# Patient Record
Sex: Male | Born: 1945 | Race: White | Hispanic: No | Marital: Married | Smoking: Former smoker
Health system: Southern US, Community
[De-identification: ages and names within clinical notes are randomized; demographics above are authoritative.]

## PROBLEM LIST (undated history)

## (undated) DIAGNOSIS — IMO0002 Reserved for concepts with insufficient information to code with codable children: Secondary | ICD-10-CM

## (undated) DIAGNOSIS — H409 Unspecified glaucoma: Secondary | ICD-10-CM

## (undated) DIAGNOSIS — G4733 Obstructive sleep apnea (adult) (pediatric): Secondary | ICD-10-CM

## (undated) DIAGNOSIS — E119 Type 2 diabetes mellitus without complications: Secondary | ICD-10-CM

## (undated) DIAGNOSIS — Z87442 Personal history of urinary calculi: Secondary | ICD-10-CM

## (undated) DIAGNOSIS — R609 Edema, unspecified: Secondary | ICD-10-CM

## (undated) DIAGNOSIS — N529 Male erectile dysfunction, unspecified: Secondary | ICD-10-CM

## (undated) DIAGNOSIS — E785 Hyperlipidemia, unspecified: Secondary | ICD-10-CM

## (undated) DIAGNOSIS — E230 Hypopituitarism: Secondary | ICD-10-CM

## (undated) DIAGNOSIS — I872 Venous insufficiency (chronic) (peripheral): Secondary | ICD-10-CM

## (undated) DIAGNOSIS — Z8679 Personal history of other diseases of the circulatory system: Secondary | ICD-10-CM

## (undated) DIAGNOSIS — K7689 Other specified diseases of liver: Secondary | ICD-10-CM

## (undated) DIAGNOSIS — M47816 Spondylosis without myelopathy or radiculopathy, lumbar region: Secondary | ICD-10-CM

## (undated) DIAGNOSIS — K219 Gastro-esophageal reflux disease without esophagitis: Secondary | ICD-10-CM

## (undated) DIAGNOSIS — R197 Diarrhea, unspecified: Secondary | ICD-10-CM

## (undated) DIAGNOSIS — E1169 Type 2 diabetes mellitus with other specified complication: Secondary | ICD-10-CM

## (undated) DIAGNOSIS — I10 Essential (primary) hypertension: Secondary | ICD-10-CM

## (undated) DIAGNOSIS — M1990 Unspecified osteoarthritis, unspecified site: Secondary | ICD-10-CM

## (undated) DIAGNOSIS — J309 Allergic rhinitis, unspecified: Secondary | ICD-10-CM

## (undated) DIAGNOSIS — N400 Enlarged prostate without lower urinary tract symptoms: Secondary | ICD-10-CM

## (undated) HISTORY — DX: Obstructive sleep apnea (adult) (pediatric): G47.33

## (undated) HISTORY — DX: Essential (primary) hypertension: I10

## (undated) HISTORY — DX: Type 2 diabetes mellitus with other specified complication: E11.69

## (undated) HISTORY — DX: Diarrhea, unspecified: R19.7

## (undated) HISTORY — DX: Personal history of other diseases of the circulatory system: Z86.79

## (undated) HISTORY — DX: Unspecified glaucoma: H40.9

## (undated) HISTORY — DX: Male erectile dysfunction, unspecified: N52.9

## (undated) HISTORY — DX: Type 2 diabetes mellitus without complications: E11.9

## (undated) HISTORY — DX: Other specified diseases of liver: K76.89

## (undated) HISTORY — DX: Hypopituitarism: E23.0

## (undated) HISTORY — DX: Unspecified osteoarthritis, unspecified site: M19.90

## (undated) HISTORY — DX: Benign prostatic hyperplasia without lower urinary tract symptoms: N40.0

## (undated) HISTORY — DX: Hyperlipidemia, unspecified: E78.5

## (undated) HISTORY — DX: Allergic rhinitis, unspecified: J30.9

## (undated) HISTORY — DX: Reserved for concepts with insufficient information to code with codable children: IMO0002

## (undated) HISTORY — DX: Spondylosis without myelopathy or radiculopathy, lumbar region: M47.816

## (undated) HISTORY — DX: Gastro-esophageal reflux disease without esophagitis: K21.9

## (undated) HISTORY — DX: Personal history of urinary calculi: Z87.442

## (undated) HISTORY — DX: Venous insufficiency (chronic) (peripheral): I87.2

## (undated) HISTORY — DX: Morbid (severe) obesity due to excess calories: E66.01

## (undated) HISTORY — DX: Edema, unspecified: R60.9

---

## 1998-04-16 ENCOUNTER — Encounter: Admission: RE | Admit: 1998-04-16 | Discharge: 1998-07-15 | Payer: Self-pay | Admitting: Endocrinology

## 1999-02-20 ENCOUNTER — Ambulatory Visit: Admission: RE | Admit: 1999-02-20 | Discharge: 1999-02-20 | Payer: Self-pay | Admitting: Endocrinology

## 1999-07-02 ENCOUNTER — Ambulatory Visit: Admission: RE | Admit: 1999-07-02 | Discharge: 1999-07-02 | Payer: Self-pay | Admitting: Pulmonary Disease

## 2002-05-14 ENCOUNTER — Encounter: Admission: RE | Admit: 2002-05-14 | Discharge: 2002-08-12 | Payer: Self-pay | Admitting: Endocrinology

## 2002-09-17 ENCOUNTER — Encounter: Admission: RE | Admit: 2002-09-17 | Discharge: 2002-12-16 | Payer: Self-pay | Admitting: Endocrinology

## 2002-10-15 ENCOUNTER — Encounter (HOSPITAL_BASED_OUTPATIENT_CLINIC_OR_DEPARTMENT_OTHER): Admission: RE | Admit: 2002-10-15 | Discharge: 2003-01-13 | Payer: Self-pay | Admitting: Internal Medicine

## 2003-01-29 ENCOUNTER — Encounter (HOSPITAL_BASED_OUTPATIENT_CLINIC_OR_DEPARTMENT_OTHER): Admission: RE | Admit: 2003-01-29 | Discharge: 2003-04-29 | Payer: Self-pay | Admitting: Internal Medicine

## 2003-08-03 ENCOUNTER — Ambulatory Visit (HOSPITAL_COMMUNITY): Admission: RE | Admit: 2003-08-03 | Discharge: 2003-08-03 | Payer: Self-pay | Admitting: Endocrinology

## 2004-02-08 ENCOUNTER — Encounter
Admission: RE | Admit: 2004-02-08 | Discharge: 2004-04-20 | Payer: Self-pay | Admitting: Physical Medicine and Rehabilitation

## 2004-03-24 ENCOUNTER — Ambulatory Visit: Payer: Self-pay | Admitting: Physical Medicine and Rehabilitation

## 2004-04-05 ENCOUNTER — Encounter
Admission: RE | Admit: 2004-04-05 | Discharge: 2004-06-13 | Payer: Self-pay | Admitting: Physical Medicine and Rehabilitation

## 2004-04-20 ENCOUNTER — Encounter
Admission: RE | Admit: 2004-04-20 | Discharge: 2004-07-19 | Payer: Self-pay | Admitting: Physical Medicine and Rehabilitation

## 2004-05-11 ENCOUNTER — Encounter: Admission: RE | Admit: 2004-05-11 | Discharge: 2004-05-11 | Payer: Self-pay | Admitting: Endocrinology

## 2004-05-13 ENCOUNTER — Ambulatory Visit: Payer: Self-pay | Admitting: Pulmonary Disease

## 2004-05-17 ENCOUNTER — Ambulatory Visit (HOSPITAL_COMMUNITY): Admission: RE | Admit: 2004-05-17 | Discharge: 2004-05-17 | Payer: Self-pay | Admitting: Pulmonary Disease

## 2004-05-19 ENCOUNTER — Ambulatory Visit: Payer: Self-pay

## 2004-05-25 ENCOUNTER — Ambulatory Visit: Payer: Self-pay | Admitting: Endocrinology

## 2004-05-25 ENCOUNTER — Ambulatory Visit: Payer: Self-pay | Admitting: Physical Medicine and Rehabilitation

## 2004-06-22 ENCOUNTER — Encounter: Admission: RE | Admit: 2004-06-22 | Discharge: 2004-09-20 | Payer: Self-pay | Admitting: Endocrinology

## 2004-07-07 ENCOUNTER — Ambulatory Visit: Payer: Self-pay | Admitting: Endocrinology

## 2004-07-12 ENCOUNTER — Ambulatory Visit: Payer: Self-pay

## 2004-07-14 ENCOUNTER — Ambulatory Visit: Payer: Self-pay

## 2004-07-27 ENCOUNTER — Encounter
Admission: RE | Admit: 2004-07-27 | Discharge: 2004-10-25 | Payer: Self-pay | Admitting: Physical Medicine and Rehabilitation

## 2004-07-28 ENCOUNTER — Ambulatory Visit: Payer: Self-pay | Admitting: Physical Medicine and Rehabilitation

## 2004-09-16 ENCOUNTER — Ambulatory Visit: Payer: Self-pay | Admitting: Physical Medicine and Rehabilitation

## 2004-10-26 ENCOUNTER — Encounter: Admission: RE | Admit: 2004-10-26 | Discharge: 2005-01-24 | Payer: Self-pay | Admitting: Endocrinology

## 2004-11-10 ENCOUNTER — Encounter
Admission: RE | Admit: 2004-11-10 | Discharge: 2005-02-08 | Payer: Self-pay | Admitting: Physical Medicine and Rehabilitation

## 2004-11-11 ENCOUNTER — Ambulatory Visit: Payer: Self-pay | Admitting: Physical Medicine and Rehabilitation

## 2004-12-20 ENCOUNTER — Ambulatory Visit: Payer: Self-pay | Admitting: Physical Medicine and Rehabilitation

## 2005-01-23 ENCOUNTER — Ambulatory Visit: Payer: Self-pay | Admitting: Endocrinology

## 2005-01-31 ENCOUNTER — Ambulatory Visit: Payer: Self-pay | Admitting: Endocrinology

## 2005-02-15 ENCOUNTER — Encounter
Admission: RE | Admit: 2005-02-15 | Discharge: 2005-05-16 | Payer: Self-pay | Admitting: Physical Medicine and Rehabilitation

## 2005-02-27 ENCOUNTER — Ambulatory Visit: Payer: Self-pay | Admitting: Internal Medicine

## 2005-02-28 ENCOUNTER — Ambulatory Visit: Payer: Self-pay | Admitting: Physical Medicine and Rehabilitation

## 2005-03-09 ENCOUNTER — Ambulatory Visit: Payer: Self-pay | Admitting: Gastroenterology

## 2005-03-09 ENCOUNTER — Ambulatory Visit (HOSPITAL_COMMUNITY): Admission: RE | Admit: 2005-03-09 | Discharge: 2005-03-09 | Payer: Self-pay | Admitting: Gastroenterology

## 2005-03-22 ENCOUNTER — Encounter: Admission: RE | Admit: 2005-03-22 | Discharge: 2005-06-20 | Payer: Self-pay | Admitting: Endocrinology

## 2005-03-23 ENCOUNTER — Ambulatory Visit: Payer: Self-pay | Admitting: Endocrinology

## 2005-04-25 ENCOUNTER — Ambulatory Visit: Payer: Self-pay | Admitting: Physical Medicine and Rehabilitation

## 2005-05-17 ENCOUNTER — Encounter
Admission: RE | Admit: 2005-05-17 | Discharge: 2005-08-15 | Payer: Self-pay | Admitting: Physical Medicine & Rehabilitation

## 2005-05-17 ENCOUNTER — Ambulatory Visit: Payer: Self-pay | Admitting: Physical Medicine & Rehabilitation

## 2005-06-12 HISTORY — PX: LITHOTRIPSY: SUR834

## 2005-06-28 ENCOUNTER — Encounter: Admission: RE | Admit: 2005-06-28 | Discharge: 2005-09-26 | Payer: Self-pay | Admitting: Endocrinology

## 2005-07-05 ENCOUNTER — Ambulatory Visit: Payer: Self-pay | Admitting: Physical Medicine & Rehabilitation

## 2005-08-07 ENCOUNTER — Ambulatory Visit: Payer: Self-pay | Admitting: Endocrinology

## 2005-08-09 ENCOUNTER — Ambulatory Visit: Payer: Self-pay | Admitting: Endocrinology

## 2005-08-18 ENCOUNTER — Ambulatory Visit: Payer: Self-pay

## 2005-08-28 ENCOUNTER — Encounter
Admission: RE | Admit: 2005-08-28 | Discharge: 2005-11-26 | Payer: Self-pay | Admitting: Physical Medicine and Rehabilitation

## 2005-08-28 ENCOUNTER — Ambulatory Visit: Payer: Self-pay | Admitting: Physical Medicine and Rehabilitation

## 2005-10-24 ENCOUNTER — Ambulatory Visit: Payer: Self-pay | Admitting: Physical Medicine and Rehabilitation

## 2005-12-20 ENCOUNTER — Encounter
Admission: RE | Admit: 2005-12-20 | Discharge: 2006-03-20 | Payer: Self-pay | Admitting: Physical Medicine and Rehabilitation

## 2005-12-20 ENCOUNTER — Ambulatory Visit: Payer: Self-pay | Admitting: Physical Medicine and Rehabilitation

## 2006-01-30 ENCOUNTER — Ambulatory Visit: Payer: Self-pay | Admitting: Endocrinology

## 2006-02-06 ENCOUNTER — Ambulatory Visit: Payer: Self-pay | Admitting: Endocrinology

## 2006-02-15 ENCOUNTER — Ambulatory Visit: Payer: Self-pay | Admitting: Physical Medicine and Rehabilitation

## 2006-02-20 ENCOUNTER — Ambulatory Visit (HOSPITAL_COMMUNITY): Admission: RE | Admit: 2006-02-20 | Discharge: 2006-02-20 | Payer: Self-pay | Admitting: Internal Medicine

## 2006-02-20 ENCOUNTER — Ambulatory Visit: Payer: Self-pay | Admitting: Internal Medicine

## 2006-02-22 ENCOUNTER — Encounter: Admission: RE | Admit: 2006-02-22 | Discharge: 2006-02-22 | Payer: Self-pay | Admitting: Internal Medicine

## 2006-02-27 ENCOUNTER — Ambulatory Visit: Payer: Self-pay | Admitting: Pulmonary Disease

## 2006-03-16 ENCOUNTER — Encounter: Admission: RE | Admit: 2006-03-16 | Discharge: 2006-03-16 | Payer: Self-pay | Admitting: Urology

## 2006-03-26 ENCOUNTER — Ambulatory Visit (HOSPITAL_COMMUNITY): Admission: RE | Admit: 2006-03-26 | Discharge: 2006-03-26 | Payer: Self-pay | Admitting: Urology

## 2006-04-13 ENCOUNTER — Encounter
Admission: RE | Admit: 2006-04-13 | Discharge: 2006-07-12 | Payer: Self-pay | Admitting: Physical Medicine and Rehabilitation

## 2006-04-13 ENCOUNTER — Ambulatory Visit: Payer: Self-pay | Admitting: Physical Medicine and Rehabilitation

## 2006-05-09 ENCOUNTER — Ambulatory Visit: Payer: Self-pay | Admitting: Endocrinology

## 2006-06-08 ENCOUNTER — Ambulatory Visit: Payer: Self-pay | Admitting: Physical Medicine and Rehabilitation

## 2006-06-19 ENCOUNTER — Ambulatory Visit: Payer: Self-pay | Admitting: Endocrinology

## 2006-07-04 ENCOUNTER — Encounter
Admission: RE | Admit: 2006-07-04 | Discharge: 2006-10-02 | Payer: Self-pay | Admitting: Physical Medicine and Rehabilitation

## 2006-08-17 ENCOUNTER — Ambulatory Visit: Payer: Self-pay | Admitting: Physical Medicine and Rehabilitation

## 2006-08-21 ENCOUNTER — Ambulatory Visit: Payer: Self-pay | Admitting: Endocrinology

## 2006-08-21 LAB — CONVERTED CEMR LAB: Hgb A1c MFr Bld: 7 % — ABNORMAL HIGH (ref 4.6–6.0)

## 2006-10-05 ENCOUNTER — Ambulatory Visit: Payer: Self-pay | Admitting: Physical Medicine and Rehabilitation

## 2006-10-05 ENCOUNTER — Encounter
Admission: RE | Admit: 2006-10-05 | Discharge: 2007-01-03 | Payer: Self-pay | Admitting: Physical Medicine and Rehabilitation

## 2006-10-18 ENCOUNTER — Encounter
Admission: RE | Admit: 2006-10-18 | Discharge: 2007-01-16 | Payer: Self-pay | Admitting: Physical Medicine & Rehabilitation

## 2006-10-22 ENCOUNTER — Ambulatory Visit: Payer: Self-pay | Admitting: Physical Medicine & Rehabilitation

## 2006-12-03 ENCOUNTER — Ambulatory Visit: Payer: Self-pay | Admitting: Physical Medicine and Rehabilitation

## 2006-12-18 ENCOUNTER — Encounter
Admission: RE | Admit: 2006-12-18 | Discharge: 2007-03-18 | Payer: Self-pay | Admitting: Physical Medicine and Rehabilitation

## 2007-01-01 ENCOUNTER — Ambulatory Visit: Payer: Self-pay | Admitting: Endocrinology

## 2007-01-01 LAB — CONVERTED CEMR LAB: Hgb A1c MFr Bld: 7.2 % — ABNORMAL HIGH (ref 4.6–6.0)

## 2007-01-08 ENCOUNTER — Encounter
Admission: RE | Admit: 2007-01-08 | Discharge: 2007-04-08 | Payer: Self-pay | Admitting: Physical Medicine and Rehabilitation

## 2007-01-08 ENCOUNTER — Ambulatory Visit: Payer: Self-pay | Admitting: Physical Medicine and Rehabilitation

## 2007-02-26 ENCOUNTER — Ambulatory Visit: Payer: Self-pay | Admitting: Physical Medicine and Rehabilitation

## 2007-02-28 ENCOUNTER — Ambulatory Visit: Payer: Self-pay | Admitting: Pulmonary Disease

## 2007-04-04 ENCOUNTER — Ambulatory Visit: Payer: Self-pay | Admitting: Physical Medicine and Rehabilitation

## 2007-04-22 ENCOUNTER — Encounter: Payer: Self-pay | Admitting: Endocrinology

## 2007-05-01 ENCOUNTER — Ambulatory Visit: Payer: Self-pay | Admitting: Endocrinology

## 2007-05-01 LAB — CONVERTED CEMR LAB
ALT: 43 units/L (ref 0–53)
AST: 34 units/L (ref 0–37)
Albumin: 3.6 g/dL (ref 3.5–5.2)
Alkaline Phosphatase: 90 units/L (ref 39–117)
BUN: 17 mg/dL (ref 6–23)
Basophils Absolute: 0 10*3/uL (ref 0.0–0.1)
Basophils Relative: 0.3 % (ref 0.0–1.0)
Bilirubin, Direct: 0.1 mg/dL (ref 0.0–0.3)
CO2: 32 meq/L (ref 19–32)
Calcium: 9.4 mg/dL (ref 8.4–10.5)
Chloride: 99 meq/L (ref 96–112)
Cholesterol: 117 mg/dL (ref 0–200)
Creatinine, Ser: 1.3 mg/dL (ref 0.4–1.5)
Eosinophils Absolute: 0.4 10*3/uL (ref 0.0–0.6)
Eosinophils Relative: 3.2 % (ref 0.0–5.0)
GFR calc Af Amer: 72 mL/min
GFR calc non Af Amer: 59 mL/min
Glucose, Bld: 138 mg/dL — ABNORMAL HIGH (ref 70–99)
HCT: 41.1 % (ref 39.0–52.0)
HDL: 33.2 mg/dL — ABNORMAL LOW (ref >39.0)
Hemoglobin: 14.3 g/dL (ref 13.0–17.0)
Hgb A1c MFr Bld: 7.1 % — ABNORMAL HIGH (ref 4.6–6.0)
LDL Cholesterol: 52 mg/dL (ref 0–99)
Lymphocytes Relative: 13.5 % (ref 12.0–46.0)
MCHC: 34.8 g/dL (ref 30.0–36.0)
MCV: 91.5 fL (ref 78.0–100.0)
Monocytes Absolute: 0.8 10*3/uL — ABNORMAL HIGH (ref 0.2–0.7)
Monocytes Relative: 6.1 % (ref 3.0–11.0)
Neutro Abs: 9.4 10*3/uL — ABNORMAL HIGH (ref 1.4–7.7)
Neutrophils Relative %: 76.9 % (ref 43.0–77.0)
PSA: 0.26 ng/mL (ref 0.10–4.00)
Platelets: 327 10*3/uL (ref 150–400)
Potassium: 4.3 meq/L (ref 3.5–5.1)
RBC: 4.5 M/uL (ref 4.22–5.81)
RDW: 13.5 % (ref 11.5–14.6)
Sodium: 140 meq/L (ref 135–145)
TSH: 1.27 microintl units/mL (ref 0.35–5.50)
Total Bilirubin: 0.6 mg/dL (ref 0.3–1.2)
Total CHOL/HDL Ratio: 3.5
Total Protein: 6.8 g/dL (ref 6.0–8.3)
Triglycerides: 161 mg/dL — ABNORMAL HIGH (ref 0–149)
VLDL: 32 mg/dL (ref 0–40)
WBC: 12.3 10*3/uL — ABNORMAL HIGH (ref 4.5–10.5)

## 2007-05-04 ENCOUNTER — Ambulatory Visit: Payer: Self-pay | Admitting: Internal Medicine

## 2007-05-04 DIAGNOSIS — E119 Type 2 diabetes mellitus without complications: Secondary | ICD-10-CM

## 2007-05-04 DIAGNOSIS — M545 Low back pain, unspecified: Secondary | ICD-10-CM | POA: Insufficient documentation

## 2007-05-04 DIAGNOSIS — J029 Acute pharyngitis, unspecified: Secondary | ICD-10-CM

## 2007-05-04 HISTORY — DX: Type 2 diabetes mellitus without complications: E11.9

## 2007-05-04 LAB — CONVERTED CEMR LAB: Rapid Strep: NEGATIVE

## 2007-05-13 ENCOUNTER — Ambulatory Visit: Payer: Self-pay | Admitting: Endocrinology

## 2007-05-16 ENCOUNTER — Encounter
Admission: RE | Admit: 2007-05-16 | Discharge: 2007-08-14 | Payer: Self-pay | Admitting: Physical Medicine and Rehabilitation

## 2007-05-16 ENCOUNTER — Ambulatory Visit: Payer: Self-pay | Admitting: Physical Medicine and Rehabilitation

## 2007-07-16 ENCOUNTER — Ambulatory Visit: Payer: Self-pay | Admitting: Physical Medicine and Rehabilitation

## 2007-07-16 ENCOUNTER — Encounter
Admission: RE | Admit: 2007-07-16 | Discharge: 2007-10-14 | Payer: Self-pay | Admitting: Physical Medicine and Rehabilitation

## 2007-08-22 ENCOUNTER — Encounter: Payer: Self-pay | Admitting: Endocrinology

## 2007-09-10 ENCOUNTER — Ambulatory Visit: Payer: Self-pay | Admitting: Endocrinology

## 2007-09-10 LAB — CONVERTED CEMR LAB: Hgb A1c MFr Bld: 7.3 % — ABNORMAL HIGH (ref 4.6–6.0)

## 2007-09-11 ENCOUNTER — Ambulatory Visit: Payer: Self-pay | Admitting: Physical Medicine and Rehabilitation

## 2007-10-09 ENCOUNTER — Ambulatory Visit: Payer: Self-pay | Admitting: Physical Medicine and Rehabilitation

## 2007-10-21 ENCOUNTER — Encounter: Payer: Self-pay | Admitting: Endocrinology

## 2007-10-28 ENCOUNTER — Ambulatory Visit: Payer: Self-pay | Admitting: Endocrinology

## 2007-10-28 ENCOUNTER — Ambulatory Visit: Payer: Self-pay

## 2007-10-28 DIAGNOSIS — I10 Essential (primary) hypertension: Secondary | ICD-10-CM | POA: Insufficient documentation

## 2007-10-28 DIAGNOSIS — R609 Edema, unspecified: Secondary | ICD-10-CM

## 2007-10-28 HISTORY — DX: Edema, unspecified: R60.9

## 2007-10-28 HISTORY — DX: Essential (primary) hypertension: I10

## 2007-11-18 ENCOUNTER — Encounter
Admission: RE | Admit: 2007-11-18 | Discharge: 2008-02-16 | Payer: Self-pay | Admitting: Physical Medicine and Rehabilitation

## 2007-11-20 ENCOUNTER — Ambulatory Visit: Payer: Self-pay | Admitting: Physical Medicine and Rehabilitation

## 2007-12-09 ENCOUNTER — Ambulatory Visit: Payer: Self-pay | Admitting: Endocrinology

## 2007-12-09 LAB — CONVERTED CEMR LAB: Hgb A1c MFr Bld: 7.4 % — ABNORMAL HIGH (ref 4.6–6.0)

## 2007-12-20 ENCOUNTER — Encounter: Payer: Self-pay | Admitting: Endocrinology

## 2007-12-20 ENCOUNTER — Ambulatory Visit: Payer: Self-pay | Admitting: Physical Medicine and Rehabilitation

## 2008-01-17 ENCOUNTER — Ambulatory Visit: Payer: Self-pay | Admitting: Physical Medicine and Rehabilitation

## 2008-01-20 ENCOUNTER — Telehealth: Payer: Self-pay | Admitting: Endocrinology

## 2008-02-12 ENCOUNTER — Ambulatory Visit: Payer: Self-pay | Admitting: Physical Medicine and Rehabilitation

## 2008-02-20 ENCOUNTER — Telehealth: Payer: Self-pay | Admitting: Endocrinology

## 2008-02-20 ENCOUNTER — Telehealth (INDEPENDENT_AMBULATORY_CARE_PROVIDER_SITE_OTHER): Payer: Self-pay | Admitting: *Deleted

## 2008-02-21 ENCOUNTER — Ambulatory Visit: Payer: Self-pay | Admitting: Endocrinology

## 2008-02-21 DIAGNOSIS — R05 Cough: Secondary | ICD-10-CM

## 2008-02-21 DIAGNOSIS — R059 Cough, unspecified: Secondary | ICD-10-CM | POA: Insufficient documentation

## 2008-03-06 ENCOUNTER — Ambulatory Visit: Payer: Self-pay | Admitting: Pulmonary Disease

## 2008-03-06 DIAGNOSIS — G4733 Obstructive sleep apnea (adult) (pediatric): Secondary | ICD-10-CM | POA: Insufficient documentation

## 2008-03-06 HISTORY — DX: Obstructive sleep apnea (adult) (pediatric): G47.33

## 2008-03-09 ENCOUNTER — Ambulatory Visit: Payer: Self-pay | Admitting: Endocrinology

## 2008-03-10 ENCOUNTER — Encounter
Admission: RE | Admit: 2008-03-10 | Discharge: 2008-06-08 | Payer: Self-pay | Admitting: Physical Medicine and Rehabilitation

## 2008-03-11 ENCOUNTER — Ambulatory Visit: Payer: Self-pay | Admitting: Physical Medicine and Rehabilitation

## 2008-04-09 ENCOUNTER — Telehealth (INDEPENDENT_AMBULATORY_CARE_PROVIDER_SITE_OTHER): Payer: Self-pay | Admitting: *Deleted

## 2008-04-10 ENCOUNTER — Ambulatory Visit: Payer: Self-pay | Admitting: Physical Medicine and Rehabilitation

## 2008-04-21 ENCOUNTER — Telehealth: Payer: Self-pay | Admitting: Endocrinology

## 2008-05-11 ENCOUNTER — Ambulatory Visit: Payer: Self-pay | Admitting: Endocrinology

## 2008-05-11 ENCOUNTER — Ambulatory Visit: Payer: Self-pay | Admitting: Physical Medicine and Rehabilitation

## 2008-05-11 ENCOUNTER — Telehealth: Payer: Self-pay | Admitting: Endocrinology

## 2008-05-12 LAB — CONVERTED CEMR LAB
ALT: 43 units/L (ref 0–53)
AST: 39 units/L — ABNORMAL HIGH (ref 0–37)
Albumin: 3.3 g/dL — ABNORMAL LOW (ref 3.5–5.2)
Alkaline Phosphatase: 82 units/L (ref 39–117)
BUN: 16 mg/dL (ref 6–23)
Basophils Absolute: 0 10*3/uL (ref 0.0–0.1)
Basophils Relative: 0.1 % (ref 0.0–3.0)
Bilirubin Urine: NEGATIVE
Bilirubin, Direct: 0.1 mg/dL (ref 0.0–0.3)
CO2: 30 meq/L (ref 19–32)
Calcium: 9.2 mg/dL (ref 8.4–10.5)
Chloride: 104 meq/L (ref 96–112)
Cholesterol: 137 mg/dL (ref 0–200)
Creatinine, Ser: 1 mg/dL (ref 0.4–1.5)
Creatinine,U: 10 mg/dL
Crystals: NEGATIVE
Eosinophils Absolute: 0.5 10*3/uL (ref 0.0–0.7)
Eosinophils Relative: 4.9 % (ref 0.0–5.0)
GFR calc Af Amer: 97 mL/min
GFR calc non Af Amer: 80 mL/min
Glucose, Bld: 159 mg/dL — ABNORMAL HIGH (ref 70–99)
HCT: 40.9 % (ref 39.0–52.0)
HDL: 38 mg/dL — ABNORMAL LOW (ref >39.0)
Hemoglobin, Urine: NEGATIVE
Hemoglobin: 14.2 g/dL (ref 13.0–17.0)
Hgb A1c MFr Bld: 7.1 % — ABNORMAL HIGH (ref 4.6–6.0)
Ketones, ur: NEGATIVE mg/dL
LDL Cholesterol: 65 mg/dL (ref 0–99)
Lymphocytes Relative: 14.4 % (ref 12.0–46.0)
MCHC: 34.7 g/dL (ref 30.0–36.0)
MCV: 91.8 fL (ref 78.0–100.0)
Microalb Creat Ratio: 20 mg/g (ref 0.0–30.0)
Microalb, Ur: 0.2 mg/dL (ref 0.0–1.9)
Monocytes Absolute: 0.7 10*3/uL (ref 0.1–1.0)
Monocytes Relative: 7 % (ref 3.0–12.0)
Mucus, UA: NEGATIVE
Neutro Abs: 7.4 10*3/uL (ref 1.4–7.7)
Neutrophils Relative %: 73.6 % (ref 43.0–77.0)
Nitrite: NEGATIVE
PSA: 0.29 ng/mL (ref 0.10–4.00)
Platelets: 265 10*3/uL (ref 150–400)
Potassium: 3.9 meq/L (ref 3.5–5.1)
RBC / HPF: NONE SEEN
RBC: 4.46 M/uL (ref 4.22–5.81)
RDW: 13.3 % (ref 11.5–14.6)
Sodium: 143 meq/L (ref 135–145)
Specific Gravity, Urine: 1.01 (ref 1.000–1.03)
Squamous Epithelial / HPF: NEGATIVE /lpf
TSH: 1.55 microintl units/mL (ref 0.35–5.50)
Total Bilirubin: 0.5 mg/dL (ref 0.3–1.2)
Total CHOL/HDL Ratio: 3.6
Total Protein, Urine: NEGATIVE mg/dL
Total Protein: 6.8 g/dL (ref 6.0–8.3)
Triglycerides: 170 mg/dL — ABNORMAL HIGH (ref 0–149)
Uric Acid, Serum: 5.2 mg/dL (ref 4.0–7.8)
Urine Glucose: NEGATIVE mg/dL
Urobilinogen, UA: 0.2 (ref 0.0–1.0)
VLDL: 34 mg/dL (ref 0–40)
WBC: 10.1 10*3/uL (ref 4.5–10.5)
pH: 6 (ref 5.0–8.0)

## 2008-05-19 ENCOUNTER — Ambulatory Visit: Payer: Self-pay | Admitting: Endocrinology

## 2008-05-19 DIAGNOSIS — K7689 Other specified diseases of liver: Secondary | ICD-10-CM

## 2008-05-19 DIAGNOSIS — K7581 Nonalcoholic steatohepatitis (NASH): Secondary | ICD-10-CM

## 2008-05-19 HISTORY — DX: Other specified diseases of liver: K76.89

## 2008-05-22 ENCOUNTER — Telehealth: Payer: Self-pay | Admitting: Endocrinology

## 2008-06-16 ENCOUNTER — Encounter
Admission: RE | Admit: 2008-06-16 | Discharge: 2008-09-07 | Payer: Self-pay | Admitting: Physical Medicine and Rehabilitation

## 2008-06-17 ENCOUNTER — Ambulatory Visit: Payer: Self-pay | Admitting: Physical Medicine and Rehabilitation

## 2008-06-22 ENCOUNTER — Encounter: Payer: Self-pay | Admitting: Endocrinology

## 2008-06-22 ENCOUNTER — Telehealth: Payer: Self-pay | Admitting: Endocrinology

## 2008-07-08 ENCOUNTER — Ambulatory Visit: Payer: Self-pay | Admitting: Internal Medicine

## 2008-07-08 DIAGNOSIS — Z87442 Personal history of urinary calculi: Secondary | ICD-10-CM

## 2008-07-08 DIAGNOSIS — Z8679 Personal history of other diseases of the circulatory system: Secondary | ICD-10-CM

## 2008-07-08 DIAGNOSIS — J309 Allergic rhinitis, unspecified: Secondary | ICD-10-CM | POA: Insufficient documentation

## 2008-07-08 DIAGNOSIS — G609 Hereditary and idiopathic neuropathy, unspecified: Secondary | ICD-10-CM | POA: Insufficient documentation

## 2008-07-08 DIAGNOSIS — E785 Hyperlipidemia, unspecified: Secondary | ICD-10-CM

## 2008-07-08 DIAGNOSIS — N400 Enlarged prostate without lower urinary tract symptoms: Secondary | ICD-10-CM

## 2008-07-08 DIAGNOSIS — H409 Unspecified glaucoma: Secondary | ICD-10-CM | POA: Insufficient documentation

## 2008-07-08 HISTORY — DX: Benign prostatic hyperplasia without lower urinary tract symptoms: N40.0

## 2008-07-08 HISTORY — DX: Allergic rhinitis, unspecified: J30.9

## 2008-07-08 HISTORY — DX: Personal history of other diseases of the circulatory system: Z86.79

## 2008-07-08 HISTORY — DX: Unspecified glaucoma: H40.9

## 2008-07-08 HISTORY — DX: Hyperlipidemia, unspecified: E78.5

## 2008-07-08 HISTORY — DX: Personal history of urinary calculi: Z87.442

## 2008-07-15 ENCOUNTER — Ambulatory Visit: Payer: Self-pay | Admitting: Physical Medicine and Rehabilitation

## 2008-08-14 ENCOUNTER — Ambulatory Visit: Payer: Self-pay | Admitting: Physical Medicine and Rehabilitation

## 2008-08-17 ENCOUNTER — Ambulatory Visit: Payer: Self-pay | Admitting: Endocrinology

## 2008-08-19 ENCOUNTER — Telehealth: Payer: Self-pay | Admitting: Endocrinology

## 2008-09-07 ENCOUNTER — Encounter
Admission: RE | Admit: 2008-09-07 | Discharge: 2008-12-06 | Payer: Self-pay | Admitting: Physical Medicine and Rehabilitation

## 2008-09-10 ENCOUNTER — Ambulatory Visit: Payer: Self-pay | Admitting: Physical Medicine and Rehabilitation

## 2008-09-29 ENCOUNTER — Ambulatory Visit: Payer: Self-pay

## 2008-09-29 ENCOUNTER — Ambulatory Visit: Payer: Self-pay | Admitting: Endocrinology

## 2008-10-05 ENCOUNTER — Telehealth: Payer: Self-pay | Admitting: Endocrinology

## 2008-10-07 ENCOUNTER — Ambulatory Visit: Payer: Self-pay | Admitting: Physical Medicine and Rehabilitation

## 2008-10-30 ENCOUNTER — Telehealth (INDEPENDENT_AMBULATORY_CARE_PROVIDER_SITE_OTHER): Payer: Self-pay | Admitting: *Deleted

## 2008-11-16 ENCOUNTER — Ambulatory Visit: Payer: Self-pay | Admitting: Endocrinology

## 2008-11-16 DIAGNOSIS — E876 Hypokalemia: Secondary | ICD-10-CM

## 2008-11-16 LAB — CONVERTED CEMR LAB
BUN: 20 mg/dL (ref 6–23)
CO2: 35 meq/L — ABNORMAL HIGH (ref 19–32)
Calcium: 9.2 mg/dL (ref 8.4–10.5)
Chloride: 101 meq/L (ref 96–112)
Creatinine, Ser: 1 mg/dL (ref 0.4–1.5)
GFR calc non Af Amer: 80.01 mL/min (ref >60)
Glucose, Bld: 222 mg/dL — ABNORMAL HIGH (ref 70–99)
Hgb A1c MFr Bld: 7.3 % — ABNORMAL HIGH (ref 4.6–6.5)
Potassium: 3.3 meq/L — ABNORMAL LOW (ref 3.5–5.1)
Sodium: 141 meq/L (ref 135–145)

## 2008-11-18 ENCOUNTER — Encounter
Admission: RE | Admit: 2008-11-18 | Discharge: 2009-01-08 | Payer: Self-pay | Admitting: Physical Medicine & Rehabilitation

## 2008-11-23 ENCOUNTER — Ambulatory Visit: Payer: Self-pay | Admitting: Physical Medicine & Rehabilitation

## 2008-11-27 ENCOUNTER — Ambulatory Visit: Payer: Self-pay | Admitting: Physical Medicine and Rehabilitation

## 2009-01-08 ENCOUNTER — Ambulatory Visit: Payer: Self-pay | Admitting: Physical Medicine and Rehabilitation

## 2009-02-12 ENCOUNTER — Encounter
Admission: RE | Admit: 2009-02-12 | Discharge: 2009-05-13 | Payer: Self-pay | Admitting: Physical Medicine and Rehabilitation

## 2009-02-12 ENCOUNTER — Ambulatory Visit: Payer: Self-pay | Admitting: Physical Medicine and Rehabilitation

## 2009-02-18 ENCOUNTER — Telehealth: Payer: Self-pay | Admitting: Endocrinology

## 2009-03-08 ENCOUNTER — Ambulatory Visit: Payer: Self-pay | Admitting: Endocrinology

## 2009-03-08 DIAGNOSIS — I872 Venous insufficiency (chronic) (peripheral): Secondary | ICD-10-CM | POA: Insufficient documentation

## 2009-03-08 HISTORY — DX: Venous insufficiency (chronic) (peripheral): I87.2

## 2009-03-08 LAB — CONVERTED CEMR LAB
BUN: 18 mg/dL (ref 6–23)
CO2: 31 meq/L (ref 19–32)
Calcium: 9.2 mg/dL (ref 8.4–10.5)
Chloride: 101 meq/L (ref 96–112)
Creatinine, Ser: 1 mg/dL (ref 0.4–1.5)
Creatinine,U: 21.6 mg/dL
GFR calc non Af Amer: 79.93 mL/min (ref >60)
Glucose, Bld: 151 mg/dL — ABNORMAL HIGH (ref 70–99)
Hgb A1c MFr Bld: 7.4 % — ABNORMAL HIGH (ref 4.6–6.5)
Microalb Creat Ratio: 9.3 mg/g (ref 0.0–30.0)
Potassium: 3.6 meq/L (ref 3.5–5.1)
Pro B Natriuretic peptide (BNP): 34 pg/mL (ref 0.0–100.0)

## 2009-03-09 ENCOUNTER — Telehealth (INDEPENDENT_AMBULATORY_CARE_PROVIDER_SITE_OTHER): Payer: Self-pay | Admitting: *Deleted

## 2009-03-10 ENCOUNTER — Ambulatory Visit: Payer: Self-pay | Admitting: Physical Medicine and Rehabilitation

## 2009-03-22 ENCOUNTER — Telehealth: Payer: Self-pay | Admitting: Endocrinology

## 2009-04-15 ENCOUNTER — Encounter (INDEPENDENT_AMBULATORY_CARE_PROVIDER_SITE_OTHER): Payer: Self-pay | Admitting: *Deleted

## 2009-04-16 ENCOUNTER — Ambulatory Visit: Payer: Self-pay | Admitting: Physical Medicine and Rehabilitation

## 2009-04-16 ENCOUNTER — Ambulatory Visit: Payer: Self-pay

## 2009-04-16 ENCOUNTER — Ambulatory Visit: Payer: Self-pay | Admitting: Endocrinology

## 2009-04-20 ENCOUNTER — Telehealth: Payer: Self-pay | Admitting: Endocrinology

## 2009-04-22 ENCOUNTER — Encounter: Payer: Self-pay | Admitting: Internal Medicine

## 2009-04-22 ENCOUNTER — Ambulatory Visit: Payer: Self-pay | Admitting: Vascular Surgery

## 2009-05-13 ENCOUNTER — Encounter
Admission: RE | Admit: 2009-05-13 | Discharge: 2009-06-09 | Payer: Self-pay | Admitting: Physical Medicine and Rehabilitation

## 2009-05-14 ENCOUNTER — Ambulatory Visit: Payer: Self-pay | Admitting: Physical Medicine and Rehabilitation

## 2009-05-14 ENCOUNTER — Ambulatory Visit: Payer: Self-pay | Admitting: Endocrinology

## 2009-05-17 LAB — CONVERTED CEMR LAB
ALT: 54 units/L — ABNORMAL HIGH (ref 0–53)
AST: 48 units/L — ABNORMAL HIGH (ref 0–37)
Albumin: 3.4 g/dL — ABNORMAL LOW (ref 3.5–5.2)
Alkaline Phosphatase: 92 units/L (ref 39–117)
BUN: 16 mg/dL (ref 6–23)
Basophils Absolute: 0 10*3/uL (ref 0.0–0.1)
Basophils Relative: 0.2 % (ref 0.0–3.0)
Bilirubin Urine: NEGATIVE
Bilirubin, Direct: 0.1 mg/dL (ref 0.0–0.3)
CO2: 33 meq/L — ABNORMAL HIGH (ref 19–32)
Calcium: 9.1 mg/dL (ref 8.4–10.5)
Chloride: 97 meq/L (ref 96–112)
Cholesterol: 124 mg/dL (ref 0–200)
Creatinine, Ser: 1.1 mg/dL (ref 0.4–1.5)
Eosinophils Absolute: 0.5 10*3/uL (ref 0.0–0.7)
Eosinophils Relative: 3.9 % (ref 0.0–5.0)
GFR calc non Af Amer: 71.56 mL/min (ref >60)
Glucose, Bld: 127 mg/dL — ABNORMAL HIGH (ref 70–99)
HCT: 41.6 % (ref 39.0–52.0)
HDL: 35.6 mg/dL — ABNORMAL LOW (ref >39.00)
Hemoglobin: 14.1 g/dL (ref 13.0–17.0)
Hgb A1c MFr Bld: 7.4 % — ABNORMAL HIGH (ref 4.6–6.5)
Ketones, ur: NEGATIVE mg/dL
LDL Cholesterol: 50 mg/dL (ref 0–99)
Leukocytes, UA: NEGATIVE
Lymphocytes Relative: 12.3 % (ref 12.0–46.0)
Lymphs Abs: 1.5 10*3/uL (ref 0.7–4.0)
MCHC: 33.8 g/dL (ref 30.0–36.0)
MCV: 93.4 fL (ref 78.0–100.0)
Monocytes Absolute: 0.6 10*3/uL (ref 0.1–1.0)
Monocytes Relative: 5.4 % (ref 3.0–12.0)
Neutro Abs: 9.2 10*3/uL — ABNORMAL HIGH (ref 1.4–7.7)
Neutrophils Relative %: 78.2 % — ABNORMAL HIGH (ref 43.0–77.0)
Nitrite: NEGATIVE
PSA: 0.25 ng/mL (ref 0.10–4.00)
Platelets: 251 10*3/uL (ref 150.0–400.0)
Potassium: 3.8 meq/L (ref 3.5–5.1)
RBC: 4.46 M/uL (ref 4.22–5.81)
RDW: 14 % (ref 11.5–14.6)
Sodium: 141 meq/L (ref 135–145)
Specific Gravity, Urine: <=1.005 (ref 1.000–1.030)
TSH: 1.83 microintl units/mL (ref 0.35–5.50)
Total Bilirubin: 0.7 mg/dL (ref 0.3–1.2)
Total CHOL/HDL Ratio: 3
Total Protein, Urine: NEGATIVE mg/dL
Total Protein: 7.2 g/dL (ref 6.0–8.3)
Triglycerides: 194 mg/dL — ABNORMAL HIGH (ref 0.0–149.0)
Urine Glucose: NEGATIVE mg/dL
Urobilinogen, UA: 0.2 (ref 0.0–1.0)
VLDL: 38.8 mg/dL (ref 0.0–40.0)
WBC: 11.8 10*3/uL — ABNORMAL HIGH (ref 4.5–10.5)
pH: 5.5 (ref 5.0–8.0)

## 2009-05-18 ENCOUNTER — Ambulatory Visit: Payer: Self-pay | Admitting: Gastroenterology

## 2009-05-24 ENCOUNTER — Ambulatory Visit: Payer: Self-pay | Admitting: Endocrinology

## 2009-05-24 DIAGNOSIS — M1990 Unspecified osteoarthritis, unspecified site: Secondary | ICD-10-CM | POA: Insufficient documentation

## 2009-05-24 HISTORY — DX: Unspecified osteoarthritis, unspecified site: M19.90

## 2009-06-07 ENCOUNTER — Ambulatory Visit: Payer: Self-pay | Admitting: Pulmonary Disease

## 2009-06-09 ENCOUNTER — Ambulatory Visit: Payer: Self-pay | Admitting: Physical Medicine and Rehabilitation

## 2009-06-16 ENCOUNTER — Telehealth (INDEPENDENT_AMBULATORY_CARE_PROVIDER_SITE_OTHER): Payer: Self-pay | Admitting: *Deleted

## 2009-06-25 ENCOUNTER — Encounter: Payer: Self-pay | Admitting: Endocrinology

## 2009-07-01 ENCOUNTER — Encounter
Admission: RE | Admit: 2009-07-01 | Discharge: 2009-09-29 | Payer: Self-pay | Admitting: Physical Medicine and Rehabilitation

## 2009-07-05 ENCOUNTER — Ambulatory Visit: Payer: Self-pay | Admitting: Physical Medicine and Rehabilitation

## 2009-08-04 ENCOUNTER — Ambulatory Visit: Payer: Self-pay | Admitting: Physical Medicine and Rehabilitation

## 2009-08-23 ENCOUNTER — Ambulatory Visit: Payer: Self-pay | Admitting: Endocrinology

## 2009-08-23 DIAGNOSIS — E1169 Type 2 diabetes mellitus with other specified complication: Secondary | ICD-10-CM

## 2009-08-23 DIAGNOSIS — N529 Male erectile dysfunction, unspecified: Secondary | ICD-10-CM | POA: Insufficient documentation

## 2009-08-23 DIAGNOSIS — E291 Testicular hypofunction: Secondary | ICD-10-CM

## 2009-08-23 HISTORY — DX: Type 2 diabetes mellitus with other specified complication: E11.69

## 2009-08-23 HISTORY — DX: Male erectile dysfunction, unspecified: N52.9

## 2009-08-23 LAB — CONVERTED CEMR LAB
CO2: 33 meq/L — ABNORMAL HIGH (ref 19–32)
Calcium: 9.2 mg/dL (ref 8.4–10.5)
Chloride: 104 meq/L (ref 96–112)
Creatinine, Ser: 1 mg/dL (ref 0.4–1.5)
GFR calc non Af Amer: 79.81 mL/min (ref >60)
Glucose, Bld: 96 mg/dL (ref 70–99)
Potassium: 3.4 meq/L — ABNORMAL LOW (ref 3.5–5.1)
Pro B Natriuretic peptide (BNP): 53 pg/mL (ref 0.0–100.0)
Sodium: 144 meq/L (ref 135–145)
Testosterone: 71.2 ng/dL — ABNORMAL LOW (ref 350.00–890.00)

## 2009-08-25 ENCOUNTER — Telehealth (INDEPENDENT_AMBULATORY_CARE_PROVIDER_SITE_OTHER): Payer: Self-pay | Admitting: *Deleted

## 2009-09-06 ENCOUNTER — Ambulatory Visit: Payer: Self-pay | Admitting: Physical Medicine and Rehabilitation

## 2009-10-11 ENCOUNTER — Encounter
Admission: RE | Admit: 2009-10-11 | Discharge: 2010-01-09 | Payer: Self-pay | Admitting: Physical Medicine and Rehabilitation

## 2009-10-13 ENCOUNTER — Ambulatory Visit: Payer: Self-pay | Admitting: Physical Medicine and Rehabilitation

## 2009-11-03 ENCOUNTER — Encounter: Payer: Self-pay | Admitting: Endocrinology

## 2009-11-10 ENCOUNTER — Ambulatory Visit: Payer: Self-pay | Admitting: Physical Medicine and Rehabilitation

## 2009-11-10 ENCOUNTER — Ambulatory Visit: Payer: Self-pay | Admitting: Endocrinology

## 2009-11-14 LAB — CONVERTED CEMR LAB
BUN: 20 mg/dL (ref 6–23)
CO2: 35 meq/L — ABNORMAL HIGH (ref 19–32)
Calcium: 8.9 mg/dL (ref 8.4–10.5)
Chloride: 96 meq/L (ref 96–112)
Creatinine, Ser: 1 mg/dL (ref 0.4–1.5)
FSH: 8.5 milliintl units/mL (ref 1.4–18.1)
GFR calc non Af Amer: 77.96 mL/min (ref >60)
Glucose, Bld: 63 mg/dL — ABNORMAL LOW (ref 70–99)
LH: 6.28 milliintl units/mL (ref 1.50–9.30)
Potassium: 3.8 meq/L (ref 3.5–5.1)
Prolactin: 19.5 ng/mL
Sodium: 142 meq/L (ref 135–145)
Testosterone: 88.31 ng/dL — ABNORMAL LOW (ref 350.00–890.00)

## 2009-11-29 ENCOUNTER — Ambulatory Visit: Payer: Self-pay | Admitting: Endocrinology

## 2009-11-29 DIAGNOSIS — E230 Hypopituitarism: Secondary | ICD-10-CM

## 2009-11-29 HISTORY — DX: Hypopituitarism: E23.0

## 2009-12-01 ENCOUNTER — Encounter: Payer: Self-pay | Admitting: Endocrinology

## 2009-12-07 ENCOUNTER — Encounter: Admission: RE | Admit: 2009-12-07 | Discharge: 2009-12-07 | Payer: Self-pay | Admitting: Endocrinology

## 2009-12-15 ENCOUNTER — Ambulatory Visit: Payer: Self-pay | Admitting: Physical Medicine and Rehabilitation

## 2009-12-27 ENCOUNTER — Telehealth: Payer: Self-pay | Admitting: Endocrinology

## 2009-12-30 ENCOUNTER — Ambulatory Visit: Payer: Self-pay | Admitting: Endocrinology

## 2009-12-30 LAB — CONVERTED CEMR LAB
Hgb A1c MFr Bld: 7.4 % — ABNORMAL HIGH (ref 4.6–6.5)
Testosterone: 205.96 ng/dL — ABNORMAL LOW (ref 350.00–890.00)

## 2010-01-07 ENCOUNTER — Encounter
Admission: RE | Admit: 2010-01-07 | Discharge: 2010-01-07 | Payer: Self-pay | Admitting: Physical Medicine & Rehabilitation

## 2010-01-07 ENCOUNTER — Encounter
Admission: RE | Admit: 2010-01-07 | Discharge: 2010-02-03 | Payer: Self-pay | Admitting: Physical Medicine and Rehabilitation

## 2010-01-12 ENCOUNTER — Ambulatory Visit: Payer: Self-pay | Admitting: Physical Medicine and Rehabilitation

## 2010-01-14 ENCOUNTER — Encounter: Payer: Self-pay | Admitting: Gastroenterology

## 2010-02-03 ENCOUNTER — Encounter
Admission: RE | Admit: 2010-02-03 | Discharge: 2010-05-04 | Payer: Self-pay | Admitting: Physical Medicine and Rehabilitation

## 2010-02-11 ENCOUNTER — Ambulatory Visit: Payer: Self-pay | Admitting: Physical Medicine and Rehabilitation

## 2010-02-18 ENCOUNTER — Telehealth: Payer: Self-pay | Admitting: Endocrinology

## 2010-03-16 ENCOUNTER — Ambulatory Visit: Payer: Self-pay | Admitting: Physical Medicine and Rehabilitation

## 2010-03-31 ENCOUNTER — Ambulatory Visit: Payer: Self-pay | Admitting: Endocrinology

## 2010-03-31 LAB — CONVERTED CEMR LAB
BUN: 14 mg/dL (ref 6–23)
CO2: 34 meq/L — ABNORMAL HIGH (ref 19–32)
Calcium: 9.1 mg/dL (ref 8.4–10.5)
Chloride: 101 meq/L (ref 96–112)
Creatinine, Ser: 0.9 mg/dL (ref 0.4–1.5)
GFR calc non Af Amer: 89.96 mL/min (ref >60)
Glucose, Bld: 69 mg/dL — ABNORMAL LOW (ref 70–99)
Hgb A1c MFr Bld: 7.5 % — ABNORMAL HIGH (ref 4.6–6.5)
Potassium: 4.4 meq/L (ref 3.5–5.1)
Sodium: 142 meq/L (ref 135–145)
Testosterone: 233.64 ng/dL — ABNORMAL LOW (ref 350.00–890.00)

## 2010-04-13 ENCOUNTER — Encounter: Payer: Self-pay | Admitting: Endocrinology

## 2010-04-13 ENCOUNTER — Ambulatory Visit: Payer: Self-pay | Admitting: Physical Medicine and Rehabilitation

## 2010-05-09 ENCOUNTER — Encounter
Admission: RE | Admit: 2010-05-09 | Discharge: 2010-06-08 | Payer: Self-pay | Source: Home / Self Care | Attending: Physical Medicine and Rehabilitation | Admitting: Physical Medicine and Rehabilitation

## 2010-05-16 ENCOUNTER — Ambulatory Visit: Payer: Self-pay | Admitting: Physical Medicine and Rehabilitation

## 2010-05-25 ENCOUNTER — Encounter: Payer: Self-pay | Admitting: Endocrinology

## 2010-06-13 ENCOUNTER — Encounter
Admission: RE | Admit: 2010-06-13 | Discharge: 2010-07-11 | Payer: Self-pay | Source: Home / Self Care | Attending: Physical Medicine and Rehabilitation | Admitting: Physical Medicine and Rehabilitation

## 2010-06-14 ENCOUNTER — Ambulatory Visit
Admission: RE | Admit: 2010-06-14 | Discharge: 2010-06-14 | Payer: Self-pay | Source: Home / Self Care | Attending: Physical Medicine and Rehabilitation | Admitting: Physical Medicine and Rehabilitation

## 2010-06-22 ENCOUNTER — Ambulatory Visit: Admit: 2010-06-22 | Payer: Self-pay | Admitting: Vascular Surgery

## 2010-06-27 ENCOUNTER — Ambulatory Visit (HOSPITAL_COMMUNITY)
Admission: RE | Admit: 2010-06-27 | Discharge: 2010-06-27 | Payer: Self-pay | Source: Home / Self Care | Attending: Physical Medicine and Rehabilitation | Admitting: Physical Medicine and Rehabilitation

## 2010-07-06 ENCOUNTER — Encounter: Payer: Self-pay | Admitting: Endocrinology

## 2010-07-06 ENCOUNTER — Other Ambulatory Visit: Payer: Self-pay | Admitting: Endocrinology

## 2010-07-06 ENCOUNTER — Ambulatory Visit
Admission: RE | Admit: 2010-07-06 | Discharge: 2010-07-06 | Payer: Self-pay | Source: Home / Self Care | Attending: Endocrinology | Admitting: Endocrinology

## 2010-07-06 DIAGNOSIS — K219 Gastro-esophageal reflux disease without esophagitis: Secondary | ICD-10-CM | POA: Insufficient documentation

## 2010-07-06 DIAGNOSIS — R7989 Other specified abnormal findings of blood chemistry: Secondary | ICD-10-CM | POA: Insufficient documentation

## 2010-07-06 HISTORY — DX: Gastro-esophageal reflux disease without esophagitis: K21.9

## 2010-07-06 LAB — HEPATIC FUNCTION PANEL
ALT: 36 U/L (ref 0–53)
AST: 42 U/L — ABNORMAL HIGH (ref 0–37)
Alkaline Phosphatase: 74 U/L (ref 39–117)
Bilirubin, Direct: 0.1 mg/dL (ref 0.0–0.3)
Total Bilirubin: 0.4 mg/dL (ref 0.3–1.2)
Total Protein: 7.1 g/dL (ref 6.0–8.3)

## 2010-07-06 LAB — CBC WITH DIFFERENTIAL/PLATELET
Basophils Relative: 0.3 % (ref 0.0–3.0)
Eosinophils Relative: 4.1 % (ref 0.0–5.0)
Hemoglobin: 14.2 g/dL (ref 13.0–17.0)
Lymphocytes Relative: 11.6 % — ABNORMAL LOW (ref 12.0–46.0)
Lymphs Abs: 1.6 10*3/uL (ref 0.7–4.0)
MCHC: 33.4 g/dL (ref 30.0–36.0)
MCV: 94 fl (ref 78.0–100.0)
Monocytes Absolute: 0.9 10*3/uL (ref 0.1–1.0)
Monocytes Relative: 6.4 % (ref 3.0–12.0)
Neutro Abs: 10.9 10*3/uL — ABNORMAL HIGH (ref 1.4–7.7)
Neutrophils Relative %: 77.6 % — ABNORMAL HIGH (ref 43.0–77.0)
Platelets: 290 10*3/uL (ref 150.0–400.0)
RBC: 4.52 Mil/uL (ref 4.22–5.81)
RDW: 15.4 % — ABNORMAL HIGH (ref 11.5–14.6)
WBC: 14.1 10*3/uL — ABNORMAL HIGH (ref 4.5–10.5)

## 2010-07-06 LAB — TSH: TSH: 1.96 u[IU]/mL (ref 0.35–5.50)

## 2010-07-06 LAB — LIPID PANEL
HDL: 30.2 mg/dL — ABNORMAL LOW (ref >39.00)
LDL Cholesterol: 51 mg/dL (ref 0–99)
Total CHOL/HDL Ratio: 4
Triglycerides: 163 mg/dL — ABNORMAL HIGH (ref 0.0–149.0)
VLDL: 32.6 mg/dL (ref 0.0–40.0)

## 2010-07-06 LAB — BASIC METABOLIC PANEL
BUN: 16 mg/dL (ref 6–23)
Calcium: 9.2 mg/dL (ref 8.4–10.5)
Chloride: 96 mEq/L (ref 96–112)
Creatinine, Ser: 1.1 mg/dL (ref 0.4–1.5)
GFR: 69.13 mL/min (ref >60.00)
Glucose, Bld: 87 mg/dL (ref 70–99)
Potassium: 3.9 mEq/L (ref 3.5–5.1)
Sodium: 142 mEq/L (ref 135–145)

## 2010-07-06 LAB — URINALYSIS, ROUTINE W REFLEX MICROSCOPIC
Bilirubin Urine: NEGATIVE
Ketones, ur: NEGATIVE
Nitrite: NEGATIVE
Total Protein, Urine: NEGATIVE
Urine Glucose: NEGATIVE
pH: 6 (ref 5.0–8.0)

## 2010-07-06 LAB — URIC ACID: Uric Acid, Serum: 4.7 mg/dL (ref 4.0–7.8)

## 2010-07-06 LAB — TESTOSTERONE: Testosterone: 204.41 ng/dL — ABNORMAL LOW (ref 350.00–890.00)

## 2010-07-06 LAB — PSA: PSA: 0.45 ng/mL (ref 0.10–4.00)

## 2010-07-06 LAB — MICROALBUMIN / CREATININE URINE RATIO
Creatinine,U: 25.4 mg/dL
Microalb Creat Ratio: 2 mg/g (ref 0.0–30.0)
Microalb, Ur: 0.5 mg/dL (ref 0.0–1.9)

## 2010-07-06 LAB — HEMOGLOBIN A1C: Hgb A1c MFr Bld: 7.6 % — ABNORMAL HIGH (ref 4.6–6.5)

## 2010-07-10 LAB — CONVERTED CEMR LAB
BUN: 21 mg/dL (ref 6–23)
CO2: 32 meq/L (ref 19–32)
Calcium: 9.5 mg/dL (ref 8.4–10.5)
Chloride: 102 meq/L (ref 96–112)
Creatinine, Ser: 1.3 mg/dL (ref 0.4–1.5)
GFR calc non Af Amer: 59.13 mL/min (ref >60)
Glucose, Bld: 121 mg/dL — ABNORMAL HIGH (ref 70–99)
Potassium: 4.2 meq/L (ref 3.5–5.1)
Pro B Natriuretic peptide (BNP): 27 pg/mL (ref 0.0–100.0)
Sodium: 142 meq/L (ref 135–145)

## 2010-07-11 ENCOUNTER — Ambulatory Visit
Admission: RE | Admit: 2010-07-11 | Discharge: 2010-07-11 | Payer: Self-pay | Source: Home / Self Care | Attending: Physical Medicine and Rehabilitation | Admitting: Physical Medicine and Rehabilitation

## 2010-07-12 NOTE — Letter (Signed)
Summary: [facility]  [facility]   Imported By: Sherian Rein 07/07/2009 11:06:17  _____________________________________________________________________  External Attachment:    Type:   Image     Comment:   External Document

## 2010-07-12 NOTE — Assessment & Plan Note (Signed)
Summary: 3 MTH FU  STC   Vital Signs:  Patient profile:   65 year old male Height:      67 inches (170.18 cm) Weight:      356 pounds (161.82 kg) BMI:     55.96 O2 Sat:      89 % on Room air Temp:     97.4 degrees F (36.33 degrees C) oral Pulse rate:   107 / minute BP sitting:   138 / 76  (left arm) Cuff size:   large  Vitals Entered By: Josph Macho RMA (August 23, 2009 1:28 PM)  O2 Flow:  Room air CC: 3 month follow up/ pt has questions about oxygen/ CF Is Patient Diabetic? Yes   Referring Provider:  Alice Rieger Primary Provider:  [name] MD  CC:  3 month follow up/ pt has questions about oxygen/ CF.  History of Present Illness: pt states 1 month of moderate "weeping" from the left leg, and associated ulcer there. no change in chronic doe. pt states few years of erectile dysfunction. no cbg record, but states cbg's are highest at hs (sometimes 200's).    Current Medications (verified): 1)  Metformin Hcl 500 Mg Tb24 (Metformin Hcl) .... Take 2 Tablet By Mouth Twice A Day 2)  Lovastatin 40 Mg  Tabs (Lovastatin) .... Take 2 By Mouth Qhs 3)  Glyset 50 Mg  Tabs (Miglitol) .... Take 1 By Mouth Three Times A Day Qd 4)  Allopurinol 300 Mg  Tabs (Allopurinol) .... Take 1 By Mouth Qd 5)  Morphine Sulfate Cr 15 Mg  Tb12 (Morphine Sulfate) .... Take 1 By Mouth Two Times A Day Qd 6)  Topamax 50 Mg  Tabs (Topiramate) .... Take 1 By Mouth Two Times A Day 7)  Endocet 7.5-500 Mg  Tabs (Oxycodone-Acetaminophen) .... Take 1 By Mouth Three Times A Day Once Daily Prn 8)  Proventil Hfa 108 (90 Base) Mcg/act  Aers (Albuterol Sulfate) .Marland Kitchen.. 1 Puff Q4h Prn 9)  Claritin-D 12 Hour 5-120 Mg  Tb12 (Loratadine-Pseudoephedrine) .... Take 1 Tablet By Mouth Two Times A Day 10)  Novofine 30g X 8 Mm  Misc (Insulin Pen Needle) .... Use Qid Qd 11)  Zantac 150 Mg  Tabs (Ranitidine Hcl) .... Two Times A Day As Needed Heartburn 12)  Onetouch Test   Strp (Glucose Blood) .... Check Blood Sugar Qid 13)   Furosemide 80 Mg  Tabs (Furosemide) .... Take 1 By Mouth Qd 14)  Ticlopidine Hcl 250 Mg Tabs (Ticlopidine Hcl) .... Bid 15)  Miralax  Powd (Polyethylene Glycol 3350) .Marland KitchenMarland KitchenMarland Kitchen 17 Grams Qd 16)  Lidoderm 5 % Ptch (Lidocaine) .... Qd 17)  Triamcinolone Acetonide 0.1 % Crea (Triamcinolone Acetonide) .... Three Times A Day As Needed Rash 18)  Bd Insulin Syringe Ultrafine 30g X 1/2" 1 Ml Misc (Insulin Syringe-Needle U-100) .... Use As Directed 19)  Zolpidem Tartrate 10 Mg Tabs (Zolpidem Tartrate) .... At [facility] As Needed Sleep 20)  Hyzaar 100-25 Mg Tabs (Losartan Potassium-Hctz) .... Qd 21)  Ranitidine Hcl 300 Mg Tabs (Ranitidine Hcl) .... Take 1 Two Times A Day Prn 22)  Klor-Con M20 20 Meq Cr-Tabs (Potassium Chloride Crys Cr) .Marland Kitchen.. 1 Qd 23)  Cefuroxime Axetil 500 Mg Tabs (Cefuroxime Axetil) .Marland Kitchen.. 1 Bid 24)  Diltiazem Hcl Er Beads 360 Mg Xr24h-Cap (Diltiazem Hcl Er Beads) .Marland Kitchen.. 1 Qd 25)  Humalog Kwikpen 100 Unit/ml Soln (Insulin Lispro (Human)) .... (Qac) 80-100-120-20 Units 26)  Humulin N Pen 100 Unit/ml Susp (Insulin Isophane Human) .Marland Kitchen.. 120  Units Qhs  Allergies (verified): No Known Drug Allergies  Past History:  Past Medical History: Last updated: 05/18/2009 gerd EDEMA (ICD-782.3) SORE THROAT (ICD-462) DIABETES MELLITUS, TYPE II (ICD-250.00) LOW BACK PAIN (ICD-724.2) AODM (ICD-250.00) SPECIAL SCREENING MALIGNANT NEOPLASM OF PROSTATE (ICD-V76.44) ROUTINE GENERAL MEDICAL EXAM@HEALTH  CARE FACL (ICD-V70.0) Hyperlipidemia Allergic rhinitis gluacoma Cerebrovascular accident, hx of OSA DM nephropathy Peripheral neuropathy Nephrolithiasis, hx of chronic venous insufficiency Hypertension Benign prostatic hypertrophy morbid obesity  Review of Systems  The patient denies chest pain.         denies hypoglycemia  Physical Exam  General:  morbidly obese.  no distress  Extremities:  left leg: has erythema, but no warmth/tend.1+ right pedal edema and 3+ left pedal edema.  there is a clean,  shallow ulcer on the left leg (2x5 cm). Additional Exam:  Testosterone         [L]  71.20 ng/dL    Impression & Recommendations:  Problem # 1:  DIABETIC ULCER, LEFT LEG (ICD-250.80) Assessment New  Problem # 2:  ERECTILE DYSFUNCTION, ORGANIC (ICD-607.84) prob due to #4  Problem # 3:  DIABETES MELLITUS, TYPE II (ICD-250.00) needs increased rx  Problem # 4:  HYPOGONADISM (ICD-257.2) uncertain etiology  Problem # 5:  HYPOKALEMIA (ICD-276.8) needs increased rx  Medications Added to Medication List This Visit: 1)  Klor-con M20 20 Meq Cr-tabs (Potassium chloride crys cr) .Marland Kitchen.. 1 two times a day  Other Orders: Wound Care Center Referral (Wound Care) TLB-BMP (Basic Metabolic Panel-BMET) (80048-METABOL) TLB-BNP (B-Natriuretic Peptide) (83880-BNPR) TLB-Testosterone, Total (84403-TESTO) Est. Patient Level IV ([address])  Patient Instructions: 1)  refer wound care.  you will be called with a day and time for an appointment 2)  cc dr Merrie Roof 3)  tests are being ordered for you today.  a few days after the test(s), please call [phone] to hear your test results. 4)  pending the test results, please continue the same medications for now. 5)  keep the left leg covered with antibiotic ointment and a large bandaid. 6)  Please schedule a follow-up appointment in 3 months. 7)  (update: i left message on phone-tree:  increase klor to 20 meq two times a day.  go to lab in 1-2 weeks for bmet  testosterone fsh  lh prolactin--or you could wait until next ov). Prescriptions: KLOR-CON M20 20 MEQ CR-TABS (POTASSIUM CHLORIDE CRYS CR) 1 two times a day  #60 x 11   Entered and Authorized by:   Minus Breeding MD   Signed by:   Minus Breeding MD on 08/23/2009   Method used:   Electronically to        [facility] (retail)       [phone] [address]       [place], [place]  [id number]       Ph: [phone]       Fax: [phone]   RxID:   [id number]

## 2010-07-12 NOTE — Letter (Signed)
Summary: Colonoscopy Letter  [facility]  [address]   [place], [place] [address]   Phone: [phone]  Fax: [phone]      January 14, 2010 MRN: [id number]   Blake Summers [address] [place], [place]  [address]   Dear [name],   According to your medical record, it is time for you to schedule a Colonoscopy. The American Cancer Society recommends this procedure as a method to detect early colon cancer. Patients with a family history of colon cancer, or a personal history of colon polyps or inflammatory bowel disease are at increased risk.  This letter has been generated based on the recommendations made at the time of your procedure. If you feel that in your particular situation this may no longer apply, please contact our office.  Please call our office at [phone] to schedule this appointment or to update your records at your earliest convenience.  Thank you for cooperating with us to provide you with the very best care possible.   Sincerely,  Daniel P. Jacobs, M.D.  [facility] [phone] 

## 2010-07-12 NOTE — Progress Notes (Signed)
Summary: wound ctr referral  Phone Note Outgoing Call   Call placed by: Dagoberto Reef,  August 25, 2009 1:56 PM Summary of Call: Dr Everardo All,  i need ov complete to send with pt  referral.   Thanks Initial call taken by: Dagoberto Reef,  August 25, 2009 1:56 PM  Follow-up for Phone Call        done Follow-up by: Minus Breeding MD,  August 25, 2009 5:30 PM

## 2010-07-12 NOTE — Letter (Signed)
Summary: [facility]   Imported By: Sherian Rein 11/30/2009 11:31:32  _____________________________________________________________________  External Attachment:    Type:   Image     Comment:   External Document

## 2010-07-12 NOTE — Letter (Signed)
Summary: Colonoscopy Letter  [facility]  [address] [place], [place] [address]   Phone: [phone]  Fax: [phone]      January 14, 2010 MRN: [id number]   Blake Summers [address] [place], [place]  [address]   Dear Mr. STAILEY,   According to your medical record, it is time for you to schedule a Colonoscopy. The American Cancer Society recommends this procedure as a method to detect early colon cancer. Patients with a family history of colon cancer, or a personal history of colon polyps or inflammatory bowel disease are at increased risk.  This letter has been generated based on the recommendations made at the time of your procedure. If you feel that in your particular situation this may no longer apply, please contact our office.  Please call our office at [phone] to schedule this appointment or to update your records at your earliest convenience.  Thank you for cooperating with [place] to provide you with the very best care possible.   Sincerely,  Rachael Fee, M.D.  [facility] [phone]

## 2010-07-12 NOTE — Letter (Signed)
Summary: [facility]   Imported By: Lester Lawai 04/15/2010 08:27:54  _____________________________________________________________________  External Attachment:    Type:   Image     Comment:   External Document

## 2010-07-12 NOTE — Progress Notes (Signed)
Summary: Zolpidem  Phone Note Refill Request Message from:  Fax from Pharmacy  Refills Requested: Medication #1:  ZOLPIDEM TARTRATE 10 MG TABS at hs as needed sleep   Dosage confirmed as above?Dosage Confirmed  Method Requested: Fax to Local Pharmacy Initial call taken by: Brenton Grills MA,  December 27, 2009 11:11 AM  Follow-up for Phone Call        i printed Follow-up by: Minus Breeding MD,  December 27, 2009 12:04 PM    Prescriptions: ZOLPIDEM TARTRATE 10 MG TABS (ZOLPIDEM TARTRATE) at hs as needed sleep  #30 x 5   Entered and Authorized by:   Minus Breeding MD   Signed by:   Minus Breeding MD on 12/27/2009   Method used:   Print then Give to Patient   RxID:   [id number]  Faxed to [facility]--[phone]/ Brenton Grills MA  December 27, 2009 3:29 PM

## 2010-07-12 NOTE — Progress Notes (Signed)
Summary: EGD   Phone Note Call from Patient   Caller: Patient Summary of Call: pt does not want to have EGD now he will call when he is ready. He is having knee surgery soon and he states it is to much to do at this time.  He is to call if he has problems, and when he is ready to schedule. Initial call taken by: Chales Abrahams CMA (AAMA),  June 16, 2009 11:00 AM  Follow-up for Phone Call        ok, he should call when he is ready. Follow-up by: Rachael Fee MD,  June 16, 2009 11:13 AM

## 2010-07-12 NOTE — Assessment & Plan Note (Signed)
Summary: 1 MO ROV /NWS   Vital Signs:  Patient profile:   65 year old male Height:      67 inches (170.18 cm) Weight:      356 pounds (161.82 kg) BMI:     55.96 O2 Sat:      93 % on Room air Temp:     98.2 degrees F (36.78 degrees C) oral Pulse rate:   110 / minute BP sitting:   132 / 66  (left arm) Cuff size:   large  Vitals Entered By: Brenton Grills MA (December 30, 2009 4:06 PM)  O2 Flow:  Room air CC: 1 mo F/U/aj Is Patient Diabetic? Yes   Primary Provider:  [name] MD  CC:  1 mo F/U/aj.  History of Present Illness: he feels no different on the clomid. no cbg record, but states cbg's are well-controlled.  it is highest in the afternoon and at hs.  no hypoglycemic sxs.     Current Medications (verified): 1)  Metformin Hcl 500 Mg Tb24 (Metformin Hcl) .... Take 2 Tablet By Mouth Twice A Day 2)  Lovastatin 40 Mg  Tabs (Lovastatin) .... Take 2 By Mouth Qhs 3)  Allopurinol 300 Mg  Tabs (Allopurinol) .... Take 1 By Mouth Qd 4)  Morphine Sulfate Cr 15 Mg  Tb12 (Morphine Sulfate) .... Take 1 By Mouth Two Times A Day Qd 5)  Topamax 50 Mg  Tabs (Topiramate) .... Take 1 By Mouth Two Times A Day 6)  Endocet 7.5-500 Mg  Tabs (Oxycodone-Acetaminophen) .... Take 1 By Mouth Three Times A Day Once Daily Prn 7)  Proventil Hfa 108 (90 Base) Mcg/act  Aers (Albuterol Sulfate) .Marland Kitchen.. 1 Puff Q4h Prn 8)  Claritin-D 12 Hour 5-120 Mg  Tb12 (Loratadine-Pseudoephedrine) .... Take 1 Tablet By Mouth Two Times A Day 9)  Novofine 30g X 8 Mm  Misc (Insulin Pen Needle) .... Use Qid Qd 10)  Zantac 150 Mg  Tabs (Ranitidine Hcl) .... Two Times A Day As Needed Heartburn 11)  Onetouch Test   Strp (Glucose Blood) .... Check Blood Sugar Qid 12)  Furosemide 80 Mg  Tabs (Furosemide) .... Take 1 By Mouth Qd 13)  Ticlopidine Hcl 250 Mg Tabs (Ticlopidine Hcl) .... Bid 14)  Miralax  Powd (Polyethylene Glycol 3350) .Marland KitchenMarland KitchenMarland Kitchen 17 Grams Qd 15)  Lidoderm 5 % Ptch (Lidocaine) .... Qd 16)  Triamcinolone Acetonide 0.1 % Crea  (Triamcinolone Acetonide) .... Three Times A Day As Needed Rash 17)  Bd Insulin Syringe Ultrafine 30g X 1/2" 1 Ml Misc (Insulin Syringe-Needle U-100) .... Use As Directed 18)  Zolpidem Tartrate 10 Mg Tabs (Zolpidem Tartrate) .... At [facility] As Needed Sleep 19)  Hyzaar 100-25 Mg Tabs (Losartan Potassium-Hctz) .... Qd 20)  Klor-Con M20 20 Meq Cr-Tabs (Potassium Chloride Crys Cr) .Marland Kitchen.. 1 Two Times A Day 21)  Diltiazem Hcl Er Beads 360 Mg Xr24h-Cap (Diltiazem Hcl Er Beads) .Marland Kitchen.. 1 Qd 22)  Humalog Kwikpen 100 Unit/ml Soln (Insulin Lispro (Human)) .... (Qac) 80-100-120-20 Units 23)  Humulin N Pen 100 Unit/ml Susp (Insulin Isophane Human) .Marland Kitchen.. 120 Units Qhs 24)  Clomiphene Citrate 50 Mg Tabs (Clomiphene Citrate) .... 1/4 Tab Once Daily  Allergies (verified): No Known Drug Allergies  Past History:  Past Medical History: Last updated: 05/18/2009 gerd EDEMA (ICD-782.3) SORE THROAT (ICD-462) DIABETES MELLITUS, TYPE II (ICD-250.00) LOW BACK PAIN (ICD-724.2) AODM (ICD-250.00) SPECIAL SCREENING MALIGNANT NEOPLASM OF PROSTATE (ICD-V76.44) ROUTINE GENERAL MEDICAL EXAM@HEALTH  CARE FACL (ICD-V70.0) Hyperlipidemia Allergic rhinitis gluacoma Cerebrovascular accident, hx of OSA  DM nephropathy Peripheral neuropathy Nephrolithiasis, hx of chronic venous insufficiency Hypertension Benign prostatic hypertrophy morbid obesity  Social History: Reviewed history from 05/18/2009 and no changes required. married retired - Nature conservation officer Former Smoker   Review of Systems       denies decreased urinary stream.  Physical Exam  General:  morbidly obese.  no distress  Skin:  insulin injection sites at anterior abdomen are normal. Additional Exam:  Hemoglobin A1C       [H]  7.4 %                       4.6-6.5  Testosterone         [L]  205.96 ng/dL     Impression & Recommendations:  Problem # 1:  DIABETES MELLITUS, TYPE II (ICD-250.00) well-controlled, for his complex situation  Problem # 2:   HYPOGONADISM (ICD-257.2) improved, but needs increased rx  Medications Added to Medication List This Visit: 1)  Clomiphene Citrate 50 Mg Tabs (Clomiphene citrate) .... 1/2 tab once daily  Other Orders: TLB-A1C / Hgb A1C (Glycohemoglobin) (83036-A1C) TLB-Testosterone, Total (84403-TESTO) Est. Patient Level III ([address])  Patient Instructions: 1)  blood tests are being ordered for you today.  please call [phone] to hear your test results. 2)  pending the test results, please continue the same medications for now. 3)  Please schedule a follow-up appointment in 3 months. 4)  (update: i left message on phone-tree:  increase clomid to 1/2 of 50 mg once daily) Prescriptions: CLOMIPHENE CITRATE 50 MG TABS (CLOMIPHENE CITRATE) 1/2 tab once daily  #20 x 5   Entered and Authorized by:   Minus Breeding MD   Signed by:   Minus Breeding MD on 12/31/2009   Method used:   Electronically to        [facility] (retail)       [phone] [address]       [place], [place]  [id number]       Ph: [phone]       Fax: [phone]   RxID:   [id number]

## 2010-07-12 NOTE — Assessment & Plan Note (Signed)
Summary: 3 mos f/u #/cd   Vital Signs:  Patient profile:   65 year old male Height:      67 inches (170.18 cm) Weight:      366.25 pounds (166.48 kg) BMI:     57.57 O2 Sat:      87 % on Room air Temp:     97.6 degrees F (36.44 degrees C) oral Pulse rate:   98 / minute BP sitting:   134 / 72  (left arm) Cuff size:   large  Vitals Entered By: Brenton Grills MA (March 31, 2010 3:53 PM)  O2 Flow:  Room air CC: 3 month F/U/aj Is Patient Diabetic? Yes   Referring Provider:  Alice Rieger Primary Provider:  [name] MD  CC:  3 month F/U/aj.  History of Present Illness: pt states 1 years of intermittent severe cramps of the left leg, in the context of sleeping.  there is assoc pain.   he feels no better on the increased clomid.   no cbg record, but states cbg's are well-controlled.  it is highest at hs, and lowest in am.    Current Medications (verified): 1)  Metformin Hcl 500 Mg Tb24 (Metformin Hcl) .... Take 2 Tablet By Mouth Twice A Day 2)  Lovastatin 40 Mg  Tabs (Lovastatin) .... Take 2 By Mouth Qhs 3)  Allopurinol 300 Mg  Tabs (Allopurinol) .... Take 1 By Mouth Qd 4)  Morphine Sulfate Cr 15 Mg  Tb12 (Morphine Sulfate) .... Take 1 By Mouth Two Times A Day Qd 5)  Topamax 50 Mg  Tabs (Topiramate) .... Take 1 By Mouth Two Times A Day 6)  Endocet 7.5-500 Mg  Tabs (Oxycodone-Acetaminophen) .... Take 1 By Mouth Three Times A Day Once Daily Prn 7)  Proventil Hfa 108 (90 Base) Mcg/act  Aers (Albuterol Sulfate) .Marland Kitchen.. 1 Puff Q4h Prn 8)  Claritin-D 12 Hour 5-120 Mg  Tb12 (Loratadine-Pseudoephedrine) .... Take 1 Tablet By Mouth Two Times A Day 9)  Novofine 30g X 8 Mm  Misc (Insulin Pen Needle) .... Use Qid Qd 10)  Zantac 150 Mg  Tabs (Ranitidine Hcl) .... Two Times A Day As Needed Heartburn 11)  Onetouch Test   Strp (Glucose Blood) .... Check Blood Sugar Qid 12)  Furosemide 80 Mg  Tabs (Furosemide) .... Take 1 By Mouth Qd 13)  Miralax  Powd (Polyethylene Glycol 3350) .Marland KitchenMarland KitchenMarland Kitchen 17 Grams  Qd 14)  Lidoderm 5 % Ptch (Lidocaine) .... Qd 15)  Triamcinolone Acetonide 0.1 % Crea (Triamcinolone Acetonide) .... Three Times A Day As Needed Rash 16)  Bd Insulin Syringe Ultrafine 30g X 1/2" 1 Ml Misc (Insulin Syringe-Needle U-100) .... Use As Directed 17)  Zolpidem Tartrate 10 Mg Tabs (Zolpidem Tartrate) .... At [facility] As Needed Sleep 18)  Hyzaar 100-25 Mg Tabs (Losartan Potassium-Hctz) .... Qd 19)  Klor-Con M20 20 Meq Cr-Tabs (Potassium Chloride Crys Cr) .Marland Kitchen.. 1 Two Times A Day 20)  Diltiazem Hcl Er Beads 360 Mg Xr24h-Cap (Diltiazem Hcl Er Beads) .Marland Kitchen.. 1 Qd 21)  Humalog Kwikpen 100 Unit/ml Soln (Insulin Lispro (Human)) .... (Qac) 80-100-120-20 Units 22)  Humulin N Pen 100 Unit/ml Susp (Insulin Isophane Human) .Marland Kitchen.. 120 Units Qhs 23)  Clomiphene Citrate 50 Mg Tabs (Clomiphene Citrate) .... 1/2 Tab Once Daily 24)  Aggrenox 25-200 Mg Xr12h-Cap (Aspirin-Dipyridamole) .Marland Kitchen.. 1 Tab Two Times A Day 25)  Aleve 220 Mg Tabs (Naproxen Sodium) .... 2 Tablets By Mouth Once Daily As Needed For Pain  Allergies (verified): No Known Drug Allergies  Past History:  Past Medical History: Last updated: 05/18/2009 gerd EDEMA (ICD-782.3) SORE THROAT (ICD-462) DIABETES MELLITUS, TYPE II (ICD-250.00) LOW BACK PAIN (ICD-724.2) AODM (ICD-250.00) SPECIAL SCREENING MALIGNANT NEOPLASM OF PROSTATE (ICD-V76.44) ROUTINE GENERAL MEDICAL EXAM@HEALTH  CARE FACL (ICD-V70.0) Hyperlipidemia Allergic rhinitis gluacoma Cerebrovascular accident, hx of OSA DM nephropathy Peripheral neuropathy Nephrolithiasis, hx of chronic venous insufficiency Hypertension Benign prostatic hypertrophy morbid obesity  Review of Systems  The patient denies hypoglycemia.         no chenge in erectile dysfunction  Physical Exam  General:  morbidly obese.   Pulses:  dorsalis pedis intact bilat.  no carotid bruit Extremities:  left leg: has erythema, but no warmth/tend. there is a healed ulcer on the left leg (4x10 cm). 1+ right  pedal edema and 2+ left pedal edema.   Neurologic:  sensation is decreased from normal on the left leg and foot.    Additional Exam:  Testosterone         [L]  233.64 ng/dL                841.66-063.01 Hemoglobin A1C       [H]  7.5 %        Impression & Recommendations:  Problem # 1:  DIABETES MELLITUS, TYPE II (ICD-250.00) Assessment Improved  Problem # 2:  HYPOGONADISM (ICD-257.2) needs increased rx  Problem # 3:  leg cramps uncertain etiology  Medications Added to Medication List This Visit: 1)  Aleve 220 Mg Tabs (Naproxen sodium) .... 2 tablets by mouth once daily as needed for pain 2)  Methocarbamol 500 Mg Tabs (Methocarbamol) .Marland Kitchen.. 1 tab at bedtime as needed for cramps  Other Orders: Flu Vaccine 80yrs + MEDICARE PATIENTS (S0109) Administration Flu vaccine - MCR (G0008) TLB-BMP (Basic Metabolic Panel-BMET) (80048-METABOL) TLB-Testosterone, Total (84403-TESTO) TLB-A1C / Hgb A1C (Glycohemoglobin) (83036-A1C) Est. Patient Level IV ([address])  Patient Instructions: 1)  blood tests are being ordered for you today.  please call [phone] to hear your test results. 2)  take methocarbamol 500 mg at bedtime as needed for cramps. 3)  Please schedule a physical appointment in 3 months.   4)  (update: i left message on phone-tree:  options are increasing clomid vs topical testosterone). Prescriptions: METHOCARBAMOL 500 MG TABS (METHOCARBAMOL) 1 tab at bedtime as needed for cramps  #30 x 11   Entered and Authorized by:   Minus Breeding MD   Signed by:   Minus Breeding MD on 03/31/2010   Method used:   Electronically to        [facility] (retail)       [phone] [address]       [place], [place]  [id number]       Ph: [phone]       Fax: [phone]   RxID:   [id number]    Orders Added: 1)  Flu Vaccine 71yrs + MEDICARE PATIENTS [Q2039] 2)  Administration Flu vaccine - MCR [G0008] 3)  TLB-BMP (Basic Metabolic Panel-BMET) [80048-METABOL] 4)   TLB-Testosterone, Total [84403-TESTO] 5)  TLB-A1C / Hgb A1C (Glycohemoglobin) [83036-A1C] 6)  Est. Patient Level IV [71696]    Flu Vaccine Consent Questions     Do you have a history of severe allergic reactions to this vaccine? no    Any prior history of allergic reactions to egg and/or gelatin? no    Do you have a sensitivity to the preservative Thimersol? no    Do you have a  past history of Guillan-Barre Syndrome? no    Do you currently have an acute febrile illness? no    Have you ever had a severe reaction to latex? no    Vaccine information given and explained to patient? yes    Are you currently pregnant? no    Lot Number:[id number]   Exp Date:12/10/2010   Site Given  Left Deltoid [id number]

## 2010-07-12 NOTE — Assessment & Plan Note (Signed)
Summary: 3 MO ROV /NWS #   Vital Signs:  Patient profile:   65 year old male Weight:      365 pounds Temp:     97.8 degrees F Pulse rate:   108 / minute BP sitting:   170 / 68  (right arm) Cuff size:   large  Vitals Entered By: Lamar Sprinkles, CMA (November 29, 2009 1:33 PM) CC: F/u   Referring Provider:  Alice Rieger Primary Provider:  [name] MD  CC:  F/u.  History of Present Illness: the status of at least 3 ongoing medical problems is addressed today: decreased libido:  pt states it persists. dm:  no cbg record, but states cbg's are well-controlled pituitary insufficiency:  he denies headache  Current Medications (verified): 1)  Metformin Hcl 500 Mg Tb24 (Metformin Hcl) .... Take 2 Tablet By Mouth Twice A Day 2)  Lovastatin 40 Mg  Tabs (Lovastatin) .... Take 2 By Mouth Qhs 3)  Allopurinol 300 Mg  Tabs (Allopurinol) .... Take 1 By Mouth Qd 4)  Morphine Sulfate Cr 15 Mg  Tb12 (Morphine Sulfate) .... Take 1 By Mouth Two Times A Day Qd 5)  Topamax 50 Mg  Tabs (Topiramate) .... Take 1 By Mouth Two Times A Day 6)  Endocet 7.5-500 Mg  Tabs (Oxycodone-Acetaminophen) .... Take 1 By Mouth Three Times A Day Once Daily Prn 7)  Proventil Hfa 108 (90 Base) Mcg/act  Aers (Albuterol Sulfate) .Marland Kitchen.. 1 Puff Q4h Prn 8)  Claritin-D 12 Hour 5-120 Mg  Tb12 (Loratadine-Pseudoephedrine) .... Take 1 Tablet By Mouth Two Times A Day 9)  Novofine 30g X 8 Mm  Misc (Insulin Pen Needle) .... Use Qid Qd 10)  Zantac 150 Mg  Tabs (Ranitidine Hcl) .... Two Times A Day As Needed Heartburn 11)  Onetouch Test   Strp (Glucose Blood) .... Check Blood Sugar Qid 12)  Furosemide 80 Mg  Tabs (Furosemide) .... Take 1 By Mouth Qd 13)  Ticlopidine Hcl 250 Mg Tabs (Ticlopidine Hcl) .... Bid 14)  Miralax  Powd (Polyethylene Glycol 3350) .Marland KitchenMarland KitchenMarland Kitchen 17 Grams Qd 15)  Lidoderm 5 % Ptch (Lidocaine) .... Qd 16)  Triamcinolone Acetonide 0.1 % Crea (Triamcinolone Acetonide) .... Three Times A Day As Needed Rash 17)  Bd Insulin Syringe  Ultrafine 30g X 1/2" 1 Ml Misc (Insulin Syringe-Needle U-100) .... Use As Directed 18)  Zolpidem Tartrate 10 Mg Tabs (Zolpidem Tartrate) .... At [facility] As Needed Sleep 19)  Hyzaar 100-25 Mg Tabs (Losartan Potassium-Hctz) .... Qd 20)  Klor-Con M20 20 Meq Cr-Tabs (Potassium Chloride Crys Cr) .Marland Kitchen.. 1 Two Times A Day 21)  Diltiazem Hcl Er Beads 360 Mg Xr24h-Cap (Diltiazem Hcl Er Beads) .Marland Kitchen.. 1 Qd 22)  Humalog Kwikpen 100 Unit/ml Soln (Insulin Lispro (Human)) .... (Qac) 80-100-120-20 Units 23)  Humulin N Pen 100 Unit/ml Susp (Insulin Isophane Human) .Marland Kitchen.. 120 Units Qhs  Allergies (verified): No Known Drug Allergies  Past History:  Past Medical History: Last updated: 05/18/2009 gerd EDEMA (ICD-782.3) SORE THROAT (ICD-462) DIABETES MELLITUS, TYPE II (ICD-250.00) LOW BACK PAIN (ICD-724.2) AODM (ICD-250.00) SPECIAL SCREENING MALIGNANT NEOPLASM OF PROSTATE (ICD-V76.44) ROUTINE GENERAL MEDICAL EXAM@HEALTH  CARE FACL (ICD-V70.0) Hyperlipidemia Allergic rhinitis gluacoma Cerebrovascular accident, hx of OSA DM nephropathy Peripheral neuropathy Nephrolithiasis, hx of chronic venous insufficiency Hypertension Benign prostatic hypertrophy morbid obesity  Review of Systems  The patient denies hypoglycemia.         he has intermittently decreased urinary stream  Physical Exam  General:  morbidly obese.  no distress  Genitalia:  Normal external male genitalia with no urethral discharge.    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 1/4 tab once daily  Other Orders: Radiology Referral (Radiology) Est. Patient Level IV (13086)  Patient Instructions: 1)  check mri of pituitary.  you will be called with a day and time for  an appointment. 2)  normalization of testosterone is not known to harm you.  however, there are "theoretical" risks, including increased fertility, hair loss, prostate cancer, benign prostate enlargement, lower hdl ("good cholesterol"), sleep apnea, and behavior changes. 3)  clomiphine 1/4 of 50 mg once daily. 4)  Please schedule a follow-up appointment in 1 month. 5)  (addendum:  same insulin) Prescriptions: CLOMIPHENE CITRATE 50 MG TABS (CLOMIPHENE CITRATE) 1/4 tab once daily  #10 x 11   Entered and Authorized by:   Minus Breeding MD   Signed by:   Minus Breeding MD on 11/29/2009   Method used:   Electronically to        [facility] (retail)       [phone] [address]       [place], [place]  [id number]       Ph: [phone]       Fax: [phone]   RxID:   [id number]

## 2010-07-12 NOTE — Progress Notes (Signed)
Summary: ALT med  Phone Note Call from Patient Call back at Home Phone [phone]   Caller: Patient Summary of Call: Pt called stating that Ticlodipione is on long-term back order. Pt was advised to request an alternative blood thinner. Please advise Initial call taken by: Margaret Pyle, CMA,  February 18, 2010 11:03 AM  Follow-up for Phone Call        i changed to aggrenox Follow-up by: Minus Breeding MD,  February 18, 2010 11:48 AM  Additional Follow-up for Phone Call Additional follow up Details #1::        Pt informed Additional Follow-up by: Margaret Pyle, CMA,  February 18, 2010 11:52 AM    New/Updated Medications: AGGRENOX 25-200 MG XR12H-CAP (ASPIRIN-DIPYRIDAMOLE) 1 tab two times a day Prescriptions: AGGRENOX 25-200 MG XR12H-CAP (ASPIRIN-DIPYRIDAMOLE) 1 tab two times a day  #60 x 11   Entered and Authorized by:   Minus Breeding MD   Signed by:   Minus Breeding MD on 02/18/2010   Method used:   Electronically to        [facility] (retail)       [phone] [address]       [place], [place]  [id number]       Ph: [phone]       Fax: [phone]   RxID:   [id number]

## 2010-07-14 ENCOUNTER — Encounter: Payer: Self-pay | Admitting: Endocrinology

## 2010-07-14 NOTE — Letter (Signed)
Summary: [facility]   Imported By: Sherian Rein 06/01/2010 08:25:24  _____________________________________________________________________  External Attachment:    Type:   Image     Comment:   External Document

## 2010-07-20 NOTE — Assessment & Plan Note (Signed)
Summary: YEARLY FU/ MEDICARE/ LABS AFTER/NWS  #   Vital Signs:  Patient profile:   65 year old male Height:      67 inches (170.18 cm) Weight:      372.25 pounds (169.20 kg) BMI:     58.51 O2 Sat:      90 % on Room air Temp:     98.4 degrees F (36.89 degrees C) oral Pulse rate:   103 / minute Pulse rhythm:   regular BP sitting:   124 / 62  (left arm) Cuff size:   large  Vitals Entered By: Brenton Grills CMA [name]) (July 06, 2010 8:49 AM)  O2 Flow:  Room air CC: Yearly Medicare Wellness/aj Is Patient Diabetic? Yes   Referring Provider:  Alice Rieger Primary Provider:  [name] MD  CC:  Yearly Medicare Wellness/aj.  History of Present Illness: pt states few years of intermittent moderate heartburn in the chest, in the context of taking other medications.  he has intermittent assoc dysphagia. he is not sure to what extent dry mouth causes his dysphagia. he is medically unable to undergo knee surgery (dr Despina Hick), due to other med probs.  however, he is worried about interaction of pain med with [place]. aggrenox is too expensive.     Current Medications (verified): 1)  Metformin Hcl 500 Mg Tb24 (Metformin Hcl) .... Take 2 Tablet By Mouth Twice A Day 2)  Lovastatin 40 Mg  Tabs (Lovastatin) .... Take 2 By Mouth Qhs 3)  Allopurinol 300 Mg  Tabs (Allopurinol) .... Take 1 By Mouth Qd 4)  Morphine Sulfate Cr 15 Mg  Tb12 (Morphine Sulfate) .... Take 1 By Mouth Two Times A Day Qd 5)  Topamax 50 Mg  Tabs (Topiramate) .... Take 1 By Mouth Two Times A Day 6)  Endocet 7.5-500 Mg  Tabs (Oxycodone-Acetaminophen) .... Take 1 By Mouth Three Times A Day Once Daily Prn 7)  Proventil Hfa 108 (90 Base) Mcg/act  Aers (Albuterol Sulfate) .Marland Kitchen.. 1 Puff Q4h Prn 8)  Claritin-D 12 Hour 5-120 Mg  Tb12 (Loratadine-Pseudoephedrine) .... Take 1 Tablet By Mouth Two Times A Day 9)  Novofine 30g X 8 Mm  Misc (Insulin Pen Needle) .... Use Qid Qd 10)  Zantac 150 Mg  Tabs (Ranitidine Hcl) .... Two Times A Day As  Needed Heartburn 11)  Onetouch Test   Strp (Glucose Blood) .... Check Blood Sugar Qid 12)  Furosemide 80 Mg  Tabs (Furosemide) .... Take 1 By Mouth Qd 13)  Miralax  Powd (Polyethylene Glycol 3350) .Marland KitchenMarland KitchenMarland Kitchen 17 Grams Qd 14)  Lidoderm 5 % Ptch (Lidocaine) .... Qd 15)  Triamcinolone Acetonide 0.1 % Crea (Triamcinolone Acetonide) .... Three Times A Day As Needed Rash 16)  Bd Insulin Syringe Ultrafine 30g X 1/2" 1 Ml Misc (Insulin Syringe-Needle U-100) .... Use As Directed 17)  Zolpidem Tartrate 10 Mg Tabs (Zolpidem Tartrate) .... At [facility] As Needed Sleep 18)  Hyzaar 100-25 Mg Tabs (Losartan Potassium-Hctz) .... Qd 19)  Klor-Con M20 20 Meq Cr-Tabs (Potassium Chloride Crys Cr) .Marland Kitchen.. 1 Two Times A Day 20)  Diltiazem Hcl Er Beads 360 Mg Xr24h-Cap (Diltiazem Hcl Er Beads) .Marland Kitchen.. 1 Qd 21)  Humalog Kwikpen 100 Unit/ml Soln (Insulin Lispro (Human)) .... (Qac) 80-100-120-20 Units 22)  Humulin N Pen 100 Unit/ml Susp (Insulin Isophane Human) .Marland Kitchen.. 120 Units Qhs 23)  Clomiphene Citrate 50 Mg Tabs (Clomiphene Citrate) .... 1/2 Tab Once Daily 24)  Aggrenox 25-200 Mg Xr12h-Cap (Aspirin-Dipyridamole) .Marland Kitchen.. 1 Tab Two Times A Day 25)  Aleve 220 Mg Tabs (Naproxen Sodium) .... 2 Tablets By Mouth Once Daily As Needed For Pain 26)  Methocarbamol 500 Mg Tabs (Methocarbamol) .Marland Kitchen.. 1 Tab At Bedtime As Needed For Cramps  Allergies (verified): No Known Drug Allergies  Past History:  Past Medical History: Last updated: 05/18/2009 gerd EDEMA (ICD-782.3) SORE THROAT (ICD-462) DIABETES MELLITUS, TYPE II (ICD-250.00) LOW BACK PAIN (ICD-724.2) AODM (ICD-250.00) SPECIAL SCREENING MALIGNANT NEOPLASM OF PROSTATE (ICD-V76.44) ROUTINE GENERAL MEDICAL EXAM@HEALTH  CARE FACL (ICD-V70.0) Hyperlipidemia Allergic rhinitis gluacoma Cerebrovascular accident, hx of OSA DM nephropathy Peripheral neuropathy Nephrolithiasis, hx of chronic venous insufficiency Hypertension Benign prostatic hypertrophy morbid obesity  Review of Systems        The patient complains of weight gain.  The patient denies hematochezia.    Physical Exam  General:  morbidly obese.  no distress  Extremities:  1+ right pedal edema and 2+ left pedal edema.   Additional Exam:  White Cell Count     [H]  14.1 K/uL                   4.5-10.5   Red Cell Count            4.52 Mil/uL                 4.22-5.81   Hemoglobin                14.2 g/dL                   45.4-09.8   Hematocrit                42.5 %                      39.0-52.0   MCV                       94.0 fl                     78.0-100.0   MCHC                      33.4 g/dL                   11.9-14.7   RDW                  [H]  15.4 %                      11.5-14.6   Platelet Count            290.0 K/uL     Impression & Recommendations:  Problem # 1:  GERD (ICD-530.81) Assessment New  Problem # 2:  CEREBROVASCULAR ACCIDENT, HX OF (ICD-V12.50) pt should continue aggrenox if at all possible.  Problem # 3:  DEGENERATIVE JOINT DISEASE (ICD-715.90) he is at risk for drug-drug interaction--we'll do the best we can  Medications Added to Medication List This Visit: 1)  Wheelchair  .... 715.90 2)  Clobetasol Propionate 0.05 % Oint (Clobetasol propionate) .... Three times a day as needed for itching  Other Orders: EKG w/ Interpretation (93000) Gastroenterology Referral (GI) TLB-Lipid Panel (80061-LIPID) TLB-BMP (Basic Metabolic Panel-BMET) (80048-METABOL) TLB-CBC Platelet - w/Differential (85025-CBCD) TLB-Hepatic/Liver Function Pnl (80076-HEPATIC) TLB-TSH (Thyroid Stimulating Hormone) (84443-TSH) TLB-A1C / Hgb A1C (Glycohemoglobin) (83036-A1C) TLB-Microalbumin/Creat Ratio, Urine (82043-MALB) TLB-Udip w/ Micro (81001-URINE) TLB-PSA (Prostate Specific Antigen) (84153-PSA) TLB-Testosterone, Total (  84403-TESTO) TLB-Uric Acid, Blood (84550-URIC) Est. Patient Level IV (45409)  Patient Instructions: 1)  refer back to dr Christella Hartigan.  you will be called with a day and time for an  appointment.   2)  here is a prescription for a wheeelchair. 3)  you should try to minimize the zolpidem, due to interaction with pain medications.   4)  go to www.[email or web address].com, to sign up to lower your copay.   5)  blood tests are being ordered for you today.  please call [phone] to hear your test results.   6)  Please schedule a "medicare wellness" appointment in 2 weeks. Prescriptions: CLOBETASOL PROPIONATE 0.05 % OINT (CLOBETASOL PROPIONATE) three times a day as needed for itching  #1 large tube x 3   Entered and Authorized by:   Minus Breeding MD   Signed by:   Minus Breeding MD on 07/06/2010   Method used:   Electronically to        [facility] (retail)       [phone] [address]       [place], [place]  [id number]       Ph: [phone]       Fax: [phone]   RxID:   [id number] WHEELCHAIR 715.90  #1 x 0   Entered and Authorized by:   Minus Breeding MD   Signed by:   Minus Breeding MD on 07/06/2010   Method used:   Print then Give to Patient   RxID:   [id number] ZANTAC 150 MG  TABS (RANITIDINE HCL) two times a day as needed heartburn  #60 Tablet x 11   Entered and Authorized by:   Minus Breeding MD   Signed by:   Minus Breeding MD on 07/06/2010   Method used:   Electronically to        [facility] (retail)       [phone] [address]       [place], [place]  [id number]       Ph: [phone]       Fax: [phone]   RxID:   [id number]    Orders Added: 1)  EKG w/ Interpretation [93000] 2)  Gastroenterology Referral [GI] 3)  TLB-Lipid Panel [80061-LIPID] 4)  TLB-BMP (Basic Metabolic Panel-BMET) [80048-METABOL] 5)  TLB-CBC Platelet - w/Differential [85025-CBCD] 6)  TLB-Hepatic/Liver Function Pnl [80076-HEPATIC] 7)  TLB-TSH (Thyroid Stimulating Hormone) [84443-TSH] 8)  TLB-A1C / Hgb A1C (Glycohemoglobin) [83036-A1C] 9)  TLB-Microalbumin/Creat Ratio, Urine [82043-MALB] 10)  TLB-Udip w/  Micro [81001-URINE] 11)  TLB-PSA (Prostate Specific Antigen) [84153-PSA] 12)  TLB-Testosterone, Total [84403-TESTO] 13)  TLB-Uric Acid, Blood [84550-URIC] 14)  Est. Patient Level IV [62694]

## 2010-07-26 ENCOUNTER — Encounter (INDEPENDENT_AMBULATORY_CARE_PROVIDER_SITE_OTHER): Payer: Medicare Other | Admitting: Endocrinology

## 2010-07-26 ENCOUNTER — Encounter: Payer: Self-pay | Admitting: Endocrinology

## 2010-07-26 DIAGNOSIS — Z0000 Encounter for general adult medical examination without abnormal findings: Secondary | ICD-10-CM

## 2010-07-26 DIAGNOSIS — E119 Type 2 diabetes mellitus without complications: Secondary | ICD-10-CM

## 2010-07-28 NOTE — Letter (Signed)
Summary: CMN for wheelchair/[facility]  CMN for wheelchair/[facility]   Imported By: Sherian Rein 07/20/2010 10:14:36  _____________________________________________________________________  External Attachment:    Type:   Image     Comment:   External Document

## 2010-08-03 NOTE — Assessment & Plan Note (Signed)
Summary: medicare wellness/ok time/#/cd   Vital Signs:  Patient profile:   65 year old male Height:      67 inches (170.18 cm) Weight:      370 pounds (168.18 kg) BMI:     58.16 O2 Sat:      91 % on Room air Temp:     98.4 degrees F (36.89 degrees C) oral Pulse rate:   97 / minute BP sitting:   138 / 60  (left arm) Cuff size:   large  Vitals Entered By: Brenton Grills CMA (AAMA) (July 26, 2010 8:08 AM)  O2 Flow:  Room air CC: Medicare Wellness/aj Is Patient Diabetic? Yes   Referring Provider:  Alice Rieger Primary Provider:  [name] MD  CC:  Medicare Wellness/aj.  History of Present Illness: pt is here for medicare welllness visit.  he denies memory loss and depression.  he says he is able to perform activities of daily living without assistance.    Current Medications (verified): 1)  Metformin Hcl 500 Mg Tb24 (Metformin Hcl) .... Take 2 Tablet By Mouth Twice A Day 2)  Lovastatin 40 Mg  Tabs (Lovastatin) .... Take 2 By Mouth Qhs 3)  Allopurinol 300 Mg  Tabs (Allopurinol) .... Take 1 By Mouth Qd 4)  Morphine Sulfate Cr 15 Mg  Tb12 (Morphine Sulfate) .... Take 1 By Mouth Two Times A Day Qd 5)  Topamax 50 Mg  Tabs (Topiramate) .... Take 1 By Mouth Two Times A Day 6)  Endocet 7.5-500 Mg  Tabs (Oxycodone-Acetaminophen) .... Take 1 By Mouth Three Times A Day Once Daily Prn 7)  Proventil Hfa 108 (90 Base) Mcg/act  Aers (Albuterol Sulfate) .Marland Kitchen.. 1 Puff Q4h Prn 8)  Claritin-D 12 Hour 5-120 Mg  Tb12 (Loratadine-Pseudoephedrine) .... Take 1 Tablet By Mouth Two Times A Day 9)  Novofine 30g X 8 Mm  Misc (Insulin Pen Needle) .... Use Qid Qd 10)  Zantac 150 Mg  Tabs (Ranitidine Hcl) .... Two Times A Day As Needed Heartburn 11)  Onetouch Test   Strp (Glucose Blood) .... Check Blood Sugar Qid 12)  Furosemide 80 Mg  Tabs (Furosemide) .... Take 1 By Mouth Qd 13)  Miralax  Powd (Polyethylene Glycol 3350) .Marland KitchenMarland KitchenMarland Kitchen 17 Grams Qd 14)  Lidoderm 5 % Ptch (Lidocaine) .... Qd 15)  Bd Insulin Syringe  Ultrafine 30g X 1/2" 1 Ml Misc (Insulin Syringe-Needle U-100) .... Use As Directed 16)  Zolpidem Tartrate 10 Mg Tabs (Zolpidem Tartrate) .... At [facility] As Needed Sleep 17)  Hyzaar 100-25 Mg Tabs (Losartan Potassium-Hctz) .... Qd 18)  Klor-Con M20 20 Meq Cr-Tabs (Potassium Chloride Crys Cr) .Marland Kitchen.. 1 Two Times A Day 19)  Diltiazem Hcl Er Beads 360 Mg Xr24h-Cap (Diltiazem Hcl Er Beads) .Marland Kitchen.. 1 Qd 20)  Humalog Kwikpen 100 Unit/ml Soln (Insulin Lispro (Human)) .... (Qac) 80-100-120-20 Units 21)  Humulin N Pen 100 Unit/ml Susp (Insulin Isophane Human) .Marland Kitchen.. 120 Units Qhs 22)  Clomiphene Citrate 50 Mg Tabs (Clomiphene Citrate) .... 1/2 Tab Once Daily 23)  Aggrenox 25-200 Mg Xr12h-Cap (Aspirin-Dipyridamole) .Marland Kitchen.. 1 Tab Two Times A Day 24)  Aleve 220 Mg Tabs (Naproxen Sodium) .... 2 Tablets By Mouth Once Daily As Needed For Pain 25)  Methocarbamol 500 Mg Tabs (Methocarbamol) .Marland Kitchen.. 1 Tab At Bedtime As Needed For Cramps 26)  Wheelchair .... 715.90 27)  Clobetasol Propionate 0.05 % Oint (Clobetasol Propionate) .... Three Times A Day As Needed For Itching  Allergies (verified): No Known Drug Allergies  Past History:  Past  Medical History: gerd EDEMA (ICD-782.3) SORE THROAT (ICD-462) DIABETES MELLITUS, TYPE II (ICD-250.00) LOW BACK PAIN (ICD-724.2) AODM (ICD-250.00) SPECIAL SCREENING MALIGNANT NEOPLASM OF PROSTATE (ICD-V76.44) ROUTINE GENERAL MEDICAL EXAM@HEALTH  CARE FACL (ICD-V70.0) Hyperlipidemia Allergic rhinitis gluacoma Cerebrovascular accident, hx of OSA DM nephropathy Peripheral neuropathy Nephrolithiasis, hx of chronic venous insufficiency Hypertension Benign prostatic hypertrophy morbid obesity  gi: jacobs opthal: hecker urol: uncertain pulm: clance  Past Surgical History: Reviewed history from 05/18/2009 and no changes required. Denies surgical history   Family History: Reviewed history from 05/18/2009 and no changes required. brother with colon cancer  heart disease - 2  brothers  Social History: Reviewed history from 05/18/2009 and no changes required. 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    Physical Exam  General:  morbidly obese.  no distress  Eyes:  (sees opthal)  Ears:  grossly normal hearing.   Msk:  pt performs "get-up-and-go" from a sitting position in less than 10 seconds. Psych:  remembers 3/3 at 5 minutes.  excellent recall.  can easily read and write a sentence.  alert and oriented x 3. Additional Exam:  SEPARATE EVALUATION FOLLOWS--EACH PROBLEM HERE IS NEW, NOT RESPONDING TO TREATMENT, OR POSES SIGNIFICANT RISK TO THE PATIENT'S HEALTH: HISTORY OF THE PRESENT ILLNESS: pt reports cbg's are elevated at all times of day, but lowest in am. he takes clomid as rx'ed.   PAST MEDICAL HISTORY reviewed and up to date today REVIEW OF SYSTEMS: denies hypoglycemia and decreased urinary stream PHYSICAL EXAMINATION: see vs page gait: is slow but steady psych:  does not appear anxious nor depressed LAB/XRAY RESULTS: testosterone is low IMPRESSION: dm, needs increased rx hypogonadism, needs increased rx PLAN: see instruction page   Impression & Recommendations:  Problem # 1:  ROUTINE GENERAL MEDICAL EXAM@HEALTH  CARE FACL (ICD-V70.0)  Medications Added to Medication List This Visit: 1)  Humalog Kwikpen 100 Unit/ml Soln (Insulin lispro (human)) .... (qac) 80-100-140-20 units 2)  Clomiphene Citrate 50 Mg Tabs (Clomiphene citrate) .Marland Kitchen.. 1 tab once daily  Other Orders: Est. Patient Level III (16109) Medicare -1st Annual Wellness Visit [phone])   Patient Instructions: 1)  increase clomiphine to a whole pill of 50 mg once daily. 2)  increase humalog to three times a day (just before each meal) 80-100-140-20 units. 3)  same humulin nph at night. 4)  please consider these measures for your health:  minimize alcohol.  do not use  tobacco products.  have a colonoscopy at least every 10 years from age 78.  keep firearms safely stored.  always use seat belts.  have working smoke alarms in your home.  see an eye doctor and dentist regularly.  never drive under the influence of alcohol or drugs (including prescription drugs).  those with fair skin should take precautions against the sun. 5)  please let me know what your wishes would be, if artificial life support measures should become necessary.  it is critically important to prevent falling down (keep floor areas well-lit, dry, and free of loose objects) 6)  you are due for another colonoscopy.  please call when you are ready to go ahead with it.   7)  refer to a dietician.  you will be called with a day and time for an appointment. 8)  Please schedule a follow-up appointment in 3 months. Prescriptions: HUMALOG KWIKPEN 100 UNIT/ML SOLN (INSULIN LISPRO (HUMAN)) (qac) 80-100-140-20 units  #8 boxes x 11  Entered and Authorized by:   Minus Breeding MD   Signed by:   Minus Breeding MD on 07/26/2010   Method used:   Electronically to        [facility] (retail)       [phone] [address]       [place], [place]  [address]       Ph: [phone]       Fax: [phone]   RxID:   [id number] CLOMIPHENE CITRATE 50 MG TABS (CLOMIPHENE CITRATE) 1 tab once daily  #30 x 11   Entered and Authorized by:   Minus Breeding MD   Signed by:   Minus Breeding MD on 07/26/2010   Method used:   Print then Give to Patient   RxID:   [id number]    Orders Added: 1)  Est. Patient Level III [75643] 2)  Medicare -1st Annual Wellness Visit [G0438]

## 2010-08-09 ENCOUNTER — Encounter: Payer: Medicare Other | Attending: Physical Medicine and Rehabilitation

## 2010-08-09 ENCOUNTER — Ambulatory Visit: Payer: Medicare Other

## 2010-08-09 ENCOUNTER — Encounter: Payer: Self-pay | Admitting: Gastroenterology

## 2010-08-09 ENCOUNTER — Ambulatory Visit (INDEPENDENT_AMBULATORY_CARE_PROVIDER_SITE_OTHER): Payer: Medicare Other | Admitting: Gastroenterology

## 2010-08-09 DIAGNOSIS — M25559 Pain in unspecified hip: Secondary | ICD-10-CM | POA: Insufficient documentation

## 2010-08-09 DIAGNOSIS — I872 Venous insufficiency (chronic) (peripheral): Secondary | ICD-10-CM | POA: Insufficient documentation

## 2010-08-09 DIAGNOSIS — M545 Low back pain, unspecified: Secondary | ICD-10-CM

## 2010-08-09 DIAGNOSIS — M47814 Spondylosis without myelopathy or radiculopathy, thoracic region: Secondary | ICD-10-CM

## 2010-08-09 DIAGNOSIS — M1710 Unilateral primary osteoarthritis, unspecified knee: Secondary | ICD-10-CM

## 2010-08-09 DIAGNOSIS — M79609 Pain in unspecified limb: Secondary | ICD-10-CM

## 2010-08-09 DIAGNOSIS — M25569 Pain in unspecified knee: Secondary | ICD-10-CM | POA: Insufficient documentation

## 2010-08-09 DIAGNOSIS — Z79899 Other long term (current) drug therapy: Secondary | ICD-10-CM | POA: Insufficient documentation

## 2010-08-09 DIAGNOSIS — R12 Heartburn: Secondary | ICD-10-CM

## 2010-08-09 DIAGNOSIS — M7989 Other specified soft tissue disorders: Secondary | ICD-10-CM | POA: Insufficient documentation

## 2010-08-09 DIAGNOSIS — Z800 Family history of malignant neoplasm of digestive organs: Secondary | ICD-10-CM

## 2010-08-09 DIAGNOSIS — G8929 Other chronic pain: Secondary | ICD-10-CM | POA: Insufficient documentation

## 2010-08-18 NOTE — Assessment & Plan Note (Signed)
History of Present Illness Visit Type: Initial Consult Primary GI MD: [name] MD Primary Provider: [name] MD Requesting Provider: [name], MD Chief Complaint: GERD with globus sensation. Pt takes Zantac which helps with reflux. History of Present Illness:      very pleasant 65 year old man who is here with his wife today   .his brother had colon cancer, his last colonoscopy was in 2006.  I recommended another one a few months ago, but he declined.  Today he again declined.  He has a 'frog' in his throat.  He has bee on zantac for a very long time.  He has chronic GERD, pyrosis and these symptom are under complete control on the zantac twice daily.  His weight is stable (morbidly obese).  NO overt GI bleeding.  Never any dysphagia, but does have a globus type sensation periodically, chronically.     blood work last month shows that he is not anemic.            Current Medications (verified): 1)  Metformin Hcl 500 Mg Tb24 (Metformin Hcl) .... Take 2 Tablet By Mouth Twice A Day 2)  Lovastatin 40 Mg  Tabs (Lovastatin) .... Take 2 By Mouth Qhs 3)  Allopurinol 300 Mg  Tabs (Allopurinol) .... Take 1 By Mouth Qd 4)  Morphine Sulfate Cr 15 Mg  Tb12 (Morphine Sulfate) .... Take 1 By Mouth Two Times A Day Qd 5)  Topamax 50 Mg  Tabs (Topiramate) .... Take 1 By Mouth Two Times A Day 6)  Endocet 7.5-500 Mg  Tabs (Oxycodone-Acetaminophen) .... Take 1 By Mouth Three Times A Day Once Daily Prn 7)  Proventil Hfa 108 (90 Base) Mcg/act  Aers (Albuterol Sulfate) .Marland Kitchen.. 1 Puff Q4h Prn 8)  Claritin-D 12 Hour 5-120 Mg  Tb12 (Loratadine-Pseudoephedrine) .... Take 1 Tablet By Mouth Two Times A Day 9)  Novofine 30g X 8 Mm  Misc (Insulin Pen Needle) .... Use Qid Qd 10)  Zantac 150 Mg  Tabs (Ranitidine Hcl) .... Two Times A Day As Needed Heartburn 11)  Onetouch Test   Strp (Glucose Blood) .... Check Blood Sugar Qid 12)  Furosemide 80 Mg  Tabs (Furosemide) .... Take 1 By Mouth Qd 13)   Miralax  Powd (Polyethylene Glycol 3350) .Marland KitchenMarland KitchenMarland Kitchen 17 Grams Qd 14)  Lidoderm 5 % Ptch (Lidocaine) .... Qd 15)  Bd Insulin Syringe Ultrafine 30g X 1/2" 1 Ml Misc (Insulin Syringe-Needle U-100) .... Use As Directed 16)  Zolpidem Tartrate 10 Mg Tabs (Zolpidem Tartrate) .... At [facility] As Needed Sleep 17)  Hyzaar 100-25 Mg Tabs (Losartan Potassium-Hctz) .... Qd 18)  Klor-Con M20 20 Meq Cr-Tabs (Potassium Chloride Crys Cr) .Marland Kitchen.. 1 Two Times A Day 19)  Diltiazem Hcl Er Beads 360 Mg Xr24h-Cap (Diltiazem Hcl Er Beads) .Marland Kitchen.. 1 Qd 20)  Humalog Kwikpen 100 Unit/ml Soln (Insulin Lispro (Human)) .... (Qac) 80-100-140-20 Units 21)  Humulin N Pen 100 Unit/ml Susp (Insulin Isophane Human) .Marland Kitchen.. 120 Units Qhs 22)  Clomiphene Citrate 50 Mg Tabs (Clomiphene Citrate) .Marland Kitchen.. 1 Tab Once Daily 23)  Aggrenox 25-200 Mg Xr12h-Cap (Aspirin-Dipyridamole) .Marland Kitchen.. 1 Tab Two Times A Day 24)  Aleve 220 Mg Tabs (Naproxen Sodium) .... 2 Tablets By Mouth Once Daily As Needed For Pain 25)  Methocarbamol 500 Mg Tabs (Methocarbamol) .Marland Kitchen.. 1 Tab At Bedtime As Needed For Cramps 26)  Clobetasol Propionate 0.05 % Oint (Clobetasol Propionate) .... Three Times A Day As Needed For Itching  Allergies (verified): No Known Drug Allergies  Past History:  Past Medical History: gerd EDEMA (ICD-782.3) SORE THROAT (ICD-462) DIABETES MELLITUS, TYPE II (ICD-250.00) LOW BACK PAIN (ICD-724.2) AODM (ICD-250.00) SPECIAL SCREENING MALIGNANT NEOPLASM OF PROSTATE (ICD-V76.44) ROUTINE GENERAL MEDICAL EXAM@HEALTH  CARE FACL (ICD-V70.0) Hyperlipidemia Allergic rhinitis gluacoma Cerebrovascular accident, hx of OSA  morbid obesity DM nephropathy Peripheral neuropathy Nephrolithiasis, hx of chronic venous insufficiency Hypertension Benign prostatic hypertrophy morbid obesity  gi: jacobs opthal: hecker urol: uncertain pulm: clance  Past Surgical History: Denies surgical history    Family History: brother with colon cancer    heart disease - 2  brothers  Social History: married retired - Nature conservation officer Former Smoker many years ago alcohol: none no illegal drugs diet is "good" exercise is limited by medical probs.      Vital Signs:  Patient profile:   65 year old male Height:      67 inches Weight:      368 pounds BMI:     57.85 Pulse rate:   105 / minute Pulse rhythm:   regular BP sitting:   142 / 68  (left arm) Cuff size:   large  Vitals Entered By: [name] CMA [name]) (August 09, 2010 3:16 PM)  Physical Exam  Additional Exam:  Constitutional:  he is massively obese, sits in a wheelchair Psychiatric: alert and oriented times 3 Eyes: extraocular movements intact Mouth: oropharynx moist, no lesions Neck: supple, no lymphadenopathy Cardiovascular: heart regular rate and rythm Lungs: CTA bilaterally Abdomen: soft, non-tender, non-distended, no obvious ascites, no peritoneal signs, normal bowel sounds Extremities: no lower extremity edema bilaterally Skin: no lesions on visible extremities    Impression & Recommendations:  Problem # 1:   chronic pyrosis  his GERD symptoms are under good control on twice daily H2 blocker. He has no alarm symptoms and I see no reason for endoscopy. He should stay on H2 blocker for symptoms control.   He understands that losing weight would probably help his GERD.  Problem # 2:   his brother had colon cancer  I again recommended colonoscopy given his family history of colon cancer however he declined. He will get in touch if he changes his mind.    Patient Instructions: 1)  Stay on zantac twice daily. 2)  Call Dr. Christella Hartigan' office if you reconsider doing a colonoscopy since your brother had colon cancer. 3)  The medication list was reviewed and reconciled.  All changed / newly prescribed medications were explained.  A complete medication list was provided to the patient / caregiver.

## 2010-09-06 ENCOUNTER — Encounter: Payer: Medicare Other | Attending: Physical Medicine and Rehabilitation

## 2010-09-06 ENCOUNTER — Ambulatory Visit: Payer: 59

## 2010-09-06 DIAGNOSIS — I872 Venous insufficiency (chronic) (peripheral): Secondary | ICD-10-CM | POA: Insufficient documentation

## 2010-09-06 DIAGNOSIS — Z79899 Other long term (current) drug therapy: Secondary | ICD-10-CM | POA: Insufficient documentation

## 2010-09-06 DIAGNOSIS — M7989 Other specified soft tissue disorders: Secondary | ICD-10-CM | POA: Insufficient documentation

## 2010-09-06 DIAGNOSIS — M545 Low back pain, unspecified: Secondary | ICD-10-CM

## 2010-09-06 DIAGNOSIS — G8929 Other chronic pain: Secondary | ICD-10-CM | POA: Insufficient documentation

## 2010-09-06 DIAGNOSIS — M79609 Pain in unspecified limb: Secondary | ICD-10-CM

## 2010-09-06 DIAGNOSIS — M47817 Spondylosis without myelopathy or radiculopathy, lumbosacral region: Secondary | ICD-10-CM | POA: Insufficient documentation

## 2010-09-06 DIAGNOSIS — M47814 Spondylosis without myelopathy or radiculopathy, thoracic region: Secondary | ICD-10-CM

## 2010-09-06 DIAGNOSIS — M1710 Unilateral primary osteoarthritis, unspecified knee: Secondary | ICD-10-CM | POA: Insufficient documentation

## 2010-09-06 DIAGNOSIS — M25569 Pain in unspecified knee: Secondary | ICD-10-CM | POA: Insufficient documentation

## 2010-09-12 ENCOUNTER — Other Ambulatory Visit: Payer: Self-pay | Admitting: Endocrinology

## 2010-09-12 NOTE — Telephone Encounter (Signed)
Right, it is qid

## 2010-09-12 NOTE — Telephone Encounter (Signed)
Pharmacist called requesting clarification on Insulin directions, pt was Rx'd Humalog tid but units are 80-100-140-20. Please advise.

## 2010-10-10 ENCOUNTER — Encounter
Payer: Medicare Other | Attending: Physical Medicine and Rehabilitation | Admitting: Physical Medicine and Rehabilitation

## 2010-10-10 DIAGNOSIS — M1710 Unilateral primary osteoarthritis, unspecified knee: Secondary | ICD-10-CM

## 2010-10-10 DIAGNOSIS — M7989 Other specified soft tissue disorders: Secondary | ICD-10-CM | POA: Insufficient documentation

## 2010-10-10 DIAGNOSIS — M545 Low back pain, unspecified: Secondary | ICD-10-CM | POA: Insufficient documentation

## 2010-10-10 DIAGNOSIS — R279 Unspecified lack of coordination: Secondary | ICD-10-CM

## 2010-10-10 DIAGNOSIS — G8929 Other chronic pain: Secondary | ICD-10-CM | POA: Insufficient documentation

## 2010-10-10 DIAGNOSIS — M25569 Pain in unspecified knee: Secondary | ICD-10-CM | POA: Insufficient documentation

## 2010-10-10 DIAGNOSIS — I872 Venous insufficiency (chronic) (peripheral): Secondary | ICD-10-CM | POA: Insufficient documentation

## 2010-10-10 DIAGNOSIS — M79609 Pain in unspecified limb: Secondary | ICD-10-CM

## 2010-10-10 DIAGNOSIS — M47817 Spondylosis without myelopathy or radiculopathy, lumbosacral region: Secondary | ICD-10-CM | POA: Insufficient documentation

## 2010-10-10 DIAGNOSIS — Z79899 Other long term (current) drug therapy: Secondary | ICD-10-CM | POA: Insufficient documentation

## 2010-10-11 NOTE — Assessment & Plan Note (Signed)
Blake Summers is a 65 year old gentleman who is accompanied by his wife today.  [facility] is [facility] at [facility] for chronic pain complaints related to his low back as well as his predominately right knee.  He has been followed in the past by Dr. Lequita Summers for his right knee. Last year September 28, 2009, he saw Dr. Lequita Summers and was told he was not an operative candidate due to the lower extremity edema as well as medical issues.  He has been managed with MS Contin as well as Percocet and Lidoderm patches.  He is back in today and continues to complain bitterly of right knee pain and low back pain.  Pain averaging about 8 on a scale of 10.  He is also concerned about his diminishing functional skills.  He cannot walk very far anymore due to shortness of breath and knee pain as well as back pain.  He is currently using a wheelchair for distances now.  Apparently Dr. Everardo Summers and Blake Summers ordered for a wheelchair.  Functional status.  He can walk not much more than 5 minutes, maybe even less at a time.  He is able to climb stairs and drive.  He is independent with feeding and toileting, needs assistance with dressing and bathing and household tasks.  He denies problems controlling bowel or bladder.  Denies depression, anxiety or suicidal ideation.  No other problems are noted today on the health and history form.  He will be following up with Dr. Everardo Summers in the next couple of weeks.  Medications [facility] through [facility] include MS Contin 15 mg twice a day, Percocet 7.5/500 mg 3 times a day and Topamax 50 mg twice a day as well as p.r.n. Lidoderm.  On exam today; his blood pressure is 137/67, pulse 92, respirations 18, 92% saturated on room air.  He is a morbidly obese gentleman who does not appear in any distress.  He is oriented x3.  Speech is clear. Affect is bright.  He is alert, cooperative and pleasant.  Follows commands without difficulty, answers  my questions appropriately. Cranial nerves and coordination are intact.  His reflexes are diminished in the lower extremities.  His motor strength is 5/5 at hip flexors, knee extensors, dorsiflexors, plantar flexors.  There is no abnormal tone, clonus or tremors noted.  He is able to transition from sitting to standing slowly.  He does not bear much weight through the right lower extremity due to knee pain.  Sensation is intact.  He has edema of both lower extremities.  He has tenderness along both medial and lateral joint line of the right knee.  I cannot confirm an obvious knee effusion due to excess tissue.  IMPRESSION: 1. Right knee pain secondary to osteoarthritis.  We will obtain an x-     ray of the knee to evaluate compartments, may consider custom     bracing. 2. Chronic low back pain secondary to lumbar spondylosis.  We will     obtain radiograph to flexion/extension views to determine if there     is any instability. 3. Chronic lower extremity swelling with a history of chronic venous     insufficiency.  PLAN:  Refill the following medicines for him today; MS Contin 15 mg 1 p.o. b.i.d. #60, Lidoderm 5% one to three patches as directed 12 hours on 12 hours off, Topamax 50 mg 1 p.o. b.i.d. 3 refills, Percocet 7.5/500 up to 3 times a day.  I have  encouraged Blake Summers to maintain contact with primary care physician.  We would like to consider getting him in some physical therapy again to work on transfers and ambulation.  We would like him to follow up with primary care prior to this.  He is in agreement as well. We would also like to see him transition back into pool program again. We have also discussed consideration of custom bracing for his right knee.  We have answered Summers his questions.  He is comfortable with our plan.     Blake Summers, M.D.    DMK/MedQ D:  10/10/2010 13:53:20  T:  10/11/2010 46:96:29  Job #:  [id number]

## 2010-10-12 ENCOUNTER — Ambulatory Visit (HOSPITAL_COMMUNITY)
Admission: RE | Admit: 2010-10-12 | Discharge: 2010-10-12 | Disposition: A | Discharge status: Home / Self Care | Payer: Medicare Other | Source: Ambulatory Visit | Attending: Physical Medicine and Rehabilitation | Admitting: Physical Medicine and Rehabilitation

## 2010-10-12 ENCOUNTER — Other Ambulatory Visit: Payer: Self-pay | Admitting: Physical Medicine and Rehabilitation

## 2010-10-12 ENCOUNTER — Other Ambulatory Visit: Payer: Self-pay | Admitting: Endocrinology

## 2010-10-12 DIAGNOSIS — R52 Pain, unspecified: Secondary | ICD-10-CM

## 2010-10-12 DIAGNOSIS — M545 Low back pain, unspecified: Secondary | ICD-10-CM

## 2010-10-12 DIAGNOSIS — M47817 Spondylosis without myelopathy or radiculopathy, lumbosacral region: Secondary | ICD-10-CM | POA: Insufficient documentation

## 2010-10-12 DIAGNOSIS — M1710 Unilateral primary osteoarthritis, unspecified knee: Secondary | ICD-10-CM | POA: Insufficient documentation

## 2010-10-12 DIAGNOSIS — M25569 Pain in unspecified knee: Secondary | ICD-10-CM | POA: Insufficient documentation

## 2010-10-12 DIAGNOSIS — N200 Calculus of kidney: Secondary | ICD-10-CM | POA: Insufficient documentation

## 2010-10-12 DIAGNOSIS — M25469 Effusion, unspecified knee: Secondary | ICD-10-CM | POA: Insufficient documentation

## 2010-10-21 ENCOUNTER — Other Ambulatory Visit: Payer: Self-pay | Admitting: Endocrinology

## 2010-10-25 ENCOUNTER — Ambulatory Visit (INDEPENDENT_AMBULATORY_CARE_PROVIDER_SITE_OTHER): Payer: Medicare Other | Admitting: Endocrinology

## 2010-10-25 ENCOUNTER — Encounter: Payer: Self-pay | Admitting: Endocrinology

## 2010-10-25 ENCOUNTER — Other Ambulatory Visit (INDEPENDENT_AMBULATORY_CARE_PROVIDER_SITE_OTHER): Payer: Medicare Other

## 2010-10-25 VITALS — BP 128/82 | HR 100 | Temp 98.7°F | Ht 64.0 in | Wt 369.0 lb

## 2010-10-25 DIAGNOSIS — E119 Type 2 diabetes mellitus without complications: Secondary | ICD-10-CM

## 2010-10-25 DIAGNOSIS — R0602 Shortness of breath: Secondary | ICD-10-CM | POA: Insufficient documentation

## 2010-10-25 DIAGNOSIS — E291 Testicular hypofunction: Secondary | ICD-10-CM

## 2010-10-25 DIAGNOSIS — R002 Palpitations: Secondary | ICD-10-CM

## 2010-10-25 LAB — HEMOGLOBIN A1C: Hgb A1c MFr Bld: 8 % — ABNORMAL HIGH (ref 4.6–6.5)

## 2010-10-25 NOTE — Assessment & Plan Note (Signed)
Blake Summers is a 65 year old married gentleman who is morbidly obese and  has complaints of low back and right knee pain.  He is being seen back  in our clinic today for a refill of his medications.   His average pain is about a 7 on a scale of 10.  Sleep is fair.  Pain is  described in the low back and knee as intermittent, sharp, stabbing and  aching in nature.  Pain is worse with walking and standing.  Improves  with rest, medication and injections.  He is getting fair to good relief  with current medications.   He states he has recently started a pool program as recommended at the  last visit.  He is starting to participate in an arthritis water program  at the [facility].  He states he gets quite tired after he participates in  this program, but it is not increasing his pain at all and he feels that  it is worthwhile and intends to stay in the program and see if he can  progress to an aerobic conditioning type program eventually.   He is able to walk about five minutes at a time.  He is limited by his  shortness of breath and his low back pain.  He is able to climb steps  one stair at a time.  He is able to drive.  He is independent with  feeding, bathing and toileting.  Needs some assistance with dressing,  meal prep and household duties, shopping.  Denies depression, anxiety or  suicidal ideation.  Denies problems controlling bowel or bladder.   REVIEW OF SYSTEMS:  Noncontributory.   PAST MEDICAL/SOCIAL/FAMILY HISTORY:  Unchanged as well.   MEDICATIONS PROVIDED BY OUR CLINIC:  1. MIS Contin 15 mg twice a day.  2. Percocet 7.5/500 one p.o. t.i.d.  3. Lidoderm on a p.r.n. basis.  4. Topamax 50 mg b.i.d.   EXAMINATION:  Blood pressure is 162/85, pulse 94, respiration 18, 98%  saturated on room air.  Blake Summers is a well developed, morbidly obese  gentleman who appears his stated age and does not appear in any  distress.  He is oriented x3.  Speech is clear.  His affect is bright.   He is  alert, cooperative and pleasant.  He follows commands easily.  He transitions from sitting to standing without difficulty today.  Gait  does not appear antalgic today.  He does have a bit of a wide-based gait  and short stride length however due to his girth.  He has limitations in lumbar motion in all planes.  Seated examination of his right knee:  He does not have tenderness in  this area today.  He states he has had a recent injection by Dr. Lequita Halt  to the knee and this has given him quite a bit of relief.  He has good strength in both lower extremities.  His reflexes are  diminished in both lower extremities as well, and he has normal tone and  no clonus.   IMPRESSION:  1. Chronic low back pain with  known L5-S1 foraminal narrowing      secondary to degenerative disk disease.  2. History of bilateral knee osteoarthritis worse on the right than on      the left.  3. Bilateral lymphedema secondary to obesity.  4. His medical problems include diabetes, hypertension, history of CVA      with right-sided involvement in the early 1990s, glaucoma, kidney  stones, sleep apnea, hypercholesterolemia and gout.   PLAN:  1. I will continue to encourage him to participate in the water      arthritis program that he is currently engaged in at the [facility].  2. We will refill the following medications for him today:  Percocet      7.5/500 one p.o. t.i.d. p.r.n. back or leg pain, Topamax 50 mg 1      p.o. b.i.d. #60 with three refills and MS Contin 15 mg 1 p.o. every      12 hours #60.   He has been stable on the above medications.  He takes the medications  as prescribed.  He has not exhibited any aberrant behavior.  He is  attempting to increase his activity level by participating in the water  arthritis program at the [facility].  We will see him back in a month.  He has  been stable on the above medications.           ______________________________  Brantley Stage, M.D.      DMK/MedQ  D:  09/11/2007 14:37:36  T:  09/11/2007 17:20:26  Job #:  [id number]   cc:   Gregary Signs A. Everardo All, MD  [address]  [place]  [place] [address]

## 2010-10-25 NOTE — Assessment & Plan Note (Signed)
[name] is a 65 year old morbidly obese, married gentleman, who is  followed in our pain and rehabilitative clinic for chronic low back pain  and right-knee pain.  He is also known to have diabetes, a history of  gout and hypertension and hypercholesterolemia.   He is complaining of low back pain again today, as well as right-knee  pain.  He is known to have lumbar spondylosis, as well as significant  degenerative changes in the right knee.  He is followed by Dr. Lequita Halt  for the right-knee pain and is getting intermittent knee injections.   His average pain is about a 6-7 on a scale of 10.  Pain is described as  intermittent, sharp, stabbing.  Pain is worse in his low back, when he  is up, walking; worse in the leg when he is up, walking or standing,  improves with rest, medications and injections.  Relief from medicines  is fair.   MEDICATIONS:  Medications provided by our clinic include:  1. Topamax 50 mg twice a day  2. Lidoderm p.r.n.  3. MS-Contin 50 mg b.i.d.  4. Percocet 7.5 500 t.i.d.   FUNCTIONAL STATUS:  Blake Summers is able to independently walk for about  five minutes.  He does use a scooter sometimes.  He is able to drive and  he is independent with his self-care and needs occasional assistance  with some dressing.   REVIEW OF SYSTEMS:  Noncontributory.   PAST MEDICAL/SOCIAL/FAMILY HISTORY:  Unchanged since our last visit.   EXAM TODAY:  Blood pressure is 179/87, pulse 90, respirations 20, 93%  saturated on room air.  Blake Summers is morbidly obese, does not appear in any distress.  He is  oriented times three.  Speech is clear.  Affect is bright.  He is alert,  cooperative and pleasant.  He follows commands easily.  Transitioning from sitting to standing is done with a little trouble,  due to his size.  When he is up, walking, he has a slightly wide-based  gait with a short stride length, does not appear to have any antalgia  today.  He does state his knee is  feeling a bit better.  Limitations are noted in lumbar forward flexion and he reports some  discomfort with extension.  He states he feels a little bit better and  does not have any pain with this maneuver.  Reflexes are diminished at the patellar and Achilles tendons.  He has no  abnormal tone.  His motor strength is quite good, 5/5 at hip flexors,  knee extensors, dorsiflexors, plantar flexors.  No new sensory deficits  are appreciated.   IMPRESSION:  1. Chronic low back pain with known L5-S1 nerve foraminal narrowing,      secondary to degenerative disc disease.  2. History of bilateral knee osteoarthritis, right worse than left.  3. Bilateral lymphedema, secondary to obesity.  4. Medical problems include diabetes, hypertension, history of CVA      with right-sided involvement in the early 1990s, glaucoma, kidney      stones, sleep apnea, hypercholesterolemia, and gout.   PLAN:  We will refill the following medications for him today.   1. Percocet 7.5/500 one p.o. t.i.d. #90, no refills.  2. MS-Contin 15 mg one p.o. b.i.d. #60, no refills.   We will see him back in a month.   I did discuss the importance of engaging in some sort of exercise  program to decrease his weight.  He states he will look  into the [facility]  water program.  I have also suggested possibly using [place] crutches  to unweight his knee, but he does not appear to be interested in this  option.   We will see him back in a month.  We will continue to encourage him to  try the water program.           ______________________________  Brantley Stage, M.D.     DMK/MedQ  D:  08/14/2007 13:06:45  T:  08/14/2007 14:27:26  Job #:  [id number]   cc:   Gregary Signs A. Everardo All, MD  [address]  [place]  [place] [address]

## 2010-10-25 NOTE — Assessment & Plan Note (Signed)
Ms. Riera is a 65 year old married gentleman, who is followed in our  [facility] for chronic low back pain and  predominantly right knee pain, which is related to osteoarthritis and  lumbar spondylosis.   He is back in today for refill of his medications.   In the interim, he reports he has received an injection into the right  knee by one of Dr. Deri Fuelling physician assistants.  He states the  injection was not as helpful as they have been in the past.  He has no  new medical problems, otherwise.   He continues to maintain contact with Dr. Shelle Iron, his pulmonologist, as  well as Dr. Everardo All.   He states his average pain is between a 6 and a 7 on a scale of 10, it  is intermittent, sometimes it is sharp and stabbing in nature, worse  with activities such as walking and standing, and improves with rest,  medication, and injections.   He gets fair-to-good relief with current medications prescribed through  this clinic.   FUNCTIONAL STATUS:  He is able to walk about 5 minutes at a time.  He  walks for the most part without any kind of assistive device.  Occasionally, he will use a cane.  He is independent with stairs and  driving as well.  Independent with feeding, bathing, and toileting and  occasionally needs some assistance with dressing, especially lower  extremity.   REVIEW OF SYSTEMS:  Notable for occasional numbness, tingling, trouble  walking, otherwise noncontributory.   No changes in past medical, social, or family history.   MEDICATIONS WHICH ARE PROVIDED THROUGH THIS CLINIC INCLUDE:  1. Topamax 15 mg 1 p.o. b.i.d.  2. MS Contin 15 mg 1 p.o. every 12 hours.  3. Percocet 7.5/500 three times a day.   PHYSICAL EXAMINATION:  VITAL SIGNS:  Blood pressure is 152/78, pulse 91,  respirations 18, and 94% saturated on room air.  GENERAL:  He is a morbidly obese gentleman who does not appear in any  distress.  He does appear his stated age.  He is oriented  x3.  Speech is  clear.  Affect is bright.  He is alert, cooperative, and pleasant.  He  follows commands easily.   Cranial nerves are grossly intact.  Coordination is intact.  Reflexes  are symmetric, but diminished in both lower extremities.  Sensation is  intact.  He has 5/5 strength with hip flexors, knee extensors,  dorsiflexors, and plantar flexors.  Straight leg raise is negative.   Transitioning from sitting to standing is done with some difficulty  getting out of the chair due to his size.  His gait is slightly wide  based, short stride length, minimally antalgic today.  He has  limitations noted in lumbar range of motion in all planes.  He reports  some minimal discomfort with forward flexion as well as extension.   Hip range of motion is functional and does not increase groin or  posterior hip pain.   IMPRESSION:  1. Chronic low back pain with L5-S1 foraminal narrowing secondary to      degenerative disk disease.  2. History of bilateral knee osteoarthritis, right is worse than the      left.  3. Bilateral lymphedema secondary to obesity, currently is in foot      stockings.   MEDICAL PROBLEMS:  Include the following; diabetes, hypertension,  history of cerebrovascular accident in 1990, history of glaucoma, kidney  stone, sleep apnea, hypercholesterolemia, gout,  and recent cataract  surgery.   PLAN:  Fill following medications for him;  1. Percocet 7.5/500 one p.o. t.i.d., #90.  2. MS Contin 15 mg 1 p.o. every 12 hours, #60, no refills on either of      these.  3. Topamax 15 mg 1 p.o. b.i.d., #60 with 3 refills.   We will see him back in 2 months for nursing visit and next month for  pill count and refill of medications.   Mr. Sebald continues to maintain a relatively active lifestyle.  He is  going to the pool at the [facility] 3 times a week, typically at least for an  hour.  His back pain and knee pains are managed with narcotic medication  to help maintain a  somewhat functional lifestyle for him.  He takes his  medications as prescribed.  No aberrant behavior has been observed with  their use with him.  We will continue to monitor his pill counts and his  narcotic usage.  He has had no problems with oversedation or  constipation or other side effects from these medications.  I will see  him back in 2 months and nursing visit next month.           ______________________________  Brantley Stage, M.D.     DMK/MedQ  D:  03/11/2008 10:40:49  T:  03/12/2008 02:05:38  Job #:  [id number]   cc:   Gregary Signs A. Everardo All, MD  [address]  [place]  [place] [address]

## 2010-10-25 NOTE — Assessment & Plan Note (Signed)
[name] is a 65 year old married gentleman who is followed in our  pain and rehabilitative clinic for pain in his low back and right knee  and he is also morbidly obese.   He was last seen by me on September 11, 2007.   His average pain is about a 7 on a scale of 10.  It is intermittent,  depends on whether he is up standing and walking.  Sleep is fair.  He  gets fair relief with current meds that he is on.  He is able to walk  about 5 minutes at a time.  His walking is limited by his shortness of  breath and endurance.   Functional status is as follows:  He is able to climb stairs a few at a  time.  He is able to drive.  He is independent with feeding, bathing,  and toileting, needs a little assistance with dressing, meal prep and  household duties, shopping.   He has been participating in a pool program 1 hour a day 3 times a week.  He states it has been strenuous for him but he seems to be enjoying it.  He feels as though his overall endurance is improving.   REVIEW OF SYSTEMS:  Otherwise noncontributory.   PAST MEDICAL, SOCIAL, FAMILY HISTORY:  Unchanged from last visit.   MEDICATIONS FROM THIS CLINIC:  Include:  1. MS Contin 15 mg twice a day.  2. Percocet 7.5/500 up to 3 times a day p.r.n.  3. Lidoderm on a p.r.n. basis.  4. Topamax 50 mg b.i.d.   EXAM:  His blood pressure is 166/84.  Pulse 92.  Respiration 20, 94%  saturated on room air.  He is a morbidly obese gentleman who does not  appear in any distress.  He is oriented x3.  Speech is clear.  His  affect is bright.  He is alert, cooperative, and pleasant.  He follows  commands without difficulty.   Transitioning from sitting to standing is done with ease.  Gait in the  room is normal.  He has a very minimal antalgic gait.  Notes slightly  decreased weightbearing on the right lower extremity.   Lumbar motion is limited as well.  Reflexes are diminished in the lower  extremities.  Tone is normal.  Motor strength is  5/5 without focal  deficit.   IMPRESSION:  1. Chronic low back pain with known L5-S1 foraminal narrowing      secondary to degenerative disk disease.  2. History of bilateral knee osteoarthritis, worse on the right than      on the left.  3. Bilateral lymphedema secondary to obesity.  4. His medical problems include diabetes, hypertension, history of      cerebrovascular accident with right-sided involvement in the early      1990s, history of glaucoma, kidney stones, sleep apnea,      hypercholesterolemia, and gout.   PLAN:  We will refill the following medications for him today:  MS  Contin 15 mg 1 p.o. every 12 hours, #60, no refills, Percocet 7.5/500  one p.o. t.i.d. p.r.n. back or knee pain, #90, no refills.  We will see  him back in a month.  I  have given him an activity log to monitor his pool program.  He is  currently going 1 hour a day for 3 days a week.  I have encouraged him  to continue this.  He has been doing very well with it.  We will  see him  back in a month.           ______________________________  Brantley Stage, M.D.     DMK/MedQ  D:  10/09/2007 14:09:00  T:  10/09/2007 14:36:13  Job #:  [id number]   cc:   Gregary Signs A. Everardo All, MD  [address]  [place]  [place] [address]

## 2010-10-25 NOTE — Assessment & Plan Note (Signed)
Blake Summers is a pleasant 65 year old gentleman who is the patient of Dr.  [name].   Blake Summers is being followed in our Pain and Rehabilitative Clinic for  chronic pain complaints related to low back pain and predominantly right  knee pain.  He is known to have osteoarthritis as well as lumbar  spondylosis.   He is also been followed by Dr. Antony Odea for the right knee and has  obtained periodic injection of his right knee.  He states the last  injection was not particularly helpful for him, however.   He was last seen by me on May 11, 2008.  In the interim, he states  that he has had no medical problems or changes in his medical status.  His average pain 5-7 on a scale of 10.  The pain is typically worse in  the daytime, when he is up walking and standing, improves when he is  resting and with medication.  Injections also seemed to be a benefit for  him especially the sacroiliac joint injection that he has had in the  past.   FUNCTIONAL STATUS:  He is able to walk about 5 minutes at a time.  He  can climb stairs.  He is able to drive.  He uses cane and a walking  stick.  He is independent with feeding, bathing, and toileting.  Occasionally, need some assistance with lower extremity dressing  especially with his compression hose.   REVIEW OF SYSTEMS:  Positive for occasional numbness in the lower  extremities, trouble walking.  Denies depression, suicidal ideation, or  anxiety.  Denies problems controlling bowel or bladder.   REVIEW OF SYSTEMS:  Otherwise, noncontributory.   Past medical, social, or family history are unchanged since previous  visit.   MEDICATIONS:  Medications provided to our Pain and Rehabilitative Clinic  include,  1. Topamax 50 mg twice a day.  2. MS Contin 15 mg q.12 h.  3. Percocet 7.5/500 three times a day on a p.r.n. basis.   PHYSICAL EXAMINATION:  Today, blood pressure is 134/58, pulse 110,  respirations 80, 91-92% saturate on room air.   Blake Summers is a morbidly obese gentleman who is not appearing in any  distress.  He is oriented x3.  Speech is clear.  His affect is bright.  He is alert, cooperative, and pleasant.  He follows commands without  difficulty and answers all questions appropriately.   Cranial nerves and coordination are grossly intact.  His reflexes are 1+  in the lower extremities.  No abnormal tone is noted.  No clonus is  noted.  Sensory exam is intact to light touch.  Motor strength is 5/5 at  hip flexors, knee extensors, dorsiflexors, and plantar flexors.   Internal and external rotation does not increase groin pain or posterior  hip pain.   He has limitations in lumbar motion in all planes.   He has tenderness along the joint line especially with the right knee.   IMPRESSION:  1. Bilateral knee osteoarthritis right worse than left.  2. Chronic low back pain with lumbar spondylosis and foraminal      narrowing at L5-S1.  3. History of lymphedema secondary to B3 currently using compression      stocking.  4. Medical problems include diabetes, hypertension, history of      cerebrovascular accident in 1990, history of glaucoma, kidney      stones, sleep apnea, hypercholesterolemia, gout, and cataract      surgery.  PLAN:  We will refill the following medications for him today including  MS Contin 15 mg one p.o. b.i.d. #60, and Topamax 50 mg one p.o. b.i.d.  #60, and Percocet 7.5/500 one p.o. t.i.d. p.r.n. knee or back pain #90,  no refills.   Mr. Uttech has continued to take his medication as prescribed.  He does  not exhibit any aberrant behavior with the use.  He does not have any  problems with oversedation or constipation from their use.   He continues to maintain his relatively active lifestyle despite  significant orthopedic impairment.  He continues to go to the [facility], 2-3  times a week in the water program there.  His pill counts are  appropriate.   I encouraged him to follow up with  Dr. Antony Odea for his right knee.  We  will see him back in 1 month.           ______________________________  Brantley Stage, M.D.     DMK/MedQ  D:  06/17/2008 13:26:07  T:  06/18/2008 04:38:31  Job #:  [id number]   cc:   Gregary Signs A. Everardo All, MD  [address]  [place]  [place] [address]

## 2010-10-25 NOTE — Procedures (Signed)
NAMEHENRY, UTSEY               ACCOUNT NO.:  [id number]   MEDICAL RECORD NO.:  [id number]          PATIENT TYPE:  REC   LOCATION:  [facility]                          FACILITY:  [facility]   PHYSICIAN:  Erick Colace, M.D.DATE OF BIRTH:  11-23-1945   DATE OF PROCEDURE:  DATE OF DISCHARGE:                               OPERATIVE REPORT   PROCEDURE:  Right sacroiliac injection.   INDICATIONS:  Right-sided back and buttock pain.  He has had good relief  in the past.  Last procedure done some time over a year ago.   Pain is only partially responsive to medication management and other  conservative care, interferes with activities.   Informed consent was obtained after describing the risks and benefits of  the procedure with the patient.  These include bleeding, bruising, and  infection.  He elects to proceed and has given written consent.  The  patient was placed prone on fluoroscopy table.  Betadine prep, sterile  draped.  A 25-gauge 1-1/2-inch needle was used to anesthetize the skin  and subcutaneous tissue with 1% lidocaine x2 mL.  Then, a 22-gauge 5-  inch spinal needle was inserted under fluoroscopic guidance targeting  the right SI joint.  AP, lateral, and oblique images utilized.  Omnipaque 180 under live fluoro demonstrated no intravascular uptake and  a solution-containing of 1 mL of 2% MPF lidocaine and 0.5% mL of 40  mg/mL Depo-Medrol were injected.  The patient tolerated the procedure  well.  Pre- and post-injection and vitals stable.  Post-injection  instructions were given.      Erick Colace, M.D.  Electronically Signed     AEK/MEDQ  D:  11/23/2008 14:44:49  T:  11/24/2008 05:36:19  Job:  [id number]

## 2010-10-25 NOTE — Assessment & Plan Note (Signed)
[name] is back in today for a refill of his medications.  He is a 65-  year-old gentleman who is being followed in our pain and rehabilitative  clinic for chronic low back pain, right knee pain.  He is requesting  some papers to be filled out for handicapped tag today as well.   He states he has a followup appointment with Dr. Everardo Summers next month for  routine issues only, no other medical issues have been noted by him over  the last month.  Average pain is about a 6 on a scale of 10.  Pain  typically located in the low back and right knee.   He gets good relief from the medications prescribed by this clinic which  include the following:  1. MS Contin 15 mg one p.o. b.i.d.  2. Oxycodone 7.5/500 one p.o. t.i.d.  3. Topamax 50 mg one p.o. b.i.d.  4. Lidoderm on a p.r.n. basis, 12 hours on, 12 hours off.   He is employed and works 40 hours a week.  He just got back from a 2  week vacation.  He was up in [place], he and his wife drove up there.   Health and history form regarding review of systems is noncontributory  today.  Past medical, social, family history otherwise negative for any  new issues.   EXAMINATION:  Today blood pressure is 165/70, pulse 99, respirations 20,  95% saturated on room air.  He is a morbidly obese gentleman who does  not appear in any distress.  He appears his stated age.  He is oriented  x3.  Speech is clear.  Affect is bright, alert, cooperative and  pleasant.  Transitions from sit to stand with a little difficulty  getting out of his chair.  Gait in the room is wide based, short stride  lengths, slightly antalgic, decreased weightbearing to the right lower  extremity.   __________.  His reflexes are 1+ patellar tendon, 0 at the Achilles'  tendons.  Motor strength is good in both lower extremities, no focal  weakness is appreciated.  He may have a slight effusion in the right  knee today.  No instability is appreciated AP or mediolateral.  He does  have pain along the medial joint line and he is wearing his compression  hose as well.   IMPRESSION:  1. Bilateral knee osteoarthritis.  Severe right medial joint      osteoarthritis, right greater than the left.  2. L5-S1 neural foraminal narrowing, secondary to degenerative disk      disease.  3. Sacroiliac joint pain.  4. History of exertional shortness of breath.  5. History of kidney stones.   PLAN:  Refill the following medications for him today:  1. Topamax 50 mg one p.o. b.i.d., 3 refills.  2. Oxycodone 7.5/500 one p.o. t.i.d. #90.  3. MS Contin 15 mg one p.o. b.i.d. #60, no refills.   We will see him back in 5 weeks.  He is planning to see a physical  therapist on July 8 for scooter evaluation.  His last knee injection was  approximately 3 months ago by Dr. Lequita Halt.  He has been stable on the  above medications,  displayed no aberrant behavior.  He maintains a high level of function  working 40 hours a week and independent with his self care for the most  part.  We will see him back in a month.           ______________________________  Brantley Stage, M.D.     DMK/MedQ  D:  12/06/2006 11:07:31  T:  12/06/2006 12:02:29  Job #:  [id number]   cc:   Blake Signs A. Everardo All, MD  [address]  [place]  [place] [address]

## 2010-10-25 NOTE — Assessment & Plan Note (Signed)
[name] is a 65 year old gentleman who has followed in our Pain and  Rehabilitative Clinic.  He is a patient of Dr. [name].   He was last seen by me on June 17, 2008.   In the interim, he states that he is currently being treated for a  bronchitis by Dr. Everardo All.  He has been on some antibiotics.  He does  not have the name of them with him today.   Over the last couple of weeks, he has been unable to participate in his  pool program which he had been going to regularly.  The bronchitis has  made it difficult for him to go to the [facility] as well as the weather  recently has made it difficult too.   Average pain is about a 7 on a scale of 10.  His right knee is bothering  him the most today.  He continues to have chronic low back pain as well.  Average pain is about 7 on a scale of 10, described as intermittent,  sharp, stabbing in nature, worse with activity, prolonged standing or  walking, improves with rest, medication, and injections.   FUNCTIONAL STATUS:  He is able to walk about 5 minutes at a time.  He  can climb one stair step at a time.  He does drive.  He uses a walking  stick.   He is independent with self-care, needs some assistance getting his  lower extremity compression hose on.   Occasionally notes numbness, tingling, trouble walking.  Otherwise,  review of systems is negative.   No other changes in past medical, social or family history since last  visit.  He states he does have an appointment Dr. Lequita Halt, orthopedic  surgeon, last week of this month for possible injection of his right  knee again.   Medications provided through this clinic include:  1. Topamax 50 mg twice a day.  2. MS Contin 15 mg q.12 h.  3. Percocet 7.5/325 three times a day p.r.n. back and knee pain.   PHYSICAL EXAMINATION:  Today, blood pressure is 150/85, pulse 104,  respiration 22, 91% saturated on room air.  He is a morbidly obese  gentleman who does not appear in any  distress.   He is oriented x3.  Speech is clear.  Affect is bright.  He is alert,  cooperative, and pleasant.  Follows commands without difficulty.  Answers all questions appropriately.   Cranial nerves are grossly intact.  Coordination is intact.  Reflexes  are diminished at the patellar and Achilles tendons bilaterally.  Sensation is intact.  Motor strength is 5/5 at hip flexors, knee  extensors, dorsiflexors, plantar flexors.   Straight leg raise is negative.   Mild tenderness noted along the right medial joint line.   IMPRESSION:  1. Bilateral knee osteoarthritis, right worse than left.  2. Chronic low back pain with lumbar spondylosis and foraminal      narrowing at L5-S1.  3. History of lymphedema secondary to morbid obesity, currently using      compression stockings.  4. Medical problems include diabetes, hypertension, history of      cerebrovascular accident in 1990, history of glaucoma, kidney      stones, sleep apnea, hypercholesterolemia, gout, and cataract      surgery.   I will refill the following medications for him today:  MS Contin 15 mg  1 p.o. q.12 h. #60, and Percocet 7.5/500 p.o. t.i.d. p.r.n. knee pain  #90 no refills.  He does not need a refill on the Topamax today.   He takes his medications as prescribed.  He does not exhibit any  aberrant behavior with their use.  He has any problems oversedation or  constipation.   He continues to maintain a relatively active lifestyle despite  significant orthopedic impairment.   Pill counts have been appropriate.   We will see him back in 2 months, continue to maintain contact with  primary care, Dr. [name].           ______________________________  Brantley Stage, M.D.     DMK/MedQ  D:  07/15/2008 11:19:57  T:  07/16/2008 00:28:33  Job #:  [id number]   cc:   Gregary Signs A. Everardo All, MD  [address]  [place]  [place] [address]

## 2010-10-25 NOTE — Assessment & Plan Note (Signed)
DATE OF EVALUATION:  April 05, 2007   [name] is a 65 year old married gentleman who is followed in our  pain and rehabilitation clinic for chronic low back pain and right knee  pain. He is back in today for a brief recheck.  He has at least a 2-week  supply of his medications and will not need a prescription today.   Since I have seen him he has obtained a scooter for long distances.  He  does have complaints of shortness of breath and back and knee pain which  limits his mobility overall.   Average pain is about a 7 on a scale of 10.  He reports fair relief with  the medications that he is prescribed. Pain improves with rest,  medication and  injections.  He has been getting injections for the  right knee by Dr. Lequita Halt about every 3 months and does have significant  improvement after the injections.   The pain is worse with walking and standing. Pain is described as  intermittent stabbing and sharp.   Mornings are particularly difficult for him. He is able to walk between  7 and 10 minutes at a time.  He is able to climb stairs. He is able to  drive.   He is working 40 hours a week but does plan to retire in February, 2008.   REVIEW OF SYSTEMS:  Noncontributory today. No changes in past medical,  social or family history.   MEDICATIONS PRESCRIBED BY OUR CLINIC:  Include morphine sulfate 15 mg  extended release twice a day, Oxycodone/acetaminophen 7.5/500 x 3 daily,  Topamax 50 mg twice daily, and Lidoderm 5% p.r.n.   EXAMINATION:  Today his blood pressure is 130/66, pulse 95, respiration  16, 95% saturated on room air.  He is a morbidly obese gentleman who  does not appear in any distress.  He is oriented x3, speech is clear,  affect is bright, he is alert, cooperative and pleasant. Follows  commands without difficulty.   Transitions from sit to stand without difficulty. Short stride lengths  in the room do seem to cause him to be somewhat out of breath, no chest  pain is reported however.   Stride length is slightly wide-based, short, slightly antalgic,  decreased weight bearing in the right lower extremity.   Lumbar motion is limited in all planes.   Reflexes are diminished in the lower extremities.  Straight leg raise is  negative. Motor strength is good without focal deficit, internal,  external rotation at the hips does not increase his pain.  His right  knee was not examined specifically today.   IMPRESSION:  1. L5-S1 neural foraminal narrowing secondary to degenerative disk      disease.  2. History of bilateral knee osteoarthritis, right worse than left.   Medical problems include exertional shortness of breath, kidney stones,  lymphedema.   We did not need to refill his medication today, he will stop back to our  clinic in 2 weeks and pick up the prescriptions at that time. He has not  had any problems with constipation currently.  No problems with  oversedation.  He takes his medications as prescribed and does not exhibit any aberrant  behavior with them.  Will see him back in a month.           ______________________________  Brantley Stage, M.D.     DMK/MedQ  D:  04/05/2007 09:31:45  T:  04/05/2007 19:21:31  Job #:  [id number]  cc:   Sean A. Loanne Drilling, [place]  [place] [address]

## 2010-10-25 NOTE — Assessment & Plan Note (Signed)
Blake Summers is a 65 year old married gentleman who is back into our pain  and rehabilitative clinic today for refill of his medications.   He has complaints of continued low back pain and right knee pain  especially.  He is followed by Dr. Lequita Summers and is getting p.r.n.  injections into his right knee.   Sleep tends to be fair.  He is getting good relief with the medications  provided by our clinic.  His pain is on the average between a 6/10 and a  7/10.  Described as intermittent, sharp, and stabbing in nature.  He is  able to walk about 5 minutes at a time.  He is able to climb stairs, and  he is able to drive.  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  He is poised to retire in the next several weeks.   He needs assistance with dressing, especially getting on his compression  stockings.  Otherwise, he is independent with feeding, bathing, and  toileting.   REVIEW OF SYSTEMS:  Noncontributory.  Denies depression, anxiety, or  suicidal ideation.   Denies any side effects of the medications provided by our clinic, such  as oversedation or constipation.   MEDICATIONS FROM THIS CLINIC:  1. MS-Contin 50 mg 1 p.o. b.i.d.  2. Percocet 7.5/500 one p.o. t.i.d. p.r.n.  3. Lidoderm 5% patch on a p.r.n. basis.  4. Topamax 50 mg 1 p.o. b.i.d.   PAST MEDICAL/SOCIAL/FAMILY HISTORY:  Noncontributory today.  He does  bring in some lab results done in Dr. George Summers office including lipid  profile, CBC with diff, basic metabolic panel, hepatic panel, PSA, and  TSH.  These are attached to the chart.   EXAM:  His blood pressure is 123/60, pulse 94, respirations 18, 96%  saturation on room air.  He is a well-developed, well-nourished, morbidly obese gentleman who  does not appear in any distress.  He is oriented x3.  Speech is clear,  affect is bright.  He is alert, cooperative, and pleasant.  He follows  commands without any problems.  Transitions from sitting to  standing  without difficulty.  Gait is wide-based secondary to his large size,  shorter stride length.  He has a bit of antalgia, especially in the  right lower extremity because of the right knee pain.   Limitations are noted in lumbar range in Summers planes.   Reflexes are diminished in the lower extremities.  No abnormal tone is  noted.  No clonus is noted.  He has good strength in the lower  extremities, 5/5 at the hip flexors, knee extensors, dorsiflexors,  plantar flexors, and EHL.  He does have increased swelling in the left  lower extremity in the calf compared to the right, 41 cm 14 cm from the  inferior patellar pole on the right compared to 45.5 cm in the left calf  14 cm from the inferior pole of the patella.   He does not have any tenderness in the calf.  He does have pitting edema  in both lower extremities.  He does not have any pain with flexion or  extension of the ankle with dorsiflexion or plantar flexion.  No  reddened areas are noted in the calf as well.   IMPRESSION:  1. L5-S1 neural foraminal narrowing secondary to degenerative disk      disease.  2. History of bilateral knee osteoarthritis right worse than left.  3. Bilateral lymphedema, worse on the left than  on right.   MEDICAL PROBLEMS:  Include exertional shortness of breath, history of  kidney stones.   PLAN:  We will refill the following medications for him today.  MS-  Contin 50 mg 1 p.o. b.i.d. #60.  Lidoderm patch p.r.n.  Percocet 7.5/500  one p.o. t.i.d. p.r.n. low back or knee pain.  Topamax 50 mg 1 p.o.  b.i.d. #60.  Will have him follow up with nursing staff next month for a  refill of his medications, and I will see him back in 2 months.  He has  been stable on these medicines and has not exhibited any aberrant  behavior.  He takes his medications as directed, and is getting good  relief with them, and is able to maintain a fairly functional lifestyle  despite impairments in his back and his  knees.           ______________________________  Blake Summers, M.D.     DMK/MedQ  D:  05/17/2007 12:13:55  T:  05/17/2007 14:38:26  Job #:  [id number]   cc:   Blake Signs A. Everardo All, MD  [address]  [place]  [place] [address]

## 2010-10-25 NOTE — Assessment & Plan Note (Signed)
Blake Summers is a 65 year old morbidly obese gentleman who is followed in  our [facility] for multiple chronic pain complaints.  His main problem today are right low back pain and continued right knee  pain.   He has been seeing Dr. Antony Odea over the last couple of years for  intermittent knee injections.  He has had some recent injections in the  last month or so and reports overall improvement in his knee.   He is wondering if there is anything he can take besides daily Aleve to  help his low back pain.  Average pain is about 7 on a scale of 10, worse  when he is up walking and prolonged and standing for a while, radiates  into the right buttock region.   Blake Summers has undergone bilateral sacroiliac joint injections in the  past with some relief.  Epidural steroids have not helped in that much.  I do not believe he has had medial branch blocks at this point.   FUNCTIONAL STATUS:  He typically uses a walking stick or a cane.  He has  difficulty with stairs goes up one at a time.  He is able to drive.  He  does use a scooter for community distances.   He is independent with self-care except he needs some assistance with  his compression stockings.   He has had more problems with swelling in the left leg recently.  His  wife had some recent surgery and has been unable to help him get his  stockings on and he has seen Dr. Everardo All who did some blood work on him  including also checking a Doppler ultrasound to rule out a blood clot  apparently.  I do not have notes.  This is all per Blake Summers history.  Apparently, the Doppler was negative for blood clot.   REVIEW OF SYSTEMS:  Otherwise negative other than that previously  described.  No changes in past medical, social, or family history.   MEDICATIONS PROVIDED THROUGH THIS CLINIC:  1. MS Contin 50 mg 1 p.o. b.i.d.  2. Percocet 7.5/500 up to 3 times a day.  3. Topamax 50 mg twice a day.   PHYSICAL EXAMINATION:   Blood pressure is 162/81, pulse 111, respirations  20, 91% saturated on room air.  He is morbidly obese gentleman who does  not appear in any distress.  He is oriented x3.  Speech is clear.  Affect is bright.  He is alert, cooperative, and pleasant.  Follows  commands without difficulty, answers questions appropriately.   Cranial nerves and coordination are grossly intact.  His reflexes are  diminished at the patellar and Achilles tendons bilaterally.  Sensation  is intact.  He has good motor strength with hip flexion, knee extension,  dorsiflexion, and plantar flexion, EHL.  No focal weakness is noted.   Transitioning from sitting to standing is done with some difficulty due  to his size.  His gait in the room displays an antalgic gait which is  wide based, short stride length.  He has limitations in lumbar motion in  all planes.  Pain with some extension.  Tenderness to palpation over the  right sacroiliac joint is appreciated.   Tenderness is also noted along the right knee medial joint line.   IMPRESSION:  1. Bilateral knee osteoarthritis, right worse than left.  2. Chronic low back pain with lumbar spondylosis and foraminal      stenosis at L5-S1.  3. History of  lymphedema secondary to morbid obesity currently using      compression stockings.   Recent workup for swelling in the left leg included some blood work as  well as Doppler per the patient's history.  The patient's wife is  feeling better and he can now use his compression stockings again.  There was a difference in leg circumference 15 cm below the knee, 44.5  cm on the left, and 41.5 on the right was noted today on exam.   His medical problems include diabetes, hypertension, history of  cerebrovascular accident in 1990, history of glaucoma, kidney stones,  sleep apnea, hypercholesterolemia, gout, and cataract surgery in the  past.   PLAN:  Long discussion regarding treatment options for his pain.  He is  already  on long-acting narcotic MS Contin 15 mg twice a day.  He has  short-acting Percocet 7.5/500 where he can take 3 times a day.  He takes  p.r.n. Aleve.  Typically, he is on Topamax.   In the past, we have attempted to discontinue Topamax; however, he had a  significant worsening of his leg pain and was restarted back on it.   He has had epidural steroids in the past which have not been that  helpful.  However, a sacroiliac joint in the past done bilaterally did  seem to given him 50% relief back in 2008 or early 2009.   He would like to repeat the sacroiliac joint injection again.  We will  get him set up for that.  If does not give him significant relief, we  would also trial medial branch blocks with him to see if we can help  with low back pain.  I really would like not to escalate any of his  current oral medications.  He already has a complicated medication list  which is attached to the chart.  We will see him back in a month.           ______________________________  Brantley Stage, M.D.     DMK/MedQ  D:  10/07/2008 10:44:23  T:  10/07/2008 81:19:14  Job #:  [id number]   cc:   Gregary Signs A. Everardo All, MD  [address]  [place]  [place] [address]

## 2010-10-25 NOTE — Assessment & Plan Note (Signed)
Blake Summers is a 65 year old married gentleman, who is followed in our  [facility] for chronic low back pain and right knee  pain.   He is back in today for a refill of his pain medications.  He continues  to get between fair and good relief with his medications.  His pain is  predominantly in the right knee as well as the low back.  Average pain  is between a 6 and a 7 on a scale of 10 with medications.  The pain is  described as intermittent, stabbing and aching.  Worse when he is up  walking, improves with rest, heat, medications and knee injections.   FUNCTIONAL STATUS:  Mr. Milbourne is able to walk not much more than 5  minutes at a time.  His ambulation is limited by his shortness of  breath, obesity, knee and back pain.  He is able to climb stairs and he  is able to drive.   He is independent with his self care and needs some assistance with  lower extremity dressing.  Is able to participate in making meals.  Needs some assistance with higher level activities in the home such as  cleaning and grocery shopping.   REVIEW OF SYSTEMS:  Noncontributory.  Denies problems controlling bowel  or bladder.  Denies depression, anxiety, suicidal ideation.   PAST MEDICAL, SOCIAL, FAMILY HISTORY:  Otherwise unchanged.   PAST MEDICAL HISTORY:  Remarkable for diabetes, hypertension, history of  CVA with right-sided involvement in the early 1990s, glaucoma, kidney  stones, sleep apnea, hypercholesterolemia and gout.   MEDICATIONS PRESCRIBED BY OUR CLINIC:  1. Topamax 50 mg 1 p.o. b.i.d. p.r.n.  2. Lidoderm MS Contin 15 mg 1 p.o. b.i.d.  3. Percocet 7.5/500 one p.o. t.i.d.   EXAMINATION:  VITAL SIGNS:  Blood pressure 148/63, pulse 108,  respirations 18, 95% saturated on room air.  He is an obese gentleman  who appears his stated age.  He does not appear in any distress.  He is  oriented x3.  His speech is clear.  His affect is bright.  He is alert,  cooperative and  pleasant, and he follows commands without difficulty.   Transitioning from sitting to standing is done without much difficulty.  Gait in the room is wide based and displays a short stride length,  slightly antalgic, decreased weightbearing in the right lower extremity.   Reflexes are diminished in the lower extremity  No abd normal tone is  noted.  No clonus is noted.  Motor strength is good and hip flexors,  knee extensors, dorsiflexors, plantar flexors, EHL.  Tenderness along  joint line in the right knee.  No obvious effusion is appreciated.   He has his compression hose on both lower extremities today.  These were  not taken off for exam.  He has a history of pitting edema, however.   IMPRESSION:  1. Chronic low back pain with known L5-S1 neuroforaminal narrowing      secondary to degenerative disk disease.  2. History of bilateral knee osteoarthritis, right worse than left.  3. Bilateral lymphedema secondary to obesity.  4. Medical problems as noted above.   PLAN:  We will refill his Percocet 7.5/500 one p.o. t.i.d. #90, no  refills p.r.n. knee and back pain.  We will also refill his MS Contin 15  mg 1 p.o. b.i.d. #60, and Lidoderm p.r.n., Topamax 50 mg 1 p.o. b.i.d.  We will see him back in a month.  He is also given some Biofreeze  samples today to apply to his back and knee on a p.r.n. basis as well.           ______________________________  Brantley Stage, M.D.     DMK/MedQ  D:  07/17/2007 13:03:42  T:  07/17/2007 20:49:14  Job #:  [id number]   cc:   Gregary Signs A. Everardo All, MD  [address]  [place]  [place] [address]

## 2010-10-25 NOTE — Assessment & Plan Note (Signed)
Blake Summers is a pleasant 65 year old gentleman, who is followed in our  [facility] predominately for chronic low back pain  and a right knee pain.  He recently underwent sacroiliac joint injection  which he states has really given him quite a bit of relief in the back.  He believes he has gotten about 60% improvement in the right posterior  hip region with this injection.   He states, however, he does have a new problem.  He about 9 days ago, he  was in the bathroom and he reached for a towel on the towel rack and he  twisted and turned and felt a pop in his posterior right thigh and which  was followed by rather severe pain over the next few days such that he  had some difficulty walking on that leg.  Denies any fall or denies any  kind of traumatic event other than there is a twisting injury in the  bathroom.  He did not fall at that time.  He did hit it on anything.  He  cannot recall any other traumatic events of this right lower extremity.  He is still limping on the leg he states.  He feels like he pulled a  muscle.   Average pain is about 8 on the scale of 10 predominantly because of the  right posterior thigh pain.  Denies any new numbness, tingling,  weakness.  Denies any problems with controlling bowel or bladder that is  new.   No other medical changes are noted by [name] since the last visit  with him.   He continues to use a straight cane to walk.  He is able to climb stairs  1 at a time.  He is able to drive.  He is independent with self- care,  needs some assistance with lower extremity, dressing at times especially  to help him manage his compression stockings.   Denies depression, anxiety, or suicidal ideation.  No other medical  problems noted since last visit.   Medications which are prescribed through this clinic include:  1. MS Contin 15 mg twice a day.  2. Percocet 7.5/500 up to 3 times a day.  3. Topamax 50 mg twice a day.   Exam,  blood pressure 161/77, pulse is 99, respirations 18, and 96%  saturated on room air.  He is a well-developed, morbidly obese  gentleman, who does not appear in any distress.  He is oriented x3.  Speech is clear.  Affect is bright.  He is alert, cooperative, and  pleasant.  Follows commands without difficulty.  Answers my questions  appropriately.   He has some difficulty transitioning from sitting to standing.  His gait  is quite antalgic with decreased weightbearing in the right lower  extremity.  He has, however, good motor strength in the lower extremity  on the right, 5/5 hip flexion, knee extension, knee flexion,  dorsiflexion, and plantar flexion.  No new sensory deficits are noted.  There is no anterior, posterior, or medial lateral ligamentous laxity  noted.  There is no increased joint line tenderness.  He has full  extension.  No effusion is appreciated.  Palpation posteriorly reveals  some exquisite tenderness approximately 20 cm from the popliteal fossa  in the body of the lateral hamstring.   IMPRESSION:  1. Status post right hamstring strain.  2. Bilateral knee osteoarthritis right worse than left.  3. Chronic low back pain with lumbar spondylosis and foraminal  stenosis at L5-S1.  4. History of lymphedema secondary to morbid obesity.  5. Most likely sacroiliac component to low back pain as the patient      improves 60% after sacroiliac joint injection recently.   Medical problems include diabetes, hypertension, history of CVA in 1990,  history of glaucoma, kidney stones, sleep apnea, hypercholesterolemia,  gout, and cataract surgery.   PLAN:  Refill the following medications, Percocet 7.5/500 one p.o.  t.i.d. p.r.n. back pain or knee pain #90, morphine sulfate slow release  15 mg one p.o. b.i.d. #60.  I will see him back next month.  He is also  on Topamax and he is tolerating all the above medications well.  No  problems with oversedation or constipation.  He  takes his medications as  prescribed.  No evidence of aberrant behavior appreciated.   Still his knee continued to bother him.  We will consider radiographs  and possibly orthopedic referral.  Overall, he states the posterior  thigh knee is getting better.           ______________________________  Brantley Stage, M.D.     DMK/MedQ  D:  11/27/2008 13:10:45  T:  11/28/2008 04:31:07  Job #:  [id number]

## 2010-10-25 NOTE — Assessment & Plan Note (Signed)
[name] is a 65 year old gentleman who has followed in our [facility] for chronic low back pain and right knee pain.  He  is back in today for refill of his medications and a brief recheck to  monitor his cell counts and his pain inventory.   He states the average pain is about 7-8 on a scale 10.  Sleep is fair.  Pain is typically worse with walking and standing.  Improves with rest.  He is getting fair-to-good relief with current meds that he uses from  this clinic for management of his pain.  He also brings in his activity  sheet today.  He has continued to follow through in his Pool Arthritis  Aerobic Program.  He has been going from every other day to about every  3 days over the last month or so.  He states that overall when he works  out in the pool, he feels much better and he expresses overall positive  statements regarding getting a bit more active.  He is able to walk  about 3-5 minutes at a time.  He is able to climb stairs, but 1 at a  time.  He is able to drive.  He needs a little help with lower extremity  dressing; otherwise, he is an independent gentleman.   REVIEW OF SYSTEMS:  No new changes regarding review of systems.   Past medical, social, and family history are also uncharged.   MEDICATIONS:  Medications, which are prescribed by this clinic include:  1. MS Contin 50 mg b.i.d.  2. Percocet 7.5/500 t.i.d.  3. Lidoderm p.r.n.  4. Topamax 50 mg b.i.d.   He reports no new problems with these medications.  No problems with  constipation or oversedation.   PHYSICAL EXAMINATION:  VITAL SIGNS:  On exam today, blood pressure is  188/78, pulse 95, respiration 24, 92% saturation on room air.  GENERAL:  He is a morbidly obese gentleman.  He does not appear to be in  any distress.  He is oriented x3.  Speech is clear.  Affect is bright.  He is alert cooperative and pleasant.  He follows commands without  difficulty.   Cranial nerves are grossly  intact.  Transition, include sitting to  standing, is done easily.  Coordination is grossly intact.  Tandem gait  and Roberg test are performed adequately.  Seated reflexes are  diminished in the lower extremities.  Strength is 5/5 in the lower  extremities.  Sensation is intact.   Palpation along the right medial joint line of the right knee reveals  tenderness.  No effusion is appreciated.  He does have bilateral edema  and is wearing compression hose at this time as well.   IMPRESSION:  1. Chronic low back pain with known L5-S1 femoral narrowing, secondary      to degenerative disk disease.  2. History of bilateral knee osteoarthritis, right worse than left.  3. Bilateral lymphedema, secondary to obesity currently using support      stockings.   MEDICAL PROBLEMS:  Diabetes, hypertension, history of CVA in the 1990s,  history of glaucoma, kidney stone, sleep apnea, hypercholesterolemia,  GOUT, and recent right cataract surgery.   PLAN:  Pill counts are appropriate, and we will refill his medications  today.  Percocet 7.5/500 mg 1 p.o., t.i.d. p.r.n. back or knee pain,  #90, no refills, and MS Contin 15 mg 1 p.o. every 12 hours, #60, no  refills as well.   I  have continued to encourage him to participate in the pool program.  He seems to be enjoying this and finds that he actually feels much  better and has less pain when he does maintain his activity.   He was given another activity log to fill out for the next visit.  He  will see a nurse next month, and I will see him back in 2 months.  He  has been stable on the above medications.  He does not exhibit any  aberrant behavior.  He reports overall improvement with use of the  medications and is able to maintain a relatively active lifestyle  despite some physical impairments.           ______________________________  Brantley Stage, M.D.     DMK/MedQ  D:  12/20/2007 13:50:11  T:  12/21/2007 11:13:08  Job #:   [id number]   cc:   Gregary Signs A. Everardo All, MD  [address]  [place]  [place] [address]

## 2010-10-25 NOTE — Assessment & Plan Note (Signed)
The patient is a 65 year old married gentleman who has been followed in  our pain and rehabilitation clinic predominantly for low back pain and  right knee pain.  He also has dyspnea with exertion and weighs 326  pounds.   He has been stable on current medications prescribed by this clinic.  He  recently underwent a right sacroiliac joint injection performed by  Erick Colace, M.D. on Oct 22, 2006, and reports good relief with  it.  His average pain prior to the injection was a 6 on a scale of 10.  Today he is down to 3-4 on a scale of 10.   His sleep overall is fairly good.  He gets fair to good relief with the  medicines he is on.  His pain continues to be localized in the low back  region and also right knee.   The pain is worse with activities particularly walking and prolonged  standing.  Pain is described as occasionally stabbing or aching and  intermittent in nature.  He can walk between 12 to 15 minutes at a time.  He is able to climb stairs and he is able to drive.  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  He is independent with feeding,  bathing, and toiletting.  He does need assistance with dressing, meal  prep, household duties, and higher level home activities.   He denies depression or anxiety, denies suicidal ideation, denies  problems controlling bowel or bladder.   No changes in his past medical history since last visit.  He continues  to be managed by Gregary Signs A. Everardo All, M.D.  He is an insulin-dependent  diabetic and has hypertension.   He also has been treated for kidney stones and is followed by Heloise Purpura, M.D.  Dr. Laverle Patter saw him on Oct 16, 2006, and sends a note  stating that the Topamax appears to outweigh any risk from a kidney  stone for Blake Summers and would recommend continuing Topamax at this  time.   MEDICATIONS:  Prescribed by this clinic.  1. MS Contin 50 mg one p.o. b.i.d.  2. Percocet 10.5/500 up to three times a day.  3.  Topamax 2 mg twice a day.  4. Lidoderm p.r.n.   PHYSICAL EXAMINATION:  VITAL SIGNS:  Blood pressure 150/75, pulse 100,  respirations 18, 95% saturated on room air.  GENERAL:  He is an obese gentleman, weight 126 pounds.  He does not  appear in any distress.  He is oriented x3.  Speech is clear.  Affect is  bright, cooperative, pleasant, follows commands without any difficulty.  Transitions from sitting to standing slowly.  His gait in the room is  wide-based and slightly antalgic.  Seated he has no pain at internal or  external rotation of the hips.  He does have discomfort over the right  knee, especially medial joint line.  No effusion is noted today.  He has  good strength in the lower extremities, 5/5 in hip flexors, knee  extensors, dorsiflexors, plantar flexors.  Reflexes are diminished  overall in the lower extremities.  No abnormal tone is noted.   IMPRESSION:  1. Bilateral knee osteoarthritis, severe right medial joint      osteoarthritis, right greater than left.  2. L5-S1 neuroforaminal narrowing secondary to degenerative disk      disease.  3. Sacroiliac joint pain.  4. History of exertional shortness of breath.  5. History of kidney stones.  PLAN:  We will refill the following medications for him today:  1. Percocet 7.5/500 one p.o. t.i.d. p.r.n. back or knee pain #90.  2. MS Contin 50 mg one p.o. b.i.d. #60.   I have written a prescription for him to see physical therapist for a  scooter for community distances.  Blake Summers was able to negotiate short  distances, less than 10 minutes without any difficulty.  However, longer  community distances he is unable to negotiate secondary to multiple  reasons including exertional dyspnea, severe right knee osteoarthritis,  and moderate left knee osteoarthritis, and stenosis-like symptoms most  likely attributed to L5-S1 neuroforaminal narrowing secondary to DDD.   He has been stable on the current medications prescribed by  this clinic.  He has displayed no aberrant behavior.  His pill counts have been  appropriate and he is able to maintain a relatively functional lifestyle  despite his impairments.  I would like to improve his ability to  negotiate community distances with the use of an electric device for  him.  We will see him back in a month.           ______________________________  Blake Summers, M.D.     DMK/MedQ  D:  11/08/2006 12:07:58  T:  11/08/2006 40:10:27  Job #:  [id number]

## 2010-10-25 NOTE — Assessment & Plan Note (Signed)
Blake Summers is a 65 year old married gentleman who is retired and is  being seen in our [facility] for chronic low back  pain and right knee pain.   He is noted to have spondylosis and arthritic changes in both knees,  worse on the right than on the left.   He also has some bilateral lymphedema secondary to obesity.   He is back in today for a refill of his pain medications.  They were  last filled on Oct 20, 2007.   He states he forgot his bottles today.  They were empty and he took his  last pills this morning, which is consistent with his dates.   Over the last month, he states he has been doing well.  He was going to  the [facility] pool program for approximately 9 days and then he had some eye  surgery and was unable to attend his pool sessions for the last two  weeks now.  He is still recovering from cataract surgery on the right  eye and he is followed by his ophthalmologist.   His average pain in his low back and his right knee is about 7 on a  scale of 10 on the average.  Today, it is about 6.  His pain is  intermittent, worse with activities, especially walking and standing;  improves with rest, medications, and injections.   He is getting fair relief with current meds provided by this clinic.   MEDICATIONS:  Medications prescribed by our clinic include MS Contin 15  mg 1 p.o. b.i.d., Percocet 7.5/500 up to 3 times a day on a p.r.n.  basis.  He uses Lidoderm p.r.n., and he is also on Topamax 50 mg twice a  day.   His functional status is as follows.  He is able to walk at least 5  minutes at a time.  He does use a scooter for community distances.  He  is able to climb stairs slowly, one step at a time.  He is also able to  drive.  He uses a cane also.   He is independent with feeding, bathing, toileting, requires some  assistance occasionally with dressing, meal prep, household duties, and  shopping.   He denies problems controlling bowel or bladder.   Denies depression,  anxiety, or suicidal ideation.   He continues to use CPAP.   PAST MEDICAL HISTORY:  Otherwise unremarkable other than the cataract  eye surgery, which was done on November 12, 2007.   SOCIAL HISTORY AND FAMILY HISTORY:  No changes.   PHYSICAL EXAMINATION:  Today, blood pressure is 174/76, pulse 104,  respirations 24, and 96% saturated on room air.  He is a morbidly obese gentleman who does not appear in any distress.  He is oriented x3.  Speech is clear.  Affect is bright.  He is alert,  cooperative, and pleasant.   Transitioning from sitting to standing is done fairly easily.  Gait is  slightly antalgic with a wide base in the exam room today.  He has  limitations in lumbar motion in all planes.  He has full range of motion  at both knees.  There is no effusion obvious.  He is stable with testing  AP and lateral ligaments in both knees.  Mild tenderness is noted along  the medial joint line.   Motor strength is good in upper and lower extremities.  Reflexes are  overall diminished.  His strength is 5/5 at his hip flexors,  knee  extensors, dorsiflexors, and plantar flexors.   IMPRESSION:  1. Chronic low back pain with known L5-S1 foraminal narrowing      secondary to degenerative disk disease.  2. History of bilateral knee osteoarthritis, right greater than left.  3. Bilateral lymphedema secondary to obesity, using support stockings.   MEDICAL PROBLEMS:  1. Diabetes.  2. Hypertension.  3. History of cerebrovascular accident with right-sided involvement in      the early 1990s.  4. History of glaucoma.  5. Kidney stones.  6. Sleep apnea.  7. Hypercholesterolemia.  8. Gout.  9. Recent right cataract surgery.   PLAN:  Refill the following medications for him:  MS Contin 15 mg 1 p.o.  q.12 h., #60, no refills; Percocet 7.5/500 one p.o. t.i.d. p.r.n. back  or knee pain, #90, no refills.   Blake Summers takes his medications as prescribed.  I have not observed   aberrant behavior with the use of these narcotics with him.  He is able  to maintain a relatively functional lifestyle despite some physical  impairment related to his back and knees.  He has been keeping an  activity log and he is given another one today.  Over the last couple of  months, he has been engaged in a pool program, which I have strongly  encouraged.  He has had to take a hiatus secondary to his recent eye  surgery; however, he is looking forward to getting back into that again  in the next few weeks.           ______________________________  Blake Summers, M.D.     DMK/MedQ  D:  11/20/2007 10:19:08  T:  11/20/2007 24:40:10  Job #:  [id number]

## 2010-10-25 NOTE — Assessment & Plan Note (Signed)
[name] is a 65 year old married gentleman who is a patient of  Dr. [name].  He was last seen by me on November 27, 2008.  Blake Summers  has a past medical history, which is significant for diabetes,  hypertension, gout, hypercholesterolemia, kidney stones, history of CVA.  He is followed in our [facility] Clinic predominantly for  low back pain and right knee pain related to his osteoarthritis.   Blake Summers retired within the last year and a half, and has decreased  his overall activity level, and has gained some weight.   He continues to stay somewhat active.  He just got back from a camping  trip where he was gone for about 4 weeks.  He did try to use the pool at  the facility where he stayed.   He does continue to use his scooter quite a bit for community distances.   With respect to his pain, he averages about between 5 and 7 on a scale  of 10.  Pain is compromised in his activity quite a bit.  His sleep is  fair.  It is worse when he walks and stands, improves when he sits.  His  swelling in his legs go down when he puts his feet up.  Medications  help, injections have helped as well.  He has fair relief with current  meds.   MOBILITY STATUS:  He does use a cane and a scooter.  He can walk about 3  minutes at a time.  He is able to climb stairs one at a time and he is  able to drive.  He is independent with self care, needs some assistance  with lower extremity dressing, getting his compression hose on.   REVIEW OF SYSTEMS:  He has no new problems with numbness, tingling, or  weakness.  Denies problems controlling bowel or bladder.  No problems  with respect to review of systems.   PAST MEDICAL, SOCIAL, FAMILY HISTORY:  Otherwise unchanged.   MEDICATIONS:  Medications which are prescribed through this clinic  include,  1. MS Contin 15 mg 1 p.o. b.i.d.  2. Percocet 7.5/500 three times a day on a p.r.n. basis.  3. Topamax 50 mg b.i.d.   PHYSICAL  EXAMINATION:  VITAL SIGNS:  Blood pressure is 172/75, pulse  106, respirations 22, 94% saturated on room air.  GENERAL:  Blake Summers is an obese gentleman who appears his stated age.  He is oriented x3.  Speech is clear.  His affect is bright.  He is  alert, cooperative, and pleasant.  Follows my commands without  difficulty and answers my questions appropriately.   Cranial nerves and coordination are intact.  His reflexes are diminished  at the patellar tendons, 2+ at the Achilles tendons.  There is no  abnormal tone, clonus, or tremors noted.  He has normal muscle bulk.  His motor strength is 5/5 on manual muscle testing at hip flexors, knee  extensors, knee flexors, dorsiflexors, plantar flexors.   Transitioning from sitting to standing is done with some difficulty due  to his size.  His gait is wide base due to his thighs.  He has short  stride length overall.  Tandem gait and Romberg test are not evaluated  today.  He has some difficulty bearing weight through the right lower  extremity due to knee pain.   He has limitations in lumbar motion in all planes without significant  pain today.  No further tenderness noted  in the popliteal fossa of  either knee today.   IMPRESSION:  1. Significant right knee osteoarthritis, also some left knee      osteoarthritis, right is worse than left with respect to pain.  2. Chronic low back pain and lumbar spondylosis with foraminal      stenosis at L5-S1.  3. History of lymphedema secondary to morbid obesity.  4. Likely sacroiliac component to low back pain as the patient      improves 60% after sacroiliac joint injection.   MEDICAL PROBLEMS:  Morbid obesity, diabetes, hypertension, history of  CVA in 1990, history of glaucoma, kidney stones, sleep apnea,  hypercholesterolemia, gout, and cataract surgery.   PLAN:  Long discussion today with Blake Summers regarding his activity  level.  I have noted a decline in his functional status over the  last  year or so.  He has generally decreased endurance.  His strength is good  on manual muscle testing, but he is unable to stay off as long.  This is  in part due to his low back and knee pain.  He is also getting some  hamstring tightness.  In the last couple of months, he has had some  hamstring strain also.   We would like to consider getting him back in physical therapy.  He  would like to think about this.  May consider having Dr. Everardo All clear  him for therapy as well at his next visit.  I will refill his  medications today, Topamax 50 mg 1 p.o. b.i.d. #60, MS Contin 15 mg  b.i.d. #60, and Percocet 7.5/500 up to 3 times a day on a p.r.n. basis  for back and knee pain #90.  His pill counts have been appropriate.  He  takes his medications as prescribed.  Does not display aberrant behavior  and reports overall good relief with the use of these medications.           ______________________________  Brantley Stage, M.D.     DMK/MedQ  D:  01/08/2009 10:10:11  T:  01/09/2009 01:27:20  Job #:  [id number]   cc:   Gregary Signs A. Everardo All, MD  [address]  [place]  [place] [address]

## 2010-10-25 NOTE — Patient Instructions (Addendum)
Check heart test.  you will be called with a day and time for an appointment. blood tests are being ordered for you today.  please call [phone] to hear each of your test results.  You will be prompted to enter the 9-digit "MRN" number that appears at the top left of this page, followed by #.  Then you will hear the message. pending the test results, please continue the same medications for now. Please make a follow-up appointment in 3 months (update: i left message on phone-tree:  Increase lunch humalog to 120 units.  You should change clomid to testim.

## 2010-10-25 NOTE — Assessment & Plan Note (Signed)
[name] is a gentleman who is followed in our [facility]itative  Clinic for complaints related to his low back and right knee.  He is a  morbidly obese gentleman.  He is also followed by Dr. Sherlean Foot for the  right knee pain and intermittently gets injections into this right knee.  [name] has been participating in a pool therapy program over the  last several months and has been doing well and feels much better about  his strength and endurance since he has started.  He is back in today  for refill of his MS Contin as well as his Percocet.  He reports his  average pain has been between 7 and 8 on a scale of 10, localized to the  low back and the right knee.  Pain is worse with activities, improves  with rest, medication, and injections.  Sleep is fair.  His pain  interferes significantly with activity and he gets between fair and good  relief with current medications.   MEDICATIONS:  Provided by this clinic include;  1. MS Contin 15 mg b.i.d.  2. Percocet 7.5/500 t.i.d.  3. Lidoderm p.r.n.  4. Topamax 50 mg b.i.d.   He indicates that there are no problems with these medications, no  problems with oversedation or constipation.   FUNCTIONAL STATUS:  He is able to walk 5 minutes at a time.  He does use  a cane or walking stick.  He is able to climb stairs, he does drive.  He  is independent with self-care, and needs some assistance with lower  extremity dressing.   REVIEW OF SYSTEMS:  Positive for occasional numbness and tingling,  trouble walking, denies bowel or bladder control problems, denies  depression, anxiety, or suicidal ideation.   Review of systems is otherwise noncontributory, negative.  Past medical,  social, or family history is negative for any new problems.   PHYSICAL EXAMINATION:  VITAL SIGNS:  Blood pressure is 161/80, pulse  106, respirations 16, and O2 sat is 95%.  GENERAL:  He is a morbidly obese gentleman who does not appear in any  distress.  He is  oriented x3. Speech is clear.  Affect is bright.  He is  alert, cooperative, and pleasant.  Follows commands without any  difficulty.  Transitioning from sitting to standing is done with ease.  Gait in the  room is wide based, slightly shorter stride length due to his thighs.  Tandem gait and Romberg's test were not tested today.  Reflexes are symmetric and intact in the lower extremities.  Motor  strength is 5/5.  Coordination is grossly intact.  Internal and external  rotation of the hips does not bother him.  He has fairly well-preserved  range of motion.  He is wearing compression hose.  He does have some mild edema in the  lower extremities below the knee.   IMPRESSION:  1. Chronic low back pain with L5-S1 foraminal narrowing secondary to      degenerative disk disease.  2. History of bilateral knee osteoarthritis, right worse than left.  3. Bilateral lymphedema secondary to obesity currently is in support      stockings.   His medical problems include the following; diabetes, hypertension,  history of CVA in 1990, history of glaucoma, kidney stones, sleep apnea,  hypercholesterolemia, gout, and recent cataract surgery.   PLAN:  We will refill his Percocet 7.5/500 one p.o. t.i.d. p.r.n. back  or knee pain #90, no refills, MS Contin 15  mg one p.o. b.i.d. #60, no  refills.   Mr. Shughart pill counts are appropriate.  He takes his medications as  prescribed.  No aberrant behavior is observed with him.  He continues to  maintain his activity level by participating in a pool therapy program,  and we will see him back in 1 month.           ______________________________  Brantley Stage, M.D.     DMK/MedQ  D:  02/12/2008 14:06:14  T:  02/13/2008 06:32:37  Job #:  [id number]   cc:   Gregary Signs A. Everardo All, MD  [address]  [place]  [place] [address]

## 2010-10-25 NOTE — Progress Notes (Signed)
Subjective:    Patient ID: Blake Summers, male    DOB: 1945/11/10, 65 y.o.   MRN: [id number]  HPI Pt states 5 mos of moderate palpitations in the chest, in the context of exertion, and assoc doe.   no cbg record, but states cbg's are highest before supper, and at hs.  and lowest in am. On clomid 50 mg qd, he feels no different. Past Medical History  Diagnosis Date  . GERD 07/06/2010  . Edema 10/28/2007  . DIABETES MELLITUS, TYPE II 05/04/2007  . HYPERLIPIDEMIA 07/08/2008  . ALLERGIC RHINITIS 07/08/2008  . GLAUCOMA 07/08/2008  . CEREBROVASCULAR ACCIDENT, HX OF 07/08/2008  . OBSTRUCTIVE SLEEP APNEA 03/06/2008  . DIABETIC ULCER, LEFT LEG 08/23/2009  . 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 HYPERTENSION 10/28/2007  . VENOUS INSUFFICIENCY 03/08/2009  . FATTY LIVER DISEASE 05/19/2008  . BENIGN PROSTATIC HYPERTROPHY 07/08/2008  . 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 NEPHROLITHIASIS, HX OF 07/08/2008  . DEGENERATIVE JOINT DISEASE 05/24/2009  . Morbid obesity   . DM nephropathy/sclerosis     No past surgical history on file.  History   Social History  . Marital Status: Married    Spouse Name: N/A    Number of Children: N/A  . Years of Education: N/A   Occupational History  . Retired     Nature conservation officer   Social History Main Topics  . Smoking status: Former Games developer  . Smokeless tobacco: Not on file  . Alcohol Use: No  . Drug Use: No  . Sexually Active:    Other Topics Concern  . Not on file   Social History Narrative   Diet is "good"Exercise is limited by medical problems    Current Outpatient Prescriptions on File Prior to Visit  Medication Sig Dispense Refill  . albuterol (PROVENTIL HFA) 108 (90 BASE) MCG/ACT inhaler Inhale 1 puff into the lungs every 4 (four) hours as needed.        Marland Kitchen allopurinol (ZYLOPRIM) 300 MG tablet TAKE ONE (1) TABLET BY MOUTH EVERY      DAY  30 tablet  5  . clobetasol (TEMOVATE) 0.05 % ointment Apply topically 3 (three) times daily as needed. For  itching       . clomiPHENE (CLOMID) 50 MG tablet Take 50 mg by mouth daily.        Marland Kitchen diltiazem (CARDIZEM CD) 180 MG 24 hr capsule TAKE 2 CAPSULES (360MG ) BY MOUTH DAILY  60 capsule  8  . dipyridamole-aspirin (AGGRENOX) 25-200 MG per 12 hr capsule Take 1 capsule by mouth 2 (two) times daily.        . furosemide (LASIX) 80 MG tablet Take 80 mg by mouth daily.        Marland Kitchen glucose blood (ONE TOUCH TEST STRIPS) test strip Check blood sugar qid       . HUMULIN N PEN 100 UNIT/ML injection INJECT 120 UNITS AT BEDTIME  45 mL  3  . insulin lispro (HUMALOG KWIKPEN) 100 UNIT/ML injection (qac) 80-120-140-20 units      . Insulin Pen Needle (NOVOFINE) 30G X 8 MM MISC Use four times a day dx 250.00  100 each  5  . KLOR-CON M20 20 MEQ tablet TAKE ONE (1) TABLET BY MOUTH TWO (2)    TIMES DAILY  60 tablet  8  . lidocaine (LIDODERM) 5 % Place 1 patch onto the skin daily. Remove & Discard patch within 12 hours or as directed by MD       . loratadine-pseudoephedrine (CLARITIN-D  12-HOUR) 5-120 MG per tablet Take 1 tablet by mouth 2 (two) times daily.        Marland Kitchen losartan-hydrochlorothiazide (HYZAAR) 100-25 MG per tablet TAKE ONE (1) TABLET BY MOUTH EVERY DAY  FOR BLOOD PRESSURE  30 tablet  8  . lovastatin (MEVACOR) 40 MG tablet Take 2 tablets by mouth at bedtime       . metFORMIN (GLUCOPHAGE-XR) 500 MG 24 hr tablet Take 2 tablets by mouth twice a day       . methocarbamol (ROBAXIN) 500 MG tablet 1 tablet at bedtime as needed for cramps       . morphine (MS CONTIN) 15 MG 12 hr tablet Take 15 mg by mouth 2 (two) times daily.        . naproxen sodium (ANAPROX) 220 MG tablet 2 tablets by mouth once daily as needed for pain       . oxyCODONE-acetaminophen (PERCOCET) 7.5-500 MG per tablet 1 tablet by mouth three times a day as needed       . polyethylene glycol powder (GLYCOLAX/MIRALAX) powder MIX AND TAKE 17 GRAMS DAILY AS DIRECTED  527 g  5  . ranitidine (ZANTAC) 150 MG tablet 1 tablet two times a day as needed for heartburn        . topiramate (TOPAMAX) 50 MG tablet Take 50 mg by mouth 2 (two) times daily.        Marland Kitchen zolpidem (AMBIEN) 10 MG tablet Take 10 mg by mouth at bedtime as needed.          Allergies  Allergen Reactions  . Neosporin (Neomycin-Polymyx-Gramicid)     Family History  Problem Relation Age of Onset  . Cancer Brother     Colon Cancer  . Heart disease Brother   . Heart disease Brother     BP 128/82  Pulse 100  Temp(Src) 98.7 F (37.1 C) (Oral)  Ht 5\' 4"  (1.626 m)  Wt 369 lb (167.377 kg)  BMI 63.34 kg/m2  SpO2 92%    Review of Systems Denies loc.  denies hypoglycemia    Objective:   Physical Exam GENERAL: no distress.  Obese.  LUNGS:  Clear to auscultation HEART: Regular rate and rhythm without murmurs noted. Normal S1,S2.      Lab Results  Component Value Date   HGBA1C 8.0* 10/25/2010   Lab Results  Component Value Date   TESTOSTERONE 128.06* 10/25/2010      Assessment & Plan:  Dm, needs increased rx Palpitations, uncertain etiology Hypogonadism, needs increased rx

## 2010-10-25 NOTE — Assessment & Plan Note (Signed)
Blake Summers is a pleasant 65 year old gentleman who is being followed in  our [facility] for low back pain and right knee  pain.  He also sees Dr. Lequita Halt for the right knee and has been getting  intact corticosteroid injections.   [name] is back in for refill of his medications.  He reports no new  problems.  He did get a scooter evaluation in July.   His average pain in the low back and the right knee is about a 7 on a  scale of 10.  He is getting good relief with the medicines that he is  prescribed.  Pain is described as constant, stabbing and sharp  especially when he is up and walking, improves with rest, medication and  injections.   Medications prescribed by this Clinic include:  1. MS Contin 15 mg 1 p.o. b.i.d.  2. Oxycodone 7.5/500 t.i.d.  3. Topamax 50 mg 1 p.o. b.i.d.  This prescription will be due in      November.  4. Lidoderm on a p.r.n. basis.   He is able to walk about 5 minutes at a time.  He is able to climb  stairs and drive.  He is working 40 hours a week and he needs some  assistance with lower extremity dressing, some household duties,  shopping and meal prep.  Denies suicidal ideation, depression or  anxiety.  Admits to some intermittent numbness and tingling, trouble  walking.   No changes in past medical, social or family history since last visit.   EXAM:  VITAL SIGNS:  Blood pressure is 131/56, pulse 108, respirations  18, 94% saturated on room air.  GENERAL:  He is morbidly obese gentleman who does not appear in any  distress.  He is oriented x3.  Speech is clear.  Affect is bright,  alert, cooperative and pleasant.  He follows commands without  difficulty.   Transitioning from sit to stand a little bit difficult getting out of  the chair.  Gait in the room is initially antalgic as he walks a little  bit his gait becomes less antalgic.  It is obvious his right lower  extremity does bother him as he walks in the room.   He  has limitations in lumbar motion.  He has tenderness along the joint  line of the right knee.  Motor strength, reflexes and sensation are  intact.  He does have some support hose on.   IMPRESSION:  1. Bilateral knee osteoarthritis, worse on the right than on the left.  2. L5-S1 neuroforamen narrowing secondary to degenerative disc      disease.  3. Sacroiliac joint pain.  4. History of external shortness of breath.  5. History of kidney stones.  6. Lymphedema, using support hose.   PLAN:  Will refill the following medications for him.  Oxycodone 7.5/500  one p.o. t.i.d., No. 90, MS Contin 15 mg 1 p.o. b.i.d., No. 60, no  refills, and will give him a prescription for support hose for  lymphedema.   Call over to [facility] at [facility], [phone].  Discussed case with Rosalita Chessman, physical therapist at that facility.  She  states that she did write a letter stating that [name] would benefit  from using a scooter in his workplace.  Apparently, [facility] was  contacted in [place] regarding this.  She has asked that [name]  make phone contact with her to discuss the details of this.  Will see  [name] back in 2 months, a nursing visit next month for refill of  medications.   He has been stable on these medications.  He does not exhibit any  aberrant behavior.  He uses his medications as appropriate and he is  able to maintain a relatively functional lifestyle, working 40 hours a  week, despite some physical impairments that are quite painful for him.           ______________________________  Brantley Stage, M.D.     DMK/MedQ  D:  02/06/2007 14:11:45  T:  02/07/2007 09:03:21  Job #:  [id number]

## 2010-10-25 NOTE — Consult Note (Signed)
VASCULAR SURGERY CONSULTATION   Moncrief, Ell L  DOB:  1945/12/27                                       04/22/2009  CHART#:[id number]   I saw the patient in the office today concerning his chronic venous  insufficiency.  This is a pleasant 65 year old gentleman who states that  approximately 6-8 months ago he developed a gradual onset of swelling in  his left leg which has gradually progressed.  He is unaware of any  previous history of DVT or phlebitis.  He has had no history of  lymphedema that he is aware of.  He has had no abdominal surgery, groin  surgery or radiation therapy.  His symptoms of swelling have been  persistent.  They are aggravated by standing and sitting and relieved  somewhat with elevation.  He has tried compression stockings but these  have been difficult to get on.  He had a venous Doppler study in April  of this year which showed no evidence of DVT in the left lower extremity  and no obvious evidence of chronic venous insufficiency.  I have  reviewed the study and also reviewed the study from 04/16/2009 which  again showed no evidence of DVT in the left lower extremity or obvious  incompetence.  He was sent for vascular consultation.   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  This has been stable on his current medications.  In  addition, he has a history of hypertension and hypercholesterolemia,  both of which have been stable on his current medications.  In addition,  he has a history of obesity.  He denies any history of previous  myocardial infarction, history of congestive heart failure or history of  COPD.   FAMILY HISTORY:  All of his family members have had heart disease at a  young age.  He states both his mother and father had heart disease in  their 65s.  He also has had multiple siblings with heart disease.   SOCIAL HISTORY:  He is married, he has 1 child.  He quit tobacco  30  years ago.   MEDICATIONS:  Are documented on the medical history form in his chart.   REVIEW OF SYSTEMS:  CARDIAC:  He has occasional chest pain which has  been stable.  He also admits to orthopnea and dyspnea on exertion.  In  addition, he has some pain in his feet when lying flat and some mild  pain in his legs with ambulation.  ORTHO:  He has arthritis and also has some joint pain and muscle pain.  General, pulmonary, GI, GU, neuro, psychiatric, ENT, hematologic and  integument review of systems is unremarkable and is as documented on the  medical history form in his chart.   PHYSICAL EXAMINATION:  General:  This is a pleasant 65 year old  gentleman who appears his stated age.  He is morbidly obese and weighs  355 pounds.  Vital signs:  His blood pressure is 140/79, heart rate is  96, respiratory rate is 20.  HEENT:  Extraocular motions are intact.  Conjunctivae are normal.  There is no facial asymmetry.  Neck:  Is  supple.  There is no JVD.  There is no cervical lymphadenopathy.  Lungs:  Are clear bilaterally to auscultation without rales, rhonchi or  wheezing.  Cardiovascular:  I do not detect any carotid bruits.  He has  a regular rate and rhythm without murmur or gallop appreciated.  He has  peripheral edema bilaterally but 3+ peripheral edema on the left with  significant left lower extremity swelling.  He has palpable radial  pulses bilaterally and palpable dorsalis pedis pulses bilaterally.  Musculoskeletal:  There are no major deformities or cyanosis.  Neurological:  There is no focal weakness or paresthesias.  Skin:  He  has significant hyperpigmentation bilaterally in both lower extremities  but more significantly on the left side with some superficial  ulcerations circumferentially on the left leg and mild cellulitis.   I have reviewed both of his previous Doppler studies which showed no  obvious venous insufficiency and no DVT.  I have reviewed his lab work  from  [facility] primary care office which showed a creatinine of 1.0.  Potassium 3.6.   Based on his exam I think he has evidence of chronic venous  insufficiency on the left.  I think given his size it is probably  difficult to see this on duplex so despite the duplex findings where no  obvious chronic venous insufficiency was noted clearly with  hyperpigmentation, swelling and venous ulcerations I think this is the  underlying problem.  He has no good reason to have lymphedema although  he may have some mild idiopathic lymphedema on the left.  Regardless, I  have explained the symptoms are quite simply elevation and compression  therapy.  His current compression stockings is a 30-40 mmHg gradient and  he has a very difficult time getting these on.  For this reason I have  written him a prescription for a knee high compression stocking with a  20-30 mmHg pressure gradient.  I have also discussed with him the  importance of leg elevation and the proper position for this.  Finally I  have instructed him to keep his skin well lubricated to prevent cracks  and ulcers especially in the winter.  He understands this will be a  chronic problem.  I have offered to see him back in 1 year for continued  followup.  He knows to call sooner if he has any problems.  I did  reassure him that he has excellent arterial flow and this is certainly  not compromising healing of his leg in any way.   Di Kindle. Edilia Bo, M.D.  Electronically Signed  CSD/MEDQ  D:  04/22/2009  T:  04/23/2009  Job:  [id number]   cc:   Gregary Signs A. Everardo All, MD

## 2010-10-25 NOTE — Assessment & Plan Note (Signed)
[name] is a 65 year old married gentleman who is the patient of Dr.  Romero Summers.  Blake Summers is being followed in our [facility] for chronic pain complaints related to low back  pain and predominantly right knee pain.  He is known to have  osteoarthritis as well as lumbar spondylosis.   He is also followed by Dr. Lequita Summers for his right knee pain and he  obtained periodic injections in the right knee intermittently.   Blake Summers was last seen on March 11, 2008.  In the interim, he has  had no medical problems.  He has had a check up with Dr. Everardo Summers who  reviewed his medications with him.   Blake Summers states that he has been active over the last month.  He  continues to participate in a [employer] pool program.  Last week, he was  quite busy helping his wife and his daughter's shop, in fact one night  he was up Summers night driving them to various pool locations.   His average pain is unchanged about 7 on a scale of 10, predominately  located in the right low back region and in the right knee.  Pain  moderately interferes with his activity level, worse with walking and  standing, improves with rest, medication, and injections.  He also finds  the Lidoderm patches helpful.   Pain is described as intermittent, daytime is the worst time for him  with respect to pain.   He gets fair to good relief with current medications prescribed through  the clinic.   MEDICATIONS:  From this clinic include,  1. Topamax 50 mg 1 p.o. b.i.d.  2. MS Contin 15 mg 1 p.o. q.12 h.  3. Percocet 7.5/500 3 times a day on a p.r.n. basis.   Pill counts are appropriate.   MOBILITY:  Blake Summers can walk about 5 minutes at a time.  He is able to  climb stair 1 step at a time, for community distances he uses a scooter  and occasionally will use a cane.  He is independent with his self-  cares, occasionally need some assistance with lower extremity dressing.   REVIEW OF SYSTEMS:   Otherwise, noncontributory.   Past medical, social, family history are unchanged from previous visit.   PHYSICAL EXAMINATION:  VITAL SIGNS:  Blood pressure 153/74, pulse 110,  respirations 20, 95% saturated on room air.  GENERAL:  Blake Summers is a well-developed, morbidly obese gentleman who  appears as stated age and do not appear in any distress.  He is oriented  x3.  Speech is clear.  His affect is bright.  He is alert, cooperative,  and pleasant.  He follows commands without any discomfort.  NEURO:  Cranial nerves II to X are grossly intact.  Coordination is  intact.  No changes in reflexes or sensation.  His motor strength is 5/5  in lower extremities.  MUSCULOSKELETAL:  Right knee with evidence of mild effusion tenderness,  lateral joint line more so than medial joint line today.  He does have  crepitus with flexion, extension, medial, lateral, and AP testing did  not reveal any evidence of instability.  No tenderness in the popliteal  fossa.  He does have a 10-12 degree flexion contracture of his right  knee today.  He is unable to extend it in sitting or in standing, which  is new for him.   No pain with internal, external rotation of either hip are noted.  IMPRESSION:  1. History of bilateral knee osteoarthritis right worse than left.      Today, there is evidence of effusion and flexion contracture on the      right 10-12 degrees with antalgic gait.  2. Chronic low back pain L5-S1 foraminal narrowing secondary to      degenerative disk disease.  3. 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   MEDICAL PROBLEMS:  Diabetes, hypertension, history of cerebrovascular  accident in 1990, history of glaucoma, kidney stones, sleep apnea,  hypercholesterolemia, gout, and cataract surgery.   PLAN:  1. We will refill Percocet 7.5/500 one p.o. t.i.d. #90.  2. MS Contin 50 mg one p.o. q.12 h. #60, no refills are needed.   Blake Summers is not  experiencing any side effects from these medications.  No problems with oversedation or constipation.  He continues to maintain  a relatively active lifestyle despite significant orthopedic impairment.  He continues to go to the [facility] on weekly basis.  His pill counts are  appropriate.  He does not display any aberrant behavior with the use of  his pain medication.  Pain is fairly well controlled.   Recommend he follow back up with Dr. Lequita Summers for further evaluation of  the right knee.  We will see him in 1 month.  His modified Oswestry is  58%, which is severe disability.           ______________________________  Blake Summers, M.D.     DMK/MedQ  D:  05/11/2008 09:39:33  T:  05/11/2008 22:57:28  Job #:  [id number]

## 2010-10-25 NOTE — Procedures (Signed)
Blake Summers, Blake Summers               ACCOUNT NO.:  [id number]   MEDICAL RECORD NO.:  [id number]          PATIENT TYPE:  REC   LOCATION:  [facility]                          FACILITY:  [facility]   PHYSICIAN:  Erick Colace, M.D.DATE OF BIRTH:  1945-08-12   DATE OF PROCEDURE:  10/22/2006  DATE OF DISCHARGE:                               OPERATIVE REPORT   PROCEDURE:  Right sacroiliac injection under fluoroscopic guidance.   INDICATIONS:  Right buttock pain with moderately severe pain rated a  6/10.   This pain has only been partially responsive to narcotic analgesic  medications.   Informed consent was obtained after describing risks and benefits of the  procedure to the patient.  These include bleeding, bruising, infection,  right lower extremity paralysis.  He elects to proceed and has given  written consent.  The patient placed prone on fluoroscopy table with  Betadine prep, sterile drape.  A 25 gauge 1-1/2 inch needle was used to  anesthetize skin and subcu tissue, 1% lidocaine x2 mL. Then a 22 gauge 7  inch spinal needle was inserted under fluoroscopic guidance into the  right sacroiliac joint.  AP and lateral imaging utilized.  Omnipaque 180  x0.5 mL demonstrated no intravascular uptake and a solution containing  of 0.5 mL of 40 mg/mL Depo-Medrol and 1 mL of 2% MPF lidocaine was  injected.  The patient tolerated the procedure well.  Pre- and post  injection vitals stable.  The patient will follow up with Dr.  Pamelia Hoit.  Preinjection pain level 6/10, post injection 6 but he feels  like it is starting to go down.  No follow-up and track his pain over  time.      Erick Colace, M.D.  Electronically Signed     AEK/MEDQ  D:  10/22/2006 14:38:59  T:  10/22/2006 15:32:18  Job:  [id number]

## 2010-10-28 NOTE — Assessment & Plan Note (Signed)
MEDICAL RECORD NUMBER:  [id number].   REFERRAL SOURCE:  Sean A. Everardo All, M.D.   [name] is a 65 year old white gentleman accompanied by his wife for his  second visit here at the pain and rehabilitative clinic.  He was last seen  on February 10, 2004.   He has a history of severe low back pain which limits his being up standing  and walking for any length of time.  He really does not have any radicular  pain into the legs.  Mainly pain stays in the low back area and is  particularly worse with standing.  He is not worse with forward flexion.  He  has been taking two Aleve in the morning.  He also takes MS Contin 15 mg  twice a day, as well as approximately three 7.5 mg Percocet per day for pain  relief.   With these medications he stays quite functional.  He is independent with  his ADLs, although he is taking somewhat longer to perform them, notes his  wife today.  He is able to be up walking about 10 minutes.  He is  independent with helping out with domestic duties, such as dishes, cleaning  and laundry.  He is able to drive and he works full time as a Production designer, theatre/television/film  with [employer].   Overall, sleep has been fair.   Denies harm to self or others.  Denies suicidal ideation.   CURRENT MEDICATIONS:  Mr. Biermann brings in a list of his medicines,  including the following:  1.  Allegra.  2.  Ticlopidine 250 mg b.i.d.  3.  Alphagan one drop to each eye twice a day.  4.  Gabapentin 800 mg three times a day.  5.  diltiazem ER 300 mg daily.  6.  Endocet 7.5/500 mg three times a day.  7.  Novolog insulin 55 units in the morning, 85 units at lunch and 100 units      at dinner.  8.  Novolin insulin 110 units at dinner.  9.  Metformin 1000 mg two tablets twice a day.  10. Lovastatin 40 mg two q.h.s.  11. Lisinopril 40 mg one daily.  12. Furosemide 80 mg one daily.  13. Glyset 50 mg one three times a day.  14. Xalatan 2.5 in each eye at night for glaucoma.  15. Allopurinol 300 mg  daily.  16. Lovastatin 40 mg two tablets daily.   PAST MEDICAL HISTORY:  Remarkable for:  1.  Use of CPAP.  2.  History of CVA 10 years ago.  3.  Diabetes x 12 years.  4.  Basal cell cancer of his nose.  5.  History of hypertension.  6.  Gastrointestinal reflux.  7.  Glaucoma.   PHYSICAL EXAMINATION:  On exam today, this is an obese white gentleman in no  apparent distress.  Talkative.  He is oriented and overall in good spirits.  He reports his overall pain is about an average of a 6 on a scale of 10  located in the middle of the low back.   The blood pressure is 116/65, pulse 95, respirations 20 and 95% saturated on  room air.  He is able to get out of the chair easily.  He was comfortable  during the interview for most of our interview.  He walks in the room.  He  has a rather wide-based gait due to obesity.  Forward flexion and extension  do not increase his pain at the moment.  He does report if he has been up  for a while his low back will continue to hurt him.  Seated reflexes are 2+  at the biceps, triceps and brachial radialis and 2+ at the knees and ankles.  Toes are downgoing.  No clonus is noted.  He has normal tone throughout the  upper and lower extremities.  He is wearing compression stockings.  Edema is  not appreciated.   Motor strength in the upper extremities is 5/5 at the shoulder abductors,  biceps, triceps, brachial radialis, wrist extensors, finger flexors and  intrinsics and 5/5 at hip flexors, knee extensors, dorsiflexors, plantar  flexors and EHL.  Sensation is intact with light touch overall.  Romberg's  test is negative.   IMPRESSION:  1.  Lumbago.  The patient's last MRI scan was done in 2002 and at that time      showed L5-S1 disk degeneration with broad base protrusion slightly      displacing the left S1 nerve root posteriorly and also some L5-S1 neural      foraminal narrowing, left greater than right with diffuse bulges at L3-      4, as well  as L4-5.  2.  Lumbar spondylosis.  3.  Right knee osteoarthritis.  4.  CPAP at night.  5.  Diabetes.  6.  Positive family history for heart disease.   PLAN:  The patient does not need refills on his morphine sulfate today.  Will refill his Percocet, however, last filled on February 23, 2004.  He  takes three a day typically.  Will refill his Percocet 7.5/500 mg one p.o.  t.i.d., #90.  Will start to decrease his Neurontin.  He is interested in  maybe starting the Topamax.  He really does not have a significant amount of  leg pain at this point.  We will see how he does as we taper it.  He was on  Neurontin 800 mg q.8h.  Will switch him to Neurontin 600 mg q.8h.  Will also  refill Lidoderm patches 5% 12 hours and 12 off, #90.  He was also set up for  a physical therapy program to work on strengthening and condition with  cardiac precautions with transition to a pool program.   Also discussed today issues of tolerance and dependency with opioids.  He  has an understanding of this now.  May consider TENS unit for him in the  future.  May consider facet block.  We discussed this as well today.  Will  see him back in one month.       DMK/MedQ  D:  03/24/2004 12:42:55  T:  03/24/2004 17:03:12  Job #:  [id number]

## 2010-10-28 NOTE — Assessment & Plan Note (Signed)
Blake Summers is a 65 year old married gentleman who has been seen in our  pain an rehabilitative clinic, predominantly for low back pain and right  knee pain.   He was last seen by me on 07/06/2006.  He is back in today for a refill  of his medications.  He had a kidney stone several months ago.  There is  a question whether or not it is attributed to his use of the Topamax.  We did taper him down and off of the Topamax, however, Blake Summers states  that once he was off the Topamax he had a worsening of his back pain.  When he was taking Topamax his maximum pain was about a 6-7 on a scale  of 10.  Without the Topamax it was about an 8 or a 9.  With Topamax he  can walk about 10 minutes.  Without the Topamax he can walk about 3  minutes.   His average pain today is about a 6 on a scale of 10.  Sleep is fair.  He gets fair to good relief of his pain with current meds that he is on.  He takes morphine 15 mg twice a day, Oxycodone 7.5 up to 3 times a day,  Topamax 50 mg twice a day, and Lidoderm up to 3 patches, 12 hours on, 12  hours off.   Currently he is walking about 10 minutes at a time, is able to climb  stairs, driving, working full time.  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   REVIEW OF SYSTEMS:  Otherwise negative.   PAST MEDICAL HISTORY:  No changes.   SOCIAL HISTORY:   FAMILY HISTORY:  No changes since his last visit.   EXAM TODAY:  Blood pressure is 152/71, pulse 91, respirations 18, 94%  saturated on room air.  He is a well-developed, morbidly obese gentleman  who appears his stated age.  He is oriented x3.  His affect is bright,  alert, cooperative, pleasant.  Speech clear, follows commands without  problems.   Transitions from sit to stand easily, gait in the room is slightly  antalgic, decreased weightbearing through the right lower  extremity.   Limitations in lumbar motion in all planes are appreciated as well.  Reflexes are diminished in both lower extremities.  Motor strength,  however, is excellent, 5/5 at hip flexors and extensors, dorsiflexors,  plantar flexors.   Straight-leg raise is negative.   Tenderness is noted over the right knee along the medial joint line.  No  effusion is appreciated.  No instability is appreciated.   IMPRESSION:  1. Bilateral knee osteoarthritis, right greater than left.  2. Lumbar stenosis.  3. Lumbar spondylosis.  4. History of exertional shortness of breath.  5. History of enlarged heart.  6. Recent kidney stone.   PLAN:  Blake Summers is interested in restarting Topamax.  He understands  risks and benefits of this medication, including glaucoma as well as  kidney stones.  He feels he is much more functional while taking  medication.  We have attempted to taper him down and off the medication.  However, he states his function was significantly worse off the  medication.  We talked about alternative medication, including Neurontin  and Lyrica.  Neurontin in the past has made him too sleepy.  He is not  interested in this.  Also, both are  associated with a weight gain,  whereas Topamax is associated, for the most part, with an average weight  loss of approximately 7 pounds.   He understands the risks of Topamax, would like to pursue reinstating  this medication in his current regimen.   Incidentally, Blake Summers states he has been taking some over-the-counter  Aleve.  He began this in early September.  We were unaware he started  taking this.  Incidentally, blood pressures over that period of time  went up slightly, mostly in the systolic range.  Today he is 152/71.  I  discussed risks and benefits of using an anti-inflammatory.  He feels he  has done quite well on the Aleve.  I asked him to discuss this further  with Dr. Cleophas Dunker. Everardo All, who he will be having an appointment  with  tomorrow.  We will fill the following medications for him:  Topamax 50  mg 1 p.o. b.i.d. #60, MS Contin 15 mg 1 p.o. b.i.d. #60, oxycodone  7.5/500, 1 p.o. t.i.d. #90.  We will see him back in a month.           ______________________________  Brantley Stage, M.D.     DMK/MedQ  D:  08/20/2006 11:03:51  T:  08/20/2006 11:38:57  Job #:  [id number]

## 2010-10-28 NOTE — Assessment & Plan Note (Signed)
MEDICAL RECORD NUMBER:  [id number].   Blake Summers is a 65 year old gentleman who is referred by Dr. Everardo All. He was  last seen by nursing staff on Oct 12, 2004 and by me on September 16, 2004.   He has not changes in his pain, still located in the low back and right  knee. Worse with activity, bending, and flexing. Average pain is about a 7  on a scale of 10. Pain described as aching and stabbing. Worse in the day  time when he is active and improves with rest and medications. Fair to good  relief with medications. He can walk about 10 minutes at a time. He is able  to climb stairs and drive. He is can walk with assistance. Sometimes he uses  a cane. He works 40 hours a week.   Neuropsych review positive for numbness, tingling, trouble walking, and no  new changes in other review of systems.   No changes in past medical, social, or family history.   PHYSICAL EXAMINATION:  Blood pressure 130/66, pulse 99, respirations 16, 96%  saturated on room air. He is a well-developed, rather obese gentleman who  does not appear in any distress. He is oriented x3. His affect is overall  bright. He is alert. He is cooperative and pleasant. He is able to stand  after being seated. His gait is wide based because of his girth. He has some  antalgia on the right because of the right knee. Seated, reflexes are 0 to  1+ at the knees and ankles. He has excellent strength at the hip flexors,  knee extensors, dorsi flexors, plantar flexors, EHL. No new sensory  deficits. He has some right medical joint line tenderness on the right knee.   IMPRESSION:  1.  Osteoarthritis of the right knee.  2.  Lumbago/lumbar spondylosis.  3.  Morbid obesity.  4.  Diabetes.  5.  Sleep apnea.  6.  Shortness of breath with exertion.   Will go ahead and refill Blake Summers medications today. He is going to be  going on vacation over the Fourth of July weekend. We will give him a 21-day  supply, and he will return to clinic on  approximately June 21 for a month's  supply of these medications as well. We will give him Lidoderm 5% 1 to 3  patches 12 hours on and 12 hours off #90. We will give him MS Contin 15 mg 1  p.o. b.i.d. #42. Endocet 7.5/500 one p.o. t.i.d. #63 and Topamax 25 mg 1  p.o. t.i.d. #63.   When he comes in prior to his vacation, we will give him a month's supply of  the above. He has been stable. No aberrant behavior. He takes his narcotics  appropriately. We will see him back then for refill of his medications near  the end of the month, and then I will follow up with him one month after  that.      DMK/MedQ  D:  11/11/2004 12:40:35  T:  11/12/2004 13:58:52  Job #:  [id number]

## 2010-10-28 NOTE — Assessment & Plan Note (Signed)
Blake Summers is a 65 year old married gentleman who is being seen in our  pain and rehabilitative clinic for chronic low back pain secondary to  lumbar spondylosis as well as right knee pain secondary to  osteoarthritis.  He is also a morbidly obese gentleman.   He was last seen by me on February 16, 2006, and he had a nursing visit  in the interim.  Today he is back in and reports continued low back pain  as well as worsening right knee pain.   For the right knee, he has been getting intermittent knee injections per  Dr. Lequita Halt, his last one approximately greater than 4 months ago now.   His average pain is about a 6 on a scale to 10.  He sleeps fairly well.  He does use CPAP at night.  He reports increased pain with walking,  crawling, standing, and improves with rest, heat, medications.  He  reports good relief with the current medications he is using now.   He has limitations in ability to ambulate long distance.  He is able to  climb stairs and drive.  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  He requires some assistance with dressing but is independent  with the rest of his self care.  Does need some help with high-level  household duties.   He admits to intermittent numbness, tingling, trouble walking, and  dizziness.   REVIEW OF SYSTEMS:  Positive for recent kidney stones.  Dr. Laverle Patter took  care of him for this.   SOCIAL HISTORY AND FAMILY HISTORY:  Otherwise unchanged.   PHYSICAL EXAMINATION:  VITAL SIGNS: Blood pressure 155/64, pulse 101,  respirations 16, 96% saturation on room air.  GENERAL:  Well-developed, morbidly obese gentleman who appears his  stated age.  He is oriented x3.  He does not appear in any distress.  His affect is bright, alert.  He is cooperative, pleasant.  Speech is  clear.  He follows commands without any difficulties.   He transitions from sit to stand with a little bit of trouble getting  up.  He does appear quite stiff.  His  gait is obviously antalgic.  Decreased weightbearing in the right lower extremity.  His lumbar motion  is limited and painful for him.  Seated, his reflexes are diminished at  the knees and at the patella tendons.  Motor strength is good, however,  in both lower extremities, and no focal neurological deficits are  appreciated today.  He does have tenderness around the right medial  joint line.  I do not appreciate an effusion of the right knee today,  however.   IMPRESSION:  1. Bilateral knee osteoarthritis, right worse than left.  2. Lumbar stenosis.  3. Lumbar spondylosis.  4. History of exertional shortness of breath.  5. History of enlarged heart.  6. Recent kidney stone.   PLAN:  Will discuss kidney stone further with Dr. Laverle Patter and may  consider discontinuing Topamax for Mr.  Summers.   Will refill the following medications for him today.  1. MS Contin 15 mg 1 p.o. b.i.d., #60.  2. __________7.5/500 one p.o. 3 times a day, #90.   We will have nursing staff refill his medications in the next 4 weeks,  and I will see him back after the holidays.  He has been stable on these  medications, displays no aberrant behavior, and gets fairly decent  relief.  He will also make an appointment with Dr.  Aluisio for further  knee evaluation, possible injection.           ______________________________  Brantley Stage, M.D.     DMK/MedQ  D:  05/11/2006 14:06:34  T:  05/12/2006 17:13:46  Job #:  [id number]

## 2010-10-28 NOTE — Assessment & Plan Note (Signed)
INTERVAL HISTORY:  Mr. Lemming is a 65 year old married gentleman who is  accompanied by his wife.  He is being seen in our pain and rehabilitative  clinic for chronic low back pain and right knee pain.   He states his knee pain is getting worse.  He saw Dr. Lequita Halt back in  September or October.  He had an injection at that time and had dramatic  improvement but he feels the injection has worn off after 5 or 6 months.   Mr. Whorley also has undergone multiple spinal injections.  He has had an  epidural injection which he states did not help that much that was back in  December.  Subsequent to that in January and February he has had sacroiliac  joint injections which he feels has helped him somewhat.  His pain is down  to a 6 on a scale of 10.   He reports fair sleep.  Pain is still localized to the low back and the  right knee described as aching, intermittently stabbing in nature, improves  with rest, heat, medications and injections, worse with walking, prolonged  standing.   He reports good relief with the current meds he is on.   MEDICATIONS:  Medications at this time include MS Contin 15 mg one p.o.  b.i.d., Endocet 7.5/500 one p.o. t.i.d., Topamax 75 mg t.i.d. and Lidoderm  p.r.n.   FUNCTIONAL STATUS:  The patient is able to walk 15 minutes at a time, he is  able to climb stairs one at a time, and he is able to drive.  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   REVIEW OF SYSTEMS:  Review of systems on the health and history form are  noted.   PAST MEDICAL/SOCIAL/FAMILY HISTORY:  Unchanged since last visit.   EXAMINATION:  Blood pressure is elevated at 183/87.  I asked him to follow  up with Dr. Everardo All regarding this elevated pressure.  Pulse 93,  respirations 18, 96% saturated on room air.   Mr. Lewallen is an obese gentleman who  appears his stated age.  He is oriented  x3.  His affect is bright, alert, cooperative, and pleasant.  He is not  short of breath.  He is able to stand up after being seated.  His gait in  the room is a bit wide based with a short stride length.  Seated reflexes  are diminished at the patella tendons and Achilles tendons.  Motor strength  is good in the lower extremities.  Straight leg raise is negative.  No new  sensory deficits.   IMPRESSION:  1.  Lumbago secondary to lumbar spondylosis.  2.  Spinal stenosis biforaminal narrowing L4-5, L5-S1.  3.  Morbid obesity.  4.  Diabetes.  5.  Sleep apnea.  6.  Right knee osteoarthritis.  7.  Shortness of breath with exertion.   PLAN:  1.  We will have Mr. Lieb follow up with Dr. Lequita Halt for the right knee      pain.  2.  We will refill the following medications for him:  Lidoderm 5% up to      three patches 12 hours on/12 hours off (three refills).  We will      increase the Topamax to 50 mg one p.o. b.i.d. (#60) (two refills),      Endocet 7.5/500 one p.o. t.i.d. (#  90), MS Contin 15 mg one p.o. b.i.d.      (#60).  He has been stable on these medications.  He has displayed no      aberrant behavior.  He is staying quite functional working 40 hours a      week with the use of medications.  No problems with constipation or      oversedation.  3.  We will consider medial branch block in this gentleman possibly in May      or June.  We will have nursing evaluate him over the next 2 months.  I      will see him back on the third month.           ______________________________  Brantley Stage, M.D.     DMK/MedQ  D:  08/30/2005 14:43:25  T:  08/31/2005 21:34:16  Job #:  [id number]

## 2010-10-28 NOTE — Consult Note (Signed)
NAME:  Blake Summers, Blake Summers                         ACCOUNT NO.:  [id number]   MEDICAL RECORD NO.:  [id number]                   PATIENT TYPE:  REC   LOCATION:  FOOT                                 FACILITY:  [facility]   PHYSICIAN:  Jonelle Sports. Sevier, M.D.              DATE OF BIRTH:  02-27-1946   DATE OF CONSULTATION:  10/16/2002  DATE OF DISCHARGE:                                   CONSULTATION   REPORT TITLE:  FOOT CENTER CONSULTATION/CLINIC NOTE.   REFERRING PHYSICIAN:  Sean A. Everardo All, M.D. [facility]   HISTORY:  This pleasant 65 year old white male is seen at the courtesy of  Dr. Everardo All for assistance with management of a chronic area of cellulitis  and weeping on the left lower extremity.   The patient has been morbidly obese for a number of years and has numerous  problems to include obstructive sleep apnea, cardiomegaly, probable right  heart failure, and chronic edema.   With that background history, he began approximately August 2003, with an  area of redness in the pretibial areas bilaterally.  This was relatively  minimal on the right and did not progress.  On the left side, however, it  began to weep and, accordingly, has been treated with Neosporin and hydrogen  peroxide cleansing on a daily basis.  The area has worsened, gotten  progressively larger, more weepy, and redder over time, and this has  persisted despite several courses of antibiotics, most recently Tequin 400  mg daily, which were begun two days ago.  In this area of redness are small  superficial vesicles which weep significantly.  There has been no  lymphangitis, no systemic symptoms suggestive of deeper infection.  Because  of his failure to respond and actual worsening, he is referred here now for  evaluation and advice.   PAST MEDICAL HISTORY:  1. Diabetic nephropathy.  2. Hyperlipidemia.  3. Old cerebrovascular accident.  4. Degenerative arthritis.  5. Obstructive sleep apnea.  6. Gastroesophageal  reflux.  7. Cardiomegaly.  8. Glaucoma.  9. Cellulitis, as mentioned.   ALLERGIES:  He has no known medicinal allergies.   REGULAR MEDICATIONS:  1. Ticlid.  2. Vitamin E.  3. Multivitamin.  4. Beconase nasal spray.  5. Glucophage.  6. Zantac.  7. Accupril.  8. Lasix.  9. Lovastatin.  10.      Zestril.  11.      Cartia.  12.      Glyset.  13.      Tiazac.  14.      Zaroxolyn.  15.      Celebrex.  16.      Neurontin.  17.      Vasotec.  18.      Cardizem.  19.      Allopurinol.  20.      Cosopt.  21.      Allegra.  22.  Colchicine.  23.      Several eye drops.  24.      Tequin, most recently 400 mg daily.   PHYSICAL EXAMINATION:  Examination today is limited to the distal lower  extremities.  The patient's legs are both substantially edematous with 3-4+  pitting edema at least to the knees.  Distal pulses are everywhere palpable  and are found to be biphasic on Doppler testing.  Skin temperatures are  normal and symmetrical.  The feet are free of significant calluses and  certainly free of ulceration.   On the distal anterior tibial area on the left extending toward the medial  aspect of the foot as well as covering the area of appropriately 100 x 80 mm  is an extremely reddened, shiny area with striking inflammation.  No  reasonably well-defined borders.  There is no purulence and no tenderness in  the area.   IMPRESSION:  Chronic edema with stasis dermatitis, likely presently  complicated by allergy to his NEOSPORIN.   DISPOSITION:  1. The wound is cleansed of any Neosporin and treated with 1% triamcinolone     cream.  It is then overlain with Bactroban.  A layer of Adaptic gauze is     placed over the wound, covered then with soft self-absorptive padding,     and that extremity is wrapped to the knee in a Profore wrap.  2. The patient will be seen in six days for reevaluation and is advised to     contact us sooner should he have breakthrough saturation of  his dressing     or any other such problem.  3. He is instructed to continue and complete his Tequin as previously     prescribed.  4. Followup visit will be to this clinic in six days.                                               Jonelle Sports. Cheryll Cockayne, M.D.    RES/MEDQ  D:  10/16/2002  T:  10/17/2002  Job:  [id number]   cc:   Gregary Signs A. Everardo All, M.D. [facility]

## 2010-10-28 NOTE — Assessment & Plan Note (Signed)
HISTORY OF PRESENT ILLNESS:  [name] is a 65 year old married white male  who is a patient of Dr. George Hugh. He is back in to our pain and  rehabilitative clinic for brief recheck and refill of his medications. Mr.  Blake Summers has been participating in a physical therapy program over on [address]. We have been attempting to improve his overall condition including  his range of motion and strength and endurance. However, there has been some  problems with his blood pressure and heart rate. His blood pressure has  typically been elevated the last few visits at 160/100 per physical therapy  notes and has been as high as 195/90. He also has been rather short of  breath during these therapy sessions as well. An attempt to use a Tens unit  has been made. He had some difficulty with the adherence of the electrodes.  This has been a chronic problem for him. He reports that his average back  pain has been about a 5 to 6 on a scale of 10. Right knee also bothers him.  Worse with any kind of stairs or prolonged walking or sitting for a  prolonged period of time. His back is worse with standing and walking for  any period of time.   PHYSICAL EXAMINATION:  VITAL SIGNS:  His blood pressure today is 143/62,  pulse 107, respiratory rate 18, 92% saturated on room air.  GENERAL:  He is alert, oriented. He is cooperative.  NEUROLOGIC:  His affect is overall bright. He is able to get out of the  chair. His gait in the room is stable and symmetric. He had some limitations  with forward flexion and mild limitations with extension. His motor strength  in the lower extremities is excellent. He has 5 over 5 at hip flexors, knee  extensors, dorsiflexors, plantar flexors, and EHL. He is demonstrating some  improved mobility in his hamstrings. Reflexes are 2+ at the knees and ankles  and toes are downgoing. He has no clonus. No sensory deficits on examination  today.   IMPRESSION:  1.  Lumbago with lumbar  spondylosis.  2.  Right knee was also evaluated. There is no crepitus noted today. Mild      tenderness along the joint lines but no effusion is noted. Overall knee      is stable.  3.  History of diabetes.  4.  C-PAP with history of sleep apnea.  5.  History of heart disease in the family.  6.  Shortness of breath with exertion.   PLAN:  At this point, given his shortness of breath with mild exertion as  well as elevated blood pressure during therapy visits ranging from 160/100  to 195/90, would like to let Dr. Everardo All know that we are going to  discontinue therapy and the conditioning program due to his blood pressure  at this time. We also have concerns about his shortness of breath.  Apparently he may be undergoing some sort of cardiac workup at this time. I  believe he may have an echocardiogram pending. I do not have any notes  regarding this. Would like to send a copy of my note today over to Dr.  Everardo All with explanation as to discontinuing physical therapy with Mr.  Blake Summers. We will go ahead and continue to treat him with some medications  including Topamax, which we have just started. Will start him on 25 mg 1  p.o. q.d. for 5 days and then increase him to  twice a day. Will also refill  his Percocet 7.5/500 1 p.o. t.i.d. p.r.n. He will be given 90 units. Again,  we reviewed the use of narcotics with him, concepts of dependency, tolerance  and withdrawal symptoms. He understands. There is no apparent barriers to  communication today. He  recently had his MS Contin refilled and will not need to do that today. He  also has been using Lidoderm with some improvement. Again, will send a copy  of this note over to Dr. Everardo All and will see [name] back in a month.       DMK/MedQ  D:  05/25/2004 18:23:25  T:  05/25/2004 20:37:08  Job #:  [id number]   cc:   Gregary Signs A. Everardo All, M.D. [facility]

## 2010-10-28 NOTE — Assessment & Plan Note (Signed)
MEDICAL RECORD NUMBER:  [id number].   [name] is a 65 year old married white gentleman who is being seen in our  pain and rehabilitative clinic for chronic low back pain and right knee  pain. He is accompanied by his wife today.   He is back in for a refill of his medications.   Last refill was November 30, 2004. He is about 11 days early on his refill.   His average pain in his low back is a 6 on a scale of 10 described as aching  and stabbing. The knee bothers him quite a bit with transitional movements  and when he is up walking.   Pain improves with rest and medications. He gets between fair and good  relief with his medications currently.   He can walk about 10 minutes at a time. He is able to climb stairs but  slowly. He does drive. Occasionally, he will use a cane.   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 Occasionally, he needs  assistance with dressing and some household duties and shopping. He is  independent with feeding, bathing, and toileting.   REVIEW OF SYSTEMS:  Positive for numbness, tingling, trouble walking.   Denies any new changes in past medical, social or family history.   PHYSICAL EXAMINATION:  Blood pressure 122/59, pulse 102, respirations 18,  96% saturated on room air. He is an obese white male. He does not appear in  any distress. He is oriented x3. His affect is bright, alert, and  cooperative, pleasant.   He is able to stand after being seated without any difficulty. His lumbar  range is limited in all planes. He is able to climb onto the exam today. His  reflexes are 2+ at the knees, 0 to 1+ at the ankles. He has good strength at  hip flexors, knee extensors, dorsi flexors, plantar flexors, EHL. Straight  leg is negative. No sensory deficit with light touch today.   Examination of the right knee reveals medial joint line tenderness. No AP or  lateral instability noted. He does have crepitus with flexion and extension  over the patella.  No swelling is noted or effusions.   IMPRESSION:  1.  Osteoarthritis, right knee.  2.  Lumbago secondary to lumbar spondylosis.  3.  Morbid obesity.  4.  Diabetes.  5.  Sleep apnea.  6.  Shortness of breath with exertion.   PLAN:  We will have him followup with Dr. Despina Hick for his right knee. We  tried to get him at last visit. Apparently he has not gotten scheduled yet.  Will refill his medications next week which will include MS Contin 15 mg 1  p.o.  b.i.d., Lidoderm, Endocet 7.5/500 one p.o. t.i.d. #90, Topamax 25 mg 1 p.o.  t.i.d. #90. I will see him back then in a month.       DMK/MedQ  D:  12/21/2004 12:44:41  T:  12/21/2004 13:20:55  Job #:  [id number]

## 2010-10-28 NOTE — Assessment & Plan Note (Signed)
MEDICAL RECORD NUMBER:  [id number].   REFERRING PHYSICIAN:  Dr. Everardo All.   Blake Summers is a 65 year old gentleman who is accompanied by his wife this  morning. He is being seen in our pain and rehabilitative clinic for chronic  low back pain and right knee pain.   The last visit, he gave a history of suggestive of lumbar spinal stenosis.  MRI was obtained, and he underwent open MRI on April 02, 2005. Study was  somewhat limited technically. However, it was apparent he had mild to  moderate narrowing biforaminally at L4-5 as well as moderately stenotic  foramina at L5-S1. This study was reviewed with him using a spine model, and  his wife took notes.   His average pain is about a 7 on a scale of 10. He describes his pain as  intermittent, stabbing, and aching in nature. He gets fair relief with his  current medications. His pain is worse with walking and standing. He is  limited in how long he can be up walking, not more than 10  minutes,  sometimes even less than that. His sleep is fair. He continues to work 40  hours a week. He has difficulty climbing stairs. He does drive. He needs  assistance with dressing, household duties and shopping, but he is  independent with feeding, toileting, bathing and meal prep. Health and  history form are reviewed today. Denies suicidal ideation. Denies depression  and anxiety.   No change in past medical, social, or family history since last visit.   PHYSICAL EXAMINATION:  Blood pressure 122/57, pulse 100, respirations 16,  95% saturated on room air. He is a well-developed, obese gentleman who  appears his stated age. He does not appear in any distress. He is oriented  x3. His affect is bright, alert, cooperative and pleasant. He is able to  stand up after being seated. His gait is wide based and appears to a bit  antalgic. He has limitations in lumbar range of motion in all planes, feels  a little better when he extends back actually today.  Forward flexion bothers  him somewhat. Seated reflexes are 1 to 2+, symmetric. Motor strength is  excellent in the lower extremities. No focal deficits. Straight leg raise is  negative.   IMPRESSION:  1.  Lumbago secondary to lumbar spondylosis.  2.  New diagnosis of spinal stenosis after reviewing MRI report. He has      biforaminal narrowing at L4-5 and L5-S1.  3.  Morbid obesity.  4.  Diabetes.  5.  Sleep apnea.  6.  Right knee osteoarthritis.  7.  Shortness of breath with exertion.   PLAN:  We discussed treatments options with him regarding his lumbar spine  as far as just continuing options included doing nothing, medical management  with current medications, epidural injection.   He would like to pursue epidural injection at this time. We will have him  follow up with Dr. Wynn Banker for this. We will refill his current  medications to include MS Contin 15 mg 1 p.o. b.i.d. #60 no refills, Endocet  7.5/500 one p.o.  t.i.d. #90 no refills. He also is on Topamax and Lidoderm which he does not  need refills on today. We will see him back in a month and set him up for  epidural steroid injections for his lumbar stenosis.           ______________________________  Blake Summers, M.D.     DMK/MedQ  D:  04/26/2005 13:06:09  T:  04/26/2005 17:22:03  Job #:  [id number]

## 2010-10-28 NOTE — Op Note (Signed)
Blake Summers, Blake Summers               ACCOUNT NO.:  [id number]   MEDICAL RECORD NO.:  [id number]          PATIENT TYPE:  OIB   LOCATION:  0007                         FACILITY:  [facility]   PHYSICIAN:  Crecencio Mc, M.D.       DATE OF BIRTH:  30-Mar-1946   DATE OF PROCEDURE:  03/26/2006  DATE OF DISCHARGE:                                 OPERATIVE REPORT   PREOPERATIVE DIAGNOSIS:  Left kidney stone.   POSTOPERATIVE DIAGNOSIS:  Left kidney stone.   PROCEDURE:  1. Cystoscopy.  2. Left ureteroscopic stone manipulation.  3. Left ureteral stent placement.   ATTENDING SURGEON:  Crecencio Mc, M.D.   RESIDENT:  Terie Purser, MD   ANESTHESIA:  General endotracheal.   COMPLICATIONS:  None.   DRAINS:  6-French 24 cm double-J ureteral stent.   SPECIMEN:  Stone fragment for analysis.   DISPOSITION:  Stable to post anesthesia care unit.   INDICATIONS FOR PROCEDURE:  [name] is a 65 year old male who has  recently had a symptomatic left-sided kidney stone.  This revealed to be  approximate 1 cm stone in level of the left UPJ.  Various treatment options  were discussed and ultimately we decided that ureteroscopic procedure in  this patient would be most likely to successfully treat his stone.  The  patient agreed to proceed after discussion of benefits and risks.   DESCRIPTION OF PROCEDURE:  The patient was brought to the operating room  properly identified.  Time-out performed confirm correct patient, procedure  and side.  Administered general anesthesia given preoperative antibiotics  and placed in dorsal lithotomy position and prepped and draped sterile  fashion.  We first performed cystoscopy using a 22-French sheath 12 degrees  lens.  Anterior posterior urethra was normal.  There was no evidence of  stone or foreign body in the bladder.  There was no obvious mucosal  abnormalities.  Both ureteral orifices were identified in normal anatomic  position.  We next placed a Sensor wire up the  left ureter under direct  vision and fluoroscopic guidance.  Curl was noted in the pelvis.  There was  evidence of a stone noted on the left UPJ region on scout imaging.  We next  placed a ureteral access sheath over the existing wire.  The inner sheath  was removed and then the flexible ureteroscope was advanced alongside the  wire to the level of the renal pelvis.  Systematic examination of the kidney  was performed.  We did note a large approximately 1 cm stone in the inferior  portion of the kidney.  We next proceeded to fragment the stone using the  holmium laser settings of 0.8 and 8.  This was increased to 1and 8 during  treatment.  The stone was sufficiently fragmented into multiple tiny pieces.  A small piece was removed with a nitinol basket for analysis.  The remaining  stone fragments were sufficiently fragmented.  We systematically examined  the remaining aspects of the kidney.  There was no additional stone  fragments which required treatment.  At this point the ureteroscope was  removed.  A 6-French 24 cm double-J ureteral stent was placed under  cystoscopic fluoroscopic guidance.  Good curl was noted in the kidney as  well as in the bladder.  The patient's bladder was then emptied.  He was  awakened from anesthesia and transported to recovery room in stable  condition.  This are no complications.  Please note that Dr. Laverle Patter was  present and participated in all aspects of this procedures.     ______________________________  Terie Purser, MD    ______________________________  Crecencio Mc, M.D.    JH/MEDQ  D:  03/26/2006  T:  03/27/2006  Job:  [id number]

## 2010-10-28 NOTE — Procedures (Signed)
NAMEJATHNIEL, Blake Summers               ACCOUNT NO.:  [id number]   MEDICAL RECORD NO.:  [id number]          PATIENT TYPE:  REC   LOCATION:  TPC                          FACILITY:  [facility]   PHYSICIAN:  Erick Colace, M.D.DATE OF BIRTH:  01-Apr-1946   DATE OF PROCEDURE:  08/03/2005  DATE OF DISCHARGE:                                 OPERATIVE REPORT   MEDICAL RECORD NUMBER:  [id number].   [name] returns today. He has had 50% pain relief with bilateral SI joint  injections, preinjection 6, postinjection 0. He is at a 2 at rest right now.  However, with activity, he goes back up to around a 6. He wishes to receive  another injection given that he is already on MS Contin and Endocet as well  as Topamax and Lidoderm with only partial relief.   Informed consent was obtained after describing risks and benefits of the  procedure to the patient. These include bleeding, bruising, infection, loss  of bowel and bladder function, temporary or permanent paralysis, and he  elects to proceed. The patient placed prone on fluoroscopy table, Betadine  prep, sterile drape. A 25-gauge 1-1/2-inch needle was used to anesthetize  skin and subcu tissues with 2 cc of 1% lidocaine on each side. Then 3.5-inch  22-gauge spinal needle was inserted first into the left SI joint under  fluoroscopic guidance, used both AP and lateral imaging. Omnipaque 180 x0.5  cc demonstrated no intravascular uptake. Then a solution containing 0.5 cc  of 40 mg/cc Depo-Medrol and 1 cc of 2% methylparaben-free lidocaine were  injected. Next, the C arm was obliqued contralaterally. Right SI joint was  visualized, entered with 22-gauge 3-1/2-inch spinal needle under  fluoroscopic guidance. Omnipaque 180 under live fluoro demonstrated no  intravascular uptake. Then 0.5 cc of 40 mg/cc of Depo-Medrol and 1 cc of 2%  methylparaben-free lidocaine were injected. The patient tolerated the  procedure. Post injection instructions given.   We  will go ahead and give him information on radiofrequency neurotomy. The  patient will follow up with Dr. Pamelia Hoit, and he will decide in  conjunction with Dr. Pamelia Hoit whether or not to proceed on with the  radiofrequency of the sacroiliac joint and L4 median branch and L5 dorsal  ramus. It would have to be done first on the right side and then possibly on  the left side.      Erick Colace, M.D.  Electronically Signed     AEK/MEDQ  D:  08/03/2005 16:09:54  T:  08/04/2005 09:37:13  Job:  [id number]   cc:   Brantley Stage, M.D.  Fax: [phone]

## 2010-10-28 NOTE — Procedures (Signed)
Blake Summers, Blake Summers               ACCOUNT NO.:  [id number]   MEDICAL RECORD NO.:  [id number]          PATIENT TYPE:  REC   LOCATION:  TPC                          FACILITY:  [facility]   PHYSICIAN:  Erick Colace, M.D.DATE OF BIRTH:  Sep 18, 1945   DATE OF PROCEDURE:  06/01/2005  DATE OF DISCHARGE:                                 OPERATIVE REPORT   MEDICAL RECORD NUMBER:  [id number].   PROCEDURE:  L4-5 right paramedian translaminar epidural steroid injection  under fluoroscopic guidance.   REASON FOR INJECTION:  Low back pain secondary to lumbar spondylosis as well  as lumbar foraminal stenosis, L4-5, L5-S1, and radicular symptoms. His pain  is only partially responsive to narcotic analgesics, adjuvant therapy, and  other conservative care. His pain limits his functional activities.   Informed consent was obtained after describing risks and benefits of the  procedure to the patient. These include bleeding, bruising, infection, loss  of bowel and bladder function, temporary or permanent paralysis, and he  elects to proceed. The patient placed prone on fluoroscopy table, Betadine  prep, sterile drape. A 25-gauge 1-1/2-inch needle was used to anesthetize  skin with 2 cc of 1% lidocaine. An 18-gauge 6-inch Tuohy needle was inserted  under fluoroscopic guidance. First pass, I hit posterior elements but  without loss of resistance with lateral imaging utilized during needle  advancement. Needle was switched out to an 18-gauge 3-1/2-inch spinal  needle, and this was inserted into the epidural space under both AP and  lateral imaging and confirmed with both loss-of-resistance technique as well  as Omnipaque 180 x1 cc under live fluoro demonstrated good epidural spread,  no intravascular uptake. No myelographic pattern.   Then a solution containing 2 cc of 40 mg/cc Depo-Medrol, 2 cc of 1%  lidocaine and 2 cc of 0.9 preservative free normal saline were injected. The  patient tolerated the  procedure well. Pre-injection pain level 8/10. Post-  injection pain level 0/10. I will see her back in three weeks. Reinject if  he has temporary results.      Erick Colace, M.D.  Electronically Signed     AEK/MEDQ  D:  06/01/2005 15:01:49  T:  06/02/2005 16:53:27  Job:  [id number]

## 2010-10-28 NOTE — Assessment & Plan Note (Signed)
[facility]                                   ON-CALL NOTE   NAME:TROXELJamon, Hayhurst                      MRN:          [id number]  DATE:02/22/2006                            DOB:          [birth year]    TIME:  5:12 p.m.   Phone number was [phone], and the caller was Corrie Dandy at [facility].   OBJECTIVE:  Patient had a stone protocol STAT CT done.  There is a 1 cm  stone in the left renal pelvis with minimal hydronephrosis.  The patient has  medications from the office.  It does not sound like it is a stone that  could be causing problems in the ureter; therefore, I told the imaging  service to have the patient go on home.   Left-sided flank pain, presumably with hematuria.   PLAN:  I told the patient to go home.  Dr. Jonny Ruiz or his coverage doctor will  receive the result tomorrow and probably be in touch with the patient.  Primary care Lorana Maffeo is Dr. Jonny Ruiz.  Home office is [place].                                   Arta Silence, MD   RNS/MedQ  DD:  02/22/2006  DT:  02/23/2006  Job #:  [id number]   cc:   Corwin Levins, MD

## 2010-10-28 NOTE — Assessment & Plan Note (Signed)
Blake Summers is a 65 year old married gentleman who is being seen in our  [facility] and Rehabilitative Clinic predominantly for low back pain and right  knee pain.  He was last seen by me on May 11, 2006.  He has had a  nursing visit in the interim.  He states that he saw Dr. Lequita Halt at the  end of December for a right knee injection.  He was also treated for a  respiratory infection with antibiotics over the holidays.   He is back in and states his average pain in the low back is about a 5  on a scale of 10, described as intermittent, stabbing and aching in  nature.  Sleep is fair.  Good relief with the current meds that he is  on.   He is walking about 15 minutes at a time.  He is independent with his  self care with the exception of dressing; he needs some assistance with  lower extremity dressing.   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   No other changes noted in the past medical, social or family history  since our last visit.   Medications prescribed by this clinic include:  1. Percocet 7.5/500 one p.o. t.i.d. p.r.n. back or knee pain, #90.  2. MS Contin 15 mg 1 p.o. b.i.d. #60.  3. Lidoderm p.r.n.  4. Topamax 50 mg 1 p.o. b.i.d.   Blake Summers is expressing some concern about Topamax related to kidney  stones.   VITAL SIGNS:  Today are 147/63, pulse 109, respirations 18, 96%  saturated on room air.  He is an obese, white gentleman who appears his stated age.  He does not  appear in any distress.  He is oriented times 3.  His speech is clear.  He follows commands  without difficulty.  His affect is bright, alert.  He is cooperative and  smiling today.   He transitions from sit to stand without difficulty.  His gait in the  room in non-antalgic.  He has limitations in lumbar motion in all  planes.  He has refluxes that are diminished at the patellar tendons and  Achilles tendons.  He has good motor strength at hip flexors, knee  extensors, dorsa  flexors and plantar flexors.  No numbness is  appreciated with light touch.   IMPRESSION:  1. Bilateral knee osteoarthritis, right worse than left.  2. Lumbar stenosis.  3. Lumbar spondylosis.  4. History of exertional shortness of breath.  5. History of enlarged heart.  6. Recent kidney stone.   Attempts to discuss it with Dr. Laverle Patter have been unsuccessful. I have  not been able to talk with him yet.   Per Blake Summers request, we will wean his Topamax off. We will reassess  his pain and see how he does without the Topamax. He will drop to 1  tablet over the next week and then stop the following week.  We will  refill his Percocet 7.5/500 one p.o. t.i.d. p.r.n. back or knee pain,  #90.  We will refill his MS Contin 60 mg 1 p.o. b.i.d. #60, no refills  on either.  We will refill his Lidoderm as well.  Three refills up to 3  patches, 12 hours on, 12 hours off.   Blake Summers will let us know how he does off of the Topamax.  If he is  having increased pain or more difficulty with walking, he may give [place] a  call  and ask [place] to refill this medication for him.  We will see him back  in about 6 weeks.  He has been stable on these medications, he has not  had any problems with the narcotics, no problems with constipation or  over sedation and he is functioning at a fairly high level with their  use.  He continues to maintain contact with Dr. Everardo All for his other  medical problems and he sees Dr. Lequita Halt on an intermittent basis for  his right knee.           ______________________________  Blake Summers, M.D.     DMK/MedQ  D:  07/06/2006 14:09:42  T:  07/06/2006 16:00:14  Job #:  [id number]

## 2010-10-28 NOTE — Procedures (Signed)
Blake Summers, Blake Summers               ACCOUNT NO.:  [id number]   MEDICAL RECORD NO.:  [id number]          PATIENT TYPE:  REC   LOCATION:  TPC                          FACILITY:  [facility]   PHYSICIAN:  Erick Colace, M.D.DATE OF BIRTH:  1945/06/17   DATE OF PROCEDURE:  07/06/2005  DATE OF DISCHARGE:                                 OPERATIVE REPORT   MEDICAL RECORD NUMBER:  [id number].   INDICATIONS FOR PROCEDURE:  Chronic low back pain, only partially responsive  to medication management including MS Contin, Endocet, Topamax and Lidoderm.  His pain limits his functional activity.   He had a right paramedian translaminar L4-5 epidural steroid injection under  fluoroscopic guidance on June 01, 2005 which gave him temporary relief  of pain, i.e. several hours. Preinjection was 8/10. Postinjection 0/10.   He notes that he mainly has axial back pain and notes that it is below the  belt line. He has tenderness over the PSIS bilaterally.   Informed consent was obtained after describing risks and benefits of the  procedure to the patient. These include bleeding, bruising, infection, loss  of bowel and bladder function, temporary or permanent paralysis, and he  elects to proceed. The patient placed prone on fluoroscopy table, Betadine  prep, sterile drape. A 25-gauge 1-1/2-inch needle was used to anesthetize  skin and subcu tissues with 2.5 cc of 1% lidocaine on each side. Then a 5-  inch, 22-gauge spinal needle was inserted under the right SI joint under  fluoroscopic guidance, confirmed with lateral imaging. Omnipaque 180 x0.5 cc  demonstrated no intravascular uptake. Then a solution containing 0.5 cc of  40 mg/cc Depo-Medrol and 1 cc of 2% methylparaben-free lidocaine were  injected. Next, C arm was obliqued towards the left side. The left SI joint  was visualized and entered with a 22-gauge 3-1/2-inch spinal needle. The  lateral images confirmed proper placement. Omnipaque 180 under  live fluoro  AP demonstrated no intravascular uptake, and then 0.5 cc of 40 mg/cc Depo-  Medrol and 1 cc of 2% methylparaben-free lidocaine were injected. The  patient tolerated the procedure well. Post-injection instructions given. Pre-  injection pain level 6, post-injection pain level 0. We will see him back in  about four weeks and decide whether we do not need any injections at that  time, repeat SI versus trial of facet injection.      Erick Colace, M.D.  Electronically Signed     AEK/MEDQ  D:  07/06/2005 13:59:12  T:  07/06/2005 15:18:51  Job:  [id number]   cc:   Brantley Stage, M.D.  Fax: [phone]

## 2010-10-28 NOTE — Assessment & Plan Note (Signed)
MEDICAL RECORD NUMBER:  [id number].   [name] is a 65 year old married white gentleman who is a patient of Dr.  George Hugh.   He is back in to our pain and rehabilitative clinic for refill of his  medications. He is being seen here for chronic low back pain and chronic  right knee pain. He is morbidly obese at approximately 337 pounds with  limited endurance overall.   His average pain is about a 7 on a scale of 10. He describes it as aching  and stabbing. It is intermittent pain depending on his activity level. Pain  is worse with walking and standing. Improves with rest, heat, ice,  medication. He is getting between fair and good relief with his current pain  medications. He sleeps fair at night as well. The worst time for his pain is  typically during the day. 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   For the most part, he can walk without assistance. Occasionally, he will use  a cane especially if his knee is bothering him. He can walk not more than  about 10 minutes at a time. He is able to climb stairs and drive.   He does require some assistance with dressing and shopping. Denies bowel or  bladder problems. Denies depression.      DMK/MedQ  D:  08/19/2004 12:47:43  T:  08/20/2004 08:36:12  Job #:  [id number]

## 2010-10-28 NOTE — Assessment & Plan Note (Signed)
Blake Summers is back in today for a brief recheck.  He has chronic low back  pain as well as some right knee pain.  He has been taking Topamax 25 mg 1  p.o. b.i.d. for the last month as well as Percocet 3 times a day and MS  Contin 15 mg twice a day.   He has been stable on these medications.  His average pain is about a 5 on a  scale of 10.  His pain does limit him and his general activity quite a bit.  However, he is also limited by his cardiovascular status and respiratory  status as well as his morbid obesity.   His average pain is typically a 6 on a scale of 10.  Average pain right now  in the office is about a 4.  His pain is intermittent and it is stabbing in  its nature.  Pain is worse during the day when he is up and moving around.  His sleep is overall fair.  Pain is worse with walking and standing,  improves with rest and medication.  He gets fair relief from his current  medications.   He is able to walk without assistance and he can walk between 8 and 10  minutes at a time.  He does drive.  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  He requires some assistance with dressing the lower  extremities and shopping.  Denies any problems controlling bowel or bladder.  Denies any suicidal ideation at this time.   He last saw his primary care doctor back in October.  I believe he has an  appointment in March of this year.  Denies any new changes in his medical  history since last visit.  Denies any changes in his social or family  history since last visit.   PHYSICAL EXAMINATION:  VITAL SIGNS:  His blood pressure is 132/58; pulse 95;  respirations 22; 96% saturated on room air.  GENERAL:  He is alert, oriented and cooperative, able to get out of the  chair without difficulty.  NEUROLOGIC:  His gait is somewhat wide-based due to his obesity, but stable.  He has limitations with flexion and extension in his lumbar spine.  He has  normal tone in the lower extremities.  He  has good strength at hip flexors,  knee extensors, dorsiflexors, plantar flexors, EHL.  He has pain along the  medial joint line of his right knee.  Reflexes are 1+ to 0 at the knees and  0 at the ankles.  No clonus is noted.   IMPRESSION:  1.  Lumbago with lumbar spondylosis.  2.  Morbid obesity.  3.  Right knee osteoarthritis.  4.  History of diabetes.  5.  History of sleep apnea, currently on CPAP.  6.  History of heart disease in the family.  7.  Shortness of breath with exertion.   PLAN:  Again, we will send Dr. Everardo All a note regarding Blake Summers's  elevation in blood pressure during his exercise and the fact that we needed  to discontinue his therapy due to his blood pressure.   We will increase his Topamax from 25 mg twice a day to three times a day.  We will refill his Percocet 7.5/500 t.i.d. as well as MS Contin 15 mg b.i.d.   He reports TENS unit was not particularly helpful for him.  He is looking  into doing some activity at the [facility]  He has  also seen dietary counseling  twice in the last couple of months.  We will see him back in 1 month.  Again, we will send a copy of this note to Dr. Everardo All.       DMK/MedQ  D:  06/29/2004 16:59:32  T:  06/29/2004 18:37:00  Job #:  [id number]   cc:   Gregary Signs A. Everardo All, M.D. [facility]

## 2010-10-28 NOTE — Assessment & Plan Note (Signed)
HISTORY OF PRESENT ILLNESS:  Blake Summers is a 65 year old gentleman who is  accompanied by his wife to our clinic this morning for refill of his  medications.   He is being seen in our [facility] and Rehabilitative Clinic for multiple pain  complaints including bilateral knee osteoarthritis, lumbar stenosis, lumbar  spondylosis.  He also has exertional shortness of breath, uses CPAP at night  and has a history of an enlarged heart.   He states that he is getting good relief with the current medications that  he is on.  His average pain is about a 6-5/10.  Pain is localized mainly to  the low back and into the right knee region.  He states he had, within the  last month, an injection by Dr. Lequita Halt to the right knee.   He states his pain is intermittently and describes it as aching and stabbing  in nature.  He reports overall poor sleep and he has requested a sleeping  medication from Dr. Everardo All who has started him on some Ambien.   He can walk about 10, 15, up to 20 minutes at a time.  He has difficulty  with stairs but can climb them. He is able to drive.  He is working 40 hours  a week.  He does use a cane intermittently.   He is requesting Art gallery manager for community distances.   HEALTH AND HISTORY FORM:  Positive for numbness, tingling and trouble  walking.  Denies depression, anxiety, suicidal ideation.  Denies problems  controlling bowel or bladder.  No new changes in past medical, social or  family history since our last visit.   PHYSICAL EXAMINATION:  VITAL SIGNS:  Blood pressure 130/59, pulse 103,  respirations 18, 96% saturation on room air.  GENERAL APPEARANCE:  He is an obese gentleman who appears his stated age.  He is oriented x3.  He does not appear in any distress.  His affect is  bright, alert.  He is cooperative, pleasant and relaxed.  NEUROLOGICAL:  He transitions from sit-to-stand without any difficulties.  His gait is a bit wide based due to his girth.  Seated,  his reflexes are  symmetric and intact in the lower extremities.  Motor strength is good in  both lower extremities.  No new sensory deficits are appreciated.  He has  tenderness along the right knee joint line.   IMPRESSION:  1. Bilateral knee osteoarthritis.  2. Lumbar stenosis.  3. Lumbar spondylosis.  4. History of exertional shortness of breath.  5. History of enlarged heart.   PLAN:  We will refill Mr. Kalisz medication for him today.  He will  prescribe MS Contin 15 mg one p.o. b.i.d. #60,  Endocet 7.5/500 one p.o.  t.i.d. #90, no refills on either.  He does not need any refills on Lidoderm  nor Topamax today.  We may consider having him follow up with a therapist  for assessment of a scooter or wheelchair for longer community distances  which he is unable to negotiate at this time.  He does have an appointment  with Dr. Aurora Mask, his respiratory specialist, and I have requested follow up  __________.          ______________________________  Brantley Stage, M.D.    DMK/MedQ  D:  02/16/2006 10:06:20  T:  02/16/2006 11:41:35  Job #:  [id number]

## 2010-10-28 NOTE — Assessment & Plan Note (Signed)
Blake Summers is a pleasant, married 65 year old gentleman who is being seen in  our [facility]itative [facility] for chronic low back pain as well as some  right knee pain.  He has been controlled fairly well on MS Contin 15 mg  twice a day as well as Endocet 7.5/500 three times a day.  He also has been  taking Topamax three times a day and uses Lidoderm on a p.r.n. basis.  With  these medications, he reports good relief of his pain.   However, he is complaining that he has difficulty standing for any length of  time and he has had difficulty walking for any distance.  He is down to  about 10 minutes and he reports he seems to be able to go a little further  when he leans over a shopping cart.  His pain in his low back improves quite  a bit when he sits down.   FUNCTIONAL STATUS:  He continues to work 40  hours a week.  He is  independent with feeding, bathing, toileting, meal prep.  He does require  some assistance with dressing and shopping.   REVIEW OF SYSTEMS:  Positive for numbness and tingling in the lower  extremities especially with standing and walking.  No new change in review  of systems in constitutional, gastrointestinal, urinary, cardiorespiratory.   No change in past medical, social or family history since last visit.   PHYSICAL EXAMINATION:  GENERAL APPEARANCE:  He is a well-developed, morbidly  gentleman who does not appear in any distress.  He is oriented x3.  His  affect is bright and alert.  He is cooperative and pleasant, talkative  today.  VITAL SIGNS:  Blood pressure 156/77, pulse 99, respirations 16, 96%  saturated on room air.  NEUROLOGIC:  He is able to stand up after being seated.  His gait is wide  based with a short stride length but quite stable.  Seated, his reflexes are  2+ at the patellar tendon, 0 at the ankles.  He has excellent motor strength  in the lower extremities.  No focal weakness is noted.   IMPRESSION:  1.  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  2.  He does have suggestion of spinal stenosis with regard to his history of      diminished ability to walk and stand for any period of time with pain      radiating from the low back into the buttocks and posterior thighs.  3.  Morbid obesity.  4.  Diabetes.  5.  Sleep apnea.  6.  Shortness of breath with exertion.   PLAN:  Will refill his medications today.  1.  MS Contin 15 mg one p.o. b.i.d. #60.  2.  Endocet 7.5/500 one p.o. t.i.d. p.r.n. low back pain #90.  3.  He does not need another prescription for the Topamax or the Lidoderm.      He has three refills left on each.   Will also set him up for an MRI of his lumbar spine to delineate any  evidence for lumbar stenosis.  Would consider a lumbar epidural injection.  Will see him back in a month.  Review lumbar spine at that time.           ______________________________  Brantley Stage, M.D.     DMK/MedQ  D:  03/29/2005 13:17:59  T:  03/29/2005 14:18:14  Job #:  [id number]

## 2010-10-28 NOTE — Assessment & Plan Note (Signed)
HISTORY OF PRESENT ILLNESS:  [name] is a 65 year old married white male  who is accompanied by his wife for a recheck at our Pain and Rehabilitative  Clinic today.   [name] has a history of lumbar spondylosis and low back pain, right knee  osteoarthritis as well as a history of diabetes and sleep apnea on CPAP and  a positive family history for heart disease.  He has been in physical  therapy with cardiac precautions.  They have been working on some lower  extremity stretching and strengthening with him.   He is not sure if it is helping his back at this point.   He is doing well in his pain medications.  No complications with over  sedation or constipation.   He has continued to stay quite active.  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  He is able to drive.  Is able to take care of his self  care needs without any difficulty.  Sleep, overall, is fair.  No suicidal  ideations.  No new changes in his medical history since last visit.   MEDICATIONS:  1.  Pain medications he is currently using include Lidoderm 1-3 patches, 12      hours on, 12 hours off.  2.  He has been on Neurontin 600 mg q.8h.  3.  Takes Percocet 7.5/500 one p.o. t.i.d.  4.  Takes morphine sulfate 15 mg 1 p.o. b.i.d.   He appears to be tolerating all these medications.  We have decreased his  Neurontin.  He notices a bit more increased leg pain.   PHYSICAL EXAMINATION:  GENERAL APPEARANCE:  Alert, oriented, cooperative,  pleasant in his affect.  VITAL SIGNS:  His blood pressure is 146/78, pulse 106, respirations 20, 95%  saturating on room air.  NEUROLOGICAL:  He is able to get out of the chair.  Gait in the room is slightly wide based due to his body habitus.  His gait  is stable.  However, Romberg's test is negative.  Seated reflexes are 1-2+  at the knees and ankles.  Toes are downgoing.  No clonus.  He has no obvious  edema or fluid in the right knee.  No crepitus is noted today.  He does  have  some mild right knee joint line tenderness with palpation, however.   Motor strength is excellent in the lower extremities with the hip flexors,  knee extensors, dorsiflexors, plantar flexors, EHL.  He has tight hamstrings  bilaterally.  He has limitations with forward flexion in his lumbar spine.  He does not have any pain with forward flexing today.  In fact, his  movements are quite fluid.  No pain behaviors are noted.  Sensation is  intact.   IMPRESSION:  1.  Lumbago with lumbar spondylosis.  2.  Right knee osteoarthritis with right knee pain.  3.  History of diabetes.  4.  CPAP with history of sleep apnea.  5.  History of heart disease in the family.   PLAN:  Discussed physical therapy with [name].  Apparently, he feels it  has not improved his back pain.  We discussed that the purpose is to  maintain his lower extremity strength and flexibility, to work on hip  extensor strength and quadriceps strength so he can continue to get out of  the chair without difficulty and climb stairs without falling to maintain  what strength he currently has.   He is interested in continuing to come off  the Neurontin and trying Topamax.  Over the next several weeks we will decrease his Neurontin gradually, and  start him on Topamax in approximately three weeks.  The risks and benefits  of Topamax versus Neurontin were discussed with him as well.   We will have him continue the Lidoderm.  He finds this to be particularly  helpful, especially over his right knee.  He can use 1-3 patches as needed,  #90 units given him today.  We will also refill Percocet to date 7.5/500 one  p.o. t.i.d. p.r.n.  He has not had any difficulty with this medication.  He  is appropriate with its use.   Will also get him a TENS unit trial at the Physical Therapy Department.  Will decrease him to about once a week with the therapy.  He is having a  hard time going three times a week while he is working.   Would also  recommend dietary counseling for weight loss.  Will get him involved in  that.  Discussed this with him.  He was also given some information on the  local [facility] regarding arthritis programs, and he will, over the next month,  take ibuprofen about 400 mg 3-4 times a day while he comes off the  Neurontin. He is currently taking Zantac as well.  Will see him back in a  month.  He will call if he has any difficulty weaning off his medications  and starting the Topamax.  Will have a copy of this note sent over to Dr.  Everardo All.       DMK/MedQ  D:  04/25/2004 12:10:27  T:  04/25/2004 13:21:35  Job #:  [id number]   cc:   Gregary Signs A. Everardo All, M.D. [facility]

## 2010-10-28 NOTE — Assessment & Plan Note (Signed)
MEDICAL RECORD NUMBER:  [id number]   DATE OF BIRTH:  07-Oct-1945   HISTORY OF PRESENT ILLNESS:  Blake Summers is a 65 year old married white  gentleman who is being seen in our pain and rehabilitative clinic with  chronic low-back pain and right knee pain.   He has seen Dr. Lequita Halt within the last month and had his right knee  candidate. He is not felt to be a candidate for a total knee replacement due  to his medication condition.   Blake Summers tells me he has another appointment with Dr. Lequita Halt in the next  day or two, and also has a colonoscopy planned in the next couple of weeks  as well.   His back and right knee pain is controlled with MS Contin 15 mg twice a day,  Endocet 7.5/500 three times a day, Topamax t.i.d. 25 mg, and Lidoderm. He  has not had any problems with the medication, occasional constipation which  is taken care of with a bowel stimulant.   His average pain varies between a 4 and a 7 on a scale of 10. Relief from  medications is between fair and good. Pain is described as varying between  aching and stabbing.   He will occasionally use a cane. He is able to climb stairs and drive. He  walks about 10 minutes at a time. 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 He is a fairly high-functioning individual. Does need occasional  help with dressing lower extremity.   REVIEW OF SYSTEMS:  Positive for intermittent numbness, tingling, trouble  walking, and diabetes.   Past medical, social, family history unchanged other than in history of  present illness.   EXAMINATION:  Blood pressure 131/58, pulse 86, respirations 16, 96%  saturated on room air. He is a well-developed, obese gentleman who does not  appear in any distress. He is oriented x3. His affect is bright. He is  cooperative and alert and in good spirits.   He is able to stand up after being seated. He has a slightly antalgic gait  with decreased weightbearing in the right lower extremity. His  motor  strength is excellent in the lower extremities at hip flexors, knee  extensors, dorsiflexors, plantar flexors, EHL in the 5/5 range. Straight leg  raise negative. No swelling noted around the knee. Tenderness bilateral  joint lines.   IMPRESSION:  1.  Lumbago secondary to lumbar spondylosis.  2.  Osteoarthritis right knee.  3.  Morbid obesity.  4.  Diabetes.  5.  Sleep apnea.  6.  Shortness of breath with exertion.   PLAN:  Will refill the following medications for him today:  1.  Lidoderm one to three patches, 12 hours on, 12 hours off, #90.  2.  Topamax 25 mg one p.o. t.i.d., #90.  3.  MS Contin 15 mg one p.o. b.i.d., #60.  4.  Endocet 7.5/500 one p.o. t.i.d., #90.   We will see him back in a month. He has been stable on these medications, no  aberrant behavior, and is using them appropriately.           ______________________________  Brantley Stage, M.D.     DMK/MedQ  D:  03/01/2005 09:53:22  T:  03/01/2005 13:54:54  Job #:  [id number]

## 2010-10-28 NOTE — Assessment & Plan Note (Signed)
DATE OF SERVICE:  July 28, 2004.   MEDICAL RECORD NUMBER:  [id number].   DATE OF BIRTH:  02-17-1946.   Blake Summers is a 65 year old, married, white male who is accompanied by his  wife this morning.  He is being seen in our pain and rehabilitative clinic  for pain management of chronic low back pain and right knee osteoarthritis.   He is back in today.  He reports his pain is typically about a 7 on a scale  of 10.  He described it as his back and knee as stabbing and aching  intermittently.  Sleep has been fair.  The pain is typically worsened with  walking and standing.  It improves with rest, heat and medication.  With the  currently pain medications, he is getting between fair and good relief.   He is currently on Topamax 25 mg three times a day, Percocet 7.5 mg/500 mg  with acetaminophen three times a day and MS Contin 15 mg one p.o. b.i.d.   He has no complaints with these medications.  He is also using Lidoderm  daily as well up to three patches a day.   He is able to walk without assistance.  He will use a cane occasionally.  He  is able to climb stairs and he does drive.  He can walk about 10 minutes at  a time, limited somewhat by shortness of breath, as well as back and knee  pain.   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  He does need  assistance with shopping and some dressing.  Otherwise he is independent  with all of his activities of daily living.   He denies bowel or bladder control problems.  He does admit to some numbness  and tingling in the lower extremities and some trouble walking because of  the knee pain.  Denies depression, anxiety or suicidal ideation.   REVIEW OF SYSTEMS:  Positive for variations in blood sugar, reflux,  wheezing, shortness of breath and sleep apnea.   No new changes with his review of systems since last visit.  Regarding his  cardiac status, he has recently seen Dr. Everardo All on July 09, 2004.  Apparently  he has been told he has an enlarged heart.  He had recently  done a treadmill test.  The results are unknown at this point.   No changes in social or family history since last visit.   PHYSICAL EXAMINATION:  Blood pressure 127/70, pulse 100, respirations 24,  96% saturated on room air.  He is an obese white male in no apparent  distress during our interview.  He is oriented x 3.  His affect is overall  bright and alert.  He is able to independently stand from a seated position.  His gait is somewhat wide based and stable, however.  He does not appear to  have any antalgia with the knee during gait in the exam room today.  He has  increased pain with forward flexion, extension and lateral extension of his  lumbar spine.  The pain is located in the mid to low lumbar area.  It does  not increase with palpation in the paraspinal musculature or over the spine.  He reports the pain as being rather deep.  Seated reflexes are 2+ at the  knees and 0 at the ankles.  He has excellent strength in the lower  extremities and 5/5 at hip flexors, knee extensors, dorsiflexors, plantars  and EHL.  He has negative straight leg raise.  He has no obvious swelling  about the knee.  No medial or lateral instability or AP instability noted.  He does have some joint line tenderness along the medial joint line on the  right.  No crepitus with internal or external rotation.  He has full range  of motion at the knee.   IMPRESSION:  1.  Lumbago with lumbar spondylosis.  2.  Morbid obesity.  3.  Right knee osteoarthritis.  4.  History of diabetes.  5.  History of sleep apnea.  6.  Undergoing recent cardiac workup by Dr. Everardo All.   PLAN:  Will refill his Percocet today 7.5/500 mg one p.o. t.i.d.  His  Topamax and MS Contin were refilled in February of 2006.  He does not need  refills on those today.  He has plenty of Lidoderm to last him one more  month.  Will see him back then in a month.  Will send a copy of  our note  over to Dr. Everardo All.      DMK/MedQ  D:  07/28/2004 11:51:54  T:  07/28/2004 12:27:59  Job #:  [id number]   cc:   Gregary Signs A. Everardo All, M.D. [facility]

## 2010-10-28 NOTE — Assessment & Plan Note (Signed)
Blake Summers is a 65 year old married gentleman who has been followed in  our pain and rehabilitative clinic predominantly for low back pain and  right knee pain.  He was last seen by me August 20, 2006.  He has been  stable on his current medications.  His average pain today is about a 4  on a scale of 10.  Throughout the week it is about a 6.  Pain is  described as stabbing, aching and intermittent depending on activity  level.  He is continuing to report good relief with current medications  that he is on.   MEDICATIONS:  Medications prescribed by this clinic include morphine  sulfate 15 mg twice a day, oxycodone, acetaminophen 7.5/500 3 times a  day, Topamax 50 mg b.i.d. and Lidoderm up to 3 times a day.   He is able to walk about 10 minutes at a time.  He is able to walk  without assistance.  He does occasionally use the cane when his knee is  bothering him more.  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  He is independent with his self-care for the most part.  Does  occasionally require assistance with dressing, household duties and  shopping.  No problems controlling bowel or bladder.  Admits to numbness  and tingling.  Trouble walking.  Denies depression, anxiety or suicidal  ideation.   Reports that his hemoglobin A1c was up to about 8.  This is not  confirmed by any laboratory report, this is per oral history.  Otherwise  he has been doing well.   PAST MEDICAL HISTORY:  He has had some itching and has recently seen a  dermatologist.   PAST SURGICAL HISTORY:  Unchanged.   FAMILY HISTORY:  Unchanged.   PHYSICAL EXAMINATION:  VITAL SIGNS:  Blood pressure 148/65, pulse 95,  respirations 18, 97% saturation on room air.  GENERAL:  He is a well-developed, well-nourished gentleman who appears  his staged age.  He is morbidly obese.  He is oriented x3.  His affect  is alert, cooperative, pleasant and bright.  Speech is clear.  Follows  commands without any  problems.  Does not appear in any distress.  No  pain behaviors appreciated.  He is able to stand after being seated.  Gait is slightly wide based due to his girth and slightly antalgic as he  walks.  EXTREMITIES:  Decreased weight bearing in the right lower extremity due  to knee pain.  Seated reflexes are diminished at the patella tendons  symmetrically as well as the Achilles tendons.  No abnormal tone is  appreciated.  Motor strength is 5/5 at hip flexors, knee extensors,  dorsi flexors and plantar flexors, symmetric.  No abnormal tone is  appreciated.   IMPRESSION:  1. Bilateral knee osteoarthritis, right greater than left.  2. Lumbar stenosis with limited ambulation capacity of approximately      10 minutes.  3. Lumbar stenosis.  4. Lumbar spondylosis.  5. History of exertional shortness of breath.  6. History of enlarged heart.  7. Recent kidney stones.   PLAN:  Will refill the following medications for Mr. Lemon:  1. MS Contin 15 mg 1 p.o. b.i.d. #60 no refills.  2. Percocet 7.5/500 1 p.o. t.i.d. p.r.n. back or knee pain, #90.  3. He also takes GlycoLax intermittently for constipation.  4. He states today that the Topamax which he recently restarted in the      last month  significantly has helped his pain.  He states he does      much better on the Topamax than off.  When he is off Topamax he can      walk less than 5 minutes.  With the Topamax he is able to walk and      stand up to 15 minutes.   He has displayed  no aberrant behavior.  Nursing staff will see him back  in a month for a refill of his medications.  I will see him back in two.  He is planning to possibly see Dr. Lequita Halt for a knee injection within  the upcoming month or two.           ______________________________  Brantley Stage, M.D.     DMK/MedQ  D:  09/17/2006 10:00:41  T:  09/17/2006 10:57:07  Job #:  [id number]

## 2010-10-28 NOTE — Assessment & Plan Note (Signed)
HISTORY OF PRESENT ILLNESS:  [name] is back in today for a re-check. He  has had most of his medications refilled recently. He has complaints of  right knee pain and low back pain. These have been fairly constant. The  average pain is about a 7 on a scale of 10. Sleep is fair. The pain is worse  with walking, bending, standing. Improves with rest, heat, ice, medications.  Describes the pain as intermittent stabbing and aching. He gets fair relief  with his medications. Currently he can walk about 10 minutes at a time. He  is able to climb stairs. He is able to drive. Occasionally he will use a  cane. He is employed as an Nature conservation officer, 40 hours a week. Denies  bladder or bowel control problems. Denies suicidal ideation. Denies  depression and anxiety.   PAST MEDICAL HISTORY/SOCIAL HISTORY/FAMILY HISTORY:  No changes since last  visit.   PHYSICAL EXAMINATION:  VITAL SIGNS:  Blood pressure 134/105. Initial repeat  pressure he had a diastolic of 88. Pulse 99, respiratory rate 20, 96%  saturated on room air.  GENERAL:  He is an obese male. Does not appear in any distress during our  interview.  NEUROLOGIC:  His is oriented x3. Affect is bright, alert, and pleasant.  Cooperative. He has an antalgic gain, decreased weight bearing on the right  lower extremity due to his right knee pain. Seated, reflexes are 1+ at the  knees and ankles, zero at the ankles. He has excellent strength in the lower  extremities. He has quite a bit of tenderness along the medial joint line of  his right knee. No obvious swelling. No instability noted.   IMPRESSION:  1.  Osteoarthritis of the right knee.  2.  Lumbago/lumbar spondylosis.  3.  Morbid obesity.  4.  Diabetes.  5.  Sleep apnea.  6.  Shortness of breath with exertion.   PLAN:  [name] does not need medications refilled. Currently he is doing  well on what he is on. He had some questions regarding his bowels today. We  spent some time  talking about that. He has been constipated. He will be  taking Dulcolax tablets every day for a week and then switch to every other  day the following week. He had some questions about his Zantac and taking  his other medications with the Zantac. We also discussed possibly following  up with an orthopedic surgeon and looking into the right knee issue a little  bit further, possibly getting some x-rays. At this point, he is reluctant to  pursue any of this and seems to be satisfied with his medical management at  this point. We will see him back in a month.      DMK/MedQ  D:  09/16/2004 11:39:30  T:  09/16/2004 22:50:40  Job #:  [id number]

## 2010-10-28 NOTE — Group Therapy Note (Signed)
MEDICAL RECORD NUMBER:  [id number].   Blake Summers is a pleasant 65 year old white male who was accompanied by his  wife today at his initial visit to the pain and rehabilitative clinic here.   He has an approximately two year history of severe back pain. He reports a  several year history of back pain prior to that. He is really unable to tell  me how long he had back pain. He also has some complaints of right knee  pain.   His back pain is worsened with prolonged standing, prolonged sitting, and  walking any significant distance. He has had a MRI back in 2002 which showed  essentially spondylotic changes. He has some bilateral neural foraminal  narrowing at L5-S1 and diffuse disk bulges without evidence of significant  stenosis.   This gentleman denies any problems controlling bowel or bladder. He has no  numbness, tingling, or weakness that he can tell. Denies any problems with  his gait or balance.   Functionally, he does require some assistance putting his compression hose  on as well as his shoes. His wife assists him with this. He is otherwise  independent with upper and lower extremity dressing. He is otherwise  independent with feeding and orofacial hygiene as well a showering and  toileting.   Emotionally, he reports he is overall in good spirits. He denies any kind of  harm to self or others. No suicidal ideation noted. He denies alcohol,  tobacco, or illicit drug use. He has been married to his wife for six year.  He works for [employer]. 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   His mother was killed in an auto accident. Father died age 62, myocardial  infarction. Two brothers also died of myocardial infarction and diabetes.   He denies any drug allergies.   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   PAST SURGICAL HISTORY:  Past surgical history is really unremarkable.   CURRENT MEDICATIONS:  Diltiazem, Glucophage, Ticlid, Zyloprim, morphine  sulfate 15 mg 1 p.o. b.i.d., Glyset, Xalatan, Alphagan, Novolog 55 units  q.a.m.-85 at lunch-100 q.h.s., Humulin 40 units, Allegra, Neurontin 800 one  p.o. t.i.d., Percocet 7.5/500 one p.o. q.4h., Zantac 150 mg 1 p.o. b.i.d.   PHYSICAL EXAMINATION:  Reveals an obese white gentleman in no apparent  distress. Alert, oriented, cooperative. Blood pressure 149/65, pulse 107,  respirations 22, 94% saturated on room air.   HEART:  Regular rhythm.  LUNGS:  Clear.  ABDOMEN:  Obese, benign.  EXTREMITIES:  Reveals some venous stasis changes in bilateral lower  extremities. He has normal tone. No tremors are noted. He has some medial  joint line tenderness on the right knee. No edema is noted.   He is able to get out of the chair. His gait is wide based but stable. I do  not appreciate any kind of foot drop. Seated, his reflexes are 2+ at the  knees, 0 at the ankles. Toes are downgoing. No clonus is noted. Reflexes in  the upper extremity are 2+ at the biceps, triceps, as well as brachial  radialis bilaterally. Denies any sensory changes in upper or lower  extremity. Romberg's test is negative. He did report with sensory testing he  had some decreased sensation below the right knee.   IMPRESSION:  1.  Lumbago.  2.  Right knee pain, right knee osteoarthritis.  3.  Lumbar spondylosis.  4.  On CPAP at night.  5.  Diabetes.  6.  Positive family history for heart disease.   PLAN:  Will refill Mr. Cline morphine sulfate. He takes 15 mg 1 p.o.  b.i.d. #60. Will start him on some Lidoderm patches 1 to 3 patches 12 hours  on/12 hours off #90. We discussed the possibility of getting him involved in  a general conditioning program. Would need to start that at a physical  therapy facility and  monitor his blood pressure pulse and do cardiac  precautions with him as we get him started. We have discussed other pain  medications, possibly weaning the Neurontin and trying him on Topamax.  Sometimes there is a little bit of decreased appetite with Topamax,  concordant with possible weight loss. We would need to wean down the  Neurontin prior to switching that. Given the fact that he is on CPAP and has  diminished respiratory drive, would really like to avoid narcotics at night  with him if we can, and we will discuss at our next visit possibility of  doing some facet injections or epidural with him to see if we can calm down  the low back for him. Will see Dr. Lucile Crater back in 1 month.     Blake Summers, M.D.   DMK/MedQ  D:  02/10/2004 18:03:38  T:  02/11/2004 13:31:31  Job #:  [id number]

## 2010-10-28 NOTE — Assessment & Plan Note (Signed)
MEDICAL RECORD NUMBER [id number]   Blake Summers is a 65 year old white married gentleman who is being seen in our  pain and rehabilitative clinic for chronic low-back pain and right knee  pain.   He is back in for a brief recheck. He is too early for a refill of his  medications.   He has been taking MS Contin 15 mg twice a day as well as Endocet 7.5 mg one  three times a day p.r.n., as well as Topamax and Lidoderm.   He is doing well on these medications. He tells me he gets good relief with  the medications at this time, average pain is about a 6 on a scale of 10.  Pain is aggravated by walking, bending, standing; improves with rest, heat,  ice, and medication. Pain is described as stabbing in the low back  intermittently and aching in the right knee.   He is set up to see Dr. Lequita Halt February 02, 2005 for evaluation of his right  knee.   He works full-time as an Nature conservation officer. He is independent with all of  his self care with the exception of occasionally needs help with dressing,  as well as shopping.   He is able to climb steps slowly and he is able to drive.   REVIEW OF SYSTEMS:  Positive for intermittent numbness/tingling, trouble  walking due to his right knee area. He has diabetes.   No change in his past medical history otherwise, social, or family history  since last visit.   PHYSICAL EXAMINATION:  Blood pressure 119/63, pulse 92, respirations 16, 97%  saturated on room air.   Blake Summers is a well-developed, obese white gentleman. He is oriented x3.  His affect is bright, alert, cooperative, pleasant. He is able to stand  after being seated independently. His gait is slightly wide-based,  nonantalgic today. He does have limitations of lumbar range of motion in all  planes.   Seated reflexes are 1+ at the knees, 0 at the ankles. He has full range of  motion at the right knee. No mediolateral instability noted, no AP  instability noted. He has tenderness along the  right medial joint line.  There is no swelling noted in the joint. No crepitus noted with flexion-  extension at the knee. He has 5/5 strength at hip flexors, knee extensors,  dorsiflexors, plantar flexors, EHL. No numbness with light touch noted.   IMPRESSION:  1.  Lumbago secondary to lumbar spondylosis.  2.  Osteoarthritis right knee. Will follow up with Dr. Lequita Halt as planned.  3.  Morbid obesity.  4.  Diabetes.  5.  Sleep apnea.  6.  Shortness of breath with exertion.   This gentleman is too early on his refills. He will come in next week for  pickup of his medications. Will write for the following:  1.  MS Contin 15 mg one p.o. b.i.d. #60.  2.  Endocet 7.5/500 one p.o. t.i.d. #90.  3.  Topamax 25 mg one p.o. t.i.d. #90.  4.  Lidoderm one to three patches 12 hours, 12 hours off, #90.   We will see him back in a month. At this point he has no problem with  constipation due to the narcotics, no problem with over-sedation as well.       DMK/MedQ  D:  01/18/2005 11:27:02  T:  01/18/2005 11:59:33  Job #:  [id number]   cc:   Ollen Gross, M.D.  [facility]  [address]  [address]  [address]  [place]  [place] [address]  Fax: [phone]

## 2010-10-28 NOTE — Assessment & Plan Note (Signed)
Mr. Terhune is a 65 year old gentleman who is being seen in our pain and  rehabilitative clinic for chronic low back pain and bilateral knee pain.  He  has been followed by Dr. Lequita Halt as well and has been getting intermittent  injections from him.   He is back in today for a refill of his medications.  He states that his  average pain is about a 7 on a scale of 10.  The main pain is in the low  back, worse with activities, and improves with rest.  Medication injection  relief from the meds is fair at this point.  The pain is described as  stabbing and aching.  He can walk about 10-15 minutes at a time.  He is  having difficulty negotiating community distances at this point.  He is able  to climb stairs, 1 at a time and slowly.  He is able to drive.  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  He does need some  assistance with lower-extremity dressing, household duties, and shopping.  He is independent with feeding, bathing, toileting, and meal prep.  Denies  suicidal ideation.  Denies depression anxiety.  Denies bowel or bladder  control problems.   He reports no new changes in his past medical, social, or family history  since our last visit.   PHYSICAL EXAMINATION:  VITAL SIGNS:  Blood pressure 141/80, pulse 101,  respirations 16, 94% saturated on room air.  GENERAL:  He is a morbidly obese white gentleman who appears his stated age.  He is oriented x 3.  His affect is bright and alert.  He is cooperative and  pleasant.  He transitions from sit to stand with a little difficulty.  Once  he is up, he has a stable gait.  Balance is good.  Tandem gait and Romberg's  tests are normal.  Reflexes are diminished at the patellar tendons and  Achilles tendons.  No abnormal tone is noted.  He has good strength at the  hip flexors, knee extensors, dorsiflexors, and plantar flexors.   IMPRESSION:  Bilateral knee osteoarthritis, lumbar stenosis, lumbar  spondylosis,  exertional shortness of breath, use of CPAP at night, history  of an enlarged heart followed by Dr. Shelle Iron, enlarged heart diagnosed with  echocardiogram, per wife, in our office today.   PLAN:  Consider a scooter for community distances.  Will continue current  medications.  We will refill the following for him:  Lidoderm 1-3 patches,  12 hours on, 12 hours off, #90, 4 refills; Topamax 50 mg, 1 p.o. b.i.d.,  #60, 3 refills; Endocet 7.5/500, 1 p.o. t.i.d., #90, no refills; and MS  Contin 15 mg, 1 p.o. b.i.d., #60, no refills.   He continues to do well on theses medications, has displayed no aberrant  behavior.  He does get significant relief with these medications to continue  working 40 hours a week and has had no problems with oversedation or  constipation.  We will see him back in a month.  May pursue attempting to  get him a scooter as well next month.           ______________________________  Brantley Stage, M.D.     DMK/MedQ  D:  11/22/2005 15:47:29  T:  11/22/2005 18:15:14  Job #:  [id number]

## 2010-10-28 NOTE — Assessment & Plan Note (Signed)
MEDICAL RECORD NUMBER:  [id number].   [name] is a 65 year old married white male who is a patient of Dr.  George Hugh who is being seen in our pain and rehabilitative clinic for  chronic low back pain and chronic right knee pain.   He is an [employer] fast [employer]er who works 40 hours a week. His average pain is  about a 7 on a scale of 10. He reports very good relief with his current  pain medicines which include Percocet three times a day, Topamax twice a  day, and MS Contin 15 mg twice a day.   His sleep is overall fair. He reports his pain as being fairly constant and  aching in the low back and right knee, especially worse with activity,  standing, walking. Improves with rest, heat, ice, and medication.   He is able to walk without assistance, occasionally uses a cane, is able to  climb stairs and drive. He is able to walk about 10 minutes at a time.   He needs some assistance with dressing and shopping, is otherwise  independent with feeding, bathing, toileting, meal prep and other household  duties.   Denies any bowel or bladder control problems. Denies suicidal ideation.  Denies depression or anxiety. He admits to some numbness and tingling in the  right upper extremity. 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   PAST MEDICAL HISTORY:  Unchanged since last visit. Positive for stroke, high  blood pressure, glaucoma, diabetes, and arthritis.   SOCIAL HISTORY:  Unchanged since last visit.   FAMILY HISTORY:  Unchanged since last visit.   PHYSICAL EXAMINATION:  VITAL SIGNS:  Blood pressure 140/50, pulse 114,  respirations 16, 95% saturated on room air.  GENERAL:  He is a morbid obesity white gentleman who does not appear in any  distress during our interview. He is oriented x3. He is oriented x3. His  affect is overall bright, alert, and cooperative.   Stable exam from seated position independently. His gait is somewhat wide  based. Shoulder  stride length bilaterally. Forward flexion is somewhat  limited for him and increases his low back pain. Extension and lateral  flexion do not bother him at all. He does have some limitation with full  range of motion due to his _______________.   Seated, reflexes are 1+ at the right knee, 0 at the left knee, 0 at  bilateral ankles. Tone is normal in the lower extremities, 5/5 strength at  hip flexors, knee extensors, dorsi flexors, plantar flexors, EHL.   Examination of the right knee reveals no crepitus, no swelling. He does have  some medial joint line tenderness. There is no medial, lateral or AP  instability noted.   IMPRESSION:  1.  Lumbago/lumbar spondylosis.  2.  Morbid obesity.  3.  Right knee osteoarthritis.  4.  Diabetes.  5.  Sleep apnea.  6.  Shortness of breath with exertion.  7.  History of right sided weakness, history of cerebral vascular accident      approximately one decade ago.   PLAN:  His Topamax and MS Contin were refilled on August 10, 2004. We will  refill his Percocet. Last refill was February 16. We will give him Percocet  7.5/500 one p.o. t.i.d. #90. Discussed side effects of the medication with  him today. He is experiencing some constipation. Advised Senokot or  Dulcolax. I wrote this down from them in his wife's notebook. We will see  him  back then in a month. I will encourage him to continue to watch his diet  and to walk. Pool program would be best place for him to try to maintain  some type of exercise; however, he has not had time for this. We will see  him back in a month.      DMK/MedQ  D:  08/19/2004 09:59:52  T:  08/19/2004 14:15:39  Job #:  [id number]

## 2010-11-04 ENCOUNTER — Encounter
Payer: Medicare Other | Attending: Physical Medicine and Rehabilitation | Admitting: Physical Medicine and Rehabilitation

## 2010-11-04 DIAGNOSIS — M545 Low back pain, unspecified: Secondary | ICD-10-CM | POA: Insufficient documentation

## 2010-11-04 DIAGNOSIS — I872 Venous insufficiency (chronic) (peripheral): Secondary | ICD-10-CM | POA: Insufficient documentation

## 2010-11-04 DIAGNOSIS — M1710 Unilateral primary osteoarthritis, unspecified knee: Secondary | ICD-10-CM

## 2010-11-04 DIAGNOSIS — M79609 Pain in unspecified limb: Secondary | ICD-10-CM

## 2010-11-04 DIAGNOSIS — G894 Chronic pain syndrome: Secondary | ICD-10-CM

## 2010-11-04 DIAGNOSIS — Z79899 Other long term (current) drug therapy: Secondary | ICD-10-CM | POA: Insufficient documentation

## 2010-11-04 DIAGNOSIS — G8929 Other chronic pain: Secondary | ICD-10-CM | POA: Insufficient documentation

## 2010-11-04 DIAGNOSIS — M25569 Pain in unspecified knee: Secondary | ICD-10-CM | POA: Insufficient documentation

## 2010-11-04 DIAGNOSIS — M7989 Other specified soft tissue disorders: Secondary | ICD-10-CM | POA: Insufficient documentation

## 2010-11-05 NOTE — Assessment & Plan Note (Signed)
[name] is a pleasant 65 year old gentleman who is accompanied by his wife today.  [facility] in [facility] for chronic pain complaints related to his low back as well as his right knee.  His average pain is about 8 on a scale of 10, described as sharp, stabbing, and aching, worse when he is up walking.  Sleep tends to be poor.  He has been using wheelchair for community distances, walker in the home.  Pain is definitely worse with walking and standing. Improves with rest, medication, and injections.  He gets fair relief with current meds.  FUNCTIONAL STATUS:  Very minimal ambulation capacity at this point.  He is able to climb one or two steps at a time.  He does drive.  He is independent with feeding and toileting.  Needs assistance with dressing and bathing.  Denies problems controlling bowel or bladder.  Admits to some numbness and trouble walking, dizziness occasionally.  Negative for depression, anxiety, or suicidal ideation.  Negative for weakness.  No problems otherwise with respect to review of systems.  Past medical, social, and family history are otherwise unchanged.  [facility] include MS Contin 50 mg twice a day, Percocet 7.5/500 three times a day, Topamax 50 mg twice a day, p.r.n. Lidoderm and stool softener, and sodium docusate.  PHYSICAL EXAMINATION:  VITAL SIGNS:  His blood pressure is 189/58, repeat 150/66, pulse 103, respirations 16-20, O2 sats 89% to 91%. GENERAL:  Blake Summers is a morbidly obese gentleman who does not appear in any distress. NEUROLOGIC:  He is oriented x3.  Speech is clear.  Affect is bright.  He is alert, cooperative, and pleasant.  Follows commands without difficulty.  Answers my questions appropriately.  Cranial nerves and coordination are intact.  Reflexes are diminished in the lower extremities.  Motor strength, however, in both upper and lower extremities  is in the 5/5 range without focal deficit.  There is no abnormal tone, clonus, or tremors.  He transitions from sitting to standing slowly.  He has difficulty weightbearing to the right lower extremity due to knee pain.  Sensation is intact in both lower extremities.  He has edema of both lower extremities.  He has tenderness along the both medial and lateral joint lines of the right knee.  Radiographs done on Oct 12, 2010, show basically bone-on-bone joint space narrowing in the medial compartment of the right knee.  He has advanced tricompartmental degenerative disease, worse in the medial compartment. He had lumbar radiographs done, which showed multilevel degenerative disease and likely not obstructing left renal stones.  The patient reports no problems with his back other than when he is up walking.  IMPRESSION: 1. Severe right knee osteoarthritis, tricompartmental disease, medial     compartment most severe. 2. Chronic low back pain.  Radiographs suggest lumbar spondylosis     without instability. 3. Chronic lower extremity swelling secondary to chronic venous     insufficiency.  PLAN:  Radiograph showed evidence of probable nonobstructing left renal stones.  Mr. Weigelt does not have back pain while he is sitting or lying down.  His pain is worse when he is up walking and standing.  I doubt that these are contributing to his pain at this point.  However, we will continue to monitor.  I will refill his medications today.  His MS Contin 50 mg twice a day #60 as well as Percocet 7.5/500 one p.o. t.i.d., #90,  no refills on either.  I have encouraged him to maintain contact with his primary care doctor, Dr. Everardo All.  Apparently, he mentioned that there is a stress test planned.  Once his cardiac workup is complete, we will consider having him engaged in pool therapy again. I have also encouraged watching his diet.  His wife tells me "eating is his only joy."  We have also discussed  treatment options for his knee. We talked about custom bracing; however, he does have significant medial compartment osteoarthritis.  His lateral compartment is involved and painful as well.  I doubt custom bracing is going to give him much relief.  Await the results of his stress test.  I would like to get him back into some kind of therapy to work on lower extremity strengthening and transfers; however, we will hold off on this for now.  Mr. Pacella and his wife are comfortable with our current plan.  He has been taking his medications as prescribed without evidence of any aberrant behaviors.  His pill counts are appropriate.  Urine drug screen had been consistent.     Brantley Stage, M.D.    DMK/MedQ D:  11/04/2010 14:15:04  T:  11/05/2010 04:30:37  Job #:  [id number]

## 2010-11-11 ENCOUNTER — Other Ambulatory Visit: Payer: Self-pay | Admitting: Endocrinology

## 2010-11-14 ENCOUNTER — Other Ambulatory Visit: Payer: Self-pay | Admitting: Endocrinology

## 2010-11-29 ENCOUNTER — Encounter: Payer: Medicare Other | Attending: Physical Medicine and Rehabilitation

## 2010-11-29 DIAGNOSIS — M545 Low back pain, unspecified: Secondary | ICD-10-CM | POA: Insufficient documentation

## 2010-11-29 DIAGNOSIS — M47814 Spondylosis without myelopathy or radiculopathy, thoracic region: Secondary | ICD-10-CM

## 2010-11-29 DIAGNOSIS — R209 Unspecified disturbances of skin sensation: Secondary | ICD-10-CM | POA: Insufficient documentation

## 2010-11-29 DIAGNOSIS — R42 Dizziness and giddiness: Secondary | ICD-10-CM | POA: Insufficient documentation

## 2010-11-29 DIAGNOSIS — M79609 Pain in unspecified limb: Secondary | ICD-10-CM

## 2010-11-29 DIAGNOSIS — I872 Venous insufficiency (chronic) (peripheral): Secondary | ICD-10-CM | POA: Insufficient documentation

## 2010-11-29 DIAGNOSIS — M25569 Pain in unspecified knee: Secondary | ICD-10-CM | POA: Insufficient documentation

## 2010-11-29 DIAGNOSIS — M1710 Unilateral primary osteoarthritis, unspecified knee: Secondary | ICD-10-CM

## 2010-11-29 DIAGNOSIS — M7989 Other specified soft tissue disorders: Secondary | ICD-10-CM | POA: Insufficient documentation

## 2010-12-12 ENCOUNTER — Other Ambulatory Visit: Payer: Self-pay | Admitting: Endocrinology

## 2010-12-23 ENCOUNTER — Ambulatory Visit: Payer: Medicare Other | Admitting: Physical Medicine and Rehabilitation

## 2010-12-26 ENCOUNTER — Encounter: Payer: Medicare Other | Attending: Neurosurgery | Admitting: Neurosurgery

## 2010-12-26 DIAGNOSIS — M25569 Pain in unspecified knee: Secondary | ICD-10-CM | POA: Insufficient documentation

## 2010-12-26 DIAGNOSIS — M1710 Unilateral primary osteoarthritis, unspecified knee: Secondary | ICD-10-CM | POA: Insufficient documentation

## 2010-12-26 DIAGNOSIS — M545 Low back pain, unspecified: Secondary | ICD-10-CM | POA: Insufficient documentation

## 2010-12-26 DIAGNOSIS — G8929 Other chronic pain: Secondary | ICD-10-CM | POA: Insufficient documentation

## 2010-12-26 DIAGNOSIS — M7989 Other specified soft tissue disorders: Secondary | ICD-10-CM | POA: Insufficient documentation

## 2010-12-26 DIAGNOSIS — I872 Venous insufficiency (chronic) (peripheral): Secondary | ICD-10-CM | POA: Insufficient documentation

## 2010-12-27 NOTE — Assessment & Plan Note (Signed)
This patient is patient of Dr. Pamelia Hoit, is seen for his low back and right knee pain.  He rates his pain today without change in status at about an 8, it is sharp pain, it has been aching.  His activity level is 8-9.  His pain is worse during the day with walking, standing, rest, and medication; injections tend to help.  Today, he is using a rolling walker.  He drives.  He does not climb steps.  He does not work.  He does need some assistance with his ADLs.  REVIEW OF SYSTEMS:  Notable for those difficulties as above as well as trouble with ambulation, dizziness, tingling.  No suicidal thoughts or aberrant behaviors.  His Oswestry score is 76.  PAST MEDICAL HISTORY:  Unchanged.  SOCIAL HISTORY:  He lives with his wife.  FAMILY HISTORY:  Unchanged.  PHYSICAL EXAMINATION:  VITAL SIGNS:  His blood pressure is 149/67, pulse 95, respiration 20, O2 sats 90 on room air. CONSTITUTIONAL:  He is morbidly obese.  He does walk with a rolling walker with brakes.  He is alert and oriented x3.  His affect is bright and alert.  There has been no changes in his status. MUSCULOSKELETAL:  His motor strength is fairly good and his lower extremity is sensation is intact.  ASSESSMENT: 1. Right knee osteoarthritis, severe. 2. Chronic low back pain. 3. Chronic pain and some swelling due to venous insufficiency in lower     extremities.  PLAN: 1. We will refill his MS Contin 15 mg 1 p.o. b.i.d., 60 with no     refill. 2. Percocet 7.5/500 one p.o. t.i.d., 90 with no refill. 3. He already has an appointment scheduled with Dr. Pamelia Hoit, he     will follow up in 1 month.  His questions were encouraged and     answered.     Ennifer Harston L. Blima Dessert Electronically Signed    RLW/MedQ D:  12/26/2010 15:25:11  T:  12/27/2010 02:15:04  Job #:  [id number]

## 2010-12-28 ENCOUNTER — Ambulatory Visit: Payer: Medicare Other | Admitting: Neurosurgery

## 2011-01-12 ENCOUNTER — Other Ambulatory Visit: Payer: Self-pay | Admitting: Endocrinology

## 2011-01-25 ENCOUNTER — Encounter
Payer: Medicare Other | Attending: Physical Medicine and Rehabilitation | Admitting: Physical Medicine and Rehabilitation

## 2011-01-25 DIAGNOSIS — M1710 Unilateral primary osteoarthritis, unspecified knee: Secondary | ICD-10-CM | POA: Insufficient documentation

## 2011-01-25 DIAGNOSIS — M545 Low back pain, unspecified: Secondary | ICD-10-CM

## 2011-01-25 DIAGNOSIS — M25569 Pain in unspecified knee: Secondary | ICD-10-CM | POA: Insufficient documentation

## 2011-01-25 DIAGNOSIS — M7989 Other specified soft tissue disorders: Secondary | ICD-10-CM | POA: Insufficient documentation

## 2011-01-25 DIAGNOSIS — M47817 Spondylosis without myelopathy or radiculopathy, lumbosacral region: Secondary | ICD-10-CM | POA: Insufficient documentation

## 2011-01-25 DIAGNOSIS — I872 Venous insufficiency (chronic) (peripheral): Secondary | ICD-10-CM | POA: Insufficient documentation

## 2011-01-25 DIAGNOSIS — M79609 Pain in unspecified limb: Secondary | ICD-10-CM

## 2011-01-25 DIAGNOSIS — G894 Chronic pain syndrome: Secondary | ICD-10-CM

## 2011-01-26 NOTE — Assessment & Plan Note (Signed)
[name] is a pleasant 65 year old gentleman accompanied by his wife again today.  With his permission, she stays in the room for history, physical, as well as treatment plan.  [facility] for chronic pain complaints related predominantly to his low back and his right knee.  He also has chronic lower extremity swelling and has chronic venous insufficiency with occasional open areas in the left lower leg.  He is followed by Dr. Everardo All for his other medical problems and diabetes.  His average pain is between 7 and 8 on a scale of 10.  Sleep tends to be poor.  Pain is worse with walking and standing, improves with rest and heat, ice, medication, injections.  He gets fairly good relief with current meds.  Medications prescribed through [facility] include: 1. MS Contin 15 mg twice a day. 2. Percocet 7.5/500 t.i.d. 3. He also uses Topamax 50 mg twice a day. 4. P.r.n. Lidoderm. 5. Stool softener.  FUNCTIONAL STATUS:  He can walk 4-5 minutes at a time.  He is able to climb stairs not more than 5 or so.  He does drive.  He is independent with feeding and toileting, needs assistance with dressing and bathing.  Denies problems controlling bowel or bladder.  Admits to some numbness and trouble walking as well as occasional dizziness.  Denies depression, anxiety, suicidal ideation.  No changes were noted regarding review of systems.  Past medical, social, and family history are also unchanged.  PHYSICAL EXAMINATION:  VITAL SIGNS:  Blood pressure is 131/45, pulse 89, respirations 20, 90% saturated on room air. GENERAL : He is a well-developed, morbidly obese gentleman who does not appear in any distress.  He is oriented x3.  Speech is clear.  Affect is bright.  He is alert, cooperative, and pleasant.  Follows commands without difficulty, answers my questions appropriately. NEUROLOGIC:  Cranial nerves and coordination are  intact.  His reflexes are diminished in both upper and lower extremities.  No abnormal tone, clonus, or tremors are noted. MUSCULOSKELETAL:  Sensation is intact.  Motor strength is quite good with the exception of hip extensors.  He has some difficulty exiting a chair.  He has compression stockings on both lower extremities and edema is not significant at this time in the lower legs.  He has joint line tenderness especially in the right knee, effusion is not obvious today.  He transitions from sitting to standing slowly.  He has a wide-based gait which is antalgic.  He has limited lumbar motion in all planes.  IMPRESSION: 1. Severe right knee tricompartmental osteoarthritis. 2. Chronic low back pain with history of lumbar spondylosis without     instability. 3. Chronic lower extremity swelling secondary to chronic venous     insufficiency.  Currently, appears to be controlled with     compression stockings.  PLAN:  At our last visit, we had a discussion considering some physical therapy again to work on sit to stand transfers to improve his hip extensor strength, however, would like to hold off until he has his cardiac stress test.  They told me that this was planned back in May and we have waited a couple of months to get this completed, however, it is not completed yet apparently, it is pending.  We will hold off on some physical therapy until this is completed.  Patient and his wife are in agreement.  I have refilled his Percocet 7.5/500 one p.o. t.i.d. #  90 as well as his MS Contin 15 mg one p.o. b.i.d. #60.  He does not need any refills on his Topamax or Lidoderm at this time.  I have answered all of his questions and comfortable with our plan.  We will see him back in 6 weeks.  He has been taking his medications as prescribed.  No evidence of aberrant behavior.  His last urine drug screen was Oct 13, 2010, and was consistent.     Brantley Stage, M.D. Electronically  Signed    DMK/MedQ D:  01/25/2011 14:47:02  T:  01/26/2011 00:27:17  Job #:  [id number]

## 2011-02-02 ENCOUNTER — Ambulatory Visit (INDEPENDENT_AMBULATORY_CARE_PROVIDER_SITE_OTHER): Payer: Medicare Other | Admitting: Endocrinology

## 2011-02-02 ENCOUNTER — Other Ambulatory Visit (INDEPENDENT_AMBULATORY_CARE_PROVIDER_SITE_OTHER): Payer: Medicare Other

## 2011-02-02 ENCOUNTER — Encounter: Payer: Self-pay | Admitting: Endocrinology

## 2011-02-02 VITALS — BP 138/64 | HR 111 | Temp 98.2°F | Ht 64.0 in | Wt 356.1 lb

## 2011-02-02 DIAGNOSIS — R9431 Abnormal electrocardiogram [ECG] [EKG]: Secondary | ICD-10-CM | POA: Insufficient documentation

## 2011-02-02 DIAGNOSIS — E119 Type 2 diabetes mellitus without complications: Secondary | ICD-10-CM

## 2011-02-02 DIAGNOSIS — R0602 Shortness of breath: Secondary | ICD-10-CM

## 2011-02-02 NOTE — Patient Instructions (Addendum)
check your blood sugar 2 times a day.  vary the time of day when you check, between before the 3 meals, and at bedtime.  also check if you have symptoms of your blood sugar being too high or too low.  please keep a record of the readings and bring it to your next appointment here.  please call us sooner if you are having low blood sugar episodes. blood tests are being ordered for you today.  please call [phone] to hear each of your test results.  You will be prompted to enter the 9-digit "MRN" number that appears at the top left of this page, followed by #.  Then you will hear the message. pending the test results, please: Decrease nph insulin to 110 units. Increase humalog to 3x a day (just before each meal) 80-120-150 units. Please make a follow-up appointment in 3 months.

## 2011-02-02 NOTE — Progress Notes (Signed)
Subjective:    Patient ID: Blake Summers, male    DOB: May 17, 1946, 65 y.o.   MRN: [id number]  HPI Pt says he feel well, except for low-back pain, which is unchanged.  no cbg record, but states cbg's are sometimes mildly low (50's) in am.  He is uncertain what time of day cbg is highest. Past Medical History  Diagnosis Date  . GERD 07/06/2010  . Edema 10/28/2007  . DIABETES MELLITUS, TYPE II 05/04/2007  . HYPERLIPIDEMIA 07/08/2008  . ALLERGIC RHINITIS 07/08/2008  . GLAUCOMA 07/08/2008  . CEREBROVASCULAR ACCIDENT, HX OF 07/08/2008  . OBSTRUCTIVE SLEEP APNEA 03/06/2008  . DIABETIC ULCER, LEFT LEG 08/23/2009  . 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 HYPERTENSION 10/28/2007  . VENOUS INSUFFICIENCY 03/08/2009  . FATTY LIVER DISEASE 05/19/2008  . BENIGN PROSTATIC HYPERTROPHY 07/08/2008  . 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 NEPHROLITHIASIS, HX OF 07/08/2008  . DEGENERATIVE JOINT DISEASE 05/24/2009  . Morbid obesity   . DM nephropathy/sclerosis     No past surgical history on file.  History   Social History  . Marital Status: Married    Spouse Name: N/A    Number of Children: N/A  . Years of Education: N/A   Occupational History  . Retired     Nature conservation officer   Social History Main Topics  . Smoking status: Former Games developer  . Smokeless tobacco: Not on file  . Alcohol Use: No  . Drug Use: No  . Sexually Active:    Other Topics Concern  . Not on file   Social History Narrative   Diet is "good"Exercise is limited by medical problems    Current Outpatient Prescriptions on File Prior to Visit  Medication Sig Dispense Refill  . AGGRENOX 25-200 MG per 12 hr capsule TAKE 1 CAPSULE BY MOUTH 2 TIMES         DAILY  60 capsule  5  . allopurinol (ZYLOPRIM) 300 MG tablet TAKE ONE (1) TABLET BY MOUTH EVERY      DAY  30 tablet  5  . CLARITIN-D 12 HOUR 5-120 MG per tablet TAKE ONE (1) TABLET BY MOUTH TWO (2)    TIMES DAILY AS NEEDED  60 tablet  5  . clobetasol (TEMOVATE) 0.05 % ointment APPLY  TO AFFECTED AREA 3 TIMES DAILY    AS NEEDED ITCHING  60 g  4  . clomiPHENE (CLOMID) 50 MG tablet Take 50 mg by mouth daily.        Marland Kitchen diltiazem (CARDIZEM CD) 180 MG 24 hr capsule TAKE 2 CAPSULES (360MG ) BY MOUTH DAILY  60 capsule  8  . furosemide (LASIX) 80 MG tablet TAKE 1 TABLET BY MOUTH EVERY DAY  30 tablet  4  . glucose blood (ONE TOUCH TEST STRIPS) test strip Check blood sugar qid       . insulin lispro (HUMALOG KWIKPEN) 100 UNIT/ML injection 3x a day (just before each meal)  80-120-150-20 units      . Insulin Pen Needle (NOVOFINE) 30G X 8 MM MISC Use four times a day dx 250.00  100 each  5  . KLOR-CON M20 20 MEQ tablet TAKE ONE (1) TABLET BY MOUTH TWO (2)    TIMES DAILY  60 tablet  8  . lidocaine (LIDODERM) 5 % Place 1 patch onto the skin daily. Remove & Discard patch within 12 hours or as directed by MD       . losartan-hydrochlorothiazide (HYZAAR) 100-25 MG per tablet TAKE ONE (1) TABLET BY MOUTH EVERY DAY  FOR BLOOD PRESSURE  30 tablet  8  . lovastatin (MEVACOR) 40 MG tablet TAKE 2 TABLETS BY MOUTH EVERY NIGHT     AT BEDTIME  60 tablet  4  . metFORMIN (GLUCOPHAGE-XR) 500 MG 24 hr tablet TAKE 2 TABLETS BY MOUTH                 2 TIMES DAILY  120 tablet  5  . methocarbamol (ROBAXIN) 500 MG tablet 1 tablet at bedtime as needed for cramps       . morphine (MS CONTIN) 15 MG 12 hr tablet Take 15 mg by mouth 2 (two) times daily.        . naproxen sodium (ANAPROX) 220 MG tablet 2 tablets by mouth once daily as needed for pain       . oxyCODONE-acetaminophen (PERCOCET) 7.5-500 MG per tablet 1 tablet by mouth three times a day as needed       . polyethylene glycol powder (GLYCOLAX/MIRALAX) powder MIX AND TAKE 17 GRAMS DAILY AS DIRECTED  527 g  5  . ranitidine (ZANTAC) 150 MG tablet 1 tablet two times a day as needed for heartburn       . topiramate (TOPAMAX) 50 MG tablet Take 50 mg by mouth 2 (two) times daily.        Marland Kitchen zolpidem (AMBIEN) 10 MG tablet Take 10 mg by mouth at bedtime as needed.          Marland Kitchen albuterol (PROVENTIL HFA) 108 (90 BASE) MCG/ACT inhaler Inhale 1 puff into the lungs every 4 (four) hours as needed.          Allergies  Allergen Reactions  . Neosporin (Neomycin-Polymyx-Gramicid)     Family History  Problem Relation Age of Onset  . Cancer Brother     Colon Cancer  . Heart disease Brother   . Heart disease Brother     BP 138/64  Pulse 111  Temp(Src) 98.2 F (36.8 C) (Oral)  Ht 5\' 4"  (1.626 m)  Wt 356 lb 1.9 oz (161.535 kg)  BMI 61.13 kg/m2  SpO2 88%  Review of Systems denies loc    Objective:   Physical Exam GENERAL: no distress. Obese Pulses: dorsalis pedis intact bilat.   Feet: no deformity.  no ulcer on the feet.  feet are of normal color and temp.  1+ bilat leg edema.  There are bilat varicosities.  There is bilat rust colored discoloration.   Left leg: wrapped (sees [facility] wound care). Neuro: sensation is intact to touch on the feet, but decreased from normal.  Lab Results  Component Value Date   HGBA1C 7.4* 02/02/2011      Assessment & Plan:  Dm, he needs some adjustment in his therapy.

## 2011-02-08 ENCOUNTER — Other Ambulatory Visit (HOSPITAL_COMMUNITY): Payer: Medicare Other | Admitting: Radiology

## 2011-02-08 ENCOUNTER — Telehealth: Payer: Self-pay

## 2011-02-08 ENCOUNTER — Ambulatory Visit (HOSPITAL_COMMUNITY): Payer: Medicare Other | Admitting: Radiology

## 2011-02-08 DIAGNOSIS — R9431 Abnormal electrocardiogram [ECG] [EKG]: Secondary | ICD-10-CM

## 2011-02-08 DIAGNOSIS — R0602 Shortness of breath: Secondary | ICD-10-CM

## 2011-02-08 NOTE — Telephone Encounter (Signed)
i ordered

## 2011-02-08 NOTE — Telephone Encounter (Signed)
New order is need for a stress test to be done at hospital. Clinic was unable to perform test due to the patient's size.

## 2011-02-09 ENCOUNTER — Telehealth: Payer: Self-pay

## 2011-02-09 ENCOUNTER — Ambulatory Visit (HOSPITAL_COMMUNITY): Payer: Medicare Other | Attending: Endocrinology

## 2011-02-09 ENCOUNTER — Other Ambulatory Visit (HOSPITAL_COMMUNITY): Payer: Self-pay | Admitting: Cardiology

## 2011-02-09 ENCOUNTER — Other Ambulatory Visit (HOSPITAL_COMMUNITY): Payer: Medicare Other | Admitting: Radiology

## 2011-02-09 DIAGNOSIS — E119 Type 2 diabetes mellitus without complications: Secondary | ICD-10-CM | POA: Insufficient documentation

## 2011-02-09 DIAGNOSIS — R0609 Other forms of dyspnea: Secondary | ICD-10-CM | POA: Insufficient documentation

## 2011-02-09 DIAGNOSIS — I10 Essential (primary) hypertension: Secondary | ICD-10-CM | POA: Insufficient documentation

## 2011-02-09 DIAGNOSIS — R9431 Abnormal electrocardiogram [ECG] [EKG]: Secondary | ICD-10-CM

## 2011-02-09 DIAGNOSIS — I079 Rheumatic tricuspid valve disease, unspecified: Secondary | ICD-10-CM | POA: Insufficient documentation

## 2011-02-09 DIAGNOSIS — M7989 Other specified soft tissue disorders: Secondary | ICD-10-CM | POA: Insufficient documentation

## 2011-02-09 DIAGNOSIS — R0602 Shortness of breath: Secondary | ICD-10-CM

## 2011-02-09 DIAGNOSIS — R0989 Other specified symptoms and signs involving the circulatory and respiratory systems: Secondary | ICD-10-CM | POA: Insufficient documentation

## 2011-02-09 MED ORDER — PERFLUTREN PROTEIN A MICROSPH IV SUSP
3.0000 mL | Freq: Once | INTRAVENOUS | Status: AC
Start: 2011-02-09 — End: 2011-02-09
  Administered 2011-02-09: 3 mL via INTRAVENOUS

## 2011-02-09 NOTE — Telephone Encounter (Signed)
Per Burna Mortimer order needed to be re done.

## 2011-02-09 NOTE — Telephone Encounter (Signed)
Burna Mortimer advised and will re-schedule procedure and inform pt.

## 2011-02-27 ENCOUNTER — Telehealth: Payer: Self-pay | Admitting: *Deleted

## 2011-02-27 DIAGNOSIS — R0602 Shortness of breath: Secondary | ICD-10-CM

## 2011-02-27 DIAGNOSIS — R9431 Abnormal electrocardiogram [ECG] [EKG]: Secondary | ICD-10-CM

## 2011-02-27 NOTE — Telephone Encounter (Signed)
please call patient: i have ref to cardiol

## 2011-02-27 NOTE — Telephone Encounter (Signed)
Per [name] at [facility], pt's order for Blake Summers has to be canceled because a cardiologist must place the order for the [facility]. Pt had appt scheduled for tomorrow.

## 2011-02-27 NOTE — Telephone Encounter (Signed)
Pt informed of referral to cardiology.  

## 2011-02-28 ENCOUNTER — Other Ambulatory Visit (HOSPITAL_COMMUNITY): Payer: Medicare Other

## 2011-03-01 ENCOUNTER — Other Ambulatory Visit (HOSPITAL_COMMUNITY): Payer: Medicare Other

## 2011-03-06 ENCOUNTER — Encounter
Payer: Medicare Other | Attending: Physical Medicine and Rehabilitation | Admitting: Physical Medicine and Rehabilitation

## 2011-03-06 DIAGNOSIS — M545 Low back pain, unspecified: Secondary | ICD-10-CM | POA: Insufficient documentation

## 2011-03-06 DIAGNOSIS — G894 Chronic pain syndrome: Secondary | ICD-10-CM

## 2011-03-06 DIAGNOSIS — M1710 Unilateral primary osteoarthritis, unspecified knee: Secondary | ICD-10-CM

## 2011-03-06 DIAGNOSIS — M79609 Pain in unspecified limb: Secondary | ICD-10-CM

## 2011-03-06 DIAGNOSIS — M47817 Spondylosis without myelopathy or radiculopathy, lumbosacral region: Secondary | ICD-10-CM | POA: Insufficient documentation

## 2011-03-06 DIAGNOSIS — M7989 Other specified soft tissue disorders: Secondary | ICD-10-CM | POA: Insufficient documentation

## 2011-03-06 DIAGNOSIS — M25569 Pain in unspecified knee: Secondary | ICD-10-CM | POA: Insufficient documentation

## 2011-03-06 DIAGNOSIS — I872 Venous insufficiency (chronic) (peripheral): Secondary | ICD-10-CM | POA: Insufficient documentation

## 2011-03-06 NOTE — Assessment & Plan Note (Signed)
HISTORY OF PRESENT ILLNESS:  [name] is a pleasant 65 year old morbidly obese gentleman who is being seen at our [facility] today for refill of his pain medications.  Mr. Doolen has a long history of chronic right knee pain and is known to have right knee tricompartmental osteoarthritis.  He is followed by Dr. Lequita Halt for this and has had multiple knee injections in the past without improvement in his pain.  He has a history of chronic low back pain, lumbar spondylosis without instability, and also chronic lower extremity swelling secondary to venous insufficiency.  Mr. Collister states that he had been set up to undergo some stress testing, however, this apparently was precluded due to his size and he was eventually referred to Cardiology, Dr. Shirlee Latch on March 14, 2011. He has not had his stress test yet or workup for cardiac problems.  His average pain in the low back, knee, and lower extremities is between 7-8 on a scale of 10.  Sleep tends to be poor.  Pain is worse when he is up walking or standing, improves with rest, medication, heat, or ice.  FUNCTIONAL STATUS:  He can walk with a cane or walker.  He can climb about 4-5 steps at a time.  He does drive.  He is independent with self- care but needs some assistance with lower extremity dressing and bathing.  No new problems with respect to bowel or bladder.  Denies depression, anxiety, or suicidal ideation.  He does occasionally get some numbness and has difficulty walking.  REVIEW OF SYSTEMS:  Otherwise unchanged.  PAST MEDICAL/SOCIAL/FAMILY HISTORY:  Unchanged other than that already noted.  PHYSICAL EXAMINATION:  VITAL SIGNS:  Blood pressure is 107/38, pulse 96, respirations 18, and 90% saturated on room air. GENERAL:  He is a morbidly obese gentleman who does not appear in any distress.  He is oriented x3. NEUROLOGIC:  Speech is clear.  Affect is bright.  He is alert, cooperative, and  pleasant.  Follows commands without difficulty. Answers my questions appropriately.  Cranial nerves to coordination are intact.  Reflexes are diminished in both lower extremities.  His strength, however, is quite good 5/5 at hip flexors, knee extensors, dorsiflexors, plantar flexors as well as knee flexors.  No new sensory deficits are appreciated.  He can transition from sitting to standing slowly.  His gait is wide based with a short stride length.  He has limited lumbar mobility in all planes.  IMPRESSION: 1. Severe right knee tricompartmental osteoarthritis. 2. Chronic low back pain with history of lumbar spondylosis without     instability. 3. Chronic lower extremity swelling secondary to chronic venous     insufficiency.  PLAN:  I refilled his pain medications, Percocet 7.5/500 t.i.d., MS Contin 15 mg 1 p.o. b.i.d., Topamax 50 mg one p.o. b.i.d., docusate sodium 100 mg daily to b.i.d.  His last urine drug screen was Oct 13, 2010 and was consistent.  His pill counts are appropriate today.  He does not display any aberrant behavior and he reports fair to good relief with current pain medications.  I have answered all his questions.  He is comfortable with our management plan at this time.     Brantley Stage, M.D. Electronically Signed    DMK/MedQ D:  03/06/2011 13:27:34  T:  03/06/2011 18:48:43  Job #:  [id number]

## 2011-03-13 ENCOUNTER — Other Ambulatory Visit: Payer: Self-pay | Admitting: Endocrinology

## 2011-03-16 ENCOUNTER — Ambulatory Visit (INDEPENDENT_AMBULATORY_CARE_PROVIDER_SITE_OTHER): Payer: Medicare Other | Admitting: Cardiology

## 2011-03-16 ENCOUNTER — Encounter: Payer: Self-pay | Admitting: Cardiology

## 2011-03-16 VITALS — BP 124/74 | HR 86 | Ht 66.0 in | Wt 352.0 lb

## 2011-03-16 DIAGNOSIS — R0602 Shortness of breath: Secondary | ICD-10-CM

## 2011-03-16 DIAGNOSIS — R0609 Other forms of dyspnea: Secondary | ICD-10-CM

## 2011-03-16 DIAGNOSIS — I10 Essential (primary) hypertension: Secondary | ICD-10-CM

## 2011-03-16 DIAGNOSIS — E785 Hyperlipidemia, unspecified: Secondary | ICD-10-CM

## 2011-03-16 DIAGNOSIS — R0989 Other specified symptoms and signs involving the circulatory and respiratory systems: Secondary | ICD-10-CM

## 2011-03-16 DIAGNOSIS — E119 Type 2 diabetes mellitus without complications: Secondary | ICD-10-CM

## 2011-03-16 NOTE — Patient Instructions (Signed)
Your physician recommends that you return for a FASTING lipid profile/liver profile/BMP/BNP 786.09   Your physician has requested that you have a lexiscan myoview. For further information please visit [email or web address] Please follow instruction sheet, as given.  Your physician recommends that you schedule a follow-up appointment in: 2 weeks with Dr Shirlee Latch.

## 2011-03-17 ENCOUNTER — Other Ambulatory Visit: Payer: Self-pay

## 2011-03-17 DIAGNOSIS — Z8679 Personal history of other diseases of the circulatory system: Secondary | ICD-10-CM

## 2011-03-17 DIAGNOSIS — R0989 Other specified symptoms and signs involving the circulatory and respiratory systems: Secondary | ICD-10-CM

## 2011-03-18 NOTE — Assessment & Plan Note (Signed)
Will check lipids, goal LDL < 70 given CVA history.

## 2011-03-18 NOTE — Assessment & Plan Note (Signed)
Patient has profound exertional dyspnea.  This certainly could be due to morbid obesity: BMI is 56.  However, he has numerous cardiac risk factors: hyperlipidemia, HTN, diabetes, family history of premature CAD, prior CVA.  His PMR doctor wants him to start no physical therapy but is concerned about the exertional dyspnea.  His knees prevent him from walking on the treadmill.  He does not have good windows for a dobutamine echo.  His chest is not particularly thick compared to his abdomen, so a Lexiscan myoview may be feasible.  He is too large for our office nuclear camera so I will arrange a 2-day protocol at the hospital.  He will need to hold his Aggrenox prior to the [facility].  Given the exertional dyspnea, I will also check a BNP (useful if high, will not tell us much if it is normal because given his size, could certainly have a false negative).

## 2011-03-18 NOTE — Progress Notes (Signed)
PCP: Dr. Everardo All  65 yo with history of morbid obesity, HTN, diabetes, and severe knee osteoarthritis presents for cardiology evaluation.  Patient's main complaint is profound exertional dyspnea.  He is very short of breath just walking from one room to the other in his house.  This has been worse for 6-8 months though he has had some dyspnea for a long time.  No orthopnea but he sleeps with CPAP.  No chest pain.  He is very limited by knee osteoarthritis.  He uses a wheelchair because of knee pain and dyspnea when he leaves the house.  He has chronic lower extremity edema and has his lower legs wrapped today.  He has been seeing a rehab doctor who wants him to do physical therapy for his knee.  He has been told that he is not an operative candidate, mainly because of his size.  Because of the severe dyspnea with any exertion, his rehab doctor had wanted him to get a stress test.    Labs (1/12): LDL 51, HDL 30, K 3.9, creatinine 1.1, TSH normal  ECG: NSR, poor anterior R wave progression  PMH: 1. Morbid obesity 2. HTN 3. Hyperlipidemia 4. GERD 5. CVA: Distant past 6. OSA: Uses CPAP 7. Erectile dysfunction 8. Hypogonadism 9. Low back pain 10. Nephrolithiasis 11. Diabetic nephropathy 12. Knee osteoarthritis: Severe, has been told he is not a candidate for surgery.  13. Chronic venous insufficiency: Lower legs wrapped.  14. Type II diabetes 15. Echo (8/12): Technically difficult echo, grossly normal EF.   SH: Lives in [place].  Quit smoking > 25 years ago.  No ETOH.  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   FH: Mother died in car accident.  Father with MI in his 63s.  9 siblings: All have had MIs or have pacemakers.   ROS: All systems reviewed and negative except as per HPI.   Current Outpatient Prescriptions  Medication Sig Dispense Refill  . AGGRENOX 25-200 MG per 12 hr capsule TAKE 1 CAPSULE BY MOUTH 2 TIMES         DAILY  60 capsule  5  . albuterol (PROVENTIL  HFA) 108 (90 BASE) MCG/ACT inhaler Inhale 1 puff into the lungs every 4 (four) hours as needed.        Marland Kitchen allopurinol (ZYLOPRIM) 300 MG tablet TAKE ONE (1) TABLET BY MOUTH EVERY      DAY  30 tablet  5  . brimonidine (ALPHAGAN) 0.15 % ophthalmic solution as directed.      Marland Kitchen CLARITIN-D 12 HOUR 5-120 MG per tablet TAKE ONE (1) TABLET BY MOUTH TWO (2)    TIMES DAILY AS NEEDED  60 tablet  5  . clobetasol (TEMOVATE) 0.05 % ointment APPLY TO AFFECTED AREA 3 TIMES DAILY    AS NEEDED ITCHING  60 g  4  . clomiPHENE (CLOMID) 50 MG tablet Take 50 mg by mouth daily.        Marland Kitchen diltiazem (CARDIZEM CD) 180 MG 24 hr capsule TAKE 2 CAPSULES (360MG ) BY MOUTH DAILY  60 capsule  8  . dorzolamide-timolol (COSOPT) 22.3-6.8 MG/ML ophthalmic solution as directed.      . furosemide (LASIX) 80 MG tablet TAKE 1 TABLET BY MOUTH EVERY DAY  30 tablet  4  . glucose blood (ONE TOUCH TEST STRIPS) test strip Check blood sugar qid       . insulin lispro (HUMALOG KWIKPEN) 100 UNIT/ML injection 3x a day (just before each meal)  80-120-150-20 units      .  insulin NPH (HUMULIN N PEN) 100 UNIT/ML injection        . Insulin Pen Needle (NOVOFINE) 30G X 8 MM MISC Use four times a day dx 250.00  100 each  5  . ketoconazole (NIZORAL) 2 % shampoo as directed.      Marland Kitchen KLOR-CON M20 20 MEQ tablet TAKE ONE (1) TABLET BY MOUTH TWO (2)    TIMES DAILY  60 tablet  8  . latanoprost (XALATAN) 0.005 % ophthalmic solution as directed.      . lidocaine (LIDODERM) 5 % Place 1 patch onto the skin daily. Remove & Discard patch within 12 hours or as directed by MD       . losartan-hydrochlorothiazide (HYZAAR) 100-25 MG per tablet TAKE ONE (1) TABLET BY MOUTH EVERY DAY  FOR BLOOD PRESSURE  30 tablet  8  . lovastatin (MEVACOR) 40 MG tablet TAKE 2 TABLETS BY MOUTH EVERY NIGHT     AT BEDTIME  60 tablet  4  . metFORMIN (GLUCOPHAGE-XR) 500 MG 24 hr tablet TAKE 2 TABLETS BY MOUTH                 2 TIMES DAILY  120 tablet  5  . methocarbamol (ROBAXIN) 500 MG tablet TAKE  ONE TABLET BY MOUTH EVERY NIGHT    AT BEDTIME AS NEEDED FOR CRAMPS  30 tablet  5  . morphine (MS CONTIN) 15 MG 12 hr tablet Take 15 mg by mouth 2 (two) times daily.        . naproxen sodium (ANAPROX) 220 MG tablet 2 tablets by mouth once daily as needed for pain       . oxyCODONE-acetaminophen (PERCOCET) 7.5-500 MG per tablet 1 tablet by mouth three times a day as needed       . polyethylene glycol powder (GLYCOLAX/MIRALAX) powder MIX AND TAKE 17 GRAMS DAILY AS DIRECTED  527 g  3  . ranitidine (ZANTAC) 150 MG tablet 1 tablet two times a day as needed for heartburn       . topiramate (TOPAMAX) 50 MG tablet Take 50 mg by mouth 2 (two) times daily.        Marland Kitchen zolpidem (AMBIEN) 10 MG tablet Take 10 mg by mouth at bedtime as needed.          BP 124/74  Pulse 86  Ht 5\' 6"  (1.676 m)  Wt 352 lb (159.666 kg)  BMI 56.81 kg/m2 General: NAD, morbidly obese Neck: Thick, No JVD, no thyromegaly or thyroid nodule.  Lungs: Clear to auscultation bilaterally with normal respiratory effort. CV: Nondisplaced PMI.  Heart regular S1/S2, no S3/S4, no murmur.  Lower legs are wrapped with 1+ chronic edema to the knees.  No carotid bruit.  Cannot feel pedal pulses due to significant edema.   Abdomen: Soft, nontender, no hepatosplenomegaly, no distention.  Skin: Intact without lesions or rashes.  Neurologic: Alert and oriented x 3.  Psych: Normal affect. Extremities: No clubbing or cyanosis.  HEENT: Normal.

## 2011-03-18 NOTE — Assessment & Plan Note (Signed)
BP seems to be under reasonable control.  

## 2011-03-23 ENCOUNTER — Other Ambulatory Visit (INDEPENDENT_AMBULATORY_CARE_PROVIDER_SITE_OTHER): Payer: Medicare Other | Admitting: *Deleted

## 2011-03-23 DIAGNOSIS — E119 Type 2 diabetes mellitus without complications: Secondary | ICD-10-CM

## 2011-03-23 DIAGNOSIS — R0989 Other specified symptoms and signs involving the circulatory and respiratory systems: Secondary | ICD-10-CM

## 2011-03-23 DIAGNOSIS — R0609 Other forms of dyspnea: Secondary | ICD-10-CM

## 2011-03-23 LAB — HEPATIC FUNCTION PANEL
AST: 28 U/L (ref 0–37)
Albumin: 3.4 g/dL — ABNORMAL LOW (ref 3.5–5.2)
Alkaline Phosphatase: 61 U/L (ref 39–117)
Bilirubin, Direct: 0.1 mg/dL (ref 0.0–0.3)
Total Bilirubin: 0.3 mg/dL (ref 0.3–1.2)

## 2011-03-23 LAB — BASIC METABOLIC PANEL
Calcium: 8.7 mg/dL (ref 8.4–10.5)
GFR: 109.02 mL/min (ref >60.00)
Glucose, Bld: 68 mg/dL — ABNORMAL LOW (ref 70–99)
Potassium: 3.8 mEq/L (ref 3.5–5.1)
Sodium: 142 mEq/L (ref 135–145)

## 2011-03-23 LAB — BRAIN NATRIURETIC PEPTIDE: Pro B Natriuretic peptide (BNP): 29 pg/mL (ref 0.0–100.0)

## 2011-03-28 ENCOUNTER — Encounter: Payer: Self-pay | Admitting: *Deleted

## 2011-03-29 ENCOUNTER — Encounter (HOSPITAL_COMMUNITY)
Admission: RE | Admit: 2011-03-29 | Discharge: 2011-03-29 | Disposition: A | Discharge status: Home / Self Care | Payer: Medicare Other | Source: Ambulatory Visit | Attending: Cardiology | Admitting: Cardiology

## 2011-03-29 DIAGNOSIS — R0989 Other specified symptoms and signs involving the circulatory and respiratory systems: Secondary | ICD-10-CM | POA: Insufficient documentation

## 2011-03-29 DIAGNOSIS — R0609 Other forms of dyspnea: Secondary | ICD-10-CM | POA: Insufficient documentation

## 2011-03-29 DIAGNOSIS — R0602 Shortness of breath: Secondary | ICD-10-CM

## 2011-03-30 ENCOUNTER — Ambulatory Visit (HOSPITAL_COMMUNITY): Payer: Medicare Other

## 2011-03-30 ENCOUNTER — Encounter: Payer: Self-pay | Admitting: *Deleted

## 2011-03-30 ENCOUNTER — Encounter (HOSPITAL_COMMUNITY)
Admission: RE | Admit: 2011-03-30 | Discharge: 2011-03-30 | Disposition: A | Discharge status: Home / Self Care | Payer: Medicare Other | Source: Ambulatory Visit | Attending: Cardiology | Admitting: Cardiology

## 2011-03-30 ENCOUNTER — Telehealth: Payer: Self-pay | Admitting: *Deleted

## 2011-03-30 DIAGNOSIS — R9430 Abnormal result of cardiovascular function study, unspecified: Secondary | ICD-10-CM

## 2011-03-30 DIAGNOSIS — R0989 Other specified symptoms and signs involving the circulatory and respiratory systems: Secondary | ICD-10-CM | POA: Insufficient documentation

## 2011-03-30 DIAGNOSIS — R0602 Shortness of breath: Secondary | ICD-10-CM

## 2011-03-30 DIAGNOSIS — R0609 Other forms of dyspnea: Secondary | ICD-10-CM | POA: Insufficient documentation

## 2011-03-30 MED ORDER — TECHNETIUM TC 99M TETROFOSMIN IV KIT
30.0000 | PACK | Freq: Once | INTRAVENOUS | Status: AC | PRN
  Administered 2011-03-29: 30 via INTRAVENOUS

## 2011-03-30 MED ORDER — TECHNETIUM TC 99M TETROFOSMIN IV KIT
30.0000 | PACK | Freq: Once | INTRAVENOUS | Status: AC | PRN
  Administered 2011-03-30: 30 via INTRAVENOUS

## 2011-03-30 NOTE — Telephone Encounter (Signed)
Dr Shirlee Latch reviewed lexiscan images and confirmed reversible ischemia. Pt has an appt 04/06/11. Dr Shirlee Latch will discuss heart cath at that time unless pt has had an increase in symptoms. LMTCB for pt to discuss with him.

## 2011-03-30 NOTE — Telephone Encounter (Signed)
I received a called report from Erskine Squibb in radiology nuclear medicine at [facility] [phone]. She reported  the lexiscan myoview showed a reversible defect in the anterior septal wall. Dr Shirlee Latch is aware and will review the myoview images.

## 2011-03-30 NOTE — Telephone Encounter (Signed)
Dr Shirlee Latch talked with pt. [facility] scheduled for 04/06/11 at 11:30 JV instead of appt with Dr Shirlee Latch 04/06/11. I have discussed this with the pt.

## 2011-03-30 NOTE — Telephone Encounter (Signed)
Pt returning call to Anne. Please call back.  

## 2011-04-03 ENCOUNTER — Encounter: Payer: Medicare Other | Attending: Neurosurgery | Admitting: Neurosurgery

## 2011-04-03 ENCOUNTER — Other Ambulatory Visit (INDEPENDENT_AMBULATORY_CARE_PROVIDER_SITE_OTHER): Payer: Medicare Other

## 2011-04-03 ENCOUNTER — Other Ambulatory Visit: Payer: Medicare Other

## 2011-04-03 DIAGNOSIS — G894 Chronic pain syndrome: Secondary | ICD-10-CM

## 2011-04-03 DIAGNOSIS — M545 Low back pain, unspecified: Secondary | ICD-10-CM | POA: Insufficient documentation

## 2011-04-03 DIAGNOSIS — R9430 Abnormal result of cardiovascular function study, unspecified: Secondary | ICD-10-CM

## 2011-04-03 DIAGNOSIS — G8929 Other chronic pain: Secondary | ICD-10-CM | POA: Insufficient documentation

## 2011-04-03 DIAGNOSIS — M47817 Spondylosis without myelopathy or radiculopathy, lumbosacral region: Secondary | ICD-10-CM | POA: Insufficient documentation

## 2011-04-03 DIAGNOSIS — I872 Venous insufficiency (chronic) (peripheral): Secondary | ICD-10-CM | POA: Insufficient documentation

## 2011-04-03 DIAGNOSIS — R0602 Shortness of breath: Secondary | ICD-10-CM

## 2011-04-03 DIAGNOSIS — M25569 Pain in unspecified knee: Secondary | ICD-10-CM | POA: Insufficient documentation

## 2011-04-03 LAB — BASIC METABOLIC PANEL
CO2: 33 mEq/L — ABNORMAL HIGH (ref 19–32)
GFR: 85.29 mL/min (ref >60.00)
Glucose, Bld: 127 mg/dL — ABNORMAL HIGH (ref 70–99)
Potassium: 3.6 mEq/L (ref 3.5–5.1)
Sodium: 140 mEq/L (ref 135–145)

## 2011-04-03 LAB — CBC WITH DIFFERENTIAL/PLATELET
Basophils Absolute: 0.1 10*3/uL (ref 0.0–0.1)
HCT: 41.1 % (ref 39.0–52.0)
Hemoglobin: 13.6 g/dL (ref 13.0–17.0)
Lymphs Abs: 1.4 10*3/uL (ref 0.7–4.0)
MCHC: 33.1 g/dL (ref 30.0–36.0)
Monocytes Relative: 5.3 % (ref 3.0–12.0)
Neutro Abs: 9.3 10*3/uL — ABNORMAL HIGH (ref 1.4–7.7)
RDW: 15.2 % — ABNORMAL HIGH (ref 11.5–14.6)

## 2011-04-04 NOTE — Assessment & Plan Note (Signed)
This is a patient of Dr. Leretha Dykes who is seen for morbid obesity, pain in his back, and right knee pain.  The patient states that he has to be seen at [facility] through his cardiologist in a week or so to have an angiogram done due to some previous testing was done.  Other than that, he reports no pain or s changes.  He rates pain as 8.  It is a sharp, stabbing, aching pain.  General activity level is 8-9.  Sleep patterns are poor.  Pain is worse walking and standing.  Rest, heat, medication, and injections help.  He does walk with assistance with a rolling walker or a cane occasionally.  He climb steps and drive. Functionally, he is retired.  He needs help with dressing, bathing, ADLs, meal prep, and shopping.  REVIEW OF SYSTEMS:  Notable for difficulties described above as well as some paresthesias and trouble with ambulation.  No suicidal thoughts or aberrant behaviors.  Pill counts are correct.  Last UDS was consistent. He is due for UDS at the next appointment.  Otherwise, past medical history/social history/family history unchanged.  PHYSICAL EXAM:  Blood pressure 155/65, pulse 101, respirations 20, O2 sats 98 on room air.  Motor strength is 5/5 in lower extremities. Sensation is intact.  Constitutionally, he is morbidly obese.  He is alert and oriented x3.  He has a fairly normal gait.  He does have somewhat of a side-to-side gait because of his obesity, using his walker.  IMPRESSION: 1. History of severe right knee osteoarthritis. 2. Chronic low back pain, spondylosis. 3. He has a history of chronic venous insufficiency. 4. Pending cardiac angiogram and/or workup.  PLAN: 1. Refill Percocet 7.5/500 one p.o. t.i.d., #90 with no refill. 2. MS Contin 15 mg 1 p.o. b.i.d., #60 with no refill.  His questions     were encouraged and answered.  I will see him in a month.     Shetara Launer L. Blima Dessert Electronically Signed    RLW/MedQ D:  04/03/2011 13:54:11  T:   04/04/2011 16:10:96  Job #:  [id number]

## 2011-04-06 ENCOUNTER — Inpatient Hospital Stay (HOSPITAL_BASED_OUTPATIENT_CLINIC_OR_DEPARTMENT_OTHER)
Admission: RE | Admit: 2011-04-06 | Discharge: 2011-04-06 | Disposition: A | Discharge status: Home / Self Care | Payer: Medicare Other | Source: Ambulatory Visit | Attending: Cardiology | Admitting: Cardiology

## 2011-04-06 ENCOUNTER — Ambulatory Visit: Payer: Medicare Other | Admitting: Cardiology

## 2011-04-06 DIAGNOSIS — R0609 Other forms of dyspnea: Secondary | ICD-10-CM | POA: Insufficient documentation

## 2011-04-06 DIAGNOSIS — R5381 Other malaise: Secondary | ICD-10-CM | POA: Insufficient documentation

## 2011-04-06 DIAGNOSIS — I2510 Atherosclerotic heart disease of native coronary artery without angina pectoris: Secondary | ICD-10-CM | POA: Insufficient documentation

## 2011-04-06 DIAGNOSIS — R0602 Shortness of breath: Secondary | ICD-10-CM

## 2011-04-06 DIAGNOSIS — R0989 Other specified symptoms and signs involving the circulatory and respiratory systems: Secondary | ICD-10-CM | POA: Insufficient documentation

## 2011-04-10 LAB — POCT I-STAT GLUCOSE: Operator id: 221371

## 2011-04-12 ENCOUNTER — Other Ambulatory Visit: Payer: Self-pay | Admitting: Endocrinology

## 2011-04-12 ENCOUNTER — Encounter: Payer: Self-pay | Admitting: *Deleted

## 2011-04-15 NOTE — Cardiovascular Report (Signed)
  NAMEKRISTINE, TILEY NO.:  [id number]  MEDICAL RECORD NO.:  [id number]  LOCATION:  PRM                          FACILITY:  [facility]  PHYSICIAN:  Marca Ancona, MD      DATE OF BIRTH:  06-Apr-1946  DATE OF PROCEDURE:  04/06/2011 DATE OF DISCHARGE:                           CARDIAC CATHETERIZATION   PROCEDURE:  Coronary angiography.  INDICATION:  This is a 65 year old with morbid obesity who has very severe exertional dyspnea.  His rehab doctor wants to do physical therapy.  However, because of his severe shortness of breath, he wanted him to cleared with the stress test prior to initiation of physical therapy.  The patient had a 2 day Myoview which showed a reversible anterior defect.  This may be soft tissue attenuation, however, given the patient's significant symptoms, I cannot rule out obstructive coronary disease.  PROCEDURE NOTE:  After informed consent was obtained, the patient underwent Allen testing on his right wrist.  The right ulnar artery gave good collateral circulation of the radial side of the hand constituting a positive Allen's test.  The right radial artery was then engaged using modified Seldinger technique and a 5-French arterial sheath was placed. The patient received 3 mg intra-arterial verapamil and 6500 units of IV heparin.  The right coronary artery was engaged using the JR4 catheter. The left coronary artery was engaged using the JL 3.5 catheter.  We did not do left ventriculography as the patient developed some vasospasm in his radial artery.  We decided to terminate the procedure after coronary angiography.  FINDINGS: 1. Right coronary artery.  The right coronary artery was a dominant     vessel.  There were luminal irregularities. 2. Left main.  The left main had no angiographic disease. 3. Left circumflex system.  There was a small ramus, large first     obtuse marginal with about 25% ostial stenosis and a small second  obtuse marginal.  The AV circumflex itself had minimal disease. 4. LAD system.  The LAD had no significant coronary disease. 5. Left ventriculography.  Left ventriculogram was not done.  The     patient had a recent Myoview with normal EF and he did develop some     radial artery spasm.  Therefore we terminated the procedure prior     to a left ventriculogram.  IMPRESSION:  The patient has minimal coronary disease.  His dyspnea is likely coming from morbid obesity and deconditioning.  We will plan on medical management of his risk factors.     Marca Ancona, MD     DM/MEDQ  D:  04/06/2011  T:  04/06/2011  Job:  [id number]  cc:   Gregary Signs A. Everardo All, MD  Electronically Signed by Marca Ancona MD on 04/15/2011 11:31:36 PM

## 2011-04-26 ENCOUNTER — Encounter: Payer: Self-pay | Admitting: Endocrinology

## 2011-04-26 ENCOUNTER — Ambulatory Visit (INDEPENDENT_AMBULATORY_CARE_PROVIDER_SITE_OTHER): Payer: Medicare Other | Admitting: Endocrinology

## 2011-04-26 ENCOUNTER — Other Ambulatory Visit (INDEPENDENT_AMBULATORY_CARE_PROVIDER_SITE_OTHER): Payer: Medicare Other

## 2011-04-26 ENCOUNTER — Encounter: Payer: Self-pay | Admitting: Cardiology

## 2011-04-26 ENCOUNTER — Ambulatory Visit (INDEPENDENT_AMBULATORY_CARE_PROVIDER_SITE_OTHER): Payer: Medicare Other | Admitting: Cardiology

## 2011-04-26 VITALS — BP 128/58 | HR 90 | Ht 67.0 in | Wt 356.0 lb

## 2011-04-26 VITALS — BP 134/60 | HR 100 | Temp 97.8°F | Wt 357.0 lb

## 2011-04-26 DIAGNOSIS — E119 Type 2 diabetes mellitus without complications: Secondary | ICD-10-CM

## 2011-04-26 DIAGNOSIS — R0602 Shortness of breath: Secondary | ICD-10-CM

## 2011-04-26 DIAGNOSIS — Z23 Encounter for immunization: Secondary | ICD-10-CM

## 2011-04-26 NOTE — Patient Instructions (Signed)
Your physician recommends that you schedule a follow-up appointment as needed with Dr McLean.  

## 2011-04-26 NOTE — Progress Notes (Signed)
Subjective:    Patient ID: Blake Summers, male    DOB: Jun 04, 1946, 65 y.o.   MRN: [id number]  HPI pt states he feels better in general.  he brings a record of his cbg's which i have reviewed today.  It varies from 81-347.  However, most are in the 100's.  It is highest before lunch, when lunch is delayed.  He seldom checks before lunch.  It is over 200 at hs, unless he takes supper insulin, but does not eat. Past Medical History  Diagnosis Date  . GERD 07/06/2010  . Edema 10/28/2007  . DIABETES MELLITUS, TYPE II 05/04/2007  . HYPERLIPIDEMIA 07/08/2008  . ALLERGIC RHINITIS 07/08/2008  . GLAUCOMA 07/08/2008  . CEREBROVASCULAR ACCIDENT, HX OF 07/08/2008  . OBSTRUCTIVE SLEEP APNEA 03/06/2008  . DIABETIC ULCER, LEFT LEG 08/23/2009  . 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 HYPERTENSION 10/28/2007  . VENOUS INSUFFICIENCY 03/08/2009  . FATTY LIVER DISEASE 05/19/2008  . BENIGN PROSTATIC HYPERTROPHY 07/08/2008  . 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 NEPHROLITHIASIS, HX OF 07/08/2008  . DEGENERATIVE JOINT DISEASE 05/24/2009  . Morbid obesity   . DM nephropathy/sclerosis     No past surgical history on file.  History   Social History  . Marital Status: Married    Spouse Name: N/A    Number of Children: N/A  . Years of Education: N/A   Occupational History  . Retired     Nature conservation officer   Social History Main Topics  . Smoking status: Former Games developer  . Smokeless tobacco: Not on file  . Alcohol Use: No  . Drug Use: No  . Sexually Active:    Other Topics Concern  . Not on file   Social History Narrative   Diet is "good"Exercise is limited by medical problems    Current Outpatient Prescriptions on File Prior to Visit  Medication Sig Dispense Refill  . AGGRENOX 25-200 MG per 12 hr capsule TAKE 1 CAPSULE BY MOUTH 2 TIMES         DAILY  60 capsule  5  . albuterol (PROVENTIL HFA) 108 (90 BASE) MCG/ACT inhaler Inhale 1 puff into the lungs every 4 (four) hours as needed.        Marland Kitchen allopurinol  (ZYLOPRIM) 300 MG tablet TAKE 1 TABLET BY MOUTH EVERY DAY  30 tablet  5  . brimonidine (ALPHAGAN) 0.15 % ophthalmic solution as directed.      Marland Kitchen CLARITIN-D 12 HOUR 5-120 MG per tablet TAKE ONE (1) TABLET BY MOUTH TWO (2)    TIMES DAILY AS NEEDED  60 tablet  5  . clobetasol (TEMOVATE) 0.05 % ointment APPLY TO AFFECTED AREA 3 TIMES DAILY    AS NEEDED ITCHING  60 g  4  . clomiPHENE (CLOMID) 50 MG tablet Take 50 mg by mouth daily.        Marland Kitchen diltiazem (CARDIZEM CD) 180 MG 24 hr capsule TAKE 2 CAPSULES (360MG ) BY MOUTH DAILY  60 capsule  8  . dorzolamide-timolol (COSOPT) 22.3-6.8 MG/ML ophthalmic solution as directed.      . furosemide (LASIX) 80 MG tablet TAKE 1 TABLET BY MOUTH EVERY DAY  30 tablet  4  . glucose blood (ONE TOUCH TEST STRIPS) test strip Check blood sugar qid       . insulin lispro (HUMALOG KWIKPEN) 100 UNIT/ML injection 3x a day (just before each meal)  90-120-160-20 units      . insulin NPH (HUMULIN N PEN) 100 UNIT/ML injection Inject 100 Units into the skin at  bedtime.       . Insulin Pen Needle (NOVOFINE) 30G X 8 MM MISC Use four times a day dx 250.00  100 each  5  . ketoconazole (NIZORAL) 2 % shampoo as directed.      Marland Kitchen KLOR-CON M20 20 MEQ tablet TAKE ONE (1) TABLET BY MOUTH TWO (2)    TIMES DAILY  60 tablet  8  . latanoprost (XALATAN) 0.005 % ophthalmic solution as directed.      . lidocaine (LIDODERM) 5 % Place 1 patch onto the skin daily. Remove & Discard patch within 12 hours or as directed by MD       . losartan-hydrochlorothiazide (HYZAAR) 100-25 MG per tablet TAKE ONE (1) TABLET BY MOUTH EVERY DAY  FOR BLOOD PRESSURE  30 tablet  8  . lovastatin (MEVACOR) 40 MG tablet TAKE 2 TABLETS BY MOUTH EVERY NIGHT     AT BEDTIME  60 tablet  4  . metFORMIN (GLUCOPHAGE-XR) 500 MG 24 hr tablet TAKE 2 TABLETS BY MOUTH                 2 TIMES DAILY  120 tablet  5  . methocarbamol (ROBAXIN) 500 MG tablet TAKE ONE TABLET BY MOUTH EVERY NIGHT    AT BEDTIME AS NEEDED FOR CRAMPS  30 tablet  5  .  morphine (MS CONTIN) 15 MG 12 hr tablet Take 15 mg by mouth 2 (two) times daily.        . naproxen sodium (ANAPROX) 220 MG tablet 2 tablets by mouth once daily as needed for pain       . oxyCODONE-acetaminophen (PERCOCET) 7.5-500 MG per tablet 1 tablet by mouth three times a day as needed       . polyethylene glycol powder (GLYCOLAX/MIRALAX) powder MIX AND TAKE 17 GRAMS DAILY AS DIRECTED  527 g  3  . ranitidine (ZANTAC) 150 MG tablet 1 tablet two times a day as needed for heartburn       . topiramate (TOPAMAX) 50 MG tablet Take 50 mg by mouth 2 (two) times daily.        Marland Kitchen zolpidem (AMBIEN) 10 MG tablet Take 10 mg by mouth at bedtime as needed.          Allergies  Allergen Reactions  . Neosporin (Neomycin-Polymyx-Gramicid)     Family History  Problem Relation Age of Onset  . Cancer Brother     Colon Cancer  . Heart disease Brother   . Heart disease Brother    BP 134/60  Pulse 100  Temp(Src) 97.8 F (36.6 C) (Oral)  Wt 357 lb (161.934 kg)  SpO2 88%  Review of Systems denies hypoglycemia.      Objective:   Physical Exam VITAL SIGNS:  See vs page GENERAL: no distress.  Obese. SKIN:  Insulin injection sites at the anterior abdomen are normal      Assessment & Plan:  DM, characterized by severe insulin resistance.  he needs increased rx

## 2011-04-26 NOTE — Patient Instructions (Addendum)
check your blood sugar 2 times a day.  vary the time of day when you check, between before the 3 meals, and at bedtime.  also check if you have symptoms of your blood sugar being too high or too low.  please keep a record of the readings and bring it to your next appointment here.  please call us sooner if you are having low blood sugar episodes. blood tests are being ordered for you today.  please call [phone] to hear each of your test results.  You will be prompted to enter the 9-digit "MRN" number that appears at the top left of this page, followed by #.  Then you will hear the message. pending the test results, please: Decrease nph insulin to 110 units. Increase humalog to 3x a day (just before each meal) 90-120-160 units.  You should take this insulin just before you eat, and only when you eat.   Please make a "medicare wellness" appointment after feb 14.

## 2011-04-27 NOTE — Assessment & Plan Note (Signed)
False positive myoview with no significant CAD on left heart cath.  I suspect that his dyspnea is primarily due to obesity and deconditioning.  No cardiac contraindication to aggressive rehab.

## 2011-04-27 NOTE — Progress Notes (Signed)
PCP: Dr. Everardo All  65 yo with history of morbid obesity, HTN, diabetes, and severe knee osteoarthritis returns for cardiology followup.  Patient's main complaint has been profound exertional dyspnea.  He is very short of breath just walking from one room to the other in his house.  This has been worse for 6-8 months though he has had some dyspnea for a long time.  No orthopnea but he sleeps with CPAP.  No chest pain.  He is very limited by knee osteoarthritis.  He uses a wheelchair because of knee pain and dyspnea when he leaves the house.  He has chronic lower extremity edema.  He has been seeing a rehab doctor who wants him to do physical therapy for his knee.  He has been told that he is not an operative candidate, mainly because of his size.  Because of the severe dyspnea with any exertion, his rehab doctor had wanted him to get a stress test.    I had him do a [employer], which showed a reversible anteroseptal perfusion defect.  I then did a left heart cath in 10/12.  This showed only very mild coronary disease (nothing obstructive).   Labs (1/12): LDL 51, HDL 30, K 3.9, creatinine 1.1, TSH normal Labs (10/12): BNP 29, K 3.8, creatinine 0.8, LDL 30, HDL 37  PMH: 1. Morbid obesity 2. HTN 3. Hyperlipidemia 4. GERD 5. CVA: Distant past 6. OSA: Uses CPAP 7. Erectile dysfunction 8. Hypogonadism 9. Low back pain 10. Nephrolithiasis 11. Diabetic nephropathy 12. Knee osteoarthritis: Severe, has been told he is not a candidate for surgery.  13. Chronic venous insufficiency: Lower legs wrapped.  14. Type II diabetes 15. Exertional dyspnea.  Echo (8/12): Technically difficult echo, grossly normal EF.  Lexiscan myoview (10/12) showed anteroseptal reversible perfusion defect.  Left heart cath (10/12) showed 25% ostial OM1 stenosis, otherwise minimal disease.   SH: Lives in [place].  Quit smoking > 25 years ago.  No ETOH.  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   FH:  Mother died in car accident.  Father with MI in his 30s.  9 siblings: All have had MIs or have pacemakers.    Current Outpatient Prescriptions  Medication Sig Dispense Refill  . AGGRENOX 25-200 MG per 12 hr capsule TAKE 1 CAPSULE BY MOUTH 2 TIMES         DAILY  60 capsule  5  . albuterol (PROVENTIL HFA) 108 (90 BASE) MCG/ACT inhaler Inhale 1 puff into the lungs every 4 (four) hours as needed.        Marland Kitchen allopurinol (ZYLOPRIM) 300 MG tablet TAKE 1 TABLET BY MOUTH EVERY DAY  30 tablet  5  . brimonidine (ALPHAGAN) 0.15 % ophthalmic solution as directed.      Marland Kitchen CLARITIN-D 12 HOUR 5-120 MG per tablet TAKE ONE (1) TABLET BY MOUTH TWO (2)    TIMES DAILY AS NEEDED  60 tablet  5  . clobetasol (TEMOVATE) 0.05 % ointment APPLY TO AFFECTED AREA 3 TIMES DAILY    AS NEEDED ITCHING  60 g  4  . clomiPHENE (CLOMID) 50 MG tablet Take 50 mg by mouth daily.        Marland Kitchen diltiazem (CARDIZEM CD) 180 MG 24 hr capsule TAKE 2 CAPSULES (360MG ) BY MOUTH DAILY  60 capsule  8  . dorzolamide-timolol (COSOPT) 22.3-6.8 MG/ML ophthalmic solution as directed.      . furosemide (LASIX) 80 MG tablet TAKE 1 TABLET BY MOUTH EVERY DAY  30 tablet  4  . glucose blood (ONE TOUCH TEST STRIPS) test strip Check blood sugar qid       . insulin lispro (HUMALOG KWIKPEN) 100 UNIT/ML injection 3x a day (just before each meal)  90-120-160-20 units      . insulin NPH (HUMULIN N PEN) 100 UNIT/ML injection Inject 100 Units into the skin at bedtime.       . Insulin Pen Needle (NOVOFINE) 30G X 8 MM MISC Use four times a day dx 250.00  100 each  5  . ketoconazole (NIZORAL) 2 % shampoo as directed.      Marland Kitchen KLOR-CON M20 20 MEQ tablet TAKE ONE (1) TABLET BY MOUTH TWO (2)    TIMES DAILY  60 tablet  8  . latanoprost (XALATAN) 0.005 % ophthalmic solution as directed.      . lidocaine (LIDODERM) 5 % Place 1 patch onto the skin daily. Remove & Discard patch within 12 hours or as directed by MD       . losartan-hydrochlorothiazide (HYZAAR) 100-25 MG per tablet TAKE  ONE (1) TABLET BY MOUTH EVERY DAY  FOR BLOOD PRESSURE  30 tablet  8  . lovastatin (MEVACOR) 40 MG tablet TAKE 2 TABLETS BY MOUTH EVERY NIGHT     AT BEDTIME  60 tablet  4  . metFORMIN (GLUCOPHAGE-XR) 500 MG 24 hr tablet TAKE 2 TABLETS BY MOUTH                 2 TIMES DAILY  120 tablet  5  . methocarbamol (ROBAXIN) 500 MG tablet TAKE ONE TABLET BY MOUTH EVERY NIGHT    AT BEDTIME AS NEEDED FOR CRAMPS  30 tablet  5  . morphine (MS CONTIN) 15 MG 12 hr tablet Take 15 mg by mouth 2 (two) times daily.        . naproxen sodium (ANAPROX) 220 MG tablet 2 tablets by mouth once daily as needed for pain       . oxyCODONE-acetaminophen (PERCOCET) 7.5-500 MG per tablet 1 tablet by mouth three times a day as needed       . polyethylene glycol powder (GLYCOLAX/MIRALAX) powder MIX AND TAKE 17 GRAMS DAILY AS DIRECTED  527 g  3  . ranitidine (ZANTAC) 150 MG tablet 1 tablet two times a day as needed for heartburn       . topiramate (TOPAMAX) 50 MG tablet Take 50 mg by mouth 2 (two) times daily.        Marland Kitchen zolpidem (AMBIEN) 10 MG tablet Take 10 mg by mouth at bedtime as needed.          BP 128/58  Pulse 90  Ht 5\' 7"  (1.702 m)  Wt 161.481 kg (356 lb)  BMI 55.76 kg/m2  SpO2 92% General: NAD, morbidly obese Neck: Thick, No JVD, no thyromegaly or thyroid nodule.  Lungs: Clear to auscultation bilaterally with normal respiratory effort. CV: Nondisplaced PMI.  Heart regular S1/S2, no S3/S4, no murmur.  Lower legs are wrapped with 1+ chronic edema to the knees.  No carotid bruit.  Cannot feel pedal pulses due to significant edema.   Abdomen: Soft, nontender, no hepatosplenomegaly, no distention.  Neurologic: Alert and oriented x 3.  Psych: Normal affect. Extremities: No clubbing or cyanosis.

## 2011-05-03 ENCOUNTER — Ambulatory Visit: Payer: Medicare Other | Admitting: Physical Medicine and Rehabilitation

## 2011-05-03 ENCOUNTER — Ambulatory Visit: Payer: Medicare Other | Admitting: Neurosurgery

## 2011-05-03 ENCOUNTER — Encounter: Payer: Medicare Other | Attending: Neurosurgery | Admitting: Neurosurgery

## 2011-05-03 DIAGNOSIS — R279 Unspecified lack of coordination: Secondary | ICD-10-CM | POA: Insufficient documentation

## 2011-05-03 DIAGNOSIS — I872 Venous insufficiency (chronic) (peripheral): Secondary | ICD-10-CM | POA: Insufficient documentation

## 2011-05-03 DIAGNOSIS — M47817 Spondylosis without myelopathy or radiculopathy, lumbosacral region: Secondary | ICD-10-CM | POA: Insufficient documentation

## 2011-05-03 DIAGNOSIS — R209 Unspecified disturbances of skin sensation: Secondary | ICD-10-CM | POA: Insufficient documentation

## 2011-05-03 DIAGNOSIS — M1710 Unilateral primary osteoarthritis, unspecified knee: Secondary | ICD-10-CM

## 2011-05-03 DIAGNOSIS — R42 Dizziness and giddiness: Secondary | ICD-10-CM | POA: Insufficient documentation

## 2011-05-03 DIAGNOSIS — M25569 Pain in unspecified knee: Secondary | ICD-10-CM | POA: Insufficient documentation

## 2011-05-03 DIAGNOSIS — M545 Low back pain, unspecified: Secondary | ICD-10-CM | POA: Insufficient documentation

## 2011-05-03 DIAGNOSIS — R062 Wheezing: Secondary | ICD-10-CM | POA: Insufficient documentation

## 2011-05-03 DIAGNOSIS — M549 Dorsalgia, unspecified: Secondary | ICD-10-CM | POA: Insufficient documentation

## 2011-05-03 NOTE — Assessment & Plan Note (Signed)
This patient of Dr. Leretha Dykes seen for morbid obesity, which renders pain in his back and knees.  Blake Summers reports no change in his pain, at a 9. It is a sharp, stabbing, aching pain.  General activity level is a 9. Pain is worse during the day.  Sleep patterns are poor.  Pain is worse with walking and standing.  Rest, heat, medication, and injections help. Blake Summers uses a cane or walker for ambulation.  Blake Summers can climb steps and drive. Blake Summers is retired.  Blake Summers needs help with ADLs and meal preparation.  REVIEW OF SYSTEMS:  Notable for difficulties described above as well as some wheezing, trouble walking, dizziness, and numbness.  PAST MEDICAL HISTORY:  Unchanged.  SOCIAL HISTORY:  Unchanged.  FAMILY HISTORY:  Unchanged.  PHYSICAL EXAMINATION:  His blood pressure is 150/61, pulse 98, respirations 18, O2 sats 94% on room air.  His motor strength and sensation are intact, although it is hard for him to do any confrontation testing due to his size.  Blake Summers is morbidly obese.  Blake Summers is alert and oriented x3.  Blake Summers has an unstable gait with a walker.  IMPRESSION: 1. History of right knee osteoarthritis. 2. Chronic low back pain, spondylosis. 3. Chronic venous insufficiency.  PLAN: 1. Refill Percocet 7.5/500 one p.o. t.i.d., 90 with no refill. 2. MS Contin 15 mg 1 p.o. b.i.d., 60 with no refills.  Questions were     encouraged and answered.  Blake Summers will see Dr. Pamelia Hoit in a month.     Devann Cribb L. Blima Dessert Electronically Signed    RLW/MedQ D:  05/03/2011 11:39:08  T:  05/03/2011 14:26:08  Job #:  [id number]

## 2011-05-12 ENCOUNTER — Other Ambulatory Visit: Payer: Self-pay | Admitting: Endocrinology

## 2011-05-31 ENCOUNTER — Encounter
Payer: Medicare Other | Attending: Physical Medicine and Rehabilitation | Admitting: Physical Medicine and Rehabilitation

## 2011-05-31 DIAGNOSIS — G8929 Other chronic pain: Secondary | ICD-10-CM | POA: Insufficient documentation

## 2011-05-31 DIAGNOSIS — G894 Chronic pain syndrome: Secondary | ICD-10-CM

## 2011-05-31 DIAGNOSIS — M545 Low back pain, unspecified: Secondary | ICD-10-CM | POA: Insufficient documentation

## 2011-05-31 DIAGNOSIS — R5381 Other malaise: Secondary | ICD-10-CM | POA: Insufficient documentation

## 2011-05-31 DIAGNOSIS — M1710 Unilateral primary osteoarthritis, unspecified knee: Secondary | ICD-10-CM | POA: Insufficient documentation

## 2011-05-31 DIAGNOSIS — M25569 Pain in unspecified knee: Secondary | ICD-10-CM | POA: Insufficient documentation

## 2011-05-31 DIAGNOSIS — I872 Venous insufficiency (chronic) (peripheral): Secondary | ICD-10-CM | POA: Insufficient documentation

## 2011-05-31 DIAGNOSIS — M47817 Spondylosis without myelopathy or radiculopathy, lumbosacral region: Secondary | ICD-10-CM | POA: Insufficient documentation

## 2011-06-01 NOTE — Assessment & Plan Note (Signed)
Blake Summers is a pleasant 65 year old gentleman accompanied by his wife who is in the room with his permission.  Blake Summers is seen at [facility] for chronic pain complaints related to his low back as well as his right knee.  He has near end- stage osteoarthritis of the right knee and a lumbar spondylosis without instability.  His average pain is about 9 on a scale of 10 described as sharp, stabbing, aching, worse with walking and standing.  Improves with rest, heat, medication and injections.  He reports the pain interferes significantly with activity level.  Average pain is about a 9 on a scale of 10.  He reports fair to good relief with the use of an current medications.  FUNCTIONAL STATUS:  He is not able to walk much more than 4 minutes at a time.  He is able to climb stairs, but a limited number.  He is able to drive.  He requires assistance with dressing and bathing.  He is independent with feeding and toileting.  REVIEW OF SYSTEMS:  Positive for numbness in the left lower extremity, trouble walking, dizziness on occasion.  Denies depression, anxiety or suicidal ideation.  Denies problems controlling bowel or bladder.  He recently underwent some cardiac work up.  This was reviewed on E- chart today.  He was seen by Marca Ancona.  He had a false positive Myoview with no significant coronary artery disease on left heart catheterization.  It was felt that his dyspnea is primarily due to obesity and deconditioning and no cardiac contraindication to aggressive rehabilitation was noted on April 26, 2011, see note for further details on that date.  PAST MEDICAL, SOCIAL AND FAMILY HISTORY:  Otherwise noncontributory.  On exam today, her blood pressure is 156/56, Summers 89, respirations 18 and 89% saturated on room air.  He is a morbidly obese gentleman who does not appear in any distress.  He is oriented x3.  Speech is clear. Affect is bright.   He is alert, cooperative and pleasant.  Follows commands without difficulty.  Answers my questions appropriately. Cranial nerves and coordination are intact.  His reflexes are diminished in upper and lower extremities.  No abnormal tone, clonus or tremors are noted.  Motor strength is generally 5/5 without obvious focal deficit.  No sensory deficits are appreciated in lower extremities today.  Transitioning from sitting to standing is done with difficulty.  He needs some assistance going from sit to stand.  He has antalgic gait with a wide stance due to girth and become short of breath easily.  Limitations noted in lumbar motion, possible hip flexion contracture noted as well difficult to assess due to significant excess tissue.  IMPRESSION: 1. End-stage right knee osteoarthritis. 2. Morbid obesity. 3. Generally deconditioned. 4. Chronic low back pain/spondylosis. 5. Chronic venous insufficiency.  PLAN:  I would like to begin the patient on an rehabilitation program with emphasis on lower extremity strengthening, flexibility, increasing endurance, working on transfers from sit to stand, as well as balance. I would like him  to pursue water therapy as well and home exercise program possibly transferring eventually to a [employer].  I would like PT to see him 2-3 times a week for the next 4-6 weeks.  Both he and his wife are in agreement.  I have written a prescription for physical therapy for him.  I will refill his MS Contin 15 mg 1 p.o. b.i.d. as well as Percocet 7.5/500 t.i.d. #90.  I  have answered all her questions.  They are comfortable with our plan currently.  I will see him back in a month.     Brantley Stage, M.D. Electronically Signed    DMK/MedQ D:  05/31/2011 13:36:01  T:  06/01/2011 06:17:34  Job #:  [id number]

## 2011-06-10 ENCOUNTER — Other Ambulatory Visit: Payer: Self-pay | Admitting: Endocrinology

## 2011-06-28 ENCOUNTER — Encounter: Payer: Medicare Other | Attending: Neurosurgery | Admitting: Neurosurgery

## 2011-06-28 DIAGNOSIS — G894 Chronic pain syndrome: Secondary | ICD-10-CM

## 2011-06-28 DIAGNOSIS — M1710 Unilateral primary osteoarthritis, unspecified knee: Secondary | ICD-10-CM

## 2011-06-28 DIAGNOSIS — M47817 Spondylosis without myelopathy or radiculopathy, lumbosacral region: Secondary | ICD-10-CM | POA: Insufficient documentation

## 2011-06-28 DIAGNOSIS — M545 Low back pain, unspecified: Secondary | ICD-10-CM | POA: Insufficient documentation

## 2011-06-28 DIAGNOSIS — M25569 Pain in unspecified knee: Secondary | ICD-10-CM | POA: Insufficient documentation

## 2011-06-28 DIAGNOSIS — R209 Unspecified disturbances of skin sensation: Secondary | ICD-10-CM | POA: Insufficient documentation

## 2011-06-29 NOTE — Assessment & Plan Note (Signed)
This is a patient of Dr. Pamelia Hoit, seen for right knee pain, lower extremity and low back pain.  He rates his pain unchanged at 8 or 9.  It is a sharp, stabbing, aching pain.  General activity level is 8-9.  The pain is worse during the day.  Sleep patterns are poor.  Walking and standing aggravate. Medication and injections tend to help.  He uses a cane, walker or walking stick for mobilization.  He does climb steps. He can drive.  Functionally, he is unemployed.  He needs help with ADLs and household duties.  REVIEW OF SYSTEMS:  Notable for difficulties described above as well as some numbness and dizziness.  No suicidal thoughts or aberrant behaviors.  Last pill count and UDS consistent.  PAST MEDICAL HISTORY, SOCIAL HISTORY, AND FAMILY HISTORY:  Unchanged.  PHYSICAL EXAMINATION:  Blood pressure is 127/65, pulse 93, respirations 16, O2 sats 90 on room air.  His motor strength and sensation appear to be intact.  It is hard to test due to his size. Constitutionally, he is morbidly obese.  He is alert and oriented x3.  He is in a wheelchair for mobility today.  ASSESSMENT: 1. End-stage right knee osteoarthritis. 2. Morbid obesity. 3. Chronic low back pain and spondylosis.  PLAN: 1. Refill Percocet 7.5/500 one p.o. t.i.d., 90 with no refill. 2. Ms Contin 15 mg 1 p.o. b.i.d., 60 with no refills.  His questions     were encouraged and answered.  Dr. Pamelia Hoit will see him next     month.     Willoughby Doell L. Blima Dessert Electronically Signed    RLW/MedQ D:  06/28/2011 13:31:14  T:  06/29/2011 05:54:32  Job #:  [id number]

## 2011-07-10 ENCOUNTER — Other Ambulatory Visit: Payer: Self-pay | Admitting: Endocrinology

## 2011-07-26 ENCOUNTER — Encounter
Payer: Medicare Other | Attending: Physical Medicine and Rehabilitation | Admitting: Physical Medicine and Rehabilitation

## 2011-07-26 DIAGNOSIS — R5381 Other malaise: Secondary | ICD-10-CM | POA: Insufficient documentation

## 2011-07-26 DIAGNOSIS — M545 Low back pain, unspecified: Secondary | ICD-10-CM

## 2011-07-26 DIAGNOSIS — I872 Venous insufficiency (chronic) (peripheral): Secondary | ICD-10-CM | POA: Insufficient documentation

## 2011-07-26 DIAGNOSIS — G894 Chronic pain syndrome: Secondary | ICD-10-CM

## 2011-07-26 DIAGNOSIS — M25569 Pain in unspecified knee: Secondary | ICD-10-CM | POA: Insufficient documentation

## 2011-07-26 DIAGNOSIS — M79609 Pain in unspecified limb: Secondary | ICD-10-CM

## 2011-07-26 DIAGNOSIS — M1710 Unilateral primary osteoarthritis, unspecified knee: Secondary | ICD-10-CM | POA: Insufficient documentation

## 2011-07-26 DIAGNOSIS — M47817 Spondylosis without myelopathy or radiculopathy, lumbosacral region: Secondary | ICD-10-CM | POA: Insufficient documentation

## 2011-07-26 DIAGNOSIS — I890 Lymphedema, not elsewhere classified: Secondary | ICD-10-CM | POA: Insufficient documentation

## 2011-07-26 DIAGNOSIS — G8929 Other chronic pain: Secondary | ICD-10-CM | POA: Insufficient documentation

## 2011-07-27 NOTE — Assessment & Plan Note (Signed)
[name] is a pleasant 66 year old gentleman who is accompanied by his wife today.  He is followed [facility] at [facility] for chronic pain complaints, which are related predominantly to his low back and his right knee.  He also has difficulty with bilateral lower extremity swelling from lymphedema.  Average pain is between an 8 and 9 on a scale of 10.  Pain is described as sharp and aching in nature.  Sleep is poor.  Pain is worse with activities, improves with rest and medication and injections.  He gets fair to good relief with current meds.  FUNCTIONAL STATUS:  He has limited ambulation, not more than a few minutes.  Currently, he does use a walker as well as a wheelchair for longer distances.  He has difficulty with stairs.  He is able to drive. He requires assistance with dressing and bathing.  He is independent with feeding and toileting.  Denies problems with bowel or bladder.  Admits to depression, anxiety. Denies suicidal ideation.  No other changes in past medical, social, or family history.  He maintains contact with Dr. Everardo All, his primary care physician.  PHYSICAL EXAMINATION:  VITAL SIGNS:  Today, his blood pressure is 138/55, pulse 95, respirations 20, 90% saturated on room air. GENERAL:  He is a morbidly obese gentleman, 67 inches tall and 356 pounds.  He is oriented x3.  Speech is clear.  Affect is bright.  He is alert, cooperative, and pleasant.  Follows commands without difficulty. Answers my questions appropriately. NEUROLOGIC:  Cranial nerves, coordination are intact.  His reflexes are diminished in the lower extremities.  He has good strength, however, in both lower extremities at hip flexors, knee extensors, dorsiflexors and plantar flexors.  Sensation is intact in the lower extremities.  He is noted to have swelling.  He has painful joint line over his right knee. Unable to appreciate any significant effusion due to  excess soft tissue.  IMPRESSION: 1. End-stage right knee osteoarthritis. 2. Morbid obesity. 3. Generally deconditioned. 4. Chronic low back pain/spondylosis. 5. Chronic venous insufficiency/lymphedema.  PLAN:  Refill his MS Contin 15 mg 1 p.o. b.i.d., #60; Topamax 50 mg 1 p.o. b.i.d., #60; and Percocet 7.5/500, #90 p.r.n. 3 times a day.  Last urine drug screen, April 03, 2011, consistent pill counts today from his narcotics are appropriate.  He is starting physical therapy this week.  He has already had an evaluation, emphasized on hydrotherapy, and improving lower extremity strength and endurance as well as range of motion.  He is going to be over at the [employer] [employer].  His wife continues to wrap his lower extremities to help prevent swelling.  He is comfortable with our treatment plan.  I have answered all his questions.  I will see him back in a month.     Brantley Stage, M.D.    DMK/MedQ D:  07/26/2011 14:04:14  T:  07/27/2011 10:19:19  Job #:  [id number]

## 2011-07-28 ENCOUNTER — Other Ambulatory Visit (INDEPENDENT_AMBULATORY_CARE_PROVIDER_SITE_OTHER): Payer: Medicare Other

## 2011-07-28 ENCOUNTER — Encounter: Payer: Self-pay | Admitting: Endocrinology

## 2011-07-28 ENCOUNTER — Other Ambulatory Visit: Payer: Self-pay | Admitting: Endocrinology

## 2011-07-28 ENCOUNTER — Ambulatory Visit (INDEPENDENT_AMBULATORY_CARE_PROVIDER_SITE_OTHER): Payer: Medicare Other | Admitting: Endocrinology

## 2011-07-28 ENCOUNTER — Other Ambulatory Visit: Payer: Self-pay | Admitting: *Deleted

## 2011-07-28 DIAGNOSIS — K7689 Other specified diseases of liver: Secondary | ICD-10-CM

## 2011-07-28 DIAGNOSIS — I10 Essential (primary) hypertension: Secondary | ICD-10-CM

## 2011-07-28 DIAGNOSIS — E119 Type 2 diabetes mellitus without complications: Secondary | ICD-10-CM

## 2011-07-28 DIAGNOSIS — Z87442 Personal history of urinary calculi: Secondary | ICD-10-CM

## 2011-07-28 DIAGNOSIS — R7989 Other specified abnormal findings of blood chemistry: Secondary | ICD-10-CM

## 2011-07-28 DIAGNOSIS — E291 Testicular hypofunction: Secondary | ICD-10-CM

## 2011-07-28 DIAGNOSIS — Z0000 Encounter for general adult medical examination without abnormal findings: Secondary | ICD-10-CM

## 2011-07-28 DIAGNOSIS — E1149 Type 2 diabetes mellitus with other diabetic neurological complication: Secondary | ICD-10-CM

## 2011-07-28 DIAGNOSIS — E785 Hyperlipidemia, unspecified: Secondary | ICD-10-CM

## 2011-07-28 DIAGNOSIS — E1049 Type 1 diabetes mellitus with other diabetic neurological complication: Secondary | ICD-10-CM

## 2011-07-28 DIAGNOSIS — E1142 Type 2 diabetes mellitus with diabetic polyneuropathy: Secondary | ICD-10-CM

## 2011-07-28 DIAGNOSIS — N400 Enlarged prostate without lower urinary tract symptoms: Secondary | ICD-10-CM

## 2011-07-28 DIAGNOSIS — Z79899 Other long term (current) drug therapy: Secondary | ICD-10-CM

## 2011-07-28 LAB — TSH: TSH: 1.34 u[IU]/mL (ref 0.35–5.50)

## 2011-07-28 LAB — MICROALBUMIN / CREATININE URINE RATIO: Creatinine,U: 34.4 mg/dL

## 2011-07-28 LAB — CBC WITH DIFFERENTIAL/PLATELET
Basophils Absolute: 0 10*3/uL (ref 0.0–0.1)
Eosinophils Absolute: 0.3 10*3/uL (ref 0.0–0.7)
Lymphocytes Relative: 10.1 % — ABNORMAL LOW (ref 12.0–46.0)
MCHC: 33.1 g/dL (ref 30.0–36.0)
Monocytes Absolute: 0.6 10*3/uL (ref 0.1–1.0)
Neutrophils Relative %: 79.4 % — ABNORMAL HIGH (ref 43.0–77.0)
Platelets: 217 10*3/uL (ref 150.0–400.0)
RBC: 4.16 Mil/uL — ABNORMAL LOW (ref 4.22–5.81)
RDW: 15.3 % — ABNORMAL HIGH (ref 11.5–14.6)

## 2011-07-28 LAB — BASIC METABOLIC PANEL
CO2: 32 mEq/L (ref 19–32)
Calcium: 8.8 mg/dL (ref 8.4–10.5)
Creatinine, Ser: 1 mg/dL (ref 0.4–1.5)

## 2011-07-28 LAB — HEPATIC FUNCTION PANEL
AST: 25 U/L (ref 0–37)
Alkaline Phosphatase: 57 U/L (ref 39–117)
Bilirubin, Direct: 0.1 mg/dL (ref 0.0–0.3)
Total Bilirubin: 0.4 mg/dL (ref 0.3–1.2)

## 2011-07-28 LAB — URINALYSIS, ROUTINE W REFLEX MICROSCOPIC
Bilirubin Urine: NEGATIVE
Hgb urine dipstick: NEGATIVE
Leukocytes, UA: NEGATIVE
Nitrite: NEGATIVE
Urobilinogen, UA: 0.2 (ref 0.0–1.0)
pH: 6 (ref 5.0–8.0)

## 2011-07-28 LAB — URIC ACID: Uric Acid, Serum: 5.1 mg/dL (ref 4.0–7.8)

## 2011-07-28 LAB — LIPID PANEL
HDL: 28.8 mg/dL — ABNORMAL LOW (ref >39.00)
LDL Cholesterol: 43 mg/dL (ref 0–99)
Total CHOL/HDL Ratio: 4

## 2011-07-28 LAB — TESTOSTERONE: Testosterone: 148.82 ng/dL — ABNORMAL LOW (ref 350.00–890.00)

## 2011-07-28 LAB — PSA: PSA: 0.5 ng/mL (ref 0.10–4.00)

## 2011-07-28 MED ORDER — CLOMIPHENE CITRATE 50 MG PO TABS: 50.0000 mg | ORAL_TABLET | Freq: Every day | ORAL | Status: DC

## 2011-07-28 NOTE — Patient Instructions (Addendum)
please consider these measures for your health:  minimize alcohol.  do not use tobacco products.  have a colonoscopy at least every 10 years from age 66.  keep firearms safely stored.  always use seat belts.  have working smoke alarms in your home.  see an eye doctor and dentist regularly.  never drive under the influence of alcohol or drugs (including prescription drugs).  those with fair skin should take precautions against the sun. please let me know what your wishes would be, if artificial life support measures should become necessary.  it is critically important to prevent falling down (keep floor areas well-lit, dry, and free of loose objects.  If you have a cane, walker, or wheelchair, you should use it, even for short trips around the house.  Also, try not to rush).   Please come back for a follow-up appointment in 3 months.   blood tests are being requested for you today.  please call [phone] to hear your test results.  You will be prompted to enter the 9-digit "MRN" number that appears at the top left of this page, followed by #.  Then you will hear the message. Reduce nph insulin to 100 units at bedtime.   check your blood sugar 2 times a day.  vary the time of day when you check, between before the 3 meals, and at bedtime.  also check if you have symptoms of your blood sugar being too high or too low.  please keep a record of the readings and bring it to your next appointment here.  please call us sooner if your blood sugar goes below 70, or if it stays over 200.   Here are 2 new meters.  Call an [phone]) number on TV, to get free strips.   (update: i left message on phone-tree:  i advised testim).

## 2011-07-28 NOTE — Telephone Encounter (Signed)
R'cd fax from [facility] for refill of Clomid.

## 2011-07-28 NOTE — Progress Notes (Signed)
Subjective:    Patient ID: Blake Summers, male    DOB: 30-Jul-1945, 66 y.o.   MRN: [id number]  HPI The state of at least three ongoing medical problems is addressed today: Pt returns for f/u of insulin-requiring DM (1990).  denies hypoglycemia.  he brings a record of his cbg's which i have reviewed today.  It varies from 59-200, but most are in the 100's. Hypogonadism: No change in chronic sob.   Dyslipidemia: Denies chest pain.   Past Medical History  Diagnosis Date  . GERD 07/06/2010  . Edema 10/28/2007  . DIABETES MELLITUS, TYPE II 05/04/2007  . HYPERLIPIDEMIA 07/08/2008  . ALLERGIC RHINITIS 07/08/2008  . GLAUCOMA 07/08/2008  . CEREBROVASCULAR ACCIDENT, HX OF 07/08/2008  . OBSTRUCTIVE SLEEP APNEA 03/06/2008  . DIABETIC ULCER, LEFT LEG 08/23/2009  . 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 HYPERTENSION 10/28/2007  . VENOUS INSUFFICIENCY 03/08/2009  . FATTY LIVER DISEASE 05/19/2008  . BENIGN PROSTATIC HYPERTROPHY 07/08/2008  . 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 NEPHROLITHIASIS, HX OF 07/08/2008  . Morbid obesity   . DM nephropathy/sclerosis   . 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 Lumbar spondylosis     No past surgical history on file.  History   Social History  . Marital Status: Married    Spouse Name: N/A    Number of Children: N/A  . Years of Education: N/A   Occupational History  . Retired     Nature conservation officer   Social History Main Topics  . Smoking status: Former Games developer  . Smokeless tobacco: Not on file  . Alcohol Use: No  . Drug Use: No  . Sexually Active:    Other Topics Concern  . Not on file   Social History Narrative   Diet is "good"Exercise is limited by medical problems    Current Outpatient Prescriptions on File Prior to Visit  Medication Sig Dispense Refill  . AGGRENOX 25-200 MG per 12 hr capsule TAKE 1 CAPSULE BY MOUTH 2 TIMES         DAILY  60 each  5  . albuterol (PROVENTIL HFA) 108 (90 BASE) MCG/ACT inhaler Inhale 1 puff  into the lungs every 4 (four) hours as needed.        Marland Kitchen allopurinol (ZYLOPRIM) 300 MG tablet TAKE 1 TABLET BY MOUTH EVERY DAY  30 tablet  5  . brimonidine (ALPHAGAN) 0.15 % ophthalmic solution as directed.      Marland Kitchen CLARITIN-D 12 HOUR 5-120 MG per tablet TAKE ONE (1) TABLET BY MOUTH TWO (2)    TIMES DAILY AS NEEDED  60 tablet  5  . clobetasol (TEMOVATE) 0.05 % ointment APPLY TO AFFECTED AREA 3 TIMES DAILY    AS NEEDED ITCHING  60 g  4  . diltiazem (CARDIZEM CD) 180 MG 24 hr capsule TAKE 2 CAPSULES BY MOUTH DAILY  60 capsule  5  . docusate sodium (COLACE) 100 MG capsule Take 100 mg by mouth 2 (two) times daily as needed.      . dorzolamide-timolol (COSOPT) 22.3-6.8 MG/ML ophthalmic solution as directed.      . furosemide (LASIX) 80 MG tablet TAKE 1 TABLET BY MOUTH EVERY DAY  30 tablet  5  . glucose blood (ONE TOUCH TEST STRIPS) test strip Check blood sugar qid       . insulin lispro (HUMALOG KWIKPEN) 100 UNIT/ML injection Inject subcutaneously 3x a day (just before each meal) 90-120-160 units  110 mL  5  . Insulin  Pen Needle (NOVOFINE) 30G X 8 MM MISC Use four times a day dx 250.00  100 each  5  . ketoconazole (NIZORAL) 2 % shampoo as directed.      Marland Kitchen KLOR-CON M20 20 MEQ tablet TAKE 1 TABLET BY MOUTH 2 TIMES A DAY  60 each  5  . latanoprost (XALATAN) 0.005 % ophthalmic solution as directed.      . lidocaine (LIDODERM) 5 % Place 1-3 patches onto the skin daily. Remove & Discard patch within 12 hours or as directed by MD      . losartan-hydrochlorothiazide (HYZAAR) 100-25 MG per tablet TAKE 1 TABLET BY MOUTH EVERY DAY FOR    BLOOD PRESSURE  30 tablet  5  . lovastatin (MEVACOR) 40 MG tablet TAKE 2 TABLETS BY MOUTH EVERY NIGHT     AT BEDTIME  60 tablet  5  . metFORMIN (GLUCOPHAGE-XR) 500 MG 24 hr tablet TAKE 2 TABLETS BY MOUTH                 2 TIMES DAILY  120 tablet  5  . methocarbamol (ROBAXIN) 500 MG tablet TAKE ONE TABLET BY MOUTH EVERY NIGHT    AT BEDTIME AS NEEDED FOR CRAMPS  30 tablet  5  .  morphine (MS CONTIN) 15 MG 12 hr tablet Take 15 mg by mouth 2 (two) times daily.        . naproxen sodium (ANAPROX) 220 MG tablet 2 tablets by mouth once daily as needed for pain       . oxyCODONE-acetaminophen (PERCOCET) 7.5-500 MG per tablet 1 tablet by mouth three times a day as needed       . polyethylene glycol powder (GLYCOLAX/MIRALAX) powder MIX AND TAKE 17GRAMS DAILY AS           PHYSICIAN INSTRUCTED  527 g  5  . ranitidine (ZANTAC) 150 MG tablet TAKE 1 TABLET BY MOUTH 2 TIMES A DAY    AS NEEDED HEARTBURN  60 tablet  5  . topiramate (TOPAMAX) 50 MG tablet Take 50 mg by mouth 2 (two) times daily.        Marland Kitchen zolpidem (AMBIEN) 10 MG tablet Take 10 mg by mouth at bedtime as needed.          Allergies  Allergen Reactions  . Neosporin (Neomycin-Polymyx-Gramicid)     Family History  Problem Relation Age of Onset  . Cancer Brother     Colon Cancer  . Heart disease Brother   . Heart disease Brother     BP 136/76  Pulse 112  Temp(Src) 98.3 F (36.8 C) (Oral)  Ht 5\' 4"  (1.626 m)  Wt 353 lb (160.12 kg)  BMI 60.59 kg/m2  SpO2 96%    Review of Systems He has lost weight, due to his efforts.  He has mildly reduced urinary stream.      Objective:   Physical Exam Pulses: dorsalis pedis intact bilat.  Feet: no deformity. no ulcer on the feet. feet are of normal color and temp. 1+ bilat leg edema. There are bilat varicosities. There is bilat rust colored discoloration.  Left leg: wrapped (sees hp wound care).  Neuro: sensation is intact to touch on the feet, but decreased from normal.   Lab Results  Component Value Date   TESTOSTERONE 148.82* 07/28/2011   Lab Results  Component Value Date   HGBA1C 6.7* 07/28/2011   Lab Results  Component Value Date   CHOL 107 07/28/2011   HDL 28.80* 07/28/2011  LDLCALC 43 07/28/2011   TRIG 174.0* 07/28/2011   CHOLHDL 4 07/28/2011      Assessment & Plan:  Hypogonadism, needs increased rx DM, slightly overconbtrolled Dyslipidemia,  well-controlled    Subjective:   Patient here for Medicare annual wellness visit and management of other chronic and acute problems.     Risk factors: advanced age    Roster of Physicians Providing Medical Care to Patient: Cardiol:  mclean Opthal: hecker Prm: kirchmayer Pulm: clance Urol: borden   Activities of Daily Living: In your present state of health, do you have any difficulty performing the following activities?: (pt is confined to wheelchair) Preparing food and eating?: yes Bathing yourself: yes Getting dressed: yes Using the toilet: no Moving around from place to place: yes In the past year have you fallen or had a near fall? No    Home Safety: Has smoke detector and wears seat belts. No firearms. No excess sun exposure.  Diet and Exercise  Current exercise habits:  Severely limited by med probs Dietary issues discussed: pt reports a healthy diet   Depression Screen  Q1: Over the past two weeks, have you felt down, depressed or hopeless? no  Q2: Over the past two weeks, have you felt little interest or pleasure in doing things? no   The following portions of the patient's history were reviewed and updated as appropriate: allergies, current medications, past family history, past medical history, past social history, past surgical history and problem list.   Review of Systems  Denies hearing loss, and visual loss Objective:   Vision:  Sees opthalmologist Hearing: grossly normal Body mass index:  See vs page Msk: pt is completely unable to perform "get-up-and-go" from a sitting position Cognitive Impairment Assessment: cognition, memory and judgment appear normal.  remembers 3/3 at 5 minutes.  excellent recall.  can easily read and write a sentence.  alert and oriented x 3   Assessment:   Medicare wellness utd on preventive parameters    Plan:   During the course of the visit the patient was educated and counseled about appropriate screening and preventive  services including:       Fall prevention Diabetes screening  Nutrition counseling   Vaccines / LABS Zostavax / Pnemonccoal Vaccine  today  PSA  Patient Instructions (the written plan) was given to the patient.

## 2011-07-31 LAB — PTH, INTACT AND CALCIUM: PTH: 39.6 pg/mL (ref 14.0–72.0)

## 2011-08-03 ENCOUNTER — Encounter: Payer: Self-pay | Admitting: *Deleted

## 2011-08-04 NOTE — Telephone Encounter (Signed)
A user error has taken place: encounter opened in error, closed for administrative reasons.

## 2011-08-09 ENCOUNTER — Other Ambulatory Visit: Payer: Self-pay | Admitting: *Deleted

## 2011-08-09 ENCOUNTER — Other Ambulatory Visit: Payer: Self-pay | Admitting: Endocrinology

## 2011-08-09 MED ORDER — TOPIRAMATE 50 MG PO TABS: 50.0000 mg | ORAL_TABLET | Freq: Two times a day (BID) | ORAL | Status: DC

## 2011-08-17 ENCOUNTER — Telehealth: Payer: Self-pay | Admitting: *Deleted

## 2011-08-17 NOTE — Telephone Encounter (Signed)
ok 

## 2011-08-17 NOTE — Telephone Encounter (Signed)
Pt wants to know if it is safe for him to get TDAP immunization.

## 2011-08-18 NOTE — Telephone Encounter (Signed)
Pt informed of MD's advisement. Transferred to scheduler to get appointment on injection schedule.

## 2011-08-22 ENCOUNTER — Ambulatory Visit (INDEPENDENT_AMBULATORY_CARE_PROVIDER_SITE_OTHER): Payer: Medicare Other | Admitting: *Deleted

## 2011-08-22 DIAGNOSIS — Z23 Encounter for immunization: Secondary | ICD-10-CM

## 2011-09-04 ENCOUNTER — Telehealth: Payer: Self-pay | Admitting: Pulmonary Disease

## 2011-09-04 NOTE — Telephone Encounter (Signed)
Per Alida send message to her and she will see if she has CMN on patient.

## 2011-09-04 NOTE — Telephone Encounter (Signed)
Pt was last seen 06/07/09 for osa. Was to followup iin 1 year, pt has not been seen since called [name] and spoke to [place] informed her of this.  Kandice Hams

## 2011-09-04 NOTE — Telephone Encounter (Signed)
Spoke with Katrina @ [name], informed her this pt has never been seen in pulmonary office, no documentation of cpap by Dr Shelle Iron. This was faxed by to [name] last week in ref to this .Blake Summers

## 2011-09-06 ENCOUNTER — Encounter: Payer: Self-pay | Admitting: Physical Medicine and Rehabilitation

## 2011-09-06 ENCOUNTER — Encounter: Payer: Medicare Other | Attending: Neurosurgery | Admitting: Physical Medicine and Rehabilitation

## 2011-09-06 VITALS — BP 149/67 | HR 105 | Resp 16 | Ht 67.0 in | Wt 360.0 lb

## 2011-09-06 DIAGNOSIS — R269 Unspecified abnormalities of gait and mobility: Secondary | ICD-10-CM | POA: Insufficient documentation

## 2011-09-06 DIAGNOSIS — M545 Low back pain, unspecified: Secondary | ICD-10-CM | POA: Insufficient documentation

## 2011-09-06 DIAGNOSIS — M25569 Pain in unspecified knee: Secondary | ICD-10-CM | POA: Insufficient documentation

## 2011-09-06 DIAGNOSIS — M1711 Unilateral primary osteoarthritis, right knee: Secondary | ICD-10-CM

## 2011-09-06 DIAGNOSIS — M25561 Pain in right knee: Secondary | ICD-10-CM

## 2011-09-06 DIAGNOSIS — M1710 Unilateral primary osteoarthritis, unspecified knee: Secondary | ICD-10-CM | POA: Insufficient documentation

## 2011-09-06 DIAGNOSIS — IMO0002 Reserved for concepts with insufficient information to code with codable children: Secondary | ICD-10-CM | POA: Insufficient documentation

## 2011-09-06 DIAGNOSIS — R2689 Other abnormalities of gait and mobility: Secondary | ICD-10-CM

## 2011-09-06 MED ORDER — TOPIRAMATE 50 MG PO TABS: 50.0000 mg | ORAL_TABLET | Freq: Two times a day (BID) | ORAL | Status: DC

## 2011-09-06 MED ORDER — OXYCODONE-ACETAMINOPHEN 7.5-500 MG PO TABS: 1.0000 | ORAL_TABLET | Freq: Three times a day (TID) | ORAL | Status: DC | PRN

## 2011-09-06 MED ORDER — MORPHINE SULFATE CR 15 MG PO TB12: 15.0000 mg | ORAL_TABLET | Freq: Two times a day (BID) | ORAL | Status: DC

## 2011-09-06 NOTE — Progress Notes (Signed)
Subjective:    Patient ID: Blake Summers, male    DOB: 05/06/1946, 66 y.o.   MRN: [id number]  HPI  He says 66 year old gentleman with solid [facility] for chronic pain complaints which are related to the low back and a right knee.  He has been participating in physical therapy program and reports to me he's been to approximately 10 visits now.  He reports he had some new soreness in various muscle groups. He's been using parallel bars for walking and has also been on the treadmill which is in the pool. He's not sure how long he been walking but possibly up to 15 minutes now. His pain is located in the area of hamstrings and quadriceps.   The refill in his medications today. He needs another prescription for physical therapy as well  Immediate contact with his primary care doctor for his other medical problems    Pain Inventory Average Pain 7 Pain Right Now 8 My pain is sharp, stabbing and aching  In the last 24 hours, has pain interfered with the following? General activity 9 Relation with others 9 Enjoyment of life 8 What TIME of day is your pain at its worst? daytime Sleep (in general) Poor  Pain is worse with: walking and standing Pain improves with: rest, medication and injections Relief from Meds: 6  Mobility walk with assistance use a cane use a walker how many minutes can you walk? 5 ability to climb steps?  yes do you drive?  yes use a wheelchair transfers alone  Function retired I need assistance with the following:  dressing, meal prep, household duties and shopping  Neuro/Psych numbness trouble walking dizziness  Prior Studies Any changes since last visit?  no  Physicians involved in your care Any changes since last visit?  no  Review of Systems  Constitutional: Negative.   HENT: Negative.   Eyes: Negative.   Respiratory: Negative.   Cardiovascular: Negative.   Gastrointestinal: Negative.     Genitourinary: Negative.   Musculoskeletal: Negative.   Skin: Negative.   Neurological: Positive for dizziness and numbness.  Hematological: Negative.   Psychiatric/Behavioral: Negative.        Objective:   Physical Exam    The patient is a morbidly obese gentleman,  He is oriented x3. Speech is clear. Affect is bright. He is  alert, cooperative, and pleasant. Follows commands without difficulty.  Answers my questions appropriately.  NEUROLOGIC: Cranial nerves, coordination are intact. His reflexes are  diminished in the lower extremities. He has good strength in  both lower extremities at hip flexors, knee extensors, dorsiflexors and  plantar flexors.   Sensation is intact in the lower extremities. He is  noted to have chronic swelling in lower extremities.  Right Knee He has painful joint line over his right knee.(medial and lateral)Motion is preserved. bilateral hip flexion contracture 5-10 degrees  Unable to appreciate any significant effusion due to excess soft tissue.       Assessment & Plan:  IMPRESSION:  1. End-stage right knee osteoarthritis. Can also try icy hot stick or balm), currently using lidoderm as well as knee sleeve, walker 2. Morbid obesity.  3. Generally deconditioned.  4. Chronic low back pain/spondylosis.  5. Chronic venous insufficiency/lymphedema.  6. Gait disorder multifactoral  The Opioid medication pill count was appropriate.  No reported significant side effects of Opioid medications noted.  No aberrant behavior noted.  Patient cautioned regarding operation of machinery or vehicles.  Patient understands risk and benefits of these medications.  There is no indication or report of risk to self or others.    Last UDS : Urine drug screen was 04/07/2011 and was consistent.

## 2011-09-06 NOTE — Patient Instructions (Addendum)
Followup with Blake Summers in 2 months  Followup with nursing staff next month for refill in her pain medications  Please remember to keep your pain medications in a safe place and locked  Icy hot stick or balm

## 2011-09-07 ENCOUNTER — Encounter: Payer: Self-pay | Admitting: Physical Medicine and Rehabilitation

## 2011-10-06 ENCOUNTER — Encounter: Payer: Medicare Other | Attending: Neurosurgery | Admitting: Physical Medicine and Rehabilitation

## 2011-10-06 ENCOUNTER — Encounter: Payer: Self-pay | Admitting: Physical Medicine and Rehabilitation

## 2011-10-06 VITALS — BP 142/52 | HR 61 | Resp 18 | Ht 67.0 in | Wt 360.0 lb

## 2011-10-06 DIAGNOSIS — R269 Unspecified abnormalities of gait and mobility: Secondary | ICD-10-CM

## 2011-10-06 DIAGNOSIS — R2689 Other abnormalities of gait and mobility: Secondary | ICD-10-CM

## 2011-10-06 DIAGNOSIS — M1710 Unilateral primary osteoarthritis, unspecified knee: Secondary | ICD-10-CM

## 2011-10-06 DIAGNOSIS — M25561 Pain in right knee: Secondary | ICD-10-CM | POA: Insufficient documentation

## 2011-10-06 DIAGNOSIS — M549 Dorsalgia, unspecified: Secondary | ICD-10-CM

## 2011-10-06 DIAGNOSIS — M1711 Unilateral primary osteoarthritis, right knee: Secondary | ICD-10-CM

## 2011-10-06 DIAGNOSIS — M545 Low back pain, unspecified: Secondary | ICD-10-CM

## 2011-10-06 DIAGNOSIS — M25569 Pain in unspecified knee: Secondary | ICD-10-CM

## 2011-10-06 DIAGNOSIS — IMO0002 Reserved for concepts with insufficient information to code with codable children: Secondary | ICD-10-CM

## 2011-10-06 MED ORDER — OXYCODONE-ACETAMINOPHEN 7.5-500 MG PO TABS: 1.0000 | ORAL_TABLET | Freq: Three times a day (TID) | ORAL | Status: DC | PRN

## 2011-10-06 MED ORDER — MORPHINE SULFATE CR 15 MG PO TB12: 15.0000 mg | ORAL_TABLET | Freq: Two times a day (BID) | ORAL | Status: DC

## 2011-10-06 NOTE — Patient Instructions (Addendum)
The keep your medications in a safe location and locked up.  Followup with Blake Summers 1 month  Continue pool program to improve strength, flexibility, endurance, and balance. This will also help you maintain your independence.

## 2011-10-06 NOTE — Progress Notes (Signed)
Subjective:    Patient ID: Blake Summers, male    DOB: May 08, 1946, 66 y.o.   MRN: [id number]  HPI   66 year old gentleman with solid here in the [facility] for chronic pain complaints which are related to the low back and a right knee.    He has been participating in physical therapy program and reports to me he's been to approximately 10 visits now.    He reports he had some new soreness in various muscle groups. He's been using parallel bars for walking and has also been on the treadmill which is in the pool. He's not sure how long he been walking but possibly up to 15 minutes now.The refill in his medications today.    He needs another prescription for physical therapy as well    Immediate contact with his primary care doctor for his other medical problems     Pain Inventory Average Pain 8 Pain Right Now 8 My pain is sharp, stabbing and aching  In the last 24 hours, has pain interfered with the following? General activity 9 Relation with others 9 Enjoyment of life 8 What TIME of day is your pain at its worst? daytime Sleep (in general) Poor  Pain is worse with: walking and standing Pain improves with: rest, medication and injections Relief from Meds: 6  Mobility walk with assistance use a cane use a walker ability to climb steps?  yes do you drive?  yes use a wheelchair transfers alone  Function retired I need assistance with the following:  meal prep, household duties and shopping  Neuro/Psych numbness trouble walking dizziness  Prior Studies Any changes since last visit?  no  Physicians involved in your care Any changes since last visit?  no  Review of Systems  Constitutional: Negative.   HENT: Negative.   Eyes: Negative.   Respiratory: Negative.   Cardiovascular: Negative.   Gastrointestinal: Negative.   Genitourinary: Negative.   Musculoskeletal: Negative.   Skin: Negative.   Neurological: Positive for  numbness.  Hematological: Negative.   Psychiatric/Behavioral: Negative.        Objective:   Physical Exam  The patient is a morbidly obese gentleman ,  He is oriented x3. Speech is clear. Affect is bright. He is  alert, cooperative, and pleasant. Follows commands without difficulty.  Answers my questions appropriately.   NEUROLOGIC: Cranial nerves, coordination are intact. His reflexes are  diminished in the lower extremities. He has good strength in  both lower extremities at hip flexors, knee extensors, dorsiflexors and  plantar flexors.  Sensation is intact in the lower extremities. He is  noted to have chronic swelling in lower extremities.  Right Knee  He has painful joint line over his right knee.(medial and lateral)Motion is preserved.  bilateral hip flexion contracture 5-10 degrees  Unable to appreciate any significant effusion due to excess soft tissue.   Antalgic gait, typically uses cane,  Decreased lumbar motion in all planes. Pain especially with extension unchanged from previous exam     Assessment & Plan:  1. End-stage right knee osteoarthritis. Can also try icy hot stick or balm), currently using lidoderm as well as knee sleeve, walker  2. Morbid obesity.  3. Generally deconditioned.  4. Chronic low back pain/spondylosis.  5. Chronic venous insufficiency/lymphedema.  6. Gait disorder multifactoral    Oxycodone 7.5/500 one per os 3 times a day  MS Contin 15 mg every 12 hours   The Opioid medication pill count was  appropriate. No reported significant side effects of Opioid medications noted. No aberrant behavior noted. Patient cautioned regarding operation of machinery or vehicles. Patient understands risk and benefits of these medications. There is no indication or report of risk to self or others.   Last UDS :  Urine drug screen was   Patient is tolerating pool exercise. His wife notes improvement in self-care. He now has the endurance and strength  to shower himself. He is also able to dress himself independently with the exception shoes. He is also able to get his compression hose on independently. Prior to this his wife was having to help him with all of these activities.  He is making some gains in endurance and strength.

## 2011-10-07 ENCOUNTER — Other Ambulatory Visit: Payer: Self-pay | Admitting: Endocrinology

## 2011-10-09 ENCOUNTER — Other Ambulatory Visit: Payer: Self-pay

## 2011-10-09 MED ORDER — TOPIRAMATE 50 MG PO TABS: 50.0000 mg | ORAL_TABLET | Freq: Two times a day (BID) | ORAL | Status: DC

## 2011-10-27 ENCOUNTER — Other Ambulatory Visit (INDEPENDENT_AMBULATORY_CARE_PROVIDER_SITE_OTHER): Payer: Medicare Other

## 2011-10-27 ENCOUNTER — Ambulatory Visit (INDEPENDENT_AMBULATORY_CARE_PROVIDER_SITE_OTHER): Payer: Medicare Other | Admitting: Endocrinology

## 2011-10-27 ENCOUNTER — Encounter: Payer: Self-pay | Admitting: Endocrinology

## 2011-10-27 VITALS — BP 102/60 | HR 91 | Temp 97.4°F | Resp 18 | Wt 352.0 lb

## 2011-10-27 DIAGNOSIS — E1149 Type 2 diabetes mellitus with other diabetic neurological complication: Secondary | ICD-10-CM

## 2011-10-27 DIAGNOSIS — E1142 Type 2 diabetes mellitus with diabetic polyneuropathy: Secondary | ICD-10-CM

## 2011-10-27 DIAGNOSIS — E1065 Type 1 diabetes mellitus with hyperglycemia: Secondary | ICD-10-CM

## 2011-10-27 DIAGNOSIS — E1049 Type 1 diabetes mellitus with other diabetic neurological complication: Secondary | ICD-10-CM

## 2011-10-27 LAB — HEMOGLOBIN A1C: Hgb A1c MFr Bld: 6.7 % — ABNORMAL HIGH (ref 4.6–6.5)

## 2011-10-27 MED ORDER — TESTOSTERONE 50 MG/5GM (1%) TD GEL: 5.0000 g | Freq: Every day | TRANSDERMAL | Status: DC

## 2011-10-27 NOTE — Patient Instructions (Addendum)
Please come back for a follow-up appointment in 4 months.   blood tests are being requested for you today.  You will receive a letter with results. check your blood sugar 2 times a day.  vary the time of day when you check, between before the 3 meals, and at bedtime.  also check if you have symptoms of your blood sugar being too high or too low.  please keep a record of the readings and bring it to your next appointment here.  please call us sooner if your blood sugar goes below 70, or if it stays over 200.   Here is a prescription for the testosterone.

## 2011-10-27 NOTE — Progress Notes (Signed)
Subjective:    Patient ID: Blake Summers, male    DOB: 1946-04-06, 66 y.o.   MRN: [id number]  HPI Pt returns for f/u of insulin-requiring DM (dx'ed 1990; complicated by peripheral sensory neuropathy, mild CAD, CVA, and foot ulcer).  He was hospitalized in [place] last week for a leg ulcer.  no cbg record, but states cbg's are well-controlled.   Past Medical History  Diagnosis Date  . GERD 07/06/2010  . Edema 10/28/2007  . DIABETES MELLITUS, TYPE II 05/04/2007  . HYPERLIPIDEMIA 07/08/2008  . ALLERGIC RHINITIS 07/08/2008  . GLAUCOMA 07/08/2008  . CEREBROVASCULAR ACCIDENT, HX OF 07/08/2008  . OBSTRUCTIVE SLEEP APNEA 03/06/2008  . DIABETIC ULCER, LEFT LEG 08/23/2009  . 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 HYPERTENSION 10/28/2007  . VENOUS INSUFFICIENCY 03/08/2009  . FATTY LIVER DISEASE 05/19/2008  . BENIGN PROSTATIC HYPERTROPHY 07/08/2008  . 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 NEPHROLITHIASIS, HX OF 07/08/2008  . Morbid obesity   . DM nephropathy/sclerosis   . 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 Lumbar spondylosis     No past surgical history on file.  History   Social History  . Marital Status: Married    Spouse Name: N/A    Number of Children: N/A  . Years of Education: N/A   Occupational History  . Retired     Nature conservation officer   Social History Main Topics  . Smoking status: Former Games developer  . Smokeless tobacco: Not on file  . Alcohol Use: No  . Drug Use: No  . Sexually Active:    Other Topics Concern  . Not on file   Social History Narrative   Diet is "good"Exercise is limited by medical problems    Current Outpatient Prescriptions on File Prior to Visit  Medication Sig Dispense Refill  . AGGRENOX 25-200 MG per 12 hr capsule TAKE 1 CAPSULE BY MOUTH 2 TIMES         DAILY  60 each  5  . albuterol (PROVENTIL HFA) 108 (90 BASE) MCG/ACT inhaler Inhale 1 puff into the lungs every 4 (four) hours as needed.        Marland Kitchen allopurinol (ZYLOPRIM) 300 MG tablet  TAKE 1 TABLET BY MOUTH EVERY DAY  30 tablet  5  . brimonidine (ALPHAGAN) 0.15 % ophthalmic solution as directed.      Marland Kitchen CLARITIN-D 12 HOUR 5-120 MG per tablet TAKE ONE (1) TABLET BY MOUTH TWO (2)    TIMES DAILY AS NEEDED  60 tablet  5  . clobetasol ointment (TEMOVATE) 0.05 % APPLY TO AFFECTED AREA 3 TIMES DAILY    AS NEEDED ITCHING  60 g  3  . diltiazem (CARDIZEM CD) 180 MG 24 hr capsule TAKE 2 CAPSULES BY MOUTH DAILY  60 capsule  5  . docusate sodium (COLACE) 100 MG capsule Take 100 mg by mouth 2 (two) times daily as needed.      . dorzolamide-timolol (COSOPT) 22.3-6.8 MG/ML ophthalmic solution as directed.      . furosemide (LASIX) 80 MG tablet TAKE 1 TABLET BY MOUTH EVERY DAY  30 tablet  5  . glucose blood (ONE TOUCH TEST STRIPS) test strip Check blood sugar qid       . insulin lispro (HUMALOG KWIKPEN) 100 UNIT/ML injection Inject subcutaneously 3x a day (just before each meal) 90-120-160 units  110 mL  5  . insulin NPH (HUMULIN N,NOVOLIN N) 100 UNIT/ML injection Inject 100 Units into the skin at bedtime.      Marland Kitchen  Insulin Pen Needle (NOVOFINE) 30G X 8 MM MISC Use four times a day dx 250.00  100 each  5  . ketoconazole (NIZORAL) 2 % shampoo as directed.      Marland Kitchen KLOR-CON M20 20 MEQ tablet TAKE 1 TABLET BY MOUTH 2 TIMES A DAY  60 each  5  . latanoprost (XALATAN) 0.005 % ophthalmic solution as directed.      . lidocaine (LIDODERM) 5 % Place 1-3 patches onto the skin daily. Remove & Discard patch within 12 hours or as directed by MD      . losartan-hydrochlorothiazide (HYZAAR) 100-25 MG per tablet TAKE 1 TABLET BY MOUTH EVERY DAY FOR    BLOOD PRESSURE  30 tablet  5  . lovastatin (MEVACOR) 40 MG tablet TAKE 2 TABLETS BY MOUTH EVERY NIGHT     AT BEDTIME  60 tablet  5  . metFORMIN (GLUCOPHAGE-XR) 500 MG 24 hr tablet TAKE 2 TABLETS BY MOUTH                 2 TIMES DAILY  120 tablet  5  . methocarbamol (ROBAXIN) 500 MG tablet TAKE 1 TABLET BY MOUTH EVERY NIGHT AT   BEDTIME AS NEEDED FOR CRAMPS  30 tablet  5   . morphine (MS CONTIN) 15 MG 12 hr tablet Take 1 tablet (15 mg total) by mouth 2 (two) times daily.  60 tablet  0  . naproxen sodium (ANAPROX) 220 MG tablet 2 tablets by mouth once daily as needed for pain       . oxyCODONE-acetaminophen (PERCOCET) 7.5-500 MG per tablet Take 1 tablet by mouth every 8 (eight) hours as needed for pain. 1 tablet by mouth three times a day as needed  90 tablet  0  . polyethylene glycol powder (GLYCOLAX/MIRALAX) powder MIX AND TAKE 17GRAMS DAILY AS           PHYSICIAN INSTRUCTED  527 g  5  . ranitidine (ZANTAC) 150 MG tablet TAKE 1 TABLET BY MOUTH 2 TIMES A DAY    AS NEEDED HEARTBURN  60 tablet  5  . topiramate (TOPAMAX) 50 MG tablet Take 1 tablet (50 mg total) by mouth 2 (two) times daily.  60 tablet  1  . zolpidem (AMBIEN) 10 MG tablet Take 10 mg by mouth at bedtime as needed.        . testosterone (TESTIM) 50 MG/5GM GEL Place 5 g onto the skin daily.  5 g  5    Allergies  Allergen Reactions  . Neosporin (Neomycin-Polymyxin-Gramicidin)     Family History  Problem Relation Age of Onset  . Cancer Brother     Colon Cancer  . Heart disease Brother   . Heart disease Brother     BP 102/60  Pulse 91  Temp(Src) 97.4 F (36.3 C) (Oral)  Resp 18  Wt 352 lb (159.666 kg)  SpO2 91%    Review of Systems denies hypoglycemia    Objective:   Physical Exam VITAL SIGNS:  See vs page GENERAL: no distress.  Obese.  In wheelchair SKIN:  Insulin injection sites at the anterior abdomen are normal.    Lab Results  Component Value Date   HGBA1C 6.7* 10/27/2011      Assessment & Plan:  DM.  well-controlled

## 2011-10-29 ENCOUNTER — Encounter: Payer: Self-pay | Admitting: Endocrinology

## 2011-10-30 ENCOUNTER — Telehealth: Payer: Self-pay | Admitting: *Deleted

## 2011-10-30 NOTE — Telephone Encounter (Signed)
Called pt to inform of lab results, pt informed (letter also mailed to pt). 

## 2011-11-03 ENCOUNTER — Other Ambulatory Visit: Payer: Self-pay | Admitting: *Deleted

## 2011-11-03 ENCOUNTER — Encounter: Payer: Medicare Other | Attending: Neurosurgery | Admitting: Physical Medicine and Rehabilitation

## 2011-11-03 ENCOUNTER — Encounter: Payer: Self-pay | Admitting: Physical Medicine and Rehabilitation

## 2011-11-03 VITALS — BP 163/67 | HR 94 | Resp 18 | Ht 67.0 in | Wt 355.0 lb

## 2011-11-03 DIAGNOSIS — M545 Low back pain, unspecified: Secondary | ICD-10-CM

## 2011-11-03 DIAGNOSIS — IMO0002 Reserved for concepts with insufficient information to code with codable children: Secondary | ICD-10-CM

## 2011-11-03 DIAGNOSIS — M25561 Pain in right knee: Secondary | ICD-10-CM

## 2011-11-03 DIAGNOSIS — M549 Dorsalgia, unspecified: Secondary | ICD-10-CM | POA: Insufficient documentation

## 2011-11-03 DIAGNOSIS — M1710 Unilateral primary osteoarthritis, unspecified knee: Secondary | ICD-10-CM

## 2011-11-03 DIAGNOSIS — M1711 Unilateral primary osteoarthritis, right knee: Secondary | ICD-10-CM

## 2011-11-03 DIAGNOSIS — G8929 Other chronic pain: Secondary | ICD-10-CM

## 2011-11-03 DIAGNOSIS — M25569 Pain in unspecified knee: Secondary | ICD-10-CM

## 2011-11-03 DIAGNOSIS — I890 Lymphedema, not elsewhere classified: Secondary | ICD-10-CM

## 2011-11-03 MED ORDER — MORPHINE SULFATE ER 15 MG PO TBCR: 15.0000 mg | EXTENDED_RELEASE_TABLET | Freq: Two times a day (BID) | ORAL | Status: DC

## 2011-11-03 MED ORDER — OXYCODONE-ACETAMINOPHEN 7.5-500 MG PO TABS: 1.0000 | ORAL_TABLET | Freq: Three times a day (TID) | ORAL | Status: DC | PRN

## 2011-11-03 MED ORDER — TOPIRAMATE 50 MG PO TABS: 50.0000 mg | ORAL_TABLET | Freq: Two times a day (BID) | ORAL | Status: DC

## 2011-11-03 NOTE — Progress Notes (Signed)
Subjective:    Patient ID: Blake Summers, male    DOB: [birth year], 66 y.o.   MRN: [id number] Recent 4 days hospitalized in [facility]       66 year old gentleman with solid here in the center for pain and rebuild medicine for chronic pain complaints which are related to the low back and a right knee.     He needs refill in his medications today.    He needs another prescription for physical therapy as well   Maintain contact with his primary care doctor for his other medical problems           Pain Inventory Average Pain 8 Pain Right Now 8 My pain is constant, sharp, stabbing and aching  In the last 24 hours, has pain interfered with the following? General activity 9 Relation with others 9 Enjoyment of life 8 What TIME of day is your pain at its worst? daytime Sleep (in general) Poor  Pain is worse with: walking and standing Pain improves with: rest, medication and injections Relief from Meds: 6  Mobility walk with assistance use a cane use a walker how many minutes can you walk? 1 ability to climb steps?  yes do you drive?  yes use a wheelchair transfers alone  Function retired I need assistance with the following:  dressing, meal prep, household duties and shopping  Neuro/Psych numbness trouble walking dizziness  Prior Studies Any changes since last visit?  no  Physicians involved in your care Any changes since last visit?  no   Family History  Problem Relation Age of Onset  . Cancer Brother     Colon Cancer  . Heart disease Brother   . Heart disease Brother    History   Social History  . Marital Status: Married    Spouse Name: N/A    Number of Children: N/A  . Years of Education: N/A   Occupational History  . Retired     Nature conservation officer   Social History Main Topics  . Smoking status: Former Games developer  . Smokeless tobacco: None  . Alcohol Use: No  . Drug Use: No  . Sexually Active:    Other Topics Concern  . None    Social History Narrative   Diet is "good"Exercise is limited by medical problems   Past Surgical History  Procedure Date  . Lithotripsy 2007   Past Medical History  Diagnosis Date  . GERD 07/06/2010  . Edema 10/28/2007  . DIABETES MELLITUS, TYPE II 05/04/2007  . HYPERLIPIDEMIA 07/08/2008  . ALLERGIC RHINITIS 07/08/2008  . GLAUCOMA 07/08/2008  . CEREBROVASCULAR ACCIDENT, HX OF 07/08/2008  . OBSTRUCTIVE SLEEP APNEA 03/06/2008  . DIABETIC ULCER, LEFT LEG 08/23/2009  . 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 HYPERTENSION 10/28/2007  . VENOUS INSUFFICIENCY 03/08/2009  . FATTY LIVER DISEASE 05/19/2008  . BENIGN PROSTATIC HYPERTROPHY 07/08/2008  . 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 NEPHROLITHIASIS, HX OF 07/08/2008  . Morbid obesity   . DM nephropathy/sclerosis   . 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 Lumbar spondylosis    BP 163/67  Pulse 94  Resp 18  Ht 5\' 7"  (1.702 m)  Wt 355 lb (161.027 kg)  BMI 55.60 kg/m2  SpO2 92%   HPI    Review of Systems  Constitutional: Negative.   HENT: Negative.   Eyes: Negative.   Respiratory: Negative.   Cardiovascular: Positive for leg swelling.  Gastrointestinal: Negative.   Genitourinary: Negative.   Musculoskeletal: Positive for  back pain and arthralgias.  Skin: Positive for wound.  Neurological: Negative.   Hematological: Negative.   Psychiatric/Behavioral: Negative.        Objective:   Physical Exam  The patient is a morbidly obese gentleman    He is oriented x3. Speech is clear. Affect is bright. He is  alert, cooperative, and pleasant. Follows commands without difficulty.  Answers my questions appropriately.  NEUROLOGIC: Cranial nerves, coordination are intact. His reflexes are  diminished in the lower extremities. He has good strength in  both lower extremities at hip flexors, knee extensors, dorsiflexors and  plantar flexors.  Sensation is intact in the lower extremities. He is  noted to have  chronic swelling in lower extremities.  Right Knee  He has painful joint line over his right knee.(medial and lateral)Motion is preserved.  bilateral hip flexion contracture 5-10 degrees  Unable to appreciate any significant effusion due to excess soft tissue.  Antalgic gait, typically uses cane,  Decreased lumbar motion in all planes. Pain especially with extension unchanged from previous exam        Assessment & Plan:  1. End-stage right knee osteoarthritis. Can also try icy hot stick or balm), currently using lidoderm as well as knee sleeve, walker  2. Morbid obesity.  3. Generally deconditioned.  4. Chronic low back pain/spondylosis.  5. Chronic venous insufficiency/lymphedema.  6. Gait disorder multifactoral    Oxycodone 7.5/500 one per os 3 times a day    MS Contin 15 mg every 12 hours    The Opioid medication pill count was appropriate. No reported significant side effects of Opioid medications noted. No aberrant behavior noted. Patient cautioned regarding operation of machinery or vehicles. Patient understands risk and benefits of these medications. There is no indication or report of risk to self or others.

## 2011-11-03 NOTE — Patient Instructions (Signed)
Please keep your pain medications locked up and in a secure location  Followup at our pain and rehabilitation clinic next 2 months with physician assistant  Jaydalee Bardwell in 3 months

## 2011-11-06 ENCOUNTER — Other Ambulatory Visit: Payer: Self-pay | Admitting: Endocrinology

## 2011-11-07 ENCOUNTER — Other Ambulatory Visit: Payer: Self-pay | Admitting: *Deleted

## 2011-11-07 MED ORDER — DOCUSATE SODIUM 100 MG PO CAPS: 100.0000 mg | ORAL_CAPSULE | Freq: Two times a day (BID) | ORAL | Status: DC | PRN

## 2011-12-01 ENCOUNTER — Encounter: Payer: Self-pay | Admitting: Physical Medicine and Rehabilitation

## 2011-12-01 ENCOUNTER — Encounter
Payer: Medicare Other | Attending: Physical Medicine and Rehabilitation | Admitting: Physical Medicine and Rehabilitation

## 2011-12-01 VITALS — BP 143/58 | HR 93 | Resp 18 | Ht 67.0 in | Wt 355.0 lb

## 2011-12-01 DIAGNOSIS — G8929 Other chronic pain: Secondary | ICD-10-CM | POA: Insufficient documentation

## 2011-12-01 DIAGNOSIS — M1710 Unilateral primary osteoarthritis, unspecified knee: Secondary | ICD-10-CM | POA: Insufficient documentation

## 2011-12-01 DIAGNOSIS — I890 Lymphedema, not elsewhere classified: Secondary | ICD-10-CM | POA: Insufficient documentation

## 2011-12-01 DIAGNOSIS — M545 Low back pain, unspecified: Secondary | ICD-10-CM | POA: Insufficient documentation

## 2011-12-01 DIAGNOSIS — M47817 Spondylosis without myelopathy or radiculopathy, lumbosacral region: Secondary | ICD-10-CM | POA: Insufficient documentation

## 2011-12-01 DIAGNOSIS — M25569 Pain in unspecified knee: Secondary | ICD-10-CM | POA: Insufficient documentation

## 2011-12-01 MED ORDER — OXYCODONE-ACETAMINOPHEN 7.5-500 MG PO TABS: 1.0000 | ORAL_TABLET | Freq: Three times a day (TID) | ORAL | Status: DC | PRN

## 2011-12-01 MED ORDER — MORPHINE SULFATE ER 15 MG PO TBCR: 15.0000 mg | EXTENDED_RELEASE_TABLET | Freq: Two times a day (BID) | ORAL | Status: DC

## 2011-12-01 NOTE — Progress Notes (Signed)
Subjective:    Patient ID: Blake Summers, male    DOB: September 03, 1945, 66 y.o.   MRN: [id number]  HPI The patient complains about chronic right knee pain.  The patient denies any radiation. The patient also complains about an open wound on his left lower leg, which is treated by the wound care center. n The knee problem has been stable, his open wound is healing. The patient had to interrupt his physical therapy in the water because of the open wound.  Pain Inventory Average Pain 8 Pain Right Now 8 My pain is intermittent, sharp, stabbing, tingling and aching  In the last 24 hours, has pain interfered with the following? General activity 9 Relation with others 9 Enjoyment of life 8 What TIME of day is your pain at its worst? daytime Sleep (in general) Poor  Pain is worse with: walking and standing Pain improves with: rest, medication and injections Relief from Meds: 6  Mobility walk with assistance use a cane use a walker how many minutes can you walk? 3-4 ability to climb steps?  yes do you drive?  yes  Function retired I need assistance with the following:  dressing, meal prep, household duties and shopping  Neuro/Psych numbness tingling trouble walking dizziness  Prior Studies Any changes since last visit?  no  Physicians involved in your care Any changes since last visit?  no   Family History  Problem Relation Age of Onset  . Cancer Brother     Colon Cancer  . Heart disease Brother   . Heart disease Brother    History   Social History  . Marital Status: Married    Spouse Name: N/A    Number of Children: N/A  . Years of Education: N/A   Occupational History  . Retired     Nature conservation officer   Social History Main Topics  . Smoking status: Former Games developer  . Smokeless tobacco: None  . Alcohol Use: No  . Drug Use: No  . Sexually Active:    Other Topics Concern  . None   Social History Narrative   Diet is "good"Exercise is limited by medical  problems   Past Surgical History  Procedure Date  . Lithotripsy 2007   Past Medical History  Diagnosis Date  . GERD 07/06/2010  . Edema 10/28/2007  . DIABETES MELLITUS, TYPE II 05/04/2007  . HYPERLIPIDEMIA 07/08/2008  . ALLERGIC RHINITIS 07/08/2008  . GLAUCOMA 07/08/2008  . CEREBROVASCULAR ACCIDENT, HX OF 07/08/2008  . OBSTRUCTIVE SLEEP APNEA 03/06/2008  . DIABETIC ULCER, LEFT LEG 08/23/2009  . 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 HYPERTENSION 10/28/2007  . VENOUS INSUFFICIENCY 03/08/2009  . FATTY LIVER DISEASE 05/19/2008  . BENIGN PROSTATIC HYPERTROPHY 07/08/2008  . 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 NEPHROLITHIASIS, HX OF 07/08/2008  . Morbid obesity   . DM nephropathy/sclerosis   . 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 Lumbar spondylosis    BP 143/58  Pulse 93  Resp 18  Ht 5\' 7"  (1.702 m)  Wt 355 lb (161.027 kg)  BMI 55.60 kg/m2  SpO2 92%    Review of Systems  Musculoskeletal: Positive for gait problem.  Neurological: Positive for dizziness and numbness.       Tingling  All other systems reviewed and are negative.       Objective:   Physical Exam The patient is a morbidly obese gentleman  He is oriented x3. Speech is clear. Affect is bright. He is  alert, cooperative,  and pleasant. Follows commands without difficulty.  Answers my questions appropriately.  NEUROLOGIC: Cranial nerves, coordination are intact. His reflexes are  diminished in the lower extremities. He has good strength in  both lower extremities at hip flexors, knee extensors, dorsiflexors and  plantar flexors.  Sensation is intact in the lower extremities. He is  noted to have chronic swelling in lower extremities.  Right Knee  He has painful joint line over his right knee.(medial and lateral)Motion is preserved.  bilateral hip flexion contracture 5-10 degrees         Assessment & Plan:  1. End-stage right knee osteoarthritis. Can also try icy hot stick or balm),  currently using lidoderm as well as knee sleeve, walker  2. Morbid obesity.  3. Generally deconditioned.  4. Chronic low back pain/spondylosis.  5. Chronic venous insufficiency/lymphedema.  Continue following up with the wound care center for his open wound on the left lower leg. Try to go back to aquatic physical therapy when the wound has healed. Filled his medication today.

## 2011-12-01 NOTE — Patient Instructions (Signed)
Continue with medication, continue follow up with wound center, continue moving your knees.

## 2011-12-06 ENCOUNTER — Other Ambulatory Visit: Payer: Self-pay | Admitting: Endocrinology

## 2011-12-07 ENCOUNTER — Other Ambulatory Visit: Payer: Self-pay | Admitting: *Deleted

## 2011-12-07 MED ORDER — LATANOPROST 0.005 % OP SOLN: 1.0000 [drp] | Freq: Every day | OPHTHALMIC | Status: DC

## 2011-12-07 MED ORDER — TOPIRAMATE 50 MG PO TABS: 50.0000 mg | ORAL_TABLET | Freq: Two times a day (BID) | ORAL | Status: DC

## 2011-12-28 ENCOUNTER — Encounter
Payer: Medicare Other | Attending: Physical Medicine and Rehabilitation | Admitting: Physical Medicine and Rehabilitation

## 2011-12-28 ENCOUNTER — Encounter: Payer: Self-pay | Admitting: Physical Medicine and Rehabilitation

## 2011-12-28 VITALS — BP 169/57 | HR 93 | Resp 16 | Ht 66.0 in | Wt 360.0 lb

## 2011-12-28 DIAGNOSIS — M545 Low back pain, unspecified: Secondary | ICD-10-CM | POA: Insufficient documentation

## 2011-12-28 DIAGNOSIS — M1710 Unilateral primary osteoarthritis, unspecified knee: Secondary | ICD-10-CM | POA: Insufficient documentation

## 2011-12-28 DIAGNOSIS — I872 Venous insufficiency (chronic) (peripheral): Secondary | ICD-10-CM | POA: Insufficient documentation

## 2011-12-28 DIAGNOSIS — G8929 Other chronic pain: Secondary | ICD-10-CM | POA: Insufficient documentation

## 2011-12-28 DIAGNOSIS — M47817 Spondylosis without myelopathy or radiculopathy, lumbosacral region: Secondary | ICD-10-CM | POA: Insufficient documentation

## 2011-12-28 DIAGNOSIS — M25569 Pain in unspecified knee: Secondary | ICD-10-CM

## 2011-12-28 DIAGNOSIS — I890 Lymphedema, not elsewhere classified: Secondary | ICD-10-CM | POA: Insufficient documentation

## 2011-12-28 DIAGNOSIS — M179 Osteoarthritis of knee, unspecified: Secondary | ICD-10-CM

## 2011-12-28 MED ORDER — OXYCODONE-ACETAMINOPHEN 7.5-500 MG PO TABS: 1.0000 | ORAL_TABLET | Freq: Three times a day (TID) | ORAL | Status: DC | PRN

## 2011-12-28 MED ORDER — MORPHINE SULFATE ER 15 MG PO TBCR: 15.0000 mg | EXTENDED_RELEASE_TABLET | Freq: Two times a day (BID) | ORAL | Status: DC

## 2011-12-28 MED ORDER — DICLOFENAC SODIUM 1 % TD GEL: 1.0000 "application " | Freq: Four times a day (QID) | TRANSDERMAL | Status: DC

## 2011-12-28 NOTE — Patient Instructions (Signed)
Continue to follow up with wound care for your open sore, advised to restart water aerobics, when sore has healed completely.

## 2011-12-28 NOTE — Progress Notes (Signed)
Subjective:    Patient ID: Blake Summers, male    DOB: 1945/12/03, 66 y.o.   MRN: [id number]  HPI The patient complains about chronic right knee pain. The patient denies any radiation. The patient also complains about an open wound on his left lower leg, which is treated by the wound care center.  The knee problem has been stable, his open wound is healing. The patient had to interrupt his physical therapy in the water because of the open wound.  Pain Inventory Average Pain 7 Pain Right Now 6 My pain is sharp and stabbing  In the last 24 hours, has pain interfered with the following? General activity 8 Relation with others 8 Enjoyment of life 8 What TIME of day is your pain at its worst? daytime Sleep (in general) Poor  Pain is worse with: walking and standing Pain improves with: rest, medication and injections Relief from Meds: 6  Mobility walk with assistance use a cane use a walker ability to climb steps?  yes do you drive?  yes use a wheelchair transfers alone  Function retired I need assistance with the following:  dressing, bathing, meal prep, household duties and shopping  Neuro/Psych weakness numbness tingling trouble walking dizziness  Prior Studies Any changes since last visit?  no  Physicians involved in your care Any changes since last visit?  no   Family History  Problem Relation Age of Onset  . Cancer Brother     Colon Cancer  . Heart disease Brother   . Heart disease Brother    History   Social History  . Marital Status: Married    Spouse Name: N/A    Number of Children: N/A  . Years of Education: N/A   Occupational History  . Retired     Nature conservation officer   Social History Main Topics  . Smoking status: Former Games developer  . Smokeless tobacco: None  . Alcohol Use: No  . Drug Use: No  . Sexually Active:    Other Topics Concern  . None   Social History Narrative   Diet is "good"Exercise is limited by medical problems   Past  Surgical History  Procedure Date  . Lithotripsy 2007   Past Medical History  Diagnosis Date  . GERD 07/06/2010  . Edema 10/28/2007  . DIABETES MELLITUS, TYPE II 05/04/2007  . HYPERLIPIDEMIA 07/08/2008  . ALLERGIC RHINITIS 07/08/2008  . GLAUCOMA 07/08/2008  . CEREBROVASCULAR ACCIDENT, HX OF 07/08/2008  . OBSTRUCTIVE SLEEP APNEA 03/06/2008  . DIABETIC ULCER, LEFT LEG 08/23/2009  . 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 HYPERTENSION 10/28/2007  . VENOUS INSUFFICIENCY 03/08/2009  . FATTY LIVER DISEASE 05/19/2008  . BENIGN PROSTATIC HYPERTROPHY 07/08/2008  . 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 NEPHROLITHIASIS, HX OF 07/08/2008  . Morbid obesity   . DM nephropathy/sclerosis   . 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 Lumbar spondylosis    BP 169/57  Pulse 93  Resp 16  Ht 5\' 6"  (1.676 m)  Wt 360 lb (163.295 kg)  BMI 58.11 kg/m2  SpO2 87%     Review of Systems  Musculoskeletal: Positive for back pain and gait problem.  Neurological: Positive for dizziness, weakness and numbness.  All other systems reviewed and are negative.       Objective:   Physical Exam The patient is a morbidly obese gentleman  He is oriented x3. Speech is clear. Affect is bright. He is  alert, cooperative, and pleasant. Follows commands without difficulty.  Answers my questions appropriately.  NEUROLOGIC: Cranial nerves, coordination are intact. His reflexes are  diminished in the lower extremities. He has good strength in  both lower extremities at hip flexors, knee extensors, dorsiflexors and  plantar flexors.  Sensation is intact in the lower extremities. He is  noted to have chronic swelling in lower extremities.  Right Knee  He has painful joint line over his right knee.(medial and lateral)Motion is preserved.  bilateral hip flexion contracture 5-10 degrees Symmetric normal motor tone is noted throughout. Normal muscle bulk. Muscle testing reveals 5/5 muscle strength of the upper  extremity, and 5/5 of the lower extremity. Full range of motion in upper and lower extremities. ROM of spine is not restricted. Fine motor movements are normal in both hands. Sensory is intact and symmetric to light touch, pinprick and proprioception. DTR in the upper and lower extremity are present and symmetric 2+. No clonus is noted.  Patient arises from chair without difficulty. Narrow based gait with normal arm swing bilateral , able to walk on heels and toes . Tandem walk is stable. No pronator drift. Rhomberg negative. Good finger to nose and heel to shin testing. No tremor, dystaxia or dysmetria noted.          Assessment & Plan:  1. End-stage right knee osteoarthritis. Prescribed Voltaren gel, currently using lidoderm as well as knee sleeve, walker  2. Morbid obesity.  3. Generally deconditioned.  4. Chronic low back pain/spondylosis.  5. Chronic venous insufficiency/lymphedema.  Continue following up with the wound care center for his open wound on the left lower leg. Try to go back to aquatic physical therapy when the wound has healed.  Filled his medication today.

## 2012-01-01 ENCOUNTER — Telehealth: Payer: Self-pay | Admitting: Pulmonary Disease

## 2012-01-01 NOTE — Telephone Encounter (Signed)
Called, spoke with pt.  He c/o having difficulty breathing with any exertion.  He sees Dr. Shelle Iron for sleep - last seen 05/2009.  We have scheduled him to see Dr. Sherene Sires as a pulmonary consult on tomorrow, January 02, 2012 at 2 pm.  Pt is aware to arrive at 1:45 pm and was encouraged all meds with him to visit. He verbalized understanding of these instructions and is aware of office location.

## 2012-01-02 ENCOUNTER — Encounter: Payer: Self-pay | Admitting: Internal Medicine

## 2012-01-02 ENCOUNTER — Telehealth: Payer: Self-pay | Admitting: Internal Medicine

## 2012-01-02 ENCOUNTER — Ambulatory Visit (INDEPENDENT_AMBULATORY_CARE_PROVIDER_SITE_OTHER): Payer: Medicare Other | Admitting: Internal Medicine

## 2012-01-02 ENCOUNTER — Other Ambulatory Visit (INDEPENDENT_AMBULATORY_CARE_PROVIDER_SITE_OTHER): Payer: Medicare Other

## 2012-01-02 VITALS — BP 138/58 | HR 89 | Temp 97.9°F | Ht 66.0 in | Wt 347.0 lb

## 2012-01-02 DIAGNOSIS — R0989 Other specified symptoms and signs involving the circulatory and respiratory systems: Secondary | ICD-10-CM

## 2012-01-02 DIAGNOSIS — J9611 Chronic respiratory failure with hypoxia: Secondary | ICD-10-CM | POA: Insufficient documentation

## 2012-01-02 DIAGNOSIS — R609 Edema, unspecified: Secondary | ICD-10-CM

## 2012-01-02 DIAGNOSIS — R0609 Other forms of dyspnea: Secondary | ICD-10-CM

## 2012-01-02 DIAGNOSIS — R0600 Dyspnea, unspecified: Secondary | ICD-10-CM

## 2012-01-02 DIAGNOSIS — J9610 Chronic respiratory failure, unspecified whether with hypoxia or hypercapnia: Secondary | ICD-10-CM

## 2012-01-02 LAB — CBC WITH DIFFERENTIAL/PLATELET
Basophils Relative: 0.3 % (ref 0.0–3.0)
Eosinophils Absolute: 0.4 10*3/uL (ref 0.0–0.7)
Eosinophils Relative: 3.2 % (ref 0.0–5.0)
Lymphocytes Relative: 8.1 % — ABNORMAL LOW (ref 12.0–46.0)
MCV: 92.5 fl (ref 78.0–100.0)
Monocytes Absolute: 1.1 10*3/uL — ABNORMAL HIGH (ref 0.1–1.0)
Neutrophils Relative %: 80.5 % — ABNORMAL HIGH (ref 43.0–77.0)
Platelets: 311 10*3/uL (ref 150.0–400.0)
RBC: 4.54 Mil/uL (ref 4.22–5.81)
WBC: 13.4 10*3/uL — ABNORMAL HIGH (ref 4.5–10.5)

## 2012-01-02 LAB — HEPATIC FUNCTION PANEL
ALT: 29 U/L (ref 0–53)
Alkaline Phosphatase: 84 U/L (ref 39–117)
Bilirubin, Direct: 0.2 mg/dL (ref 0.0–0.3)
Total Bilirubin: 0.8 mg/dL (ref 0.3–1.2)

## 2012-01-02 LAB — BASIC METABOLIC PANEL
CO2: 37 mEq/L — ABNORMAL HIGH (ref 19–32)
Calcium: 9.1 mg/dL (ref 8.4–10.5)
Creatinine, Ser: 1.1 mg/dL (ref 0.4–1.5)
GFR: 74.08 mL/min (ref >60.00)
Sodium: 140 mEq/L (ref 135–145)

## 2012-01-02 LAB — BRAIN NATRIURETIC PEPTIDE: Pro B Natriuretic peptide (BNP): 97 pg/mL (ref 0.0–100.0)

## 2012-01-02 LAB — TSH: TSH: 1.02 u[IU]/mL (ref 0.35–5.50)

## 2012-01-02 MED ORDER — RANITIDINE HCL 150 MG PO TABS: ORAL_TABLET | ORAL | Status: DC

## 2012-01-02 MED ORDER — DEXLANSOPRAZOLE 60 MG PO CPDR: 60.0000 mg | DELAYED_RELEASE_CAPSULE | Freq: Every day | ORAL | Status: DC

## 2012-01-02 NOTE — Progress Notes (Signed)
  Subjective:    Patient ID: Blake Summers, male    DOB: 11-08-1945   MRN: [id number]  HPI  8 yowm quit smoking 20 years ago followed by Shelle Iron for osa on cpap (last seen 2010) but no 02 able to transfer with help of either lift person before sob x years but worse x 3-4 days assoc with sore throat  and dysphagia worse than usual on naproxen 2 daily for arthritis and zantac p bfast and after supper, cough is constant with hoarsess.  Has had increased orthopnea and pnd x 3 months but worse x 3 days.leg swelling is baseline, chronic.  Cough is mostly dry. No assoc nasal symptoms or overt hb.   Sleeping ok without nocturnal  or early am exacerbation  of respiratory  c/o's or need for noct saba. Also denies any obvious fluctuation of symptoms with weather or environmental changes or other aggravating or alleviating factors except as outlined above    Review of Systems  Constitutional: Negative for fever, chills, activity change, appetite change and unexpected weight change.  HENT: Positive for sore throat and trouble swallowing. Negative for congestion, rhinorrhea, sneezing, dental problem, voice change and postnasal drip.   Eyes: Negative for visual disturbance.  Respiratory: Positive for cough, chest tightness and shortness of breath. Negative for choking.   Cardiovascular: Positive for leg swelling. Negative for chest pain.  Gastrointestinal: Negative for nausea, vomiting and abdominal pain.  Genitourinary: Negative for difficulty urinating.  Musculoskeletal: Negative for arthralgias.  Skin: Negative for rash.  Psychiatric/Behavioral: Negative for behavioral problems and confusion.       Objective:   Physical Exam   Obese wm nad Wt Readings from Last 3 Encounters:  01/02/12 347 lb (157.398 kg)  12/28/11 360 lb (163.295 kg)  12/01/11 355 lb (161.027 kg)     HEENT: nl dentition, turbinates, and orophanx. Nl external ear canals without cough reflex   NECK :  without JVD/Nodes/TM/  nl carotid upstrokes bilaterally   LUNGS: no acc muscle use, clear to A and P bilaterally without cough on insp or exp maneuvers   CV:  RRR  no s3 or murmur or increase in P2, no edema   ABD: massively obese soft and nontender with   MS:  warm without deformities, calf tenderness, cyanosis or clubbing  SKIN: warm and dry without lesions    NEURO:  alert, approp, no deficits   cxr requested 7/23 > not done    Assessment & Plan:

## 2012-01-02 NOTE — Patient Instructions (Addendum)
Try dexilant 60 (prilosec 20mg  x2) Take 30-60 min before first meal of the day and Zantac 150 2 @  bedtime until return  GERD (REFLUX)  is an extremely common cause of respiratory symptoms, many times with no significant heartburn at all.    It can be treated with medication, but also with lifestyle changes including avoidance of late meals, excessive alcohol, smoking cessation, and avoid fatty foods, chocolate, peppermint, colas, red wine, and acidic juices such as orange juice.  NO MINT OR MENTHOL PRODUCTS SO NO COUGH DROPS  USE SUGARLESS CANDY INSTEAD (jolley ranchers or Stover's)  NO OIL BASED VITAMINS - use powdered substitutes.   Please remember to go to the lab and x-ray department downstairs for your tests - we will call you with the results when they are available.  Please see patient coordinator before you leave today  to schedule 24 h 02 2 at 2lpm  Keep appt to see Dr Shelle Iron - call sooner if worse breathing or headache

## 2012-01-02 NOTE — Telephone Encounter (Signed)
ATC [facility] and was on hold x 8 minutes adn never was able to reach anyone wcb

## 2012-01-03 ENCOUNTER — Telehealth: Payer: Self-pay | Admitting: Internal Medicine

## 2012-01-03 MED ORDER — POTASSIUM CHLORIDE CRYS ER 20 MEQ PO TBCR: EXTENDED_RELEASE_TABLET | ORAL | Status: DC

## 2012-01-03 NOTE — Assessment & Plan Note (Signed)
Acute on chronic and likely directly related to wt gain with no evidence of chf

## 2012-01-03 NOTE — Telephone Encounter (Signed)
Called and spoke with pt. He states that he is needing new rx for K sent to pharmacy since he was told to double his dose based upon labs done 01-02-12. Rx was sent to pharm and pt states nothing further needed.

## 2012-01-03 NOTE — Telephone Encounter (Signed)
Per [facility], this was taken care of yesterday, 01/02/12 and nothing further is needed. Per Verlon Au, she spoke with the pt earlier today and his oxygen has been delivered.

## 2012-01-04 NOTE — Assessment & Plan Note (Addendum)
-   HC03 37 01/02/12 with sat74% at rest corrected on 2lpm  TSH and bnp are wnl today He  appears to have classic ohs superimposed on obesity/ osa that he's never been able to address and may need to be transitioned over to bipap > referred back to Dr Shelle Iron for this

## 2012-01-05 ENCOUNTER — Other Ambulatory Visit: Payer: Self-pay | Admitting: Endocrinology

## 2012-01-08 ENCOUNTER — Telehealth: Payer: Self-pay | Admitting: Endocrinology

## 2012-01-08 DIAGNOSIS — Z8679 Personal history of other diseases of the circulatory system: Secondary | ICD-10-CM

## 2012-01-08 NOTE — Telephone Encounter (Signed)
Pt informed of referral

## 2012-01-08 NOTE — Telephone Encounter (Signed)
i send referral

## 2012-01-11 ENCOUNTER — Telehealth: Payer: Self-pay | Admitting: Pulmonary Disease

## 2012-01-11 DIAGNOSIS — G4733 Obstructive sleep apnea (adult) (pediatric): Secondary | ICD-10-CM

## 2012-01-11 NOTE — Telephone Encounter (Signed)
Spoke with pt and advised will send order to [facility] for o2 to be bled into CPAP. I advised needs to keep ov with [facility] for 02-02-12 and the pt verbalized understanding and states will keep ov. Nothing further needed.

## 2012-01-11 NOTE — Telephone Encounter (Signed)
Spoke with pt. He states that he has been using CPAP for years, but recently started on o2 24/7. He needs to know if he needs to have the o2 bled into CPAP. If so needs order sent to [facility] for this. MW, please advise, thanks!

## 2012-01-11 NOTE — Telephone Encounter (Signed)
Yes needs 02 bled into cpap and make sure he has f/u with KC to sort out cpap needs

## 2012-01-25 ENCOUNTER — Encounter: Payer: Self-pay | Admitting: Physical Medicine and Rehabilitation

## 2012-01-25 ENCOUNTER — Encounter
Payer: Medicare Other | Attending: Physical Medicine and Rehabilitation | Admitting: Physical Medicine and Rehabilitation

## 2012-01-25 VITALS — BP 142/50 | HR 91 | Resp 14 | Ht 66.0 in | Wt 347.0 lb

## 2012-01-25 DIAGNOSIS — M47817 Spondylosis without myelopathy or radiculopathy, lumbosacral region: Secondary | ICD-10-CM | POA: Insufficient documentation

## 2012-01-25 DIAGNOSIS — I10 Essential (primary) hypertension: Secondary | ICD-10-CM | POA: Insufficient documentation

## 2012-01-25 DIAGNOSIS — G4733 Obstructive sleep apnea (adult) (pediatric): Secondary | ICD-10-CM | POA: Insufficient documentation

## 2012-01-25 DIAGNOSIS — K219 Gastro-esophageal reflux disease without esophagitis: Secondary | ICD-10-CM | POA: Insufficient documentation

## 2012-01-25 DIAGNOSIS — I872 Venous insufficiency (chronic) (peripheral): Secondary | ICD-10-CM | POA: Insufficient documentation

## 2012-01-25 DIAGNOSIS — S81809A Unspecified open wound, unspecified lower leg, initial encounter: Secondary | ICD-10-CM | POA: Insufficient documentation

## 2012-01-25 DIAGNOSIS — E1129 Type 2 diabetes mellitus with other diabetic kidney complication: Secondary | ICD-10-CM | POA: Insufficient documentation

## 2012-01-25 DIAGNOSIS — G8929 Other chronic pain: Secondary | ICD-10-CM | POA: Insufficient documentation

## 2012-01-25 DIAGNOSIS — M545 Low back pain, unspecified: Secondary | ICD-10-CM | POA: Insufficient documentation

## 2012-01-25 DIAGNOSIS — Z8673 Personal history of transient ischemic attack (TIA), and cerebral infarction without residual deficits: Secondary | ICD-10-CM | POA: Insufficient documentation

## 2012-01-25 DIAGNOSIS — M25569 Pain in unspecified knee: Secondary | ICD-10-CM | POA: Insufficient documentation

## 2012-01-25 DIAGNOSIS — M1710 Unilateral primary osteoarthritis, unspecified knee: Secondary | ICD-10-CM | POA: Insufficient documentation

## 2012-01-25 DIAGNOSIS — E785 Hyperlipidemia, unspecified: Secondary | ICD-10-CM | POA: Insufficient documentation

## 2012-01-25 DIAGNOSIS — N058 Unspecified nephritic syndrome with other morphologic changes: Secondary | ICD-10-CM | POA: Insufficient documentation

## 2012-01-25 DIAGNOSIS — Z87891 Personal history of nicotine dependence: Secondary | ICD-10-CM | POA: Insufficient documentation

## 2012-01-25 DIAGNOSIS — R5381 Other malaise: Secondary | ICD-10-CM | POA: Insufficient documentation

## 2012-01-25 DIAGNOSIS — M1711 Unilateral primary osteoarthritis, right knee: Secondary | ICD-10-CM

## 2012-01-25 DIAGNOSIS — X58XXXA Exposure to other specified factors, initial encounter: Secondary | ICD-10-CM | POA: Insufficient documentation

## 2012-01-25 DIAGNOSIS — R0602 Shortness of breath: Secondary | ICD-10-CM | POA: Insufficient documentation

## 2012-01-25 DIAGNOSIS — Z9981 Dependence on supplemental oxygen: Secondary | ICD-10-CM | POA: Insufficient documentation

## 2012-01-25 DIAGNOSIS — I890 Lymphedema, not elsewhere classified: Secondary | ICD-10-CM | POA: Insufficient documentation

## 2012-01-25 MED ORDER — OXYCODONE-ACETAMINOPHEN 7.5-500 MG PO TABS: 1.0000 | ORAL_TABLET | Freq: Three times a day (TID) | ORAL | Status: DC | PRN

## 2012-01-25 MED ORDER — MORPHINE SULFATE ER 15 MG PO TBCR: 15.0000 mg | EXTENDED_RELEASE_TABLET | Freq: Two times a day (BID) | ORAL | Status: DC

## 2012-01-25 NOTE — Progress Notes (Signed)
Subjective:    Patient ID: Blake Summers, male    DOB: November 20, 1945, 66 y.o.   MRN: [id number]  HPI The patient complains about chronic right knee pain. The patient denies any radiation. The patient also complains about an open wound on his left lower leg, which is treated by the wound care center.  The knee problem has been stable, his open wound is healing. The patient had to interrupt his physical therapy in the water because of the open wound. The patient states, that he had difficulties breathing, he saw his pulmonologist, and is now on O2.  Pain Inventory Average Pain 8 Pain Right Now 6 My pain is sharp, stabbing, tingling and aching  In the last 24 hours, has pain interfered with the following? General activity 9 Relation with others 9 Enjoyment of life 8 What TIME of day is your pain at its worst? daytime Sleep (in general) Poor  Pain is worse with: walking and standing Pain improves with: rest, medication and injections Relief from Meds: 6  Mobility walk with assistance use a cane use a walker ability to climb steps?  yes do you drive?  yes use a wheelchair needs help with transfers  Function retired I need assistance with the following:  dressing, meal prep, household duties and shopping  Neuro/Psych numbness tingling trouble walking dizziness  Prior Studies Any changes since last visit?  no  Physicians involved in your care Any changes since last visit?  no   Family History  Problem Relation Age of Onset  . Cancer Brother     Colon Cancer  . Heart disease Brother   . Heart disease Brother   . Asthma Mother    History   Social History  . Marital Status: Married    Spouse Name: N/A    Number of Children: N/A  . Years of Education: N/A   Occupational History  . Retired     Nature conservation officer   Social History Main Topics  . Smoking status: Former Smoker -- 0.5 packs/day for 15 years    Types: Cigarettes    Quit date: 06/12/1988  .  Smokeless tobacco: Never Used  . Alcohol Use: No  . Drug Use: No  . Sexually Active: None   Other Topics Concern  . None   Social History Narrative   Diet is "good"Exercise is limited by medical problems   Past Surgical History  Procedure Date  . Lithotripsy 2007   Past Medical History  Diagnosis Date  . GERD 07/06/2010  . Edema 10/28/2007  . DIABETES MELLITUS, TYPE II 05/04/2007  . HYPERLIPIDEMIA 07/08/2008  . ALLERGIC RHINITIS 07/08/2008  . GLAUCOMA 07/08/2008  . CEREBROVASCULAR ACCIDENT, HX OF 07/08/2008  . OBSTRUCTIVE SLEEP APNEA 03/06/2008  . DIABETIC ULCER, LEFT LEG 08/23/2009  . 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 HYPERTENSION 10/28/2007  . VENOUS INSUFFICIENCY 03/08/2009  . FATTY LIVER DISEASE 05/19/2008  . BENIGN PROSTATIC HYPERTROPHY 07/08/2008  . 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 NEPHROLITHIASIS, HX OF 07/08/2008  . Morbid obesity   . DM nephropathy/sclerosis   . 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 Lumbar spondylosis    BP 142/50  Pulse 91  Resp 14  Ht 5\' 6"  (1.676 m)  Wt 347 lb (157.398 kg)  BMI 56.01 kg/m2  SpO2 93%     Review of Systems  Musculoskeletal: Positive for myalgias, back pain, arthralgias and gait problem.  Neurological: Positive for numbness.  All other systems reviewed and are  negative.       Objective:   Physical Exam The patient is a morbidly obese gentleman  He is oriented x3. Speech is clear. Affect is bright. He is  alert, cooperative, and pleasant. Follows commands without difficulty.  Answers my questions appropriately.  NEUROLOGIC: Cranial nerves, coordination are intact. His reflexes are  diminished in the lower extremities. He has good strength in  both lower extremities at hip flexors, knee extensors, dorsiflexors and  plantar flexors.  Sensation is intact in the lower extremities. He is  noted to have chronic swelling in lower extremities.  Right Knee  He has painful joint line over his right  knee.(medial and lateral)Motion is preserved.  bilateral hip flexion contracture 5-10 degrees  Symmetric normal motor tone is noted throughout. Normal muscle bulk. Muscle testing reveals 5/5 muscle strength of the upper extremity, and 5/5 of the lower extremity.  DTR in the upper and lower extremity are present and symmetric 2+. No clonus is noted.  Patient is in a wheel chair.         Assessment & Plan:  1. End-stage right knee osteoarthritis. Prescribed Voltaren gel, currently using lidoderm as well as knee sleeve, walker  2. Morbid obesity.  3. Generally deconditioned.  4. Chronic low back pain/spondylosis.  5. Chronic venous insufficiency/lymphedema.  6. SOB, O2 dependent Continue following up with the wound care center for his open wound on the left lower leg. Try to go back to aquatic physical therapy when the wound has healed.Advised patient to do exercises at home, which were shown to him by home health.  Filled his medication today. Follow up in 1 month

## 2012-01-25 NOTE — Patient Instructions (Signed)
Try to stay as active as possible, follow up with wound care.

## 2012-01-27 DIAGNOSIS — M1710 Unilateral primary osteoarthritis, unspecified knee: Secondary | ICD-10-CM

## 2012-01-27 DIAGNOSIS — E119 Type 2 diabetes mellitus without complications: Secondary | ICD-10-CM

## 2012-01-27 DIAGNOSIS — I872 Venous insufficiency (chronic) (peripheral): Secondary | ICD-10-CM

## 2012-01-27 DIAGNOSIS — M545 Low back pain: Secondary | ICD-10-CM

## 2012-02-02 ENCOUNTER — Inpatient Hospital Stay (HOSPITAL_COMMUNITY): Admission: RE | Admit: 2012-02-02 | Payer: Medicare Other | Source: Ambulatory Visit

## 2012-02-02 ENCOUNTER — Encounter: Payer: Self-pay | Admitting: Pulmonary Disease

## 2012-02-02 ENCOUNTER — Ambulatory Visit (INDEPENDENT_AMBULATORY_CARE_PROVIDER_SITE_OTHER): Payer: Medicare Other | Admitting: Pulmonary Disease

## 2012-02-02 ENCOUNTER — Ambulatory Visit (HOSPITAL_COMMUNITY)
Admission: RE | Admit: 2012-02-02 | Discharge: 2012-02-02 | Disposition: A | Discharge status: Home / Self Care | Payer: Medicare Other | Source: Ambulatory Visit | Attending: Pulmonary Disease | Admitting: Pulmonary Disease

## 2012-02-02 VITALS — BP 152/80 | HR 89 | Temp 98.1°F | Ht 67.0 in | Wt 353.2 lb

## 2012-02-02 DIAGNOSIS — R0602 Shortness of breath: Secondary | ICD-10-CM | POA: Insufficient documentation

## 2012-02-02 DIAGNOSIS — R51 Headache: Secondary | ICD-10-CM | POA: Insufficient documentation

## 2012-02-02 DIAGNOSIS — G4733 Obstructive sleep apnea (adult) (pediatric): Secondary | ICD-10-CM | POA: Insufficient documentation

## 2012-02-02 DIAGNOSIS — E878 Other disorders of electrolyte and fluid balance, not elsewhere classified: Secondary | ICD-10-CM | POA: Insufficient documentation

## 2012-02-02 DIAGNOSIS — R062 Wheezing: Secondary | ICD-10-CM | POA: Insufficient documentation

## 2012-02-02 DIAGNOSIS — M7989 Other specified soft tissue disorders: Secondary | ICD-10-CM | POA: Insufficient documentation

## 2012-02-02 DIAGNOSIS — E662 Morbid (severe) obesity with alveolar hypoventilation: Secondary | ICD-10-CM

## 2012-02-02 NOTE — Progress Notes (Signed)
  Subjective:    Patient ID: Blake Summers, male    DOB: [birth year], 66 y.o.   MRN: [id number]  HPI Patient comes in today for followup of his known obstructive sleep apnea.  He has not been seen in over 3 years, and has been noted to have a rising serum bicarbonate level suggestive of obesity hypoventilation.  The patient has been compliant with CPAP, but has not kept up with his mask changes.  He does not feel rested in the mornings upon arising, and does note some daytime sleepiness.  He has also had breathing issues for which he has seen Dr. Sherene Sires.  He has been started on oxygen and is doing better.    Review of Systems  Constitutional: Negative for fever and unexpected weight change.  HENT: Negative for ear pain, nosebleeds, congestion, sore throat, rhinorrhea, sneezing, trouble swallowing, dental problem, postnasal drip and sinus pressure.   Eyes: Negative for redness and itching.  Respiratory: Positive for shortness of breath and wheezing. Negative for cough and chest tightness.   Cardiovascular: Positive for leg swelling. Negative for palpitations.  Gastrointestinal: Negative for nausea and vomiting.  Genitourinary: Negative for dysuria.  Musculoskeletal: Negative for joint swelling.  Skin: Negative for rash.  Neurological: Positive for headaches.  Hematological: Does not bruise/bleed easily.  Psychiatric/Behavioral: Negative for dysphoric mood. The patient is not nervous/anxious.        Objective:   Physical Exam Morbidly obese male in no acute distress Nose without purulent discharge noted No skin breakdown or pressure necrosis from a CPAP mask Neck without lymphadenopathy or thyromegaly Chest basilar crackles, decreased breath sounds diffusely, no wheezing Cardiac exam regular rate and rhythm Lower extremities with 2+ edema bilaterally, no cyanosis Alert, does not appear to be sleepy, moves all 4 extremities.       Assessment & Plan:

## 2012-02-02 NOTE — Assessment & Plan Note (Signed)
The patient's history and blood work is suggestive of obesity hypoventilation.  Will need to check a blood gas for documentation, and then arrange for BiPAP ST.  Again, I have stressed to the patient the need to lose weight, and that if he continues on this path he is at risk for possible mechanical ventilation with tracheostomy.

## 2012-02-02 NOTE — Addendum Note (Signed)
Addended by: Michel Bickers A on: 02/02/2012 05:28 PM   Modules accepted: Orders

## 2012-02-02 NOTE — Assessment & Plan Note (Signed)
The patient has a history of sleep apnea, and has been compliant with CPAP.  However, he is having nonrestorative sleep and some daytime sleepiness.  I suspect he needs adjustments to his pressure, or perhaps a new machine since it is 73-67 years old.  I have encouraged him to work aggressively on weight loss.

## 2012-02-02 NOTE — Patient Instructions (Addendum)
Will schedule for a blood test at [facility] to check your carbon dioxide level in the blood.  Continue with cpap and oxygen.  Will send an order to [facility] for a new mask Work on weight loss I will call you once lab results is available, and arrange for followup with me.

## 2012-02-03 ENCOUNTER — Other Ambulatory Visit: Payer: Self-pay | Admitting: Endocrinology

## 2012-02-05 ENCOUNTER — Other Ambulatory Visit: Payer: Self-pay

## 2012-02-05 ENCOUNTER — Inpatient Hospital Stay (HOSPITAL_COMMUNITY): Admission: RE | Admit: 2012-02-05 | Payer: Medicare Other | Source: Ambulatory Visit

## 2012-02-05 ENCOUNTER — Telehealth: Payer: Self-pay | Admitting: Pulmonary Disease

## 2012-02-05 DIAGNOSIS — R0689 Other abnormalities of breathing: Secondary | ICD-10-CM

## 2012-02-05 MED ORDER — TOPIRAMATE 50 MG PO TABS: 50.0000 mg | ORAL_TABLET | Freq: Two times a day (BID) | ORAL | Status: DC

## 2012-02-05 NOTE — Telephone Encounter (Signed)
Did this pt get his blood gas on Friday.  I do not see it in computer.  thanks

## 2012-02-06 NOTE — Telephone Encounter (Signed)
I called the pt and he states he did go to [facility] after his appt here and had ABG drawn. Results are not in system. I called Pulmonary Lab and spoke with Huntley Dec and she is going to check on this and call me back.Carron Curie, CMA

## 2012-02-06 NOTE — Telephone Encounter (Signed)
I received ABG results. Placed them in [facility] look-at folder.Carron Curie, CMA

## 2012-02-07 ENCOUNTER — Encounter: Payer: Self-pay | Admitting: Pulmonary Disease

## 2012-02-07 ENCOUNTER — Other Ambulatory Visit: Payer: Self-pay | Admitting: Pulmonary Disease

## 2012-02-07 DIAGNOSIS — J9610 Chronic respiratory failure, unspecified whether with hypoxia or hypercapnia: Secondary | ICD-10-CM

## 2012-02-07 NOTE — Telephone Encounter (Signed)
LMOAM for pt to return my call in reference to order placed in [facility] box for patient. Rhonda J Cobb

## 2012-02-08 LAB — BLOOD GAS, ARTERIAL
Bicarbonate: 30.3 mEq/L — ABNORMAL HIGH (ref 20.0–24.0)
Drawn by: 283371
O2 Content: 2 L/min
Patient temperature: 98.6
pH, Arterial: 7.352 (ref 7.350–7.450)
pO2, Arterial: 70.1 mmHg — ABNORMAL LOW (ref 80.0–100.0)

## 2012-02-16 NOTE — Telephone Encounter (Signed)
Lecretia form [facility] says in addition to ABG's (which Bjorn Loser says we already have) they need spirometry and ONO (on room air) in order to qualify for bipap. Lecretia # [id number]. Hazel Sams

## 2012-02-19 NOTE — Telephone Encounter (Signed)
I do not think that is true!  Obesity hypoventilation has nothing to do with lung disease, and if fact most have normal lung function.  Happy to order ONO, but I want T.Davis involved in this to verify the validity.

## 2012-02-19 NOTE — Telephone Encounter (Signed)
KC , Tammy D has investigated this to be true we need and ono on ra order for this thanks so much for your help in this matter Tobe Sos

## 2012-02-19 NOTE — Telephone Encounter (Signed)
I know we need ONO, but I don't think it is true about pfts.  Let me know.

## 2012-02-19 NOTE — Telephone Encounter (Signed)
Will need order from Dr. Shelle Iron for ONO on room air and an order for spirometry. I have looked in [employer] and [employer] and can't locate a spirometry that has been done. Please advise.

## 2012-02-23 ENCOUNTER — Other Ambulatory Visit: Payer: Self-pay | Admitting: Physical Medicine and Rehabilitation

## 2012-02-23 ENCOUNTER — Encounter: Payer: Self-pay | Admitting: Pulmonary Disease

## 2012-02-23 ENCOUNTER — Encounter
Payer: Medicare Other | Attending: Physical Medicine and Rehabilitation | Admitting: Physical Medicine and Rehabilitation

## 2012-02-23 ENCOUNTER — Encounter: Payer: Self-pay | Admitting: Physical Medicine and Rehabilitation

## 2012-02-23 VITALS — BP 133/49 | HR 87 | Resp 16 | Ht 66.0 in | Wt 347.6 lb

## 2012-02-23 DIAGNOSIS — M25569 Pain in unspecified knee: Secondary | ICD-10-CM | POA: Insufficient documentation

## 2012-02-23 DIAGNOSIS — M545 Low back pain, unspecified: Secondary | ICD-10-CM | POA: Insufficient documentation

## 2012-02-23 DIAGNOSIS — R0602 Shortness of breath: Secondary | ICD-10-CM | POA: Insufficient documentation

## 2012-02-23 DIAGNOSIS — M1710 Unilateral primary osteoarthritis, unspecified knee: Secondary | ICD-10-CM | POA: Insufficient documentation

## 2012-02-23 DIAGNOSIS — G8929 Other chronic pain: Secondary | ICD-10-CM | POA: Insufficient documentation

## 2012-02-23 DIAGNOSIS — I890 Lymphedema, not elsewhere classified: Secondary | ICD-10-CM | POA: Insufficient documentation

## 2012-02-23 DIAGNOSIS — I872 Venous insufficiency (chronic) (peripheral): Secondary | ICD-10-CM | POA: Insufficient documentation

## 2012-02-23 DIAGNOSIS — Z9981 Dependence on supplemental oxygen: Secondary | ICD-10-CM | POA: Insufficient documentation

## 2012-02-23 DIAGNOSIS — M47817 Spondylosis without myelopathy or radiculopathy, lumbosacral region: Secondary | ICD-10-CM | POA: Insufficient documentation

## 2012-02-23 DIAGNOSIS — M1711 Unilateral primary osteoarthritis, right knee: Secondary | ICD-10-CM

## 2012-02-23 MED ORDER — MORPHINE SULFATE ER 15 MG PO TBCR: 15.0000 mg | EXTENDED_RELEASE_TABLET | Freq: Two times a day (BID) | ORAL | Status: DC

## 2012-02-23 MED ORDER — OXYCODONE-ACETAMINOPHEN 7.5-500 MG PO TABS: 1.0000 | ORAL_TABLET | Freq: Three times a day (TID) | ORAL | Status: DC | PRN

## 2012-02-23 NOTE — Telephone Encounter (Signed)
Pt still does not have his BiPap that was ordered on 02/02/12. I have checked with 2 DME companies. Both are telling us the same thing that is required for Bipap for Hypoventilation Syndrome. 1) ABG (which has been done) 2 Spirometry and 3) ONO.  Will you send an order for

## 2012-02-23 NOTE — Addendum Note (Signed)
Addended by: Caryl Ada on: 02/23/2012 02:35 PM   Modules accepted: Orders

## 2012-02-23 NOTE — Patient Instructions (Signed)
Continue with eating healthy, try to exercise your knee in a sitting position.

## 2012-02-23 NOTE — Telephone Encounter (Signed)
Will you send an order for pt to have a spirometry and an ono on room air? Please advise. Rhonda J Cobb

## 2012-02-23 NOTE — Telephone Encounter (Signed)
Orders were sent to [facility]

## 2012-02-23 NOTE — Progress Notes (Signed)
Subjective:    Patient ID: Blake Summers, male    DOB: 1946-05-12, 66 y.o.   MRN: [id number]  HPI The patient complains about chronic right knee pain. The patient denies any radiation. The patient also complains about an open wound on his left lower leg, which is treated by the wound care center. He states the wound is healing well.  The knee problem has been stable, his open wound is healing. The patient had to interrupt his physical therapy in the water because of the open wound. The patient states, that he had difficulties breathing, he saw his pulmonologist, and is now on O2. He reports that a HH-nurse was visiting him and educated him about his diabetis, medication and diet. He is now eating more healthy. He also reports, that the calf pumps have really improved the edema on both of his legs.  Pain Inventory Average Pain 8 Pain Right Now 7 My pain is sharp and stabbing  In the last 24 hours, has pain interfered with the following? General activity 9 Relation with others 9 Enjoyment of life 8 What TIME of day is your pain at its worst? daytime Sleep (in general) Poor  Pain is worse with: walking and standing Pain improves with: rest, medication and injections Relief from Meds: 6  Mobility use a walker ability to climb steps?  yes do you drive?  yes use a wheelchair  Function retired I need assistance with the following:  dressing, meal prep, household duties and shopping  Neuro/Psych numbness tingling trouble walking dizziness  Prior Studies Any changes since last visit?  no  Physicians involved in your care Any changes since last visit?  no   Family History  Problem Relation Age of Onset  . Cancer Brother     Colon Cancer  . Heart disease Brother   . Heart disease Brother   . Asthma Mother    History   Social History  . Marital Status: Married    Spouse Name: N/A    Number of Children: N/A  . Years of Education: N/A   Occupational History  .  Retired     Nature conservation officer   Social History Main Topics  . Smoking status: Former Smoker -- 0.5 packs/day for 15 years    Types: Cigarettes    Quit date: 06/12/1988  . Smokeless tobacco: Never Used  . Alcohol Use: No  . Drug Use: No  . Sexually Active: None   Other Topics Concern  . None   Social History Narrative   Diet is "good"Exercise is limited by medical problems   Past Surgical History  Procedure Date  . Lithotripsy 2007   Past Medical History  Diagnosis Date  . GERD 07/06/2010  . Edema 10/28/2007  . DIABETES MELLITUS, TYPE II 05/04/2007  . HYPERLIPIDEMIA 07/08/2008  . ALLERGIC RHINITIS 07/08/2008  . GLAUCOMA 07/08/2008  . CEREBROVASCULAR ACCIDENT, HX OF 07/08/2008  . OBSTRUCTIVE SLEEP APNEA 03/06/2008  . DIABETIC ULCER, LEFT LEG 08/23/2009  . 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 HYPERTENSION 10/28/2007  . VENOUS INSUFFICIENCY 03/08/2009  . FATTY LIVER DISEASE 05/19/2008  . BENIGN PROSTATIC HYPERTROPHY 07/08/2008  . 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 NEPHROLITHIASIS, HX OF 07/08/2008  . Morbid obesity   . DM nephropathy/sclerosis   . 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 Lumbar spondylosis    BP 133/49  Pulse 87  Resp 16  Ht 5\' 6"  (1.676 m)  Wt 347 lb 9.6 oz (157.67 kg)  BMI 56.10 kg/m2  SpO2 92%    Review of Systems  Musculoskeletal: Positive for back pain.  Neurological: Positive for dizziness and numbness.       Tingling  All other systems reviewed and are negative.       Objective:   Physical Exam The patient is a morbidly obese gentleman  He is oriented x3. Speech is clear. Affect is bright. He is  alert, cooperative, and pleasant. Follows commands without difficulty.  Answers my questions appropriately.  NEUROLOGIC: Cranial nerves, coordination are intact. His reflexes are  diminished in the lower extremities. He has good strength in  both lower extremities at hip flexors, knee extensors, dorsiflexors and  plantar  flexors.  Sensation is intact in the lower extremities. He is  noted to have chronic swelling in lower extremities.  Right Knee  He has painful joint line over his right knee.(medial and lateral)Motion is preserved.  bilateral hip flexion contracture 5-10 degrees  Symmetric normal motor tone is noted throughout. Normal muscle bulk. Muscle testing reveals 5/5 muscle strength of the upper extremity, and 5/5 of the lower extremity.  DTR in the upper and lower extremity are present and symmetric 2+. No clonus is noted.  Patient is in a wheel chair.         Assessment & Plan:  1. End-stage right knee osteoarthritis. Prescribed Voltaren gel, currently using lidoderm as well as knee sleeve, walker  2. Morbid obesity.  3. Generally deconditioned.  4. Chronic low back pain/spondylosis.  5. Chronic venous insufficiency/lymphedema.  6. SOB, O2 dependent  Continue following up with the wound care center for his open wound on the left lower leg. Try to go back to aquatic physical therapy when the wound has healed.Advised patient to do exercises at home, which were shown to him by home health. Continue with his calf pumps. Filled his medication today.  Follow up in 1 month

## 2012-02-26 ENCOUNTER — Other Ambulatory Visit: Payer: Self-pay | Admitting: Pulmonary Disease

## 2012-02-26 DIAGNOSIS — E662 Morbid (severe) obesity with alveolar hypoventilation: Secondary | ICD-10-CM

## 2012-02-28 ENCOUNTER — Telehealth: Payer: Self-pay | Admitting: Pulmonary Disease

## 2012-02-28 ENCOUNTER — Other Ambulatory Visit: Payer: Self-pay | Admitting: Pulmonary Disease

## 2012-02-28 DIAGNOSIS — E662 Morbid (severe) obesity with alveolar hypoventilation: Secondary | ICD-10-CM

## 2012-02-29 ENCOUNTER — Ambulatory Visit (HOSPITAL_BASED_OUTPATIENT_CLINIC_OR_DEPARTMENT_OTHER): Payer: Medicare Other | Attending: Pulmonary Disease | Admitting: Radiology

## 2012-02-29 VITALS — Ht 66.0 in | Wt 360.0 lb

## 2012-02-29 DIAGNOSIS — G4733 Obstructive sleep apnea (adult) (pediatric): Secondary | ICD-10-CM | POA: Insufficient documentation

## 2012-02-29 DIAGNOSIS — Z9989 Dependence on other enabling machines and devices: Secondary | ICD-10-CM

## 2012-02-29 DIAGNOSIS — E662 Morbid (severe) obesity with alveolar hypoventilation: Secondary | ICD-10-CM

## 2012-03-01 ENCOUNTER — Ambulatory Visit (INDEPENDENT_AMBULATORY_CARE_PROVIDER_SITE_OTHER): Payer: Medicare Other | Admitting: Endocrinology

## 2012-03-01 ENCOUNTER — Other Ambulatory Visit (INDEPENDENT_AMBULATORY_CARE_PROVIDER_SITE_OTHER): Payer: Medicare Other

## 2012-03-01 ENCOUNTER — Ambulatory Visit: Payer: Medicare Other | Admitting: Endocrinology

## 2012-03-01 ENCOUNTER — Encounter (INDEPENDENT_AMBULATORY_CARE_PROVIDER_SITE_OTHER): Payer: Medicare Other

## 2012-03-01 ENCOUNTER — Encounter: Payer: Self-pay | Admitting: Endocrinology

## 2012-03-01 VITALS — BP 122/70 | HR 92 | Temp 97.1°F | Resp 16 | Wt 349.1 lb

## 2012-03-01 DIAGNOSIS — Z23 Encounter for immunization: Secondary | ICD-10-CM

## 2012-03-01 DIAGNOSIS — E291 Testicular hypofunction: Secondary | ICD-10-CM

## 2012-03-01 DIAGNOSIS — E1142 Type 2 diabetes mellitus with diabetic polyneuropathy: Secondary | ICD-10-CM

## 2012-03-01 DIAGNOSIS — E119 Type 2 diabetes mellitus without complications: Secondary | ICD-10-CM

## 2012-03-01 DIAGNOSIS — R0602 Shortness of breath: Secondary | ICD-10-CM

## 2012-03-01 DIAGNOSIS — E876 Hypokalemia: Secondary | ICD-10-CM

## 2012-03-01 DIAGNOSIS — E1049 Type 1 diabetes mellitus with other diabetic neurological complication: Secondary | ICD-10-CM

## 2012-03-01 DIAGNOSIS — E1149 Type 2 diabetes mellitus with other diabetic neurological complication: Secondary | ICD-10-CM

## 2012-03-01 DIAGNOSIS — E1065 Type 1 diabetes mellitus with hyperglycemia: Secondary | ICD-10-CM

## 2012-03-01 LAB — BASIC METABOLIC PANEL
Calcium: 8.8 mg/dL (ref 8.4–10.5)
Creatinine, Ser: 1 mg/dL (ref 0.4–1.5)
GFR: 84.02 mL/min (ref >60.00)
Glucose, Bld: 286 mg/dL — ABNORMAL HIGH (ref 70–99)
Sodium: 135 mEq/L (ref 135–145)

## 2012-03-01 LAB — PULMONARY FUNCTION TEST

## 2012-03-01 MED ORDER — POTASSIUM CHLORIDE CRYS ER 20 MEQ PO TBCR: 20.0000 meq | EXTENDED_RELEASE_TABLET | Freq: Two times a day (BID) | ORAL | Status: DC

## 2012-03-01 MED ORDER — TESTOSTERONE 50 MG/5GM (1%) TD GEL: 10.0000 g | Freq: Every day | TRANSDERMAL | Status: DC

## 2012-03-01 NOTE — Progress Notes (Signed)
Subjective:    Patient ID: Blake Summers, male    DOB: Oct 30, 1945, 66 y.o.   MRN: [id number]  HPI The state of at least three ongoing medical problems is addressed today: Pt returns for f/u of insulin-requiring DM (dx'ed 1990; complicated by peripheral sensory neuropathy, mild CAD, CVA, and foot ulcer).   pt states he feels well in general.  no cbg record, but states cbg's are sometimes low in the early hrs of the am, or in the afternoon.   Hypokalemia: denies cramps Hypogonadism: he still has fatigue Past Medical History  Diagnosis Date  . GERD 07/06/2010  . Edema 10/28/2007  . DIABETES MELLITUS, TYPE II 05/04/2007  . HYPERLIPIDEMIA 07/08/2008  . ALLERGIC RHINITIS 07/08/2008  . GLAUCOMA 07/08/2008  . CEREBROVASCULAR ACCIDENT, HX OF 07/08/2008  . OBSTRUCTIVE SLEEP APNEA 03/06/2008  . DIABETIC ULCER, LEFT LEG 08/23/2009  . 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 HYPERTENSION 10/28/2007  . VENOUS INSUFFICIENCY 03/08/2009  . FATTY LIVER DISEASE 05/19/2008  . BENIGN PROSTATIC HYPERTROPHY 07/08/2008  . 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 NEPHROLITHIASIS, HX OF 07/08/2008  . Morbid obesity   . DM nephropathy/sclerosis   . 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 Lumbar spondylosis     Past Surgical History  Procedure Date  . Lithotripsy 2007    History   Social History  . Marital Status: Married    Spouse Name: N/A    Number of Children: N/A  . Years of Education: N/A   Occupational History  . Retired     Nature conservation officer   Social History Main Topics  . Smoking status: Former Smoker -- 0.5 packs/day for 15 years    Types: Cigarettes    Quit date: 06/12/1988  . Smokeless tobacco: Never Used  . Alcohol Use: No  . Drug Use: No  . Sexually Active: Not on file   Other Topics Concern  . Not on file   Social History Narrative   Diet is "good"Exercise is limited by medical problems    Current Outpatient Prescriptions on File Prior to Visit  Medication Sig  Dispense Refill  . AGGRENOX 25-200 MG per 12 hr capsule TAKE 1 CAPSULE BY MOUTH 2 TIMES         DAILY  60 each  5  . allopurinol (ZYLOPRIM) 300 MG tablet TAKE 1 TABLET BY MOUTH EVERY DAY  30 tablet  5  . brimonidine (ALPHAGAN) 0.15 % ophthalmic solution as directed.      Marland Kitchen CLARITIN-D 12 HOUR 5-120 MG per tablet TAKE ONE (1) TABLET BY MOUTH TWO (2)    TIMES DAILY AS NEEDED  60 tablet  5  . clobetasol ointment (TEMOVATE) 0.05 % APPLY TO AFFECTED AREA 3 TIMES DAILY    AS NEEDED ITCHING  60 g  3  . dexlansoprazole (DEXILANT) 60 MG capsule Take 1 capsule (60 mg total) by mouth daily.  30 capsule  2  . diclofenac sodium (VOLTAREN) 1 % GEL Apply 1 application topically 4 (four) times daily.  2 Tube  2  . diltiazem (CARDIZEM CD) 180 MG 24 hr capsule TAKE 2 CAPSULES BY MOUTH DAILY  60 capsule  5  . dorzolamide-timolol (COSOPT) 22.3-6.8 MG/ML ophthalmic solution as directed.      . furosemide (LASIX) 80 MG tablet TAKE 1 TABLET BY MOUTH EVERY DAY  30 tablet  5  . glucose blood (ONE TOUCH TEST STRIPS) test strip Check blood sugar qid       .  insulin lispro (HUMALOG KWIKPEN) 100 UNIT/ML injection Inject subcutaneously 3x a day (just before each meal) 90-120-160 units  105 mL  5  . insulin NPH (HUMULIN N,NOVOLIN N) 100 UNIT/ML injection Inject 90 Units into the skin at bedtime.       Marland Kitchen ketoconazole (NIZORAL) 2 % shampoo as directed.      . latanoprost (XALATAN) 0.005 % ophthalmic solution Place 1 drop into both eyes at bedtime.  2.5 mL  1  . lidocaine (LIDODERM) 5 % Place 1-3 patches onto the skin daily. Remove & Discard patch within 12 hours or as directed by MD      . losartan-hydrochlorothiazide (HYZAAR) 100-25 MG per tablet TAKE 1 TABLET BY MOUTH EVERY DAY FOR    BLOOD PRESSURE  30 tablet  5  . lovastatin (MEVACOR) 40 MG tablet TAKE 2 TABLETS BY MOUTH EVERY NIGHT     AT BEDTIME  60 tablet  5  . metFORMIN (GLUCOPHAGE-XR) 500 MG 24 hr tablet TAKE 2 TABLETS BY MOUTH                 2 TIMES DAILY  120 tablet  5   . methocarbamol (ROBAXIN) 500 MG tablet TAKE 1 TABLET BY MOUTH EVERY NIGHT AT   BEDTIME AS NEEDED FOR CRAMPS  30 tablet  5  . morphine (MS CONTIN) 15 MG 12 hr tablet Take 1 tablet (15 mg total) by mouth 2 (two) times daily.  60 tablet  0  . naproxen sodium (ANAPROX) 220 MG tablet 2 tablets by mouth once daily as needed for pain       . NOVOFINE 30G X 8 MM MISC USE 4 TIMES A DAY.  100 each  6  . oxyCODONE-acetaminophen (PERCOCET) 7.5-500 MG per tablet Take 1 tablet by mouth every 8 (eight) hours as needed for pain. 1 tablet by mouth three times a day as needed  90 tablet  0  . polyethylene glycol powder (GLYCOLAX/MIRALAX) powder DISSOLVE 1 CAPFUL (17GM) IN 8 TO 10     OUNCES OF WATER DAILY  527 g  5  . PROVENTIL HFA 108 (90 BASE) MCG/ACT inhaler ONE PUFF EVERY 4 HOURS AS NEEDED  6.7 g  5  . ranitidine (ZANTAC) 150 MG tablet 2  At bedtime  60 tablet  5  . STOOL SOFTENER 100 MG capsule TAKE 1 CAPSULE BY MOUTH 2 TIMES DAILY ASNEEDED  60 each  5  . testosterone (ANDROGEL) 50 MG/5GM GEL Place 10 g onto the skin daily.  60 Package  5  . topiramate (TOPAMAX) 50 MG tablet Take 1 tablet (50 mg total) by mouth 2 (two) times daily.  60 tablet  1  . zolpidem (AMBIEN) 10 MG tablet Take 10 mg by mouth at bedtime as needed.          Allergies  Allergen Reactions  . Neosporin (Neomycin-Polymyxin-Gramicidin)     Family History  Problem Relation Age of Onset  . Cancer Brother     Colon Cancer  . Heart disease Brother   . Heart disease Brother   . Asthma Mother     BP 122/70  Pulse 92  Temp 97.1 F (36.2 C) (Oral)  Resp 16  Wt 349 lb 2 oz (158.362 kg)  SpO2 93%  Review of Systems Denies LOC/weight change//decreased urinary stream    Objective:   Physical Exam VITAL SIGNS:  See vs page GENERAL: no distress. Morbid obesity.  In wheelchair Ext: sees [facility] wound care  Lab Results  Component Value Date   WBC 13.4* 01/02/2012   HGB 13.8 01/02/2012   HCT 42.0 01/02/2012   PLT 311.0 01/02/2012    GLUCOSE 286* 03/01/2012   CHOL 107 07/28/2011   TRIG 174.0* 07/28/2011   HDL 28.80* 07/28/2011   LDLCALC 43 07/28/2011   ALT 29 01/02/2012   AST 26 01/02/2012   NA 135 03/01/2012   K 3.7 03/01/2012   CL 96 03/01/2012   CREATININE 1.0 03/01/2012   BUN 13 03/01/2012   CO2 30 03/01/2012   TSH 1.02 01/02/2012   PSA 0.50 07/28/2011   INR 1.0 04/03/2011   HGBA1C 6.8* 03/01/2012   MICROALBUR 0.5 07/28/2011   Lab Results  Component Value Date   TESTOSTERONE 146.78* 03/01/2012       Assessment & Plan:  DM, well-controlled Hypogonadism, well-controlled Hypokalemia, well-controlled

## 2012-03-01 NOTE — Patient Instructions (Addendum)
blood tests are being requested for you today.  You will receive a letter with results. Reduce the NPH to 90 units at bedtime.  Please come back for a follow-up appointment in 4 months.   check your blood sugar twice a day.  vary the time of day when you check, between before the 3 meals, and at bedtime.  also check if you have symptoms of your blood sugar being too high or too low.  please keep a record of the readings and bring it to your next appointment here.  please call us sooner if your blood sugar goes below 70, or if you have a lot of readings over 200.  i'll sign the form for diabetic shoes.

## 2012-03-05 ENCOUNTER — Telehealth: Payer: Self-pay | Admitting: *Deleted

## 2012-03-05 ENCOUNTER — Other Ambulatory Visit: Payer: Self-pay | Admitting: Physical Medicine and Rehabilitation

## 2012-03-05 ENCOUNTER — Encounter: Payer: Self-pay | Admitting: Gastroenterology

## 2012-03-05 MED ORDER — TESTOSTERONE 50 MG/5GM (1%) TD GEL: 10.0000 g | Freq: Every day | TRANSDERMAL | Status: DC

## 2012-03-05 NOTE — Telephone Encounter (Signed)
i printed 

## 2012-03-05 NOTE — Telephone Encounter (Signed)
Per Pharmacy, insurance does not cover Androgel, can pt switch to Testim.

## 2012-03-06 ENCOUNTER — Other Ambulatory Visit: Payer: Self-pay | Admitting: *Deleted

## 2012-03-06 MED ORDER — LATANOPROST 0.005 % OP SOLN: 1.0000 [drp] | Freq: Every day | OPHTHALMIC | Status: DC

## 2012-03-06 MED ORDER — DEXLANSOPRAZOLE 60 MG PO CPDR: 60.0000 mg | DELAYED_RELEASE_CAPSULE | Freq: Every day | ORAL | Status: DC

## 2012-03-06 NOTE — Telephone Encounter (Signed)
Rx faxed to [facility].

## 2012-03-08 DIAGNOSIS — G4710 Hypersomnia, unspecified: Secondary | ICD-10-CM

## 2012-03-08 DIAGNOSIS — G4730 Sleep apnea, unspecified: Secondary | ICD-10-CM

## 2012-03-08 NOTE — Procedures (Signed)
NAMEEDVARDO, HONSE               ACCOUNT NO.:  [id number]  MEDICAL RECORD NO.:  [id number]          PATIENT TYPE:  OUT  LOCATION:  SLEEP CENTER                 FACILITY:  [facility]  PHYSICIAN:  Barbaraann Share, MD,FCCPDATE OF BIRTH:  September 29, 1945  DATE OF STUDY:  02/29/2012                           NOCTURNAL POLYSOMNOGRAM  REFERRING PHYSICIAN:  Barbaraann Share, MD,FCCP  LOCATION:  Sleep Lab.  INDICATION FOR STUDY:  Hypersomnia with sleep apnea.  EPWORTH SLEEPINESS SCORE:  12.  MEDICATIONS:  SLEEP ARCHITECTURE:  The patient had a total sleep time of 267 minutes with very little slow-wave sleep and never achieved REM.  Sleep onset latency was normal at 23 minutes and sleep efficiency was poor at 73%.  RESPIRATORY DATA:  The patient was found to have 23 apneas and 27 obstructive hypopneas, giving him an apnea-hypopnea index of 11 events per hour.  The events occurred primarily in the supine position, and there was moderate snoring noted throughout.  The patient did not meet split night protocol secondary to the majority of his events occurring well after 2 a.m.  OXYGEN DATA:  The patient's study was done on 2 liters/minute of oxygen, and he was noted to have desaturation as low as 89% with his obstructive events.  CARDIAC DATA:  Rare PVC noted, but no clinically significant arrhythmias were seen.  MOVEMENT-PARASOMNIA:  The patient had no significant leg jerks or other abnormal behaviors noted.  IMPRESSIONS-RECOMMENDATIONS: 1. Mild obstructive sleep apnea/hypopnea syndrome, with an AHI of 11     events per hour and O2 desaturation as low as 89%.  It should be     noted the patient had very fragmented sleep and never achieved REM,     and therefore, his degree of sleep apnea is likely to be     underestimated.  Treatment for this degree of sleep apnea can     include     weight loss, upper airway surgery, dental appliance, and also a     continuous positive airway  pressure. 2. Rare premature ventricular contraction noted, but no clinically     significant arrhythmias were seen.     Barbaraann Share, MD,FCCP Diplomate, American Board of Sleep Medicine    [facility]/MEDQ  D:  03/08/2012 08:49:09  T:  03/08/2012 23:13:34  Job:  [id number]

## 2012-03-11 ENCOUNTER — Other Ambulatory Visit: Payer: Self-pay | Admitting: Pulmonary Disease

## 2012-03-11 ENCOUNTER — Telehealth: Payer: Self-pay | Admitting: Pulmonary Disease

## 2012-03-11 DIAGNOSIS — E662 Morbid (severe) obesity with alveolar hypoventilation: Secondary | ICD-10-CM

## 2012-03-11 NOTE — Telephone Encounter (Signed)
Please let pt know that his breathing studies show restriction from his obesity, and his sleep study verified sleep apnea, but he also has obesity hypoventilation. Will proceed with his equipment as ordered.

## 2012-03-12 NOTE — Telephone Encounter (Signed)
Pt returned my call this morning and below explained to patient. Advised patient that we have sent BiPap order to [facility] and he should be hearing from them shortly. If he has any concerns or questions, to feel free to contact me at [phone]. Rhonda J Cobb

## 2012-03-22 ENCOUNTER — Encounter: Payer: Self-pay | Admitting: Pulmonary Disease

## 2012-03-27 ENCOUNTER — Encounter
Payer: Medicare Other | Attending: Physical Medicine and Rehabilitation | Admitting: Physical Medicine and Rehabilitation

## 2012-03-27 ENCOUNTER — Encounter: Payer: Self-pay | Admitting: Physical Medicine and Rehabilitation

## 2012-03-27 VITALS — BP 161/73 | HR 89 | Resp 16 | Ht 66.0 in | Wt 350.0 lb

## 2012-03-27 DIAGNOSIS — M25569 Pain in unspecified knee: Secondary | ICD-10-CM | POA: Insufficient documentation

## 2012-03-27 DIAGNOSIS — R5381 Other malaise: Secondary | ICD-10-CM | POA: Insufficient documentation

## 2012-03-27 DIAGNOSIS — I890 Lymphedema, not elsewhere classified: Secondary | ICD-10-CM | POA: Insufficient documentation

## 2012-03-27 DIAGNOSIS — I872 Venous insufficiency (chronic) (peripheral): Secondary | ICD-10-CM | POA: Insufficient documentation

## 2012-03-27 DIAGNOSIS — Z9981 Dependence on supplemental oxygen: Secondary | ICD-10-CM | POA: Insufficient documentation

## 2012-03-27 DIAGNOSIS — R0602 Shortness of breath: Secondary | ICD-10-CM | POA: Insufficient documentation

## 2012-03-27 DIAGNOSIS — M47812 Spondylosis without myelopathy or radiculopathy, cervical region: Secondary | ICD-10-CM | POA: Insufficient documentation

## 2012-03-27 DIAGNOSIS — M1711 Unilateral primary osteoarthritis, right knee: Secondary | ICD-10-CM

## 2012-03-27 DIAGNOSIS — M1710 Unilateral primary osteoarthritis, unspecified knee: Secondary | ICD-10-CM | POA: Insufficient documentation

## 2012-03-27 MED ORDER — MORPHINE SULFATE ER 15 MG PO TBCR: 15.0000 mg | EXTENDED_RELEASE_TABLET | Freq: Two times a day (BID) | ORAL | Status: DC

## 2012-03-27 MED ORDER — OXYCODONE-ACETAMINOPHEN 7.5-500 MG PO TABS: 1.0000 | ORAL_TABLET | Freq: Three times a day (TID) | ORAL | Status: DC | PRN

## 2012-03-27 NOTE — Patient Instructions (Signed)
Continue with your exercises, try to look into exercising in the water when your wound has healed.

## 2012-03-27 NOTE — Progress Notes (Signed)
Subjective:    Patient ID: Blake Summers, male    DOB: [birth year], 66 y.o.   MRN: [id number]  HPI The patient complains about chronic right knee pain. The patient denies any radiation. The patient also complains about an open wound on his left lower leg, which is treated by the wound care center. He states the wound is healing well.  The knee problem has been stable, his open wound is healing. The patient had to interrupt his physical therapy in the water because of the open wound. The patient states, that he had difficulties breathing, he saw his pulmonologist, and is now on O2. He reports that a HH-nurse was visiting him and educated him about his diabetis, medication and diet. He is now eating more healthy. He also reports, that the calf pumps have really improved the edema on both of his legs.  Pain Inventory Average Pain 8 Pain Right Now 7 My pain is sharp and stabbing  In the last 24 hours, has pain interfered with the following? General activity 9 Relation with others 9 Enjoyment of life 8 What TIME of day is your pain at its worst? daytime Sleep (in general) Poor  Pain is worse with: walking and standing Pain improves with: rest, medication and injections Relief from Meds: 6  Mobility walk with assistance use a cane use a walker ability to climb steps?  yes do you drive?  yes use a wheelchair needs help with transfers  Function retired I need assistance with the following:  dressing, meal prep, household duties and shopping  Neuro/Psych numbness tingling trouble walking dizziness  Prior Studies Any changes since last visit?  no  Physicians involved in your care Any changes since last visit?  no   Family History  Problem Relation Age of Onset  . Cancer Brother     Colon Cancer  . Heart disease Brother   . Heart disease Brother   . Asthma Mother    History   Social History  . Marital Status: Married    Spouse Name: N/A    Number of Children: N/A    . Years of Education: N/A   Occupational History  . Retired     Nature conservation officer   Social History Main Topics  . Smoking status: Former Smoker -- 0.5 packs/day for 15 years    Types: Cigarettes    Quit date: 06/12/1988  . Smokeless tobacco: Never Used  . Alcohol Use: No  . Drug Use: No  . Sexually Active: None   Other Topics Concern  . None   Social History Narrative   Diet is "good"Exercise is limited by medical problems   Past Surgical History  Procedure Date  . Lithotripsy 2007   Past Medical History  Diagnosis Date  . GERD 07/06/2010  . Edema 10/28/2007  . DIABETES MELLITUS, TYPE II 05/04/2007  . HYPERLIPIDEMIA 07/08/2008  . ALLERGIC RHINITIS 07/08/2008  . GLAUCOMA 07/08/2008  . CEREBROVASCULAR ACCIDENT, HX OF 07/08/2008  . OBSTRUCTIVE SLEEP APNEA 03/06/2008  . DIABETIC ULCER, LEFT LEG 08/23/2009  . 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 HYPERTENSION 10/28/2007  . VENOUS INSUFFICIENCY 03/08/2009  . FATTY LIVER DISEASE 05/19/2008  . BENIGN PROSTATIC HYPERTROPHY 07/08/2008  . 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 NEPHROLITHIASIS, HX OF 07/08/2008  . Morbid obesity   . DM nephropathy/sclerosis   . 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 Lumbar spondylosis    BP 161/73  Pulse 89  Resp 16  Ht 5'  6" (1.676 m)  Wt 350 lb (158.759 kg)  BMI 56.49 kg/m2  SpO2 90%      Review of Systems  Constitutional: Negative.   HENT: Negative.   Eyes: Negative.   Respiratory: Negative.   Cardiovascular: Negative.   Gastrointestinal: Negative.   Genitourinary: Negative.   Musculoskeletal: Positive for myalgias, back pain, arthralgias and gait problem.  Skin: Negative.   Neurological: Positive for numbness.  Hematological: Negative.   Psychiatric/Behavioral: Negative.        Objective:   Physical Exam The patient is a morbidly obese gentleman  He is oriented x3. Speech is clear. Affect is bright. He is  alert, cooperative, and pleasant. Follows  commands without difficulty.  Answers my questions appropriately.  NEUROLOGIC: Cranial nerves, coordination are intact. His reflexes are  diminished in the lower extremities. He has good strength in  both lower extremities at hip flexors, knee extensors, dorsiflexors and  plantar flexors.  Sensation is intact in the lower extremities. He is  noted to have chronic swelling in lower extremities.  Right Knee  He has painful joint line over his right knee.(medial and lateral)Motion is preserved.  bilateral hip flexion contracture 5-10 degrees  Symmetric normal motor tone is noted throughout. Normal muscle bulk. Muscle testing reveals 5/5 muscle strength of the upper extremity, and 5/5 of the lower extremity.  DTR in the upper and lower extremity are present and symmetric 2+. No clonus is noted.  Patient is in a wheel chair.         Assessment & Plan:  1. End-stage right knee osteoarthritis. Prescribed Voltaren gel, currently using lidoderm as well as knee sleeve, walker  2. Morbid obesity.  3. Generally deconditioned.  4. Chronic low back pain/spondylosis.  5. Chronic venous insufficiency/lymphedema.  6. SOB, O2 dependent  Continue following up with the wound care center for his open wound on the left lower leg. Try to go back to aquatic physical therapy when the wound has healed.Advised patient to do exercises at home, which I showed him today. Continue with his calf pumps.  Filled his medication today.  Follow up in 1 month

## 2012-04-22 ENCOUNTER — Telehealth: Payer: Self-pay | Admitting: Pulmonary Disease

## 2012-04-22 DIAGNOSIS — G4733 Obstructive sleep apnea (adult) (pediatric): Secondary | ICD-10-CM

## 2012-04-22 NOTE — Telephone Encounter (Signed)
Spoke with the pt. He states still has not heard from [facility] regarding status of starting on BIPAP. Rhonda, I see that you gave order to [facility] for this on 03/11/12.  Can you please check and see what the problem is, thanks!

## 2012-04-23 NOTE — Telephone Encounter (Signed)
Spoke with patient and he is requesting  1) an order for a ResMed Swift FX Nasal pillows Large to be sent to [facility]. 2) Pt is aware that we are trying to obtain approval for BiPap. Dr. Shelle Iron please advise if patient needs a return ov for Bipap for osa? Pt is willing to come in for an appointment. Rhonda J Cobb

## 2012-04-23 NOTE — Telephone Encounter (Signed)
Ok for his mask. Where are we on the bipap?  Do we need to send to another dme to get this done?

## 2012-04-24 ENCOUNTER — Encounter: Payer: Self-pay | Admitting: Physical Medicine and Rehabilitation

## 2012-04-24 ENCOUNTER — Encounter
Payer: Medicare Other | Attending: Physical Medicine and Rehabilitation | Admitting: Physical Medicine and Rehabilitation

## 2012-04-24 VITALS — BP 131/52 | HR 96 | Resp 16 | Ht 66.0 in | Wt 360.0 lb

## 2012-04-24 DIAGNOSIS — M1711 Unilateral primary osteoarthritis, right knee: Secondary | ICD-10-CM

## 2012-04-24 DIAGNOSIS — G8929 Other chronic pain: Secondary | ICD-10-CM | POA: Insufficient documentation

## 2012-04-24 DIAGNOSIS — M25561 Pain in right knee: Secondary | ICD-10-CM

## 2012-04-24 DIAGNOSIS — I890 Lymphedema, not elsewhere classified: Secondary | ICD-10-CM | POA: Insufficient documentation

## 2012-04-24 DIAGNOSIS — Z9981 Dependence on supplemental oxygen: Secondary | ICD-10-CM | POA: Insufficient documentation

## 2012-04-24 DIAGNOSIS — M25569 Pain in unspecified knee: Secondary | ICD-10-CM

## 2012-04-24 DIAGNOSIS — M545 Low back pain, unspecified: Secondary | ICD-10-CM | POA: Insufficient documentation

## 2012-04-24 DIAGNOSIS — M47817 Spondylosis without myelopathy or radiculopathy, lumbosacral region: Secondary | ICD-10-CM | POA: Insufficient documentation

## 2012-04-24 DIAGNOSIS — I872 Venous insufficiency (chronic) (peripheral): Secondary | ICD-10-CM | POA: Insufficient documentation

## 2012-04-24 DIAGNOSIS — R0602 Shortness of breath: Secondary | ICD-10-CM | POA: Insufficient documentation

## 2012-04-24 DIAGNOSIS — M1710 Unilateral primary osteoarthritis, unspecified knee: Secondary | ICD-10-CM | POA: Insufficient documentation

## 2012-04-24 DIAGNOSIS — R5381 Other malaise: Secondary | ICD-10-CM | POA: Insufficient documentation

## 2012-04-24 MED ORDER — MORPHINE SULFATE ER 15 MG PO TBCR: 15.0000 mg | EXTENDED_RELEASE_TABLET | Freq: Two times a day (BID) | ORAL | Status: DC

## 2012-04-24 MED ORDER — OXYCODONE-ACETAMINOPHEN 7.5-500 MG PO TABS: 1.0000 | ORAL_TABLET | Freq: Three times a day (TID) | ORAL | Status: DC | PRN

## 2012-04-24 NOTE — Patient Instructions (Signed)
Continue with eating healthy and trying to loose weight, continue with exercising in a sitting or recumbent position.

## 2012-04-24 NOTE — Telephone Encounter (Signed)
Order sent to [facility] for resmed swift fx nasal pillows.  Rhonda, please advise on status of Bipap.

## 2012-04-24 NOTE — Telephone Encounter (Signed)
Pt has changed DME providers and is now [facility]. Order for new mask has been faxed to [facility] and pt is aware of this. Pt is also aware that we are working on obtaining a Auto BiPAP for him through [facility].  Rhonda J Cobb

## 2012-04-24 NOTE — Progress Notes (Signed)
Subjective:    Patient ID: Blake Summers, male    DOB: 1945-06-16, 66 y.o.   MRN: [id number]  HPI The patient complains about chronic right knee pain. The patient denies any radiation. The patient also complains about an open wound on his left lower leg, which is treated by the wound care center. He states the wound is healing well.  The knee problem has been stable, his open wound is healing. The patient had to interrupt his physical therapy in the water because of the open wound. The patient states, that he had difficulties breathing, he saw his pulmonologist, and is now on O2. He reports that a HH-nurse was visiting him and educated him about his diabetis, medication and diet. He is now eating more healthy. He also reports, that the calf pumps have really improved the edema on both of his legs.  Pain Inventory Average Pain 8 Pain Right Now 7 My pain is sharp and stabbing  In the last 24 hours, has pain interfered with the following? General activity 9 Relation with others 9 Enjoyment of life 8 What TIME of day is your pain at its worst? daytime Sleep (in general) Poor  Pain is worse with: walking and standing Pain improves with: rest, medication and injections Relief from Meds: 7  Mobility walk with assistance use a cane use a walker how many minutes can you walk? 2-3 ability to climb steps?  yes do you drive?  yes use a wheelchair needs help with transfers  Function retired I need assistance with the following:  dressing, meal prep, household duties and shopping  Neuro/Psych numbness tingling trouble walking dizziness  Prior Studies Any changes since last visit?  no  Physicians involved in your care Any changes since last visit?  no   Family History  Problem Relation Age of Onset  . Cancer Brother     Colon Cancer  . Heart disease Brother   . Heart disease Brother   . Asthma Mother    History   Social History  . Marital Status: Married    Spouse  Name: N/A    Number of Children: N/A  . Years of Education: N/A   Occupational History  . Retired     Nature conservation officer   Social History Main Topics  . Smoking status: Former Smoker -- 0.5 packs/day for 15 years    Types: Cigarettes    Quit date: 06/12/1988  . Smokeless tobacco: Never Used  . Alcohol Use: No  . Drug Use: No  . Sexually Active: None   Other Topics Concern  . None   Social History Narrative   Diet is "good"Exercise is limited by medical problems   Past Surgical History  Procedure Date  . Lithotripsy 2007   Past Medical History  Diagnosis Date  . GERD 07/06/2010  . Edema 10/28/2007  . DIABETES MELLITUS, TYPE II 05/04/2007  . HYPERLIPIDEMIA 07/08/2008  . ALLERGIC RHINITIS 07/08/2008  . GLAUCOMA 07/08/2008  . CEREBROVASCULAR ACCIDENT, HX OF 07/08/2008  . OBSTRUCTIVE SLEEP APNEA 03/06/2008  . DIABETIC ULCER, LEFT LEG 08/23/2009  . 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 HYPERTENSION 10/28/2007  . VENOUS INSUFFICIENCY 03/08/2009  . FATTY LIVER DISEASE 05/19/2008  . BENIGN PROSTATIC HYPERTROPHY 07/08/2008  . 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 NEPHROLITHIASIS, HX OF 07/08/2008  . Morbid obesity   . DM nephropathy/sclerosis   . 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 Lumbar spondylosis    BP 131/52  Pulse 96  Resp 16  Ht 5\' 6"  (1.676 m)  Wt 360 lb (163.295 kg)  BMI 58.11 kg/m2  SpO2 92%     Review of Systems  Musculoskeletal: Positive for myalgias, back pain, arthralgias and gait problem.  Neurological: Positive for dizziness and numbness.  All other systems reviewed and are negative.       Objective:   Physical Exam  The patient is a morbidly obese gentleman  He is oriented x3. Speech is clear. Affect is bright. He is  alert, cooperative, and pleasant. Follows commands without difficulty.  Answers my questions appropriately.  NEUROLOGIC: Cranial nerves, coordination are intact. His reflexes are  diminished in the lower  extremities. He has good strength in  both lower extremities at hip flexors, knee extensors, dorsiflexors and  plantar flexors.  Sensation is intact in the lower extremities. He is  noted to have chronic swelling in lower extremities.  Right Knee  He has painful joint line over his right knee.(medial and lateral)Motion is preserved.  bilateral hip flexion contracture 5-10 degrees  Symmetric normal motor tone is noted throughout. Normal muscle bulk. Muscle testing reveals 5/5 muscle strength of the upper extremity, and 5/5 of the lower extremity.  DTR in the upper and lower extremity are present and symmetric 2+. No clonus is noted.  Patient is in a wheel chair.        Assessment & Plan:  1. End-stage right knee osteoarthritis. Prescribed Voltaren gel, currently using lidoderm as well as knee sleeve, walker  2. Morbid obesity.  3. Generally deconditioned.  4. Chronic low back pain/spondylosis.  5. Chronic venous insufficiency/lymphedema.  6. SOB, O2 dependent  Continue following up with the wound care center for his open wound on the left lower leg. Try to go back to aquatic physical therapy when the wound has healed.Advised patient to do exercises at home, in a sitting or recumbent position, which I showed him today. Continue with his calf pumps.  Filled his medication today.  Follow up in 1 month

## 2012-04-29 ENCOUNTER — Telehealth: Payer: Self-pay | Admitting: Pulmonary Disease

## 2012-04-29 NOTE — Telephone Encounter (Signed)
The pt has had a rising serum bicarbonate level consistent with worsening hypercarbia despite wearing cpap compliantly.  His recent ABG shows significant hypercarbia.  It is obvious his cpap therapy is not effective, and would benefit from a respiratory assist device/noninvasive ventilator for worsening chronic respiratory failure.   Will send in the order for an IVAPS device.

## 2012-05-02 ENCOUNTER — Other Ambulatory Visit: Payer: Self-pay | Admitting: Pulmonary Disease

## 2012-05-02 DIAGNOSIS — J9610 Chronic respiratory failure, unspecified whether with hypoxia or hypercapnia: Secondary | ICD-10-CM

## 2012-05-02 DIAGNOSIS — G4733 Obstructive sleep apnea (adult) (pediatric): Secondary | ICD-10-CM

## 2012-05-02 DIAGNOSIS — E662 Morbid (severe) obesity with alveolar hypoventilation: Secondary | ICD-10-CM

## 2012-05-06 ENCOUNTER — Other Ambulatory Visit: Payer: Self-pay

## 2012-05-06 MED ORDER — CLOBETASOL PROPIONATE 0.05 % EX OINT: TOPICAL_OINTMENT | Freq: Three times a day (TID) | CUTANEOUS | Status: DC

## 2012-05-06 MED ORDER — LORATADINE-PSEUDOEPHEDRINE ER 5-120 MG PO TB12: 1.0000 | ORAL_TABLET | Freq: Two times a day (BID) | ORAL | Status: DC

## 2012-05-06 MED ORDER — INSULIN NPH (HUMAN) (ISOPHANE) 100 UNIT/ML ~~LOC~~ SUSP: 90.0000 [IU] | Freq: Every day | SUBCUTANEOUS | Status: DC

## 2012-05-06 MED ORDER — FUROSEMIDE 80 MG PO TABS: 80.0000 mg | ORAL_TABLET | Freq: Every day | ORAL | Status: DC

## 2012-05-06 MED ORDER — ALLOPURINOL 300 MG PO TABS: 300.0000 mg | ORAL_TABLET | ORAL | Status: DC

## 2012-05-07 ENCOUNTER — Other Ambulatory Visit: Payer: Self-pay | Admitting: *Deleted

## 2012-05-07 MED ORDER — LATANOPROST 0.005 % OP SOLN: 1.0000 [drp] | Freq: Every day | OPHTHALMIC | Status: DC

## 2012-05-23 ENCOUNTER — Encounter: Payer: Self-pay | Admitting: Physical Medicine and Rehabilitation

## 2012-05-23 ENCOUNTER — Encounter
Payer: Medicare Other | Attending: Physical Medicine and Rehabilitation | Admitting: Physical Medicine and Rehabilitation

## 2012-05-23 VITALS — BP 133/60 | HR 83 | Resp 14 | Ht 66.0 in | Wt 350.0 lb

## 2012-05-23 DIAGNOSIS — M25561 Pain in right knee: Secondary | ICD-10-CM

## 2012-05-23 DIAGNOSIS — R0602 Shortness of breath: Secondary | ICD-10-CM | POA: Insufficient documentation

## 2012-05-23 DIAGNOSIS — M1710 Unilateral primary osteoarthritis, unspecified knee: Secondary | ICD-10-CM | POA: Insufficient documentation

## 2012-05-23 DIAGNOSIS — M25569 Pain in unspecified knee: Secondary | ICD-10-CM

## 2012-05-23 DIAGNOSIS — I890 Lymphedema, not elsewhere classified: Secondary | ICD-10-CM | POA: Insufficient documentation

## 2012-05-23 DIAGNOSIS — M47817 Spondylosis without myelopathy or radiculopathy, lumbosacral region: Secondary | ICD-10-CM | POA: Insufficient documentation

## 2012-05-23 DIAGNOSIS — R5381 Other malaise: Secondary | ICD-10-CM | POA: Insufficient documentation

## 2012-05-23 DIAGNOSIS — G8929 Other chronic pain: Secondary | ICD-10-CM | POA: Insufficient documentation

## 2012-05-23 DIAGNOSIS — M1711 Unilateral primary osteoarthritis, right knee: Secondary | ICD-10-CM

## 2012-05-23 MED ORDER — MORPHINE SULFATE ER 15 MG PO TBCR: 15.0000 mg | EXTENDED_RELEASE_TABLET | Freq: Two times a day (BID) | ORAL | Status: DC

## 2012-05-23 MED ORDER — OXYCODONE-ACETAMINOPHEN 7.5-500 MG PO TABS: 1.0000 | ORAL_TABLET | Freq: Three times a day (TID) | ORAL | Status: DC | PRN

## 2012-05-23 NOTE — Progress Notes (Signed)
Subjective:    Patient ID: Blake Summers, male    DOB: March 30, 1946, 66 y.o.   MRN: [id number]  HPI The patient complains about chronic right knee pain. The patient denies any radiation. The patient also complains about an open wound on his left lower leg, which is treated by the wound care center. He states the wound is healing well.  The knee problem has been stable, his open wound is healing. The patient had to interrupt his physical therapy in the water because of the open wound. The patient states, that he had difficulties breathing, he saw his pulmonologist, and is now on O2. He reports that a HH-nurse was visiting him and educated him about his diabetis, medication and diet. He is now eating more healthy. He also reports, that the calf pumps have really improved the edema on both of his legs.  Pain Inventory Average Pain 7 Pain Right Now 7 My pain is sharp and stabbing  In the last 24 hours, has pain interfered with the following? General activity 9 Relation with others 8 Enjoyment of life 8 What TIME of day is your pain at its worst? daytime Sleep (in general) Poor  Pain is worse with: walking and standing Pain improves with: rest, heat/ice, medication and injections Relief from Meds: 7  Mobility walk with assistance use a cane use a walker ability to climb steps?  yes do you drive?  yes use a wheelchair needs help with transfers  Function retired I need assistance with the following:  dressing, meal prep, household duties and shopping  Neuro/Psych numbness tingling trouble walking dizziness  Prior Studies Any changes since last visit?  no  Physicians involved in your care Any changes since last visit?  no   Family History  Problem Relation Age of Onset  . Cancer Brother     Colon Cancer  . Heart disease Brother   . Heart disease Brother   . Asthma Mother    History   Social History  . Marital Status: Married    Spouse Name: N/A    Number of  Children: N/A  . Years of Education: N/A   Occupational History  . Retired     Nature conservation officer   Social History Main Topics  . Smoking status: Former Smoker -- 0.5 packs/day for 15 years    Types: Cigarettes    Quit date: 06/12/1988  . Smokeless tobacco: Never Used  . Alcohol Use: No  . Drug Use: No  . Sexually Active: None   Other Topics Concern  . None   Social History Narrative   Diet is "good"Exercise is limited by medical problems   Past Surgical History  Procedure Date  . Lithotripsy 2007   Past Medical History  Diagnosis Date  . GERD 07/06/2010  . Edema 10/28/2007  . DIABETES MELLITUS, TYPE II 05/04/2007  . HYPERLIPIDEMIA 07/08/2008  . ALLERGIC RHINITIS 07/08/2008  . GLAUCOMA 07/08/2008  . CEREBROVASCULAR ACCIDENT, HX OF 07/08/2008  . OBSTRUCTIVE SLEEP APNEA 03/06/2008  . DIABETIC ULCER, LEFT LEG 08/23/2009  . 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 HYPERTENSION 10/28/2007  . VENOUS INSUFFICIENCY 03/08/2009  . FATTY LIVER DISEASE 05/19/2008  . BENIGN PROSTATIC HYPERTROPHY 07/08/2008  . 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 NEPHROLITHIASIS, HX OF 07/08/2008  . Morbid obesity   . DM nephropathy/sclerosis   . 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 Lumbar spondylosis    BP 133/60  Pulse 83  Resp 14  Ht 5'  6" (1.676 m)  Wt 350 lb (158.759 kg)  BMI 56.49 kg/m2  SpO2 90%    Review of Systems  Musculoskeletal: Positive for back pain and gait problem.  Neurological: Positive for numbness.       Tingling   All other systems reviewed and are negative.       Objective:   Physical Exam The patient is a morbidly obese gentleman  He is oriented x3. Speech is clear. Affect is bright. He is  alert, cooperative, and pleasant. Follows commands without difficulty.  Answers my questions appropriately.  NEUROLOGIC: Cranial nerves, coordination are intact. His reflexes are  diminished in the lower extremities. He has good strength in  both lower  extremities at hip flexors, knee extensors, dorsiflexors and  plantar flexors.  Sensation is intact in the lower extremities. He is  noted to have chronic swelling in lower extremities.  Right Knee  He has painful joint line over his right knee.(medial and lateral)Motion is preserved.  bilateral hip flexion contracture 5-10 degrees  Symmetric normal motor tone is noted throughout. Normal muscle bulk. Muscle testing reveals 5/5 muscle strength of the upper extremity, and 5/5 of the lower extremity.  DTR in the upper and lower extremity are present and symmetric 2+. No clonus is noted.  Patient is in a wheel chair.         Assessment & Plan:  1. End-stage right knee osteoarthritis. Prescribed Voltaren gel, currently using lidoderm as well as knee sleeve, walker  2. Morbid obesity.  3. Generally deconditioned.  4. Chronic low back pain/spondylosis.  5. Chronic venous insufficiency/lymphedema.  6. SOB, O2 dependent  Continue following up with the wound care center for his open wound on the left lower leg. Try to go back to aquatic physical therapy when the wound has healed.Advised patient to do exercises at home, in a sitting or recumbent position, which I showed him today. Continue with his calf pumps.  Filled his medication today.  Follow up in 1 month

## 2012-05-23 NOTE — Patient Instructions (Signed)
Continue with staying as active as tolerated, continue with exercising in a sitting position.

## 2012-05-27 ENCOUNTER — Other Ambulatory Visit: Payer: Self-pay

## 2012-05-27 MED ORDER — LOVASTATIN 40 MG PO TABS: 40.0000 mg | ORAL_TABLET | Freq: Every day | ORAL | Status: DC

## 2012-05-28 ENCOUNTER — Telehealth: Payer: Self-pay | Admitting: Pulmonary Disease

## 2012-05-29 MED ORDER — DEXLANSOPRAZOLE 60 MG PO CPDR: 60.0000 mg | DELAYED_RELEASE_CAPSULE | Freq: Every day | ORAL | Status: DC

## 2012-05-29 NOTE — Telephone Encounter (Signed)
RX sent

## 2012-06-19 ENCOUNTER — Telehealth: Payer: Self-pay | Admitting: Pulmonary Disease

## 2012-06-19 NOTE — Telephone Encounter (Signed)
I called pt back and read the order from 06-07-12 that reads" RESMED SWIFT FX NASAL PILLOWS - LARGE. Pt wrote this down to give [facility]. He will call if anything further is needed. Hazel Sams

## 2012-06-21 ENCOUNTER — Encounter: Payer: Self-pay | Admitting: Physical Medicine and Rehabilitation

## 2012-06-21 ENCOUNTER — Encounter
Payer: Medicare Other | Attending: Physical Medicine and Rehabilitation | Admitting: Physical Medicine and Rehabilitation

## 2012-06-21 VITALS — BP 144/63 | HR 90 | Resp 16 | Ht 66.0 in | Wt 350.0 lb

## 2012-06-21 DIAGNOSIS — M1710 Unilateral primary osteoarthritis, unspecified knee: Secondary | ICD-10-CM | POA: Insufficient documentation

## 2012-06-21 DIAGNOSIS — M1711 Unilateral primary osteoarthritis, right knee: Secondary | ICD-10-CM

## 2012-06-21 DIAGNOSIS — M545 Low back pain, unspecified: Secondary | ICD-10-CM

## 2012-06-21 DIAGNOSIS — I872 Venous insufficiency (chronic) (peripheral): Secondary | ICD-10-CM | POA: Insufficient documentation

## 2012-06-21 DIAGNOSIS — M47817 Spondylosis without myelopathy or radiculopathy, lumbosacral region: Secondary | ICD-10-CM | POA: Insufficient documentation

## 2012-06-21 DIAGNOSIS — R5381 Other malaise: Secondary | ICD-10-CM | POA: Insufficient documentation

## 2012-06-21 DIAGNOSIS — I890 Lymphedema, not elsewhere classified: Secondary | ICD-10-CM | POA: Insufficient documentation

## 2012-06-21 DIAGNOSIS — IMO0002 Reserved for concepts with insufficient information to code with codable children: Secondary | ICD-10-CM

## 2012-06-21 DIAGNOSIS — R0602 Shortness of breath: Secondary | ICD-10-CM | POA: Insufficient documentation

## 2012-06-21 DIAGNOSIS — G8929 Other chronic pain: Secondary | ICD-10-CM | POA: Insufficient documentation

## 2012-06-21 DIAGNOSIS — Z9981 Dependence on supplemental oxygen: Secondary | ICD-10-CM | POA: Insufficient documentation

## 2012-06-21 MED ORDER — OXYCODONE-ACETAMINOPHEN 7.5-500 MG PO TABS: 1.0000 | ORAL_TABLET | Freq: Three times a day (TID) | ORAL | Status: DC | PRN

## 2012-06-21 MED ORDER — MORPHINE SULFATE ER 15 MG PO TBCR: 15.0000 mg | EXTENDED_RELEASE_TABLET | Freq: Two times a day (BID) | ORAL | Status: DC

## 2012-06-21 NOTE — Patient Instructions (Signed)
You can try magnesium powder to help with your constipation.

## 2012-06-21 NOTE — Progress Notes (Signed)
Subjective:    Patient ID: Blake Summers, male    DOB: 06-20-1945, 67 y.o.   MRN: [id number]  HPI The patient complains about chronic right knee pain. The patient denies any radiation. The patient also complains about an open wound on his left lower leg, which is treated by the wound care center. He states the wound is healing well.  The knee problem has been stable, his open wound is healing. The patient had to interrupt his physical therapy in the water because of the open wound. The patient states, that he had difficulties breathing, he saw his pulmonologist, and is now on O2. He reports that a HH-nurse was visiting him and educated him about his diabetis, medication and diet. He is now eating more healthy. He also reports, that the calf pumps have really improved the edema on both of his legs.  Pain Inventory Average Pain 8 Pain Right Now 7 My pain is sharp and stabbing  In the last 24 hours, has pain interfered with the following? General activity 9 Relation with others 8 Enjoyment of life 9 What TIME of day is your pain at its worst? daytime Sleep (in general) Poor  Pain is worse with: walking and standing Pain improves with: rest, heat/ice, medication and injections Relief from Meds: 7  Mobility walk with assistance use a cane use a walker ability to climb steps?  yes do you drive?  yes use a wheelchair needs help with transfers  Function retired I need assistance with the following:  dressing, meal prep, household duties and shopping  Neuro/Psych numbness tingling trouble walking dizziness  Prior Studies Any changes since last visit?  no  Physicians involved in your care Any changes since last visit?  no   Family History  Problem Relation Age of Onset  . Cancer Brother     Colon Cancer  . Heart disease Brother   . Heart disease Brother   . Asthma Mother    History   Social History  . Marital Status: Married    Spouse Name: N/A    Number of  Children: N/A  . Years of Education: N/A   Occupational History  . Retired     Nature conservation officer   Social History Main Topics  . Smoking status: Former Smoker -- 0.5 packs/day for 15 years    Types: Cigarettes    Quit date: 06/12/1988  . Smokeless tobacco: Never Used  . Alcohol Use: No  . Drug Use: No  . Sexually Active: None   Other Topics Concern  . None   Social History Narrative   Diet is "good"Exercise is limited by medical problems   Past Surgical History  Procedure Date  . Lithotripsy 2007   Past Medical History  Diagnosis Date  . GERD 07/06/2010  . Edema 10/28/2007  . DIABETES MELLITUS, TYPE II 05/04/2007  . HYPERLIPIDEMIA 07/08/2008  . ALLERGIC RHINITIS 07/08/2008  . GLAUCOMA 07/08/2008  . CEREBROVASCULAR ACCIDENT, HX OF 07/08/2008  . OBSTRUCTIVE SLEEP APNEA 03/06/2008  . DIABETIC ULCER, LEFT LEG 08/23/2009  . 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 HYPERTENSION 10/28/2007  . VENOUS INSUFFICIENCY 03/08/2009  . FATTY LIVER DISEASE 05/19/2008  . BENIGN PROSTATIC HYPERTROPHY 07/08/2008  . 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 NEPHROLITHIASIS, HX OF 07/08/2008  . Morbid obesity   . DM nephropathy/sclerosis   . 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 Lumbar spondylosis    BP 144/63  Pulse 90  Resp 16  Ht 5'  6" (1.676 m)  Wt 350 lb (158.759 kg)  BMI 56.49 kg/m2  SpO2 93%     Review of Systems  Cardiovascular: Positive for leg swelling.  Musculoskeletal: Positive for back pain and gait problem.  Neurological: Positive for numbness.  All other systems reviewed and are negative.       Objective:   Physical Exam The patient is a morbidly obese gentleman  He is oriented x3. Speech is clear. Affect is bright. He is  alert, cooperative, and pleasant. Follows commands without difficulty.  Answers my questions appropriately.  NEUROLOGIC: Cranial nerves, coordination are intact. His reflexes are  diminished in the lower extremities. He has  good strength in  both lower extremities at hip flexors, knee extensors, dorsiflexors and  plantar flexors.  Sensation is intact in the lower extremities. He is  noted to have chronic swelling in lower extremities.  Right Knee  He has painful joint line over his right knee.(medial and lateral)Motion is preserved.  bilateral hip flexion contracture 5-10 degrees  Symmetric normal motor tone is noted throughout. Normal muscle bulk. Muscle testing reveals 5/5 muscle strength of the upper extremity, and 5/5 of the lower extremity.  DTR in the upper and lower extremity are present and symmetric 2+. No clonus is noted.  Patient is in a wheel chair.         Assessment & Plan:  1. End-stage right knee osteoarthritis. Prescribed Voltaren gel, currently using lidoderm as well as knee sleeve, walker  2. Morbid obesity.  3. Generally deconditioned.  4. Chronic low back pain/spondylosis.  5. Chronic venous insufficiency/lymphedema.  6. SOB, O2 dependent  Continue following up with the wound care center for his open wound on the left lower leg. Try to go back to aquatic physical therapy when the wound has healed.Advised patient to do exercises at home, in a sitting or recumbent position, which I showed him today. Continue with his calf pumps.  He complains about constipation, he is already taking laxatives, I recommended to try magnesium powder, if this does not improve his constipation he should follow up with his PCP or gastroenterologist. Filled his medication today.  Follow up in 1 month

## 2012-07-04 ENCOUNTER — Ambulatory Visit: Payer: Medicare Other | Admitting: Endocrinology

## 2012-07-05 ENCOUNTER — Other Ambulatory Visit: Payer: Self-pay | Admitting: *Deleted

## 2012-07-05 ENCOUNTER — Other Ambulatory Visit: Payer: Self-pay | Admitting: Physical Medicine and Rehabilitation

## 2012-07-05 ENCOUNTER — Ambulatory Visit (INDEPENDENT_AMBULATORY_CARE_PROVIDER_SITE_OTHER): Payer: Medicare Other | Admitting: Endocrinology

## 2012-07-05 VITALS — BP 124/74 | HR 100 | Wt 351.0 lb

## 2012-07-05 DIAGNOSIS — E291 Testicular hypofunction: Secondary | ICD-10-CM

## 2012-07-05 DIAGNOSIS — E119 Type 2 diabetes mellitus without complications: Secondary | ICD-10-CM

## 2012-07-05 MED ORDER — LOSARTAN POTASSIUM-HCTZ 100-25 MG PO TABS: 1.0000 | ORAL_TABLET | Freq: Every day | ORAL | Status: DC

## 2012-07-05 MED ORDER — DOCUSATE SODIUM 100 MG PO CAPS: 100.0000 mg | ORAL_CAPSULE | Freq: Two times a day (BID) | ORAL | Status: DC

## 2012-07-05 NOTE — Patient Instructions (Addendum)
blood tests are being requested for you today.  We'll contact you with results.   Please reduce the NPH to 80 units at bedtime.  Please come back for a regular physical appointment in 3 months.   check your blood sugar twice a day.  vary the time of day when you check, between before the 3 meals, and at bedtime.  also check if you have symptoms of your blood sugar being too high or too low.  please keep a record of the readings and bring it to your next appointment here.  please call us sooner if your blood sugar goes below 70, or if you have a lot of readings over 200.   Please increase the miralax to twice a day.

## 2012-07-05 NOTE — Progress Notes (Signed)
Subjective:    Patient ID: Blake Summers, male    DOB: 1945-06-17, 67 y.o.   MRN: [id number]  HPI The state of at least three ongoing medical problems is addressed today, with interval history of each noted here: Pt returns for f/u of insulin-requiring DM (dx'ed 1990; complicated by peripheral sensory neuropathy, mild CAD, CVA, and foot ulcer).   pt states he feels well in general.  he brings a record of his cbg's which i have reviewed today.  It varies from 70-170, but most are approx 100.  It is in general higher as the day goes on, but not necessarily so.  denies hypoglycemia.   Hypogonadism: he still has fatigue, despite the increased testim.   Constipation: this is exac by pain meds.  Pt say this persists despite miralax Past Medical History  Diagnosis Date  . GERD 07/06/2010  . Edema 10/28/2007  . DIABETES MELLITUS, TYPE II 05/04/2007  . HYPERLIPIDEMIA 07/08/2008  . ALLERGIC RHINITIS 07/08/2008  . GLAUCOMA 07/08/2008  . CEREBROVASCULAR ACCIDENT, HX OF 07/08/2008  . OBSTRUCTIVE SLEEP APNEA 03/06/2008  . DIABETIC ULCER, LEFT LEG 08/23/2009  . 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 HYPERTENSION 10/28/2007  . VENOUS INSUFFICIENCY 03/08/2009  . FATTY LIVER DISEASE 05/19/2008  . BENIGN PROSTATIC HYPERTROPHY 07/08/2008  . 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 NEPHROLITHIASIS, HX OF 07/08/2008  . Morbid obesity   . DM nephropathy/sclerosis   . 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 Lumbar spondylosis     Past Surgical History  Procedure Date  . Lithotripsy 2007    History   Social History  . Marital Status: Married    Spouse Name: N/A    Number of Children: N/A  . Years of Education: N/A   Occupational History  . Retired     Nature conservation officer   Social History Main Topics  . Smoking status: Former Smoker -- 0.5 packs/day for 15 years    Types: Cigarettes    Quit date: 06/12/1988  . Smokeless tobacco: Never Used  . Alcohol Use: No  . Drug Use: No  .  Sexually Active: Not on file   Other Topics Concern  . Not on file   Social History Narrative   Diet is "good"Exercise is limited by medical problems    Current Outpatient Prescriptions on File Prior to Visit  Medication Sig Dispense Refill  . AGGRENOX 25-200 MG per 12 hr capsule TAKE 1 CAPSULE BY MOUTH 2 TIMES         DAILY  60 each  5  . allopurinol (ZYLOPRIM) 300 MG tablet Take 1 tablet (300 mg total) by mouth 1 day or 1 dose.  30 tablet  5  . brimonidine (ALPHAGAN) 0.15 % ophthalmic solution as directed.      . clobetasol ointment (TEMOVATE) 0.05 % Apply topically 3 (three) times daily.  60 g  3  . dexlansoprazole (DEXILANT) 60 MG capsule Take 1 capsule (60 mg total) by mouth daily.  30 capsule  2  . diltiazem (CARDIZEM CD) 180 MG 24 hr capsule TAKE 2 CAPSULES BY MOUTH DAILY  60 capsule  5  . dorzolamide-timolol (COSOPT) 22.3-6.8 MG/ML ophthalmic solution as directed.      . furosemide (LASIX) 80 MG tablet Take 1 tablet (80 mg total) by mouth daily.  30 tablet  5  . glucose blood (ONE TOUCH TEST STRIPS) test strip Check blood sugar qid       . insulin lispro (HUMALOG KWIKPEN) 100  UNIT/ML injection Inject subcutaneously 3x a day (just before each meal) 90-120-160 units  105 mL  5  . insulin NPH (HUMULIN N,NOVOLIN N) 100 UNIT/ML injection Inject 80 Units into the skin at bedtime.      Marland Kitchen ketoconazole (NIZORAL) 2 % shampoo as directed.      . latanoprost (XALATAN) 0.005 % ophthalmic solution Place 1 drop into both eyes at bedtime.  2.5 mL  1  . lidocaine (LIDODERM) 5 % Place 1-3 patches onto the skin daily. Remove & Discard patch within 12 hours or as directed by MD      . loratadine-pseudoephedrine (CLARITIN-D 12 HOUR) 5-120 MG per tablet Take 1 tablet by mouth 2 (two) times daily.  60 tablet  5  . losartan-hydrochlorothiazide (HYZAAR) 100-25 MG per tablet Take 1 tablet by mouth daily.  30 tablet  5  . lovastatin (MEVACOR) 40 MG tablet Take 1 tablet (40 mg total) by mouth at bedtime.  60  tablet  3  . methocarbamol (ROBAXIN) 500 MG tablet TAKE 1 TABLET BY MOUTH EVERY NIGHT AT   BEDTIME AS NEEDED FOR CRAMPS  30 tablet  5  . morphine (MS CONTIN) 15 MG 12 hr tablet Take 1 tablet (15 mg total) by mouth 2 (two) times daily.  60 tablet  0  . naproxen sodium (ANAPROX) 220 MG tablet 2 tablets by mouth once daily as needed for pain       . NOVOFINE 30G X 8 MM MISC USE 4 TIMES A DAY.  100 each  6  . oxyCODONE-acetaminophen (PERCOCET) 7.5-500 MG per tablet Take 1 tablet by mouth every 8 (eight) hours as needed for pain. 1 tablet by mouth three times a day as needed  90 tablet  0  . potassium chloride SA (KLOR-CON M20) 20 MEQ tablet Take 1 tablet (20 mEq total) by mouth 2 (two) times daily.  60 tablet  11  . PROVENTIL HFA 108 (90 BASE) MCG/ACT inhaler ONE PUFF EVERY 4 HOURS AS NEEDED  6.7 g  5  . ranitidine (ZANTAC) 150 MG tablet 2  At bedtime  60 tablet  5  . topiramate (TOPAMAX) 50 MG tablet Take 1 tablet (50 mg total) by mouth 2 (two) times daily.  60 tablet  1  . zolpidem (AMBIEN) 10 MG tablet Take 10 mg by mouth at bedtime as needed.          Allergies  Allergen Reactions  . Neosporin (Neomycin-Polymyxin-Gramicidin)     Family History  Problem Relation Age of Onset  . Cancer Brother     Colon Cancer  . Heart disease Brother   . Heart disease Brother   . Asthma Mother     BP 124/74  Pulse 100  Wt 351 lb (159.213 kg)  SpO2 95%  Review of Systems Denies decreased urinary stream    Objective:   Physical Exam VITAL SIGNS:  See vs page GENERAL: no distress.  Morbid obesity.  In wheelchair.   GENITALIA: Normal male testicles, scrotum, and penis Ext: sees [facility].  Lab Results  Component Value Date   HGBA1C 7.0* 07/05/2012   Lab Results  Component Value Date   TESTOSTERONE 157.68* 07/05/2012      Assessment & Plan:  Hypogonadism, needs increased rx. DM: well-controlled. Constipation: needs increased rx

## 2012-07-22 ENCOUNTER — Encounter: Payer: Self-pay | Admitting: Physical Medicine and Rehabilitation

## 2012-07-22 ENCOUNTER — Encounter
Payer: Medicare Other | Attending: Physical Medicine and Rehabilitation | Admitting: Physical Medicine and Rehabilitation

## 2012-07-22 ENCOUNTER — Other Ambulatory Visit: Payer: Self-pay | Admitting: Endocrinology

## 2012-07-22 ENCOUNTER — Telehealth: Payer: Self-pay

## 2012-07-22 VITALS — BP 156/69 | HR 94 | Resp 14 | Ht 66.0 in | Wt 350.0 lb

## 2012-07-22 DIAGNOSIS — I890 Lymphedema, not elsewhere classified: Secondary | ICD-10-CM | POA: Insufficient documentation

## 2012-07-22 DIAGNOSIS — M47817 Spondylosis without myelopathy or radiculopathy, lumbosacral region: Secondary | ICD-10-CM | POA: Insufficient documentation

## 2012-07-22 DIAGNOSIS — G8929 Other chronic pain: Secondary | ICD-10-CM | POA: Insufficient documentation

## 2012-07-22 DIAGNOSIS — Z9981 Dependence on supplemental oxygen: Secondary | ICD-10-CM | POA: Insufficient documentation

## 2012-07-22 DIAGNOSIS — I872 Venous insufficiency (chronic) (peripheral): Secondary | ICD-10-CM | POA: Insufficient documentation

## 2012-07-22 DIAGNOSIS — M1710 Unilateral primary osteoarthritis, unspecified knee: Secondary | ICD-10-CM | POA: Insufficient documentation

## 2012-07-22 DIAGNOSIS — IMO0002 Reserved for concepts with insufficient information to code with codable children: Secondary | ICD-10-CM

## 2012-07-22 DIAGNOSIS — R0602 Shortness of breath: Secondary | ICD-10-CM | POA: Insufficient documentation

## 2012-07-22 DIAGNOSIS — M25569 Pain in unspecified knee: Secondary | ICD-10-CM

## 2012-07-22 DIAGNOSIS — Z5181 Encounter for therapeutic drug level monitoring: Secondary | ICD-10-CM

## 2012-07-22 MED ORDER — TESTOSTERONE 50 MG/5GM (1%) TD GEL: TRANSDERMAL | Status: DC

## 2012-07-22 MED ORDER — MORPHINE SULFATE ER 15 MG PO TBCR: 15.0000 mg | EXTENDED_RELEASE_TABLET | Freq: Two times a day (BID) | ORAL | Status: DC

## 2012-07-22 MED ORDER — OXYCODONE-ACETAMINOPHEN 7.5-500 MG PO TABS: 1.0000 | ORAL_TABLET | Freq: Three times a day (TID) | ORAL | Status: DC | PRN

## 2012-07-22 NOTE — Telephone Encounter (Signed)
Needs RF - Testosterone. Uses [facility]. Pt CB# [id number] / SRS

## 2012-07-22 NOTE — Progress Notes (Signed)
Subjective:    Patient ID: Blake Summers, male    DOB: 1945/10/02, 67 y.o.   MRN: [id number]  HPI The patient complains about chronic right knee pain. The patient denies any radiation. The patient also complains about an open wound on his left lower leg, which is treated by the wound care center. He states the wound is healing well.  The knee problem has been stable, his open wound is healing. The patient had to interrupt his physical therapy in the water because of the open wound. The patient states, that he had difficulties breathing, he saw his pulmonologist, and is now on O2. He reports that a HH-nurse was visiting him and educated him about his diabetis, medication and diet. He is now eating more healthy. He also reports, that the calf pumps have really improved the edema on both of his legs.  Pain Inventory Average Pain 8 Pain Right Now 7 My pain is sharp and stabbing  In the last 24 hours, has pain interfered with the following? General activity 8 Relation with others 8 Enjoyment of life 8 What TIME of day is your pain at its worst? daytime Sleep (in general) Poor  Pain is worse with: walking and standing Pain improves with: rest, heat/ice, medication and injections Relief from Meds: 7  Mobility walk with assistance use a cane use a walker ability to climb steps?  yes do you drive?  yes use a wheelchair needs help with transfers  Function retired I need assistance with the following:  meal prep, household duties and shopping  Neuro/Psych numbness tingling trouble walking dizziness  Prior Studies Any changes since last visit?  no  Physicians involved in your care Any changes since last visit?  no   Family History  Problem Relation Age of Onset  . Cancer Brother     Colon Cancer  . Heart disease Brother   . Heart disease Brother   . Asthma Mother    History   Social History  . Marital Status: Married    Spouse Name: N/A    Number of Children: N/A   . Years of Education: N/A   Occupational History  . Retired     Nature conservation officer   Social History Main Topics  . Smoking status: Former Smoker -- 0.50 packs/day for 15 years    Types: Cigarettes    Quit date: 06/12/1988  . Smokeless tobacco: Never Used  . Alcohol Use: No  . Drug Use: No  . Sexually Active: None   Other Topics Concern  . None   Social History Narrative   Diet is "good"   Exercise is limited by medical problems   Past Surgical History  Procedure Laterality Date  . Lithotripsy  2007   Past Medical History  Diagnosis Date  . GERD 07/06/2010  . Edema 10/28/2007  . DIABETES MELLITUS, TYPE II 05/04/2007  . HYPERLIPIDEMIA 07/08/2008  . ALLERGIC RHINITIS 07/08/2008  . GLAUCOMA 07/08/2008  . CEREBROVASCULAR ACCIDENT, HX OF 07/08/2008  . OBSTRUCTIVE SLEEP APNEA 03/06/2008  . DIABETIC ULCER, LEFT LEG 08/23/2009  . 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 HYPERTENSION 10/28/2007  . VENOUS INSUFFICIENCY 03/08/2009  . FATTY LIVER DISEASE 05/19/2008  . BENIGN PROSTATIC HYPERTROPHY 07/08/2008  . 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 NEPHROLITHIASIS, HX OF 07/08/2008  . Morbid obesity   . DM nephropathy/sclerosis   . 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 Lumbar spondylosis    BP 156/69  Pulse 94  Resp  14  Ht 5\' 6"  (1.676 m)  Wt 350 lb (158.759 kg)  BMI 56.52 kg/m2  SpO2 94%     Review of Systems  Musculoskeletal: Positive for gait problem.  Neurological: Positive for dizziness and numbness.  All other systems reviewed and are negative.       Objective:   Physical Exam The patient is a morbidly obese gentleman  He is oriented x3. Speech is clear. Affect is bright. He is  alert, cooperative, and pleasant. Follows commands without difficulty.  Answers my questions appropriately.  NEUROLOGIC: Cranial nerves, coordination are intact. His reflexes are  diminished in the lower extremities. He has good strength in  both lower extremities at  hip flexors, knee extensors, dorsiflexors and  plantar flexors.  Sensation is intact in the lower extremities. He is  noted to have chronic swelling in lower extremities.  Right Knee  He has painful joint line over his right knee.(medial and lateral)Motion is preserved.  Right knee flexion contracture 5-10 degrees  Symmetric normal motor tone is noted throughout. Normal muscle bulk. Muscle testing reveals 5/5 muscle strength of the upper extremity, and 5/5 of the lower extremity.  DTR in the upper and lower extremity are present and symmetric 2+. No clonus is noted.  Patient is in a wheel chair.        Assessment & Plan:  1. End-stage right knee osteoarthritis. Prescribed Voltaren gel, currently using lidoderm as well as knee sleeve, walker  2. Morbid obesity.  3. Generally deconditioned.  4. Chronic low back pain/spondylosis.  5. Chronic venous insufficiency/lymphedema.  6. SOB, O2 dependent  Continue following up with the wound care center for his open wound on the left lower leg. Try to go back to aquatic physical therapy when the wound has healed.Advised patient to do exercises at home, in a sitting or recumbent position, which I showed him today. Continue with his calf pumps.    Filled his medication today, to be filled when due.  Follow up in 1 month

## 2012-07-22 NOTE — Addendum Note (Signed)
Addended by: Elnora Morrison on: 07/22/2012 12:01 PM   Modules accepted: Orders

## 2012-07-22 NOTE — Patient Instructions (Signed)
Try to stay as active as pain allows, continue with exercising in a sitting or lying position.

## 2012-07-22 NOTE — Telephone Encounter (Signed)
Rx FAXED TO [facility].

## 2012-08-01 ENCOUNTER — Other Ambulatory Visit: Payer: Self-pay | Admitting: *Deleted

## 2012-08-01 MED ORDER — ASPIRIN-DIPYRIDAMOLE ER 25-200 MG PO CP12: ORAL_CAPSULE | ORAL | Status: DC

## 2012-08-01 MED ORDER — DILTIAZEM HCL ER COATED BEADS 180 MG PO CP24: ORAL_CAPSULE | ORAL | Status: DC

## 2012-08-01 MED ORDER — INSULIN LISPRO 100 UNIT/ML ~~LOC~~ SOLN: SUBCUTANEOUS | Status: DC

## 2012-08-01 MED ORDER — LATANOPROST 0.005 % OP SOLN: 1.0000 [drp] | Freq: Every day | OPHTHALMIC | Status: DC

## 2012-08-05 ENCOUNTER — Other Ambulatory Visit: Payer: Self-pay | Admitting: *Deleted

## 2012-08-05 ENCOUNTER — Other Ambulatory Visit: Payer: Self-pay | Admitting: Pulmonary Disease

## 2012-08-05 MED ORDER — POLYETHYLENE GLYCOL 3350 17 GM/SCOOP PO POWD: ORAL | Status: DC

## 2012-08-05 MED ORDER — INSULIN PEN NEEDLE 30G X 8 MM MISC: Status: DC

## 2012-08-05 MED ORDER — METHOCARBAMOL 500 MG PO TABS: ORAL_TABLET | ORAL | Status: DC

## 2012-08-14 ENCOUNTER — Other Ambulatory Visit: Payer: Self-pay

## 2012-08-14 ENCOUNTER — Encounter: Payer: Self-pay | Admitting: Physical Medicine and Rehabilitation

## 2012-08-14 ENCOUNTER — Encounter
Payer: Medicare Other | Attending: Physical Medicine and Rehabilitation | Admitting: Physical Medicine and Rehabilitation

## 2012-08-14 VITALS — BP 153/62 | HR 87 | Resp 15 | Ht 67.0 in | Wt 357.8 lb

## 2012-08-14 DIAGNOSIS — X58XXXA Exposure to other specified factors, initial encounter: Secondary | ICD-10-CM | POA: Insufficient documentation

## 2012-08-14 DIAGNOSIS — M545 Low back pain, unspecified: Secondary | ICD-10-CM | POA: Insufficient documentation

## 2012-08-14 DIAGNOSIS — H409 Unspecified glaucoma: Secondary | ICD-10-CM | POA: Insufficient documentation

## 2012-08-14 DIAGNOSIS — R5381 Other malaise: Secondary | ICD-10-CM | POA: Insufficient documentation

## 2012-08-14 DIAGNOSIS — M25569 Pain in unspecified knee: Secondary | ICD-10-CM | POA: Insufficient documentation

## 2012-08-14 DIAGNOSIS — K219 Gastro-esophageal reflux disease without esophagitis: Secondary | ICD-10-CM | POA: Insufficient documentation

## 2012-08-14 DIAGNOSIS — E119 Type 2 diabetes mellitus without complications: Secondary | ICD-10-CM | POA: Insufficient documentation

## 2012-08-14 DIAGNOSIS — M1711 Unilateral primary osteoarthritis, right knee: Secondary | ICD-10-CM

## 2012-08-14 DIAGNOSIS — S91009A Unspecified open wound, unspecified ankle, initial encounter: Secondary | ICD-10-CM | POA: Insufficient documentation

## 2012-08-14 DIAGNOSIS — N058 Unspecified nephritic syndrome with other morphologic changes: Secondary | ICD-10-CM | POA: Insufficient documentation

## 2012-08-14 DIAGNOSIS — M1710 Unilateral primary osteoarthritis, unspecified knee: Secondary | ICD-10-CM

## 2012-08-14 DIAGNOSIS — G4733 Obstructive sleep apnea (adult) (pediatric): Secondary | ICD-10-CM | POA: Insufficient documentation

## 2012-08-14 DIAGNOSIS — E785 Hyperlipidemia, unspecified: Secondary | ICD-10-CM | POA: Insufficient documentation

## 2012-08-14 DIAGNOSIS — R0602 Shortness of breath: Secondary | ICD-10-CM | POA: Insufficient documentation

## 2012-08-14 DIAGNOSIS — M47817 Spondylosis without myelopathy or radiculopathy, lumbosacral region: Secondary | ICD-10-CM | POA: Insufficient documentation

## 2012-08-14 DIAGNOSIS — E1129 Type 2 diabetes mellitus with other diabetic kidney complication: Secondary | ICD-10-CM | POA: Insufficient documentation

## 2012-08-14 DIAGNOSIS — I890 Lymphedema, not elsewhere classified: Secondary | ICD-10-CM | POA: Insufficient documentation

## 2012-08-14 DIAGNOSIS — G8929 Other chronic pain: Secondary | ICD-10-CM | POA: Insufficient documentation

## 2012-08-14 DIAGNOSIS — Z9981 Dependence on supplemental oxygen: Secondary | ICD-10-CM | POA: Insufficient documentation

## 2012-08-14 DIAGNOSIS — Z8673 Personal history of transient ischemic attack (TIA), and cerebral infarction without residual deficits: Secondary | ICD-10-CM | POA: Insufficient documentation

## 2012-08-14 DIAGNOSIS — I10 Essential (primary) hypertension: Secondary | ICD-10-CM | POA: Insufficient documentation

## 2012-08-14 DIAGNOSIS — I872 Venous insufficiency (chronic) (peripheral): Secondary | ICD-10-CM | POA: Insufficient documentation

## 2012-08-14 DIAGNOSIS — Z6841 Body Mass Index (BMI) 40.0 and over, adult: Secondary | ICD-10-CM | POA: Insufficient documentation

## 2012-08-14 DIAGNOSIS — M25561 Pain in right knee: Secondary | ICD-10-CM

## 2012-08-14 DIAGNOSIS — S81009A Unspecified open wound, unspecified knee, initial encounter: Secondary | ICD-10-CM | POA: Insufficient documentation

## 2012-08-14 MED ORDER — TOPIRAMATE 50 MG PO TABS: 50.0000 mg | ORAL_TABLET | Freq: Two times a day (BID) | ORAL | Status: DC

## 2012-08-14 MED ORDER — OXYCODONE-ACETAMINOPHEN 7.5-500 MG PO TABS: 1.0000 | ORAL_TABLET | Freq: Three times a day (TID) | ORAL | Status: DC | PRN

## 2012-08-14 MED ORDER — MORPHINE SULFATE ER 15 MG PO TBCR: 15.0000 mg | EXTENDED_RELEASE_TABLET | Freq: Two times a day (BID) | ORAL | Status: DC

## 2012-08-14 NOTE — Progress Notes (Signed)
Subjective:    Patient ID: Blake Summers, male    DOB: [birth year], 67 y.o.   MRN: [id number]  HPI The patient complains about chronic right knee pain. The patient denies any radiation. The patient also complains about an open wound on his left lower leg, which is treated by the wound care center. He states the wound is healing well.  The knee problem has been stable, his open wound is healing. The patient had to interrupt his physical therapy in the water because of the open wound. The patient states, that he had difficulties breathing, he saw his pulmonologist, and is now on O2. He reports that a HH-nurse was visiting him and educated him about his diabetis, medication and diet. He is now eating more healthy. He also reports, that the calf pumps have really improved the edema on both of his legs.  Pain Inventory Average Pain 8 Pain Right Now 7 My pain is sharp and stabbing  In the last 24 hours, has pain interfered with the following? General activity 8 Relation with others 9 Enjoyment of life 8 What TIME of day is your pain at its worst? daytime Sleep (in general) Poor  Pain is worse with: walking and standing Pain improves with: rest, heat/ice, medication and injections Relief from Meds: 7  Mobility walk with assistance use a cane use a walker Do you have any goals in this area?  no  Function retired I need assistance with the following:  dressing, meal prep, household duties and shopping Do you have any goals in this area?  no  Neuro/Psych weakness numbness tingling trouble walking dizziness  Prior Studies Any changes since last visit?  no  Physicians involved in your care Any changes since last visit?  no   Family History  Problem Relation Age of Onset  . Cancer Brother     Colon Cancer  . Heart disease Brother   . Heart disease Brother   . Asthma Mother    History   Social History  . Marital Status: Married    Spouse Name: N/A    Number of  Children: N/A  . Years of Education: N/A   Occupational History  . Retired     Nature conservation officer   Social History Main Topics  . Smoking status: Former Smoker -- 0.50 packs/day for 15 years    Types: Cigarettes    Quit date: 06/12/1988  . Smokeless tobacco: Never Used  . Alcohol Use: No  . Drug Use: No  . Sexually Active: None   Other Topics Concern  . None   Social History Narrative   Diet is "good"   Exercise is limited by medical problems   Past Surgical History  Procedure Laterality Date  . Lithotripsy  2007   Past Medical History  Diagnosis Date  . GERD 07/06/2010  . Edema 10/28/2007  . DIABETES MELLITUS, TYPE II 05/04/2007  . HYPERLIPIDEMIA 07/08/2008  . ALLERGIC RHINITIS 07/08/2008  . GLAUCOMA 07/08/2008  . CEREBROVASCULAR ACCIDENT, HX OF 07/08/2008  . OBSTRUCTIVE SLEEP APNEA 03/06/2008  . DIABETIC ULCER, LEFT LEG 08/23/2009  . 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 HYPERTENSION 10/28/2007  . VENOUS INSUFFICIENCY 03/08/2009  . FATTY LIVER DISEASE 05/19/2008  . BENIGN PROSTATIC HYPERTROPHY 07/08/2008  . 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 NEPHROLITHIASIS, HX OF 07/08/2008  . Morbid obesity   . DM nephropathy/sclerosis   . 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 Lumbar spondylosis    BP 153/62  Pulse 87  Resp 15  Ht 5\' 7"  (1.702 m)  Wt 357 lb 12.8 oz (162.297 kg)  BMI 56.03 kg/m2  SpO2 89%      Review of Systems  Respiratory: Positive for cough and shortness of breath.        Cpap  Musculoskeletal: Positive for gait problem.  Neurological: Positive for dizziness, weakness and numbness.       Tingling  All other systems reviewed and are negative.       Objective:   Physical Exam The patient is a morbidly obese gentleman  He is oriented x3. Speech is clear. Affect is bright. He is  alert, cooperative, and pleasant. Follows commands without difficulty.  Answers my questions appropriately.  NEUROLOGIC: Cranial nerves,  coordination are intact. His reflexes are  diminished in the lower extremities. He has good strength in  both lower extremities at hip flexors, knee extensors, dorsiflexors and  plantar flexors.  Sensation is intact in the lower extremities. He is  noted to have chronic swelling in lower extremities.  Right Knee  He has painful joint line over his right knee.(medial and lateral)Motion is preserved.  Right knee flexion contracture 5-10 degrees  Symmetric normal motor tone is noted throughout. Normal muscle bulk. Muscle testing reveals 5/5 muscle strength of the upper extremity, and 5/5 of the lower extremity.  DTR in the upper and lower extremity are present and symmetric 2+. No clonus is noted.  Patient is in a wheel chair.        Assessment & Plan:  1. End-stage right knee osteoarthritis. Prescribed Voltaren gel, currently using lidoderm as well as knee sleeve, walker  2. Morbid obesity.  3. Generally deconditioned.  4. Chronic low back pain/spondylosis.  5. Chronic venous insufficiency/lymphedema.  6. SOB, O2 dependent  Continue following up with the wound care center for his open wound on the left lower leg. Try to go back to aquatic physical therapy when the wound has healed.Advised patient to do exercises at home, in a sitting or recumbent position, which I showed him today. Advised patient to re start aquatic exercises with his son , after his son is cleared to work out in the water again, after his knee surgery. Continue with his calf pumps.  Filled his medication today, to be filled when due.  Follow up in 6 weeks.

## 2012-08-14 NOTE — Patient Instructions (Addendum)
Stay as active as tolerated, continue with your exercises for your knee.  Find out whether your insurance will pay for the [place]aid drops, if they do not pay for the Voltaren gel anymore.

## 2012-08-23 ENCOUNTER — Other Ambulatory Visit: Payer: Self-pay

## 2012-08-23 MED ORDER — METFORMIN HCL ER 500 MG PO TB24: 500.0000 mg | ORAL_TABLET | Freq: Two times a day (BID) | ORAL | Status: DC

## 2012-09-24 ENCOUNTER — Encounter
Payer: Medicare Other | Attending: Physical Medicine and Rehabilitation | Admitting: Physical Medicine and Rehabilitation

## 2012-09-24 ENCOUNTER — Encounter: Payer: Self-pay | Admitting: Physical Medicine and Rehabilitation

## 2012-09-24 VITALS — BP 125/44 | HR 86 | Resp 16 | Ht 66.0 in | Wt 357.0 lb

## 2012-09-24 DIAGNOSIS — Z9981 Dependence on supplemental oxygen: Secondary | ICD-10-CM | POA: Insufficient documentation

## 2012-09-24 DIAGNOSIS — M25561 Pain in right knee: Secondary | ICD-10-CM

## 2012-09-24 DIAGNOSIS — I872 Venous insufficiency (chronic) (peripheral): Secondary | ICD-10-CM | POA: Insufficient documentation

## 2012-09-24 DIAGNOSIS — M1710 Unilateral primary osteoarthritis, unspecified knee: Secondary | ICD-10-CM | POA: Insufficient documentation

## 2012-09-24 DIAGNOSIS — R0602 Shortness of breath: Secondary | ICD-10-CM | POA: Insufficient documentation

## 2012-09-24 DIAGNOSIS — R5381 Other malaise: Secondary | ICD-10-CM | POA: Insufficient documentation

## 2012-09-24 DIAGNOSIS — I890 Lymphedema, not elsewhere classified: Secondary | ICD-10-CM | POA: Insufficient documentation

## 2012-09-24 DIAGNOSIS — M25569 Pain in unspecified knee: Secondary | ICD-10-CM

## 2012-09-24 DIAGNOSIS — M1711 Unilateral primary osteoarthritis, right knee: Secondary | ICD-10-CM

## 2012-09-24 DIAGNOSIS — M47817 Spondylosis without myelopathy or radiculopathy, lumbosacral region: Secondary | ICD-10-CM | POA: Insufficient documentation

## 2012-09-24 DIAGNOSIS — G8929 Other chronic pain: Secondary | ICD-10-CM | POA: Insufficient documentation

## 2012-09-24 MED ORDER — MORPHINE SULFATE ER 15 MG PO TBCR: 15.0000 mg | EXTENDED_RELEASE_TABLET | Freq: Two times a day (BID) | ORAL | Status: DC

## 2012-09-24 MED ORDER — OXYCODONE-ACETAMINOPHEN 7.5-500 MG PO TABS: 1.0000 | ORAL_TABLET | Freq: Three times a day (TID) | ORAL | Status: DC | PRN

## 2012-09-24 NOTE — Patient Instructions (Signed)
Try to restart your aquatic exercises.

## 2012-09-24 NOTE — Progress Notes (Signed)
Subjective:    Patient ID: Blake Summers, male    DOB: [birth year], 67 y.o.   MRN: [id number]  HPI The patient complains about chronic right knee pain. The patient denies any radiation. The patient also complains about an open wound on his left lower leg, which is treated by the wound care center. He states the wound is healing well.  The knee problem has been stable, his open wound is healing. The patient had to interrupt his physical therapy in the water because of the open wound. The patient states, that he had difficulties breathing, he saw his pulmonologist, and is now on O2. He reports that a HH-nurse was visiting him and educated him about his diabetis, medication and diet. He is now eating more healthy. He also reports, that the calf pumps have really improved the edema on both of his legs.  Pain Inventory Average Pain 8 Pain Right Now 8 My pain is sharp and stabbing  In the last 24 hours, has pain interfered with the following? General activity 8 Relation with others 9 Enjoyment of life 9 What TIME of day is your pain at its worst? daytime Sleep (in general) Poor  Pain is worse with: walking and standing Pain improves with: rest, heat/ice and injections Relief from Meds: 7  Mobility walk with assistance use a cane use a walker ability to climb steps?  yes do you drive?  yes use a wheelchair needs help with transfers  Function retired I need assistance with the following:  dressing, meal prep, household duties and shopping  Neuro/Psych numbness tingling trouble walking dizziness  Prior Studies Any changes since last visit?  no  Physicians involved in your care Any changes since last visit?  no   Family History  Problem Relation Age of Onset  . Cancer Brother     Colon Cancer  . Heart disease Brother   . Heart disease Brother   . Asthma Mother    History   Social History  . Marital Status: Married    Spouse Name: N/A    Number of Children: N/A   . Years of Education: N/A   Occupational History  . Retired     Nature conservation officer   Social History Main Topics  . Smoking status: Former Smoker -- 0.50 packs/day for 15 years    Types: Cigarettes    Quit date: 06/12/1988  . Smokeless tobacco: Never Used  . Alcohol Use: No  . Drug Use: No  . Sexually Active: None   Other Topics Concern  . None   Social History Narrative   Diet is "good"   Exercise is limited by medical problems   Past Surgical History  Procedure Laterality Date  . Lithotripsy  2007   Past Medical History  Diagnosis Date  . GERD 07/06/2010  . Edema 10/28/2007  . DIABETES MELLITUS, TYPE II 05/04/2007  . HYPERLIPIDEMIA 07/08/2008  . ALLERGIC RHINITIS 07/08/2008  . GLAUCOMA 07/08/2008  . CEREBROVASCULAR ACCIDENT, HX OF 07/08/2008  . OBSTRUCTIVE SLEEP APNEA 03/06/2008  . DIABETIC ULCER, LEFT LEG 08/23/2009  . 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 HYPERTENSION 10/28/2007  . VENOUS INSUFFICIENCY 03/08/2009  . FATTY LIVER DISEASE 05/19/2008  . BENIGN PROSTATIC HYPERTROPHY 07/08/2008  . 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 NEPHROLITHIASIS, HX OF 07/08/2008  . Morbid obesity   . DM nephropathy/sclerosis   . 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 Lumbar spondylosis    BP 125/44  Pulse 86  Resp  16  Ht 5\' 6"  (1.676 m)  Wt 357 lb (161.934 kg)  BMI 57.65 kg/m2  SpO2 91%     Review of Systems  Musculoskeletal: Positive for gait problem.  Neurological: Positive for dizziness and numbness.  All other systems reviewed and are negative.       Objective:   Physical Exam The patient is a morbidly obese gentleman  He is oriented x3. Speech is clear. Affect is bright. He is  alert, cooperative, and pleasant. Follows commands without difficulty.  Answers my questions appropriately.  NEUROLOGIC: Cranial nerves, coordination are intact. His reflexes are  diminished in the lower extremities. He has good strength in  both lower extremities at  hip flexors, knee extensors, dorsiflexors and  plantar flexors.  Sensation is intact in the lower extremities. He is  noted to have chronic swelling in lower extremities.  Right Knee  He has painful joint line over his right knee.(medial and lateral)Motion is preserved.  Right knee flexion contracture 5-10 degrees  Symmetric normal motor tone is noted throughout. Normal muscle bulk. Muscle testing reveals 5/5 muscle strength of the upper extremity, and 5/5 of the lower extremity.  DTR in the upper and lower extremity are present and symmetric 2+. No clonus is noted.  Patient is in a wheel chair.        Assessment & Plan:  1. End-stage right knee osteoarthritis. Prescribed Voltaren gel, currently using lidoderm as well as knee sleeve, walker  2. Morbid obesity.  3. Generally deconditioned.  4. Chronic low back pain/spondylosis.  5. Chronic venous insufficiency/lymphedema.  6. SOB, O2 dependent  Continue following up with the wound care center for his open wound on the left lower leg. Advised patient to do exercises at home, in a sitting or recumbent position, which I showed him today. Advised patient to re start aquatic exercises with his son , after his son is cleared to work out in the water again, after his knee surgery. Continue with his calf pumps.  Filled his medication today, to be filled when due.  Follow up in 1 month.

## 2012-10-04 ENCOUNTER — Encounter: Payer: Self-pay | Admitting: Endocrinology

## 2012-10-04 ENCOUNTER — Ambulatory Visit (INDEPENDENT_AMBULATORY_CARE_PROVIDER_SITE_OTHER): Payer: Medicare Other | Admitting: Endocrinology

## 2012-10-04 VITALS — BP 134/78 | HR 78 | Wt 357.0 lb

## 2012-10-04 DIAGNOSIS — E291 Testicular hypofunction: Secondary | ICD-10-CM

## 2012-10-04 DIAGNOSIS — Z79899 Other long term (current) drug therapy: Secondary | ICD-10-CM

## 2012-10-04 DIAGNOSIS — R7989 Other specified abnormal findings of blood chemistry: Secondary | ICD-10-CM

## 2012-10-04 DIAGNOSIS — E119 Type 2 diabetes mellitus without complications: Secondary | ICD-10-CM

## 2012-10-04 DIAGNOSIS — E1065 Type 1 diabetes mellitus with hyperglycemia: Secondary | ICD-10-CM

## 2012-10-04 DIAGNOSIS — E1049 Type 1 diabetes mellitus with other diabetic neurological complication: Secondary | ICD-10-CM

## 2012-10-04 DIAGNOSIS — I10 Essential (primary) hypertension: Secondary | ICD-10-CM

## 2012-10-04 DIAGNOSIS — E785 Hyperlipidemia, unspecified: Secondary | ICD-10-CM

## 2012-10-04 DIAGNOSIS — K7689 Other specified diseases of liver: Secondary | ICD-10-CM

## 2012-10-04 DIAGNOSIS — N400 Enlarged prostate without lower urinary tract symptoms: Secondary | ICD-10-CM

## 2012-10-04 LAB — HEPATIC FUNCTION PANEL
AST: 26 U/L (ref 0–37)
Alkaline Phosphatase: 81 U/L (ref 39–117)
Bilirubin, Direct: 0.1 mg/dL (ref 0.0–0.3)
Total Bilirubin: 0.4 mg/dL (ref 0.3–1.2)

## 2012-10-04 LAB — CBC WITH DIFFERENTIAL/PLATELET
Basophils Relative: 0.4 % (ref 0.0–3.0)
Eosinophils Absolute: 0.5 10*3/uL (ref 0.0–0.7)
Hemoglobin: 13.6 g/dL (ref 13.0–17.0)
Lymphocytes Relative: 8.1 % — ABNORMAL LOW (ref 12.0–46.0)
MCHC: 32.9 g/dL (ref 30.0–36.0)
Monocytes Relative: 7.1 % (ref 3.0–12.0)
Neutro Abs: 9.8 10*3/uL — ABNORMAL HIGH (ref 1.4–7.7)
Neutrophils Relative %: 80.1 % — ABNORMAL HIGH (ref 43.0–77.0)
RBC: 4.4 Mil/uL (ref 4.22–5.81)
WBC: 12.3 10*3/uL — ABNORMAL HIGH (ref 4.5–10.5)

## 2012-10-04 LAB — URIC ACID: Uric Acid, Serum: 4.4 mg/dL (ref 4.0–7.8)

## 2012-10-04 LAB — URINALYSIS, ROUTINE W REFLEX MICROSCOPIC
Ketones, ur: NEGATIVE
Leukocytes, UA: NEGATIVE
Specific Gravity, Urine: 1.015 (ref 1.000–1.030)
Urine Glucose: NEGATIVE
Urobilinogen, UA: 0.2 (ref 0.0–1.0)
pH: 6 (ref 5.0–8.0)

## 2012-10-04 LAB — HEMOGLOBIN A1C: Hgb A1c MFr Bld: 7 % — ABNORMAL HIGH (ref 4.6–6.5)

## 2012-10-04 LAB — BASIC METABOLIC PANEL
Calcium: 9.1 mg/dL (ref 8.4–10.5)
GFR: 82.87 mL/min (ref >60.00)
Potassium: 3.5 mEq/L (ref 3.5–5.1)
Sodium: 139 mEq/L (ref 135–145)

## 2012-10-04 LAB — MICROALBUMIN / CREATININE URINE RATIO
Microalb Creat Ratio: 1.5 mg/g (ref 0.0–30.0)
Microalb, Ur: 0.9 mg/dL (ref 0.0–1.9)

## 2012-10-04 LAB — LDL CHOLESTEROL, DIRECT: Direct LDL: 75.1 mg/dL

## 2012-10-04 LAB — LIPID PANEL
Cholesterol: 134 mg/dL (ref 0–200)
HDL: 30 mg/dL — ABNORMAL LOW (ref >39.00)

## 2012-10-04 MED ORDER — INSULIN ASPART 100 UNIT/ML ~~LOC~~ SOLN: SUBCUTANEOUS | Status: DC

## 2012-10-04 NOTE — Patient Instructions (Addendum)
Here is a prescription for novolog, to see if it is cheaper than humalog.  Call if not. blood tests are being requested for you today.  We'll contact you with results. please consider these measures for your health:  minimize alcohol.  do not use tobacco products.  have a colonoscopy at least every 10 years from age 68.  keep firearms safely stored.  always use seat belts.  have working smoke alarms in your home.  see an eye doctor and dentist regularly.  never drive under the influence of alcohol or drugs (including prescription drugs).  those with fair skin should take precautions against the sun. it is critically important to prevent falling down (keep floor areas well-lit, dry, and free of loose objects.  If you have a cane, walker, or wheelchair, you should use it, even for short trips around the house.  Also, try not to rush)

## 2012-10-04 NOTE — Progress Notes (Signed)
Subjective:    Patient ID: Blake Summers, male    DOB: 10/29/1945, 67 y.o.   MRN: [id number]  HPI here for regular wellness examination.  He's feeling pretty well in general, and says chronic med probs are stable, except as noted below. Past Medical History  Diagnosis Date  . GERD 07/06/2010  . Edema 10/28/2007  . DIABETES MELLITUS, TYPE II 05/04/2007  . HYPERLIPIDEMIA 07/08/2008  . ALLERGIC RHINITIS 07/08/2008  . GLAUCOMA 07/08/2008  . CEREBROVASCULAR ACCIDENT, HX OF 07/08/2008  . OBSTRUCTIVE SLEEP APNEA 03/06/2008  . DIABETIC ULCER, LEFT LEG 08/23/2009  . 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 HYPERTENSION 10/28/2007  . VENOUS INSUFFICIENCY 03/08/2009  . FATTY LIVER DISEASE 05/19/2008  . BENIGN PROSTATIC HYPERTROPHY 07/08/2008  . 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 NEPHROLITHIASIS, HX OF 07/08/2008  . Morbid obesity   . DM nephropathy/sclerosis   . 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 Lumbar spondylosis     Past Surgical History  Procedure Laterality Date  . Lithotripsy  2007    History   Social History  . Marital Status: Married    Spouse Name: N/A    Number of Children: N/A  . Years of Education: N/A   Occupational History  . Retired     Nature conservation officer   Social History Main Topics  . Smoking status: Former Smoker -- 0.50 packs/day for 15 years    Types: Cigarettes    Quit date: 06/12/1988  . Smokeless tobacco: Never Used  . Alcohol Use: No  . Drug Use: No  . Sexually Active: Not on file   Other Topics Concern  . Not on file   Social History Narrative   Diet is "good"   Exercise is limited by medical problems    Current Outpatient Prescriptions on File Prior to Visit  Medication Sig Dispense Refill  . allopurinol (ZYLOPRIM) 300 MG tablet Take 1 tablet (300 mg total) by mouth 1 day or 1 dose.  30 tablet  5  . brimonidine (ALPHAGAN) 0.15 % ophthalmic solution as directed.      . clobetasol ointment (TEMOVATE) 0.05 % Apply topically  3 (three) times daily.  60 g  3  . DEXILANT 60 MG capsule TAKE 1 CAPSULE BY MOUTH DAILY  30 capsule  6  . diltiazem (CARDIZEM CD) 180 MG 24 hr capsule TAKE 2 CAPSULES BY MOUTH DAILY  60 capsule  3  . dipyridamole-aspirin (AGGRENOX) 200-25 MG per 12 hr capsule TAKE 1 CAPSULE BY MOUTH 2 TIMES         DAILY  60 capsule  3  . docusate sodium (STOOL SOFTENER) 100 MG capsule Take 1 capsule (100 mg total) by mouth 2 (two) times daily.  60 capsule  5  . dorzolamide-timolol (COSOPT) 22.3-6.8 MG/ML ophthalmic solution as directed.      . furosemide (LASIX) 80 MG tablet Take 1 tablet (80 mg total) by mouth daily.  30 tablet  5  . glucose blood (ONE TOUCH TEST STRIPS) test strip Check blood sugar qid       . insulin NPH (HUMULIN N,NOVOLIN N) 100 UNIT/ML injection Inject 80 Units into the skin at bedtime.      . Insulin Pen Needle (NOVOFINE) 30G X 8 MM MISC USE 4 TIMES A DAY.  200 each  6  . ketoconazole (NIZORAL) 2 % shampoo as directed.      . latanoprost (XALATAN) 0.005 % ophthalmic solution Place 1 drop into both eyes at bedtime.  2.5 mL  1  . lidocaine (LIDODERM) 5 % Place 1-3 patches onto the skin daily. Remove & Discard patch within 12 hours or as directed by MD      . loratadine-pseudoephedrine (CLARITIN-D 12 HOUR) 5-120 MG per tablet Take 1 tablet by mouth 2 (two) times daily.  60 tablet  5  . losartan-hydrochlorothiazide (HYZAAR) 100-25 MG per tablet Take 1 tablet by mouth daily.  30 tablet  5  . lovastatin (MEVACOR) 40 MG tablet Take 1 tablet (40 mg total) by mouth at bedtime.  60 tablet  3  . metFORMIN (GLUCOPHAGE-XR) 500 MG 24 hr tablet Take 1 tablet (500 mg total) by mouth 2 (two) times daily.  60 tablet  3  . methocarbamol (ROBAXIN) 500 MG tablet TAKE 1 TABLET BY MOUTH EVERY NIGHT AT   BEDTIME AS NEEDED FOR CRAMPS  30 tablet  5  . morphine (MS CONTIN) 15 MG 12 hr tablet Take 1 tablet (15 mg total) by mouth 2 (two) times daily.  60 tablet  0  . naproxen sodium (ANAPROX) 220 MG tablet 2 tablets by  mouth once daily as needed for pain       . oxyCODONE-acetaminophen (PERCOCET) 7.5-500 MG per tablet Take 1 tablet by mouth every 8 (eight) hours as needed for pain. 1 tablet by mouth three times a day as needed  90 tablet  0  . polyethylene glycol powder (GLYCOLAX/MIRALAX) powder DISSOLVE ONE CAPFUL ( 17GM) IN 8 TO 10 OUNCES OF WATER DAILY  527 g  6  . potassium chloride SA (KLOR-CON M20) 20 MEQ tablet Take 1 tablet (20 mEq total) by mouth 2 (two) times daily.  60 tablet  11  . PROVENTIL HFA 108 (90 BASE) MCG/ACT inhaler ONE PUFF EVERY 4 HOURS AS NEEDED  6.7 g  5  . ranitidine (ZANTAC) 150 MG tablet 2  At bedtime  60 tablet  5  . testosterone (ANDROGEL) 50 MG/5GM GEL 3 tubes daily  90 Tube  5  . topiramate (TOPAMAX) 50 MG tablet Take 1 tablet (50 mg total) by mouth 2 (two) times daily.  60 tablet  3  . VOLTAREN 1 % GEL APPLY TOPICALLY 4 TIMES DAILY AS PHYSICIAN INSTRUCTED  200 g  3  . zolpidem (AMBIEN) 10 MG tablet Take 10 mg by mouth at bedtime as needed.         No current facility-administered medications on file prior to visit.    Allergies  Allergen Reactions  . Neosporin (Neomycin-Polymyxin-Gramicidin)     Family History  Problem Relation Age of Onset  . Cancer Brother     Colon Cancer  . Heart disease Brother   . Heart disease Brother   . Asthma Mother     BP 134/78  Pulse 78  Wt 357 lb (161.934 kg)  BMI 57.65 kg/m2  SpO2 92%   Review of Systems  Constitutional: Negative for fever and unexpected weight change.  HENT: Negative for hearing loss.   Eyes: Negative for visual disturbance.  Respiratory:       No change in chronic sob  Cardiovascular: Negative for chest pain.  Gastrointestinal: Negative for anal bleeding.  Endocrine: Negative for cold intolerance.  Genitourinary: Negative for hematuria.  Musculoskeletal: Positive for back pain.  Skin: Negative for rash.  Allergic/Immunologic: Negative for environmental allergies.  Neurological: Negative for syncope  and headaches.  Hematological: Does not bruise/bleed easily.  Psychiatric/Behavioral: Negative for dysphoric mood.       Objective:   Physical Exam  VS: see vs page GEN: no distress.  Morbid obesity.  In wheelchair. HEAD: head: no deformity eyes: no periorbital swelling, no proptosis external nose and ears are normal mouth: no lesion seen NECK: supple, thyroid is not enlarged CHEST WALL: no deformity LUNGS: clear to auscultation BREASTS:  No gynecomastia CV: reg rate and rhythm, no murmur ABD: abdomen is soft, nontender.  no hepatosplenomegaly.  not distended.  no hernia GENITALIA/RECTAL/PROSTATE:  declined MUSCULOSKELETAL: muscle bulk and strength are grossly normal.  no obvious joint swelling.  gait is normal and steady PULSES: no carotid bruit NEURO:  cn 2-12 grossly intact.   readily moves all 4's.   SKIN:  Normal texture and temperature.  No rash or suspicious lesion is visible.   NODES:  None palpable at the neck PSYCH: alert, oriented x3.  Does not appear anxious nor depressed.     Assessment & Plan:  Wellness visit today, with problems stable, except as noted.  we discussed code status.  pt requests full code.

## 2012-10-23 ENCOUNTER — Encounter: Payer: Self-pay | Admitting: Physical Medicine and Rehabilitation

## 2012-10-23 ENCOUNTER — Encounter
Payer: Medicare Other | Attending: Physical Medicine and Rehabilitation | Admitting: Physical Medicine and Rehabilitation

## 2012-10-23 VITALS — BP 160/51 | HR 92 | Resp 16 | Ht 67.0 in | Wt 357.0 lb

## 2012-10-23 DIAGNOSIS — M1710 Unilateral primary osteoarthritis, unspecified knee: Secondary | ICD-10-CM | POA: Insufficient documentation

## 2012-10-23 DIAGNOSIS — R0602 Shortness of breath: Secondary | ICD-10-CM | POA: Insufficient documentation

## 2012-10-23 DIAGNOSIS — M47817 Spondylosis without myelopathy or radiculopathy, lumbosacral region: Secondary | ICD-10-CM | POA: Insufficient documentation

## 2012-10-23 DIAGNOSIS — M1711 Unilateral primary osteoarthritis, right knee: Secondary | ICD-10-CM

## 2012-10-23 DIAGNOSIS — R5381 Other malaise: Secondary | ICD-10-CM | POA: Insufficient documentation

## 2012-10-23 DIAGNOSIS — I872 Venous insufficiency (chronic) (peripheral): Secondary | ICD-10-CM | POA: Insufficient documentation

## 2012-10-23 DIAGNOSIS — Z9981 Dependence on supplemental oxygen: Secondary | ICD-10-CM | POA: Insufficient documentation

## 2012-10-23 DIAGNOSIS — I890 Lymphedema, not elsewhere classified: Secondary | ICD-10-CM | POA: Insufficient documentation

## 2012-10-23 DIAGNOSIS — G8929 Other chronic pain: Secondary | ICD-10-CM | POA: Insufficient documentation

## 2012-10-23 MED ORDER — OXYCODONE-ACETAMINOPHEN 7.5-500 MG PO TABS: 1.0000 | ORAL_TABLET | Freq: Three times a day (TID) | ORAL | Status: DC | PRN

## 2012-10-23 MED ORDER — MORPHINE SULFATE ER 15 MG PO TBCR: 15.0000 mg | EXTENDED_RELEASE_TABLET | Freq: Two times a day (BID) | ORAL | Status: DC

## 2012-10-23 NOTE — Progress Notes (Signed)
Subjective:    Patient ID: Blake Summers, male    DOB: 1945/12/07, 67 y.o.   MRN: [id number]  HPI The patient complains about chronic right knee pain. The patient denies any radiation. The patient also complains about an open wound on his left lower leg, which is treated by the wound care center. He states the wound is healing well.  The knee problem has been stable, his open wound is healing. The patient had to interrupt his physical therapy in the water because of the open wound. The patient states, that he had difficulties breathing, he saw his pulmonologist, and is now on O2. He reports that a HH-nurse was visiting him and educated him about his diabetis, medication and diet. He is now eating more healthy. He also reports, that the calf pumps have really improved the edema on both of his legs.  Pain Inventory Average Pain 8 Pain Right Now 8 My pain is sharp, stabbing and aching  In the last 24 hours, has pain interfered with the following? General activity 8 Relation with others 9 Enjoyment of life 9 What TIME of day is your pain at its worst? daytime Sleep (in general) Poor  Pain is worse with: walking and standing Pain improves with: rest, heat/ice and injections Relief from Meds: 7  Mobility walk with assistance use a cane use a walker ability to climb steps?  yes do you drive?  yes use a wheelchair needs help with transfers  Function retired I need assistance with the following:  dressing, meal prep, household duties and shopping  Neuro/Psych numbness tingling trouble walking dizziness  Prior Studies Any changes since last visit?  no  Physicians involved in your care Any changes since last visit?  no   Family History  Problem Relation Age of Onset  . Cancer Brother     Colon Cancer  . Heart disease Brother   . Heart disease Brother   . Asthma Mother    History   Social History  . Marital Status: Married    Spouse Name: N/A    Number of  Children: N/A  . Years of Education: N/A   Occupational History  . Retired     Nature conservation officer   Social History Main Topics  . Smoking status: Former Smoker -- 0.50 packs/day for 15 years    Types: Cigarettes    Quit date: 06/12/1988  . Smokeless tobacco: Never Used  . Alcohol Use: No  . Drug Use: No  . Sexually Active: None   Other Topics Concern  . None   Social History Narrative   Diet is "good"   Exercise is limited by medical problems   Past Surgical History  Procedure Laterality Date  . Lithotripsy  2007   Past Medical History  Diagnosis Date  . GERD 07/06/2010  . Edema 10/28/2007  . DIABETES MELLITUS, TYPE II 05/04/2007  . HYPERLIPIDEMIA 07/08/2008  . ALLERGIC RHINITIS 07/08/2008  . GLAUCOMA 07/08/2008  . CEREBROVASCULAR ACCIDENT, HX OF 07/08/2008  . OBSTRUCTIVE SLEEP APNEA 03/06/2008  . DIABETIC ULCER, LEFT LEG 08/23/2009  . 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 HYPERTENSION 10/28/2007  . VENOUS INSUFFICIENCY 03/08/2009  . FATTY LIVER DISEASE 05/19/2008  . BENIGN PROSTATIC HYPERTROPHY 07/08/2008  . 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 NEPHROLITHIASIS, HX OF 07/08/2008  . Morbid obesity   . DM nephropathy/sclerosis   . 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 Lumbar spondylosis    BP 160/51  Pulse 92  Resp 16  Ht 5\' 7"  (1.702 m)  Wt 357 lb (161.934 kg)  BMI 55.9 kg/m2  SpO2 87%     Review of Systems  Musculoskeletal: Positive for back pain and gait problem.  Neurological: Positive for dizziness and numbness.  All other systems reviewed and are negative.       Objective:   Physical Exam The patient is a morbidly obese gentleman  He is oriented x3. Speech is clear. Affect is bright. He is  alert, cooperative, and pleasant. Follows commands without difficulty.  Answers my questions appropriately.  NEUROLOGIC: Cranial nerves, coordination are intact. His reflexes are  diminished in the lower extremities. He has good strength in   both lower extremities at hip flexors, knee extensors, dorsiflexors and  plantar flexors.  Sensation is intact in the lower extremities. He is  noted to have chronic swelling in lower extremities.  Right Knee  He has painful joint line over his right knee.(medial and lateral)Motion is preserved.  Right knee flexion contracture 5-10 degrees  Symmetric normal motor tone is noted throughout. Normal muscle bulk. Muscle testing reveals 5/5 muscle strength of the upper extremity, and 5/5 of the lower extremity.  DTR in the upper and lower extremity are present and symmetric 2+. No clonus is noted.  Patient is in a wheel chair.        Assessment & Plan:  1. End-stage right knee osteoarthritis. Prescribed Voltaren gel, currently using lidoderm as well as knee sleeve, walker  2. Morbid obesity.  3. Generally deconditioned.  4. Chronic low back pain/spondylosis.  5. Chronic venous insufficiency/lymphedema.  6. SOB, O2 dependent  Continue following up with the wound care center for his open wound on the left lower leg. Advised patient to do exercises at home, in a sitting or recumbent position, which I showed him today. Advised patient to re start aquatic exercises with his son , after his wound has healed completely. Continue with his calf pumps.  Filled his medication today, to be filled when due.  Follow up in 1 month.

## 2012-10-23 NOTE — Patient Instructions (Signed)
Follow up with the wound care center, if the wound does not heel well, also go to the wound center if there are any signs of infections, like increased temperature, redness or drainage from the wound.

## 2012-10-29 ENCOUNTER — Other Ambulatory Visit: Payer: Self-pay | Admitting: Physical Medicine and Rehabilitation

## 2012-10-29 ENCOUNTER — Other Ambulatory Visit: Payer: Self-pay | Admitting: *Deleted

## 2012-10-29 MED ORDER — INSULIN NPH (HUMAN) (ISOPHANE) 100 UNIT/ML ~~LOC~~ SUSP: 80.0000 [IU] | Freq: Every day | SUBCUTANEOUS | Status: DC

## 2012-10-29 MED ORDER — LATANOPROST 0.005 % OP SOLN: 1.0000 [drp] | Freq: Every day | OPHTHALMIC | Status: AC

## 2012-10-29 MED ORDER — ALLOPURINOL 300 MG PO TABS: 300.0000 mg | ORAL_TABLET | ORAL | Status: DC

## 2012-10-29 MED ORDER — CLOBETASOL PROPIONATE 0.05 % EX OINT: TOPICAL_OINTMENT | Freq: Three times a day (TID) | CUTANEOUS | Status: DC

## 2012-10-29 MED ORDER — FUROSEMIDE 80 MG PO TABS: 80.0000 mg | ORAL_TABLET | Freq: Every day | ORAL | Status: DC

## 2012-11-22 ENCOUNTER — Encounter: Payer: Self-pay | Admitting: Physical Medicine and Rehabilitation

## 2012-11-22 ENCOUNTER — Encounter
Payer: Medicare Other | Attending: Physical Medicine and Rehabilitation | Admitting: Physical Medicine and Rehabilitation

## 2012-11-22 VITALS — BP 145/57 | HR 94 | Resp 16 | Ht 66.0 in | Wt 360.0 lb

## 2012-11-22 DIAGNOSIS — M1711 Unilateral primary osteoarthritis, right knee: Secondary | ICD-10-CM

## 2012-11-22 DIAGNOSIS — M6250 Muscle wasting and atrophy, not elsewhere classified, unspecified site: Secondary | ICD-10-CM | POA: Insufficient documentation

## 2012-11-22 DIAGNOSIS — X58XXXA Exposure to other specified factors, initial encounter: Secondary | ICD-10-CM | POA: Insufficient documentation

## 2012-11-22 DIAGNOSIS — M47817 Spondylosis without myelopathy or radiculopathy, lumbosacral region: Secondary | ICD-10-CM | POA: Insufficient documentation

## 2012-11-22 DIAGNOSIS — S8990XA Unspecified injury of unspecified lower leg, initial encounter: Secondary | ICD-10-CM | POA: Insufficient documentation

## 2012-11-22 DIAGNOSIS — Z9981 Dependence on supplemental oxygen: Secondary | ICD-10-CM | POA: Insufficient documentation

## 2012-11-22 DIAGNOSIS — R0602 Shortness of breath: Secondary | ICD-10-CM | POA: Insufficient documentation

## 2012-11-22 DIAGNOSIS — M1710 Unilateral primary osteoarthritis, unspecified knee: Secondary | ICD-10-CM | POA: Insufficient documentation

## 2012-11-22 DIAGNOSIS — I890 Lymphedema, not elsewhere classified: Secondary | ICD-10-CM | POA: Insufficient documentation

## 2012-11-22 DIAGNOSIS — S99929A Unspecified injury of unspecified foot, initial encounter: Secondary | ICD-10-CM | POA: Insufficient documentation

## 2012-11-22 DIAGNOSIS — E119 Type 2 diabetes mellitus without complications: Secondary | ICD-10-CM | POA: Insufficient documentation

## 2012-11-22 DIAGNOSIS — G8929 Other chronic pain: Secondary | ICD-10-CM | POA: Insufficient documentation

## 2012-11-22 DIAGNOSIS — I872 Venous insufficiency (chronic) (peripheral): Secondary | ICD-10-CM | POA: Insufficient documentation

## 2012-11-22 MED ORDER — OXYCODONE-ACETAMINOPHEN 7.5-500 MG PO TABS: 1.0000 | ORAL_TABLET | Freq: Three times a day (TID) | ORAL | Status: DC | PRN

## 2012-11-22 MED ORDER — MORPHINE SULFATE ER 15 MG PO TBCR: 15.0000 mg | EXTENDED_RELEASE_TABLET | Freq: Two times a day (BID) | ORAL | Status: DC

## 2012-11-22 NOTE — Progress Notes (Signed)
Subjective:    Patient ID: Blake Summers, male    DOB: Jan 23, 1946, 67 y.o.   MRN: [id number]  HPI The patient complains about chronic right knee pain. The patient denies any radiation. The patient also complains about an open wound on his left lower leg, which is treated by the wound care center. He states the wound is healing well.  The knee problem has been stable, his open wound is healing. The patient had to interrupt his physical therapy in the water because of the open wound. The patient states, that he had difficulties breathing, he saw his pulmonologist, and is now on O2. He reports that a HH-nurse was visiting him and educated him about his diabetis, medication and diet. He is now eating more healthy. He also reports, that the calf pumps have really improved the edema on both of his legs.  Pain Inventory Average Pain 8 Pain Right Now 8 My pain is burning, tingling and aching  In the last 24 hours, has pain interfered with the following? General activity 8 Relation with others 9 Enjoyment of life 8 What TIME of day is your pain at its worst? daytime Sleep (in general) Poor  Pain is worse with: walking and standing Pain improves with: rest, heat/ice, medication and injections Relief from Meds: 7  Mobility walk with assistance use a cane use a walker ability to climb steps?  yes do you drive?  yes use a wheelchair needs help with transfers  Function retired I need assistance with the following:  dressing, meal prep, household duties and shopping  Neuro/Psych weakness numbness trouble walking dizziness  Prior Studies Any changes since last visit?  no  Physicians involved in your care Any changes since last visit?  no   Family History  Problem Relation Age of Onset  . Cancer Brother     Colon Cancer  . Heart disease Brother   . Heart disease Brother   . Asthma Mother    History   Social History  . Marital Status: Married    Spouse Name: N/A   Number of Children: N/A  . Years of Education: N/A   Occupational History  . Retired     Nature conservation officer   Social History Main Topics  . Smoking status: Former Smoker -- 0.50 packs/day for 15 years    Types: Cigarettes    Quit date: 06/12/1988  . Smokeless tobacco: Never Used  . Alcohol Use: No  . Drug Use: No  . Sexually Active: None   Other Topics Concern  . None   Social History Narrative   Diet is "good"   Exercise is limited by medical problems   Past Surgical History  Procedure Laterality Date  . Lithotripsy  2007   Past Medical History  Diagnosis Date  . GERD 07/06/2010  . Edema 10/28/2007  . DIABETES MELLITUS, TYPE II 05/04/2007  . HYPERLIPIDEMIA 07/08/2008  . ALLERGIC RHINITIS 07/08/2008  . GLAUCOMA 07/08/2008  . CEREBROVASCULAR ACCIDENT, HX OF 07/08/2008  . OBSTRUCTIVE SLEEP APNEA 03/06/2008  . DIABETIC ULCER, LEFT LEG 08/23/2009  . 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 HYPERTENSION 10/28/2007  . VENOUS INSUFFICIENCY 03/08/2009  . FATTY LIVER DISEASE 05/19/2008  . BENIGN PROSTATIC HYPERTROPHY 07/08/2008  . 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 NEPHROLITHIASIS, HX OF 07/08/2008  . Morbid obesity   . DM nephropathy/sclerosis   . 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 Lumbar spondylosis    BP 145/57  Pulse 94  Resp  16  Ht 5\' 6"  (1.676 m)  Wt 360 lb (163.295 kg)  BMI 58.13 kg/m2  SpO2 84%     Review of Systems  Musculoskeletal: Positive for gait problem.  Neurological: Positive for dizziness, weakness and numbness.       Tingling  All other systems reviewed and are negative.       Objective:   Physical Exam The patient is a morbidly obese gentleman  He is oriented x3. Speech is clear. Affect is bright. He is  alert, cooperative, and pleasant. Follows commands without difficulty.  Answers my questions appropriately.  NEUROLOGIC: Cranial nerves, coordination are intact. His reflexes are  diminished in the lower extremities. He  has good strength in  both lower extremities at hip flexors, knee extensors, dorsiflexors and  plantar flexors.  Sensation is intact in the lower extremities. He is  noted to have chronic swelling in lower extremities.  Right Knee  He has painful joint line over his right knee.(medial and lateral)Motion is preserved.  Right knee flexion contracture 5-10 degrees  Symmetric normal motor tone is noted throughout. Normal muscle bulk. Muscle testing reveals 5/5 muscle strength of the upper extremity, and 5/5 of the lower extremity.  DTR in the upper and lower extremity are present and symmetric 2+. No clonus is noted.  Patient is in a wheel chair.        Assessment & Plan:  1. End-stage right knee osteoarthritis. Prescribed Voltaren gel, currently using lidoderm as well as knee sleeve, walker  2. Morbid obesity.  3. Generally deconditioned.  4. Chronic low back pain/spondylosis.  5. Chronic venous insufficiency/lymphedema.  6. SOB, O2 dependent  Continue following up with the wound care center for his open wound on the left lower leg. Advised patient to do exercises at home, in a sitting or recumbent position, which I showed him today. Advised patient to re start aquatic exercises with his son , after his wound has healed completely. Continue with his calf pumps.  Filled his medication today, to be filled when due.  Follow up in 1 month.

## 2012-11-22 NOTE — Patient Instructions (Signed)
Stay as active as possible

## 2012-12-02 ENCOUNTER — Other Ambulatory Visit: Payer: Self-pay | Admitting: *Deleted

## 2012-12-02 MED ORDER — METFORMIN HCL ER 500 MG PO TB24: 500.0000 mg | ORAL_TABLET | Freq: Two times a day (BID) | ORAL | Status: DC

## 2012-12-02 MED ORDER — ASPIRIN-DIPYRIDAMOLE ER 25-200 MG PO CP12: ORAL_CAPSULE | ORAL | Status: DC

## 2012-12-02 MED ORDER — DILTIAZEM HCL ER COATED BEADS 180 MG PO CP24: ORAL_CAPSULE | ORAL | Status: DC

## 2012-12-02 MED ORDER — LORATADINE-PSEUDOEPHEDRINE ER 5-120 MG PO TB12: 1.0000 | ORAL_TABLET | Freq: Two times a day (BID) | ORAL | Status: DC

## 2012-12-24 ENCOUNTER — Encounter
Payer: Medicare Other | Attending: Physical Medicine and Rehabilitation | Admitting: Physical Medicine and Rehabilitation

## 2012-12-24 ENCOUNTER — Encounter: Payer: Self-pay | Admitting: Physical Medicine and Rehabilitation

## 2012-12-24 DIAGNOSIS — M545 Low back pain, unspecified: Secondary | ICD-10-CM | POA: Insufficient documentation

## 2012-12-24 DIAGNOSIS — R0602 Shortness of breath: Secondary | ICD-10-CM | POA: Insufficient documentation

## 2012-12-24 DIAGNOSIS — E119 Type 2 diabetes mellitus without complications: Secondary | ICD-10-CM | POA: Insufficient documentation

## 2012-12-24 DIAGNOSIS — M47817 Spondylosis without myelopathy or radiculopathy, lumbosacral region: Secondary | ICD-10-CM | POA: Insufficient documentation

## 2012-12-24 DIAGNOSIS — Z9981 Dependence on supplemental oxygen: Secondary | ICD-10-CM | POA: Insufficient documentation

## 2012-12-24 DIAGNOSIS — K219 Gastro-esophageal reflux disease without esophagitis: Secondary | ICD-10-CM | POA: Insufficient documentation

## 2012-12-24 DIAGNOSIS — E785 Hyperlipidemia, unspecified: Secondary | ICD-10-CM | POA: Insufficient documentation

## 2012-12-24 DIAGNOSIS — I872 Venous insufficiency (chronic) (peripheral): Secondary | ICD-10-CM | POA: Insufficient documentation

## 2012-12-24 DIAGNOSIS — G4733 Obstructive sleep apnea (adult) (pediatric): Secondary | ICD-10-CM | POA: Insufficient documentation

## 2012-12-24 DIAGNOSIS — I10 Essential (primary) hypertension: Secondary | ICD-10-CM | POA: Insufficient documentation

## 2012-12-24 DIAGNOSIS — R5381 Other malaise: Secondary | ICD-10-CM | POA: Insufficient documentation

## 2012-12-24 DIAGNOSIS — M1711 Unilateral primary osteoarthritis, right knee: Secondary | ICD-10-CM

## 2012-12-24 DIAGNOSIS — I890 Lymphedema, not elsewhere classified: Secondary | ICD-10-CM | POA: Insufficient documentation

## 2012-12-24 DIAGNOSIS — G8929 Other chronic pain: Secondary | ICD-10-CM | POA: Insufficient documentation

## 2012-12-24 DIAGNOSIS — M25569 Pain in unspecified knee: Secondary | ICD-10-CM | POA: Insufficient documentation

## 2012-12-24 DIAGNOSIS — M1710 Unilateral primary osteoarthritis, unspecified knee: Secondary | ICD-10-CM | POA: Insufficient documentation

## 2012-12-24 DIAGNOSIS — M25561 Pain in right knee: Secondary | ICD-10-CM

## 2012-12-24 MED ORDER — MORPHINE SULFATE ER 15 MG PO TBCR: 15.0000 mg | EXTENDED_RELEASE_TABLET | Freq: Two times a day (BID) | ORAL | Status: DC

## 2012-12-24 MED ORDER — OXYCODONE-ACETAMINOPHEN 7.5-500 MG PO TABS: 1.0000 | ORAL_TABLET | Freq: Three times a day (TID) | ORAL | Status: DC | PRN

## 2012-12-24 NOTE — Progress Notes (Signed)
Subjective:    Patient ID: Blake Summers, male    DOB: 09-Jun-1946, 67 y.o.   MRN: [id number]  HPI The patient complains about chronic right knee pain. The patient denies any radiation. The patient also complains about an open wound on his left lower leg, which is treated by the wound care center. He states the wound is healing well.  The knee problem has been stable, his open wound is healing. The patient had to interrupt his physical therapy in the water because of the open wound. The patient states, that he had difficulties breathing, he saw his pulmonologist, and is now on O2. He reports that a HH-nurse was visiting him and educated him about his diabetis, medication and diet. He is now eating more healthy. He also reports, that his left lower leg is leaking fluid again, he was taking Ax for 4 weeks, and is now following up with the wound care center. He is also still using his calf pumps regularly.  He also complains about LBP, especially after sitting so long in his wheel chair.  Pain Inventory Average Pain 8 Pain Right Now 7 My pain is sharp, stabbing and aching  In the last 24 hours, has pain interfered with the following? General activity 8 Relation with others 9 Enjoyment of life 9 What TIME of day is your pain at its worst? daytime Sleep (in general) Poor  Pain is worse with: walking and standing Pain improves with: rest, heat/ice, medication and injections Relief from Meds: 7  Mobility walk with assistance use a cane use a walker ability to climb steps?  yes do you drive?  yes  Function disabled: date disabled . I need assistance with the following:  dressing, meal prep, household duties and shopping  Neuro/Psych numbness tingling trouble walking dizziness  Prior Studies Any changes since last visit?  no  Physicians involved in your care Any changes since last visit?  no   Family History  Problem Relation Age of Onset  . Cancer Brother     Colon Cancer   . Heart disease Brother   . Heart disease Brother   . Asthma Mother    History   Social History  . Marital Status: Married    Spouse Name: N/A    Number of Children: N/A  . Years of Education: N/A   Occupational History  . Retired     Nature conservation officer   Social History Main Topics  . Smoking status: Former Smoker -- 0.50 packs/day for 15 years    Types: Cigarettes    Quit date: 06/12/1988  . Smokeless tobacco: Never Used  . Alcohol Use: No  . Drug Use: No  . Sexually Active: None   Other Topics Concern  . None   Social History Narrative   Diet is "good"   Exercise is limited by medical problems   Past Surgical History  Procedure Laterality Date  . Lithotripsy  2007   Past Medical History  Diagnosis Date  . GERD 07/06/2010  . Edema 10/28/2007  . DIABETES MELLITUS, TYPE II 05/04/2007  . HYPERLIPIDEMIA 07/08/2008  . ALLERGIC RHINITIS 07/08/2008  . GLAUCOMA 07/08/2008  . CEREBROVASCULAR ACCIDENT, HX OF 07/08/2008  . OBSTRUCTIVE SLEEP APNEA 03/06/2008  . DIABETIC ULCER, LEFT LEG 08/23/2009  . 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 HYPERTENSION 10/28/2007  . VENOUS INSUFFICIENCY 03/08/2009  . FATTY LIVER DISEASE 05/19/2008  . BENIGN PROSTATIC HYPERTROPHY 07/08/2008  . 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 NEPHROLITHIASIS, HX OF 07/08/2008  . Morbid  obesity   . DM nephropathy/sclerosis   . 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 Lumbar spondylosis    There were no vitals taken for this visit.    Review of Systems  Musculoskeletal: Positive for back pain and gait problem.  Neurological: Positive for numbness.       Tingling  All other systems reviewed and are negative.       Objective:   Physical Exam The patient is a morbidly obese gentleman  He is oriented x3. Speech is clear. Affect is bright. He is  alert, cooperative, and pleasant. Follows commands without difficulty.  Answers my questions appropriately.  NEUROLOGIC: Cranial nerves,  coordination are intact. His reflexes are  diminished in the lower extremities. He has good strength in  both lower extremities at hip flexors, knee extensors, dorsiflexors and  plantar flexors.  Sensation is intact in the lower extremities. He is  noted to have chronic swelling in lower extremities.Left lower leg is wrapped . Right Knee  He has painful joint line over his right knee.(medial and lateral)Motion is preserved.  Right knee flexion contracture 5-10 degrees  Symmetric normal motor tone is noted throughout. Normal muscle bulk. Muscle testing reveals 5/5 muscle strength of the upper extremity, and 5/5 of the lower extremity.  DTR in the upper and lower extremity are present and symmetric 2+. No clonus is noted.  Patient is in a wheel chair.        Assessment & Plan:  1. End-stage right knee osteoarthritis. Prescribed Voltaren gel, currently using lidoderm as well as knee sleeve, walker  2. Morbid obesity.  3. Generally deconditioned.  4. Chronic low back pain/spondylosis.  5. Chronic venous insufficiency/lymphedema. Leaking at left lower leg/ wrapped, treated by wound care center 6. SOB, O2 dependent 7. LBP, advised patient to ask his PCP, who prescribes him Robaxin 1 tablet at night, to change the dosage to 3 tablets a day, to help with his muscle spasms/pain in his lower back.  Continue following up with the wound care center for his open wound on the left lower leg. Advised patient to do exercises at home, in a sitting or recumbent position, which I showed him .  Continue with his calf pumps.  Filled his medication today.  Follow up in 1 month.

## 2012-12-24 NOTE — Patient Instructions (Signed)
Follow up with wound care

## 2012-12-25 ENCOUNTER — Encounter: Payer: Self-pay | Admitting: Physical Medicine and Rehabilitation

## 2012-12-25 NOTE — Addendum Note (Signed)
Addended by: Doreene Eland on: 12/25/2012 08:20 AM   Modules accepted: Orders

## 2013-01-02 ENCOUNTER — Other Ambulatory Visit: Payer: Self-pay | Admitting: *Deleted

## 2013-01-02 MED ORDER — ALLOPURINOL 300 MG PO TABS: 300.0000 mg | ORAL_TABLET | ORAL | Status: DC

## 2013-01-02 MED ORDER — LOSARTAN POTASSIUM-HCTZ 100-25 MG PO TABS: 1.0000 | ORAL_TABLET | Freq: Every day | ORAL | Status: DC

## 2013-01-06 ENCOUNTER — Other Ambulatory Visit: Payer: Self-pay | Admitting: *Deleted

## 2013-01-06 MED ORDER — RANITIDINE HCL 150 MG PO TABS: ORAL_TABLET | ORAL | Status: DC

## 2013-01-15 ENCOUNTER — Other Ambulatory Visit: Payer: Self-pay

## 2013-01-23 ENCOUNTER — Encounter: Payer: Self-pay | Admitting: Physical Medicine and Rehabilitation

## 2013-01-23 ENCOUNTER — Encounter
Payer: Medicare Other | Attending: Physical Medicine and Rehabilitation | Admitting: Physical Medicine and Rehabilitation

## 2013-01-23 VITALS — BP 140/58 | HR 97 | Resp 16 | Ht 66.0 in | Wt 360.0 lb

## 2013-01-23 DIAGNOSIS — Z9981 Dependence on supplemental oxygen: Secondary | ICD-10-CM | POA: Insufficient documentation

## 2013-01-23 DIAGNOSIS — M47817 Spondylosis without myelopathy or radiculopathy, lumbosacral region: Secondary | ICD-10-CM | POA: Insufficient documentation

## 2013-01-23 DIAGNOSIS — M1710 Unilateral primary osteoarthritis, unspecified knee: Secondary | ICD-10-CM | POA: Insufficient documentation

## 2013-01-23 DIAGNOSIS — M25561 Pain in right knee: Secondary | ICD-10-CM

## 2013-01-23 DIAGNOSIS — G8929 Other chronic pain: Secondary | ICD-10-CM | POA: Insufficient documentation

## 2013-01-23 DIAGNOSIS — R5381 Other malaise: Secondary | ICD-10-CM | POA: Insufficient documentation

## 2013-01-23 DIAGNOSIS — I872 Venous insufficiency (chronic) (peripheral): Secondary | ICD-10-CM | POA: Insufficient documentation

## 2013-01-23 DIAGNOSIS — M25569 Pain in unspecified knee: Secondary | ICD-10-CM

## 2013-01-23 DIAGNOSIS — R0602 Shortness of breath: Secondary | ICD-10-CM | POA: Insufficient documentation

## 2013-01-23 DIAGNOSIS — M1711 Unilateral primary osteoarthritis, right knee: Secondary | ICD-10-CM

## 2013-01-23 DIAGNOSIS — I890 Lymphedema, not elsewhere classified: Secondary | ICD-10-CM | POA: Insufficient documentation

## 2013-01-23 MED ORDER — LIDOCAINE 5 % EX PTCH: 1.0000 | MEDICATED_PATCH | CUTANEOUS | Status: DC

## 2013-01-23 MED ORDER — MORPHINE SULFATE ER 15 MG PO TBCR: 15.0000 mg | EXTENDED_RELEASE_TABLET | Freq: Two times a day (BID) | ORAL | Status: DC

## 2013-01-23 MED ORDER — OXYCODONE-ACETAMINOPHEN 7.5-500 MG PO TABS: 1.0000 | ORAL_TABLET | Freq: Three times a day (TID) | ORAL | Status: DC | PRN

## 2013-01-23 NOTE — Patient Instructions (Signed)
Follow up with wound care, stay as active as tolerated

## 2013-01-23 NOTE — Progress Notes (Signed)
Subjective:    Patient ID: Blake Summers, male    DOB: 1946/05/09, 67 y.o.   MRN: [id number]  HPI The patient complains about chronic right knee pain. The patient denies any radiation. The patient also complains about an open wound on his left lower leg, which is treated by the wound care center. He states the wound is healing well.  The knee problem has been stable, his open wound is healing. The patient had to interrupt his physical therapy in the water because of the open wound. The patient states, that he had difficulties breathing, he saw his pulmonologist, and is now on O2. He reports that a HH-nurse was visiting him and educated him about his diabetis, medication and diet. He is now eating more healthy. He also reports, that his left lower leg is leaking fluid again, he was taking Ax for 4 weeks, and is now following up with the wound care center. He is also still using his calf pumps regularly.  He also complains about LBP, especially after sitting so long in his wheel chair.  Pain Inventory Average Pain 8 Pain Right Now 8 My pain is sharp, burning, stabbing and aching  In the last 24 hours, has pain interfered with the following? General activity 8 Relation with others 9 Enjoyment of life 8 What TIME of day is your pain at its worst? evening Sleep (in general) Poor  Pain is worse with: walking and standing Pain improves with: rest, heat/ice, medication and injections Relief from Meds: 7  Mobility walk with assistance use a cane use a walker ability to climb steps?  yes do you drive?  yes use a wheelchair needs help with transfers  Function retired I need assistance with the following:  dressing, meal prep, household duties and shopping  Neuro/Psych weakness numbness tingling trouble walking dizziness  Prior Studies Any changes since last visit?  no  Physicians involved in your care Any changes since last visit?  no   Family History  Problem Relation Age  of Onset  . Cancer Brother     Colon Cancer  . Heart disease Brother   . Heart disease Brother   . Asthma Mother    History   Social History  . Marital Status: Married    Spouse Name: N/A    Number of Children: N/A  . Years of Education: N/A   Occupational History  . Retired     Nature conservation officer   Social History Main Topics  . Smoking status: Former Smoker -- 0.50 packs/day for 15 years    Types: Cigarettes    Quit date: 06/12/1988  . Smokeless tobacco: Never Used  . Alcohol Use: No  . Drug Use: No  . Sexual Activity: None   Other Topics Concern  . None   Social History Narrative   Diet is "good"   Exercise is limited by medical problems   Past Surgical History  Procedure Laterality Date  . Lithotripsy  2007   Past Medical History  Diagnosis Date  . GERD 07/06/2010  . Edema 10/28/2007  . DIABETES MELLITUS, TYPE II 05/04/2007  . HYPERLIPIDEMIA 07/08/2008  . ALLERGIC RHINITIS 07/08/2008  . GLAUCOMA 07/08/2008  . CEREBROVASCULAR ACCIDENT, HX OF 07/08/2008  . OBSTRUCTIVE SLEEP APNEA 03/06/2008  . DIABETIC ULCER, LEFT LEG 08/23/2009  . 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 HYPERTENSION 10/28/2007  . VENOUS INSUFFICIENCY 03/08/2009  . FATTY LIVER DISEASE 05/19/2008  . BENIGN PROSTATIC HYPERTROPHY 07/08/2008  . 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 NEPHROLITHIASIS,  HX OF 07/08/2008  . Morbid obesity   . DM nephropathy/sclerosis   . 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 Lumbar spondylosis    BP 140/58  Pulse 97  Resp 16  Ht 5\' 6"  (1.676 m)  Wt 360 lb (163.295 kg)  BMI 58.13 kg/m2  SpO2 86%     Review of Systems  Musculoskeletal: Positive for gait problem.  Neurological: Positive for dizziness, weakness and numbness.  All other systems reviewed and are negative.       Objective:   Physical Exam The patient is a morbidly obese gentleman  He is oriented x3. Speech is clear. Affect is bright. He is  alert, cooperative, and pleasant.  Follows commands without difficulty.  Answers my questions appropriately.  NEUROLOGIC: Cranial nerves, coordination are intact. His reflexes are  diminished in the lower extremities. He has good strength in  both lower extremities at hip flexors, knee extensors, dorsiflexors and  plantar flexors.  Sensation is intact in the lower extremities. He is  noted to have chronic swelling in lower extremities.Left lower leg is wrapped .  Right Knee  He has painful joint line over his right knee.(medial and lateral)Motion is preserved.  Right knee flexion contracture 5-10 degrees  Symmetric normal motor tone is noted throughout. Normal muscle bulk. Muscle testing reveals 5/5 muscle strength of the upper extremity, and 5/5 of the lower extremity.  DTR in the upper and lower extremity are present and symmetric 2+. No clonus is noted.  Patient is in a wheel chair.        Assessment & Plan:  1. End-stage right knee osteoarthritis. Prescribed Voltaren gel, currently using lidoderm as well as knee sleeve, walker  2. Morbid obesity.  3. Generally deconditioned.  4. Chronic low back pain/spondylosis.  5. Chronic venous insufficiency/lymphedema. Leaking at left lower leg/ wrapped, treated by wound care center  6. SOB, O2 dependent  7. LBP, advised patient to ask his PCP, who prescribes him Robaxin 1 tablet at night, to change the dosage to 3 tablets a day, to help with his muscle spasms/pain in his lower back. He will see his PCP tomorrow. Continue following up with the wound care center for his open wound on the left lower leg. Advised patient to do exercises at home, in a sitting or recumbent position, which I showed him . Continue with his calf pumps.  Filled his medication today.  Follow up in 1 month.

## 2013-01-24 ENCOUNTER — Encounter: Payer: Self-pay | Admitting: Endocrinology

## 2013-01-24 ENCOUNTER — Ambulatory Visit (INDEPENDENT_AMBULATORY_CARE_PROVIDER_SITE_OTHER): Payer: Medicare Other | Admitting: Endocrinology

## 2013-01-24 VITALS — BP 128/80 | HR 78 | Ht 67.0 in | Wt 360.0 lb

## 2013-01-24 DIAGNOSIS — E119 Type 2 diabetes mellitus without complications: Secondary | ICD-10-CM

## 2013-01-24 DIAGNOSIS — E1049 Type 1 diabetes mellitus with other diabetic neurological complication: Secondary | ICD-10-CM

## 2013-01-24 DIAGNOSIS — E1065 Type 1 diabetes mellitus with hyperglycemia: Secondary | ICD-10-CM

## 2013-01-24 LAB — HEMOGLOBIN A1C: Hgb A1c MFr Bld: 6.7 % — ABNORMAL HIGH (ref 4.6–6.5)

## 2013-01-24 MED ORDER — METHOCARBAMOL 500 MG PO TABS: 500.0000 mg | ORAL_TABLET | Freq: Three times a day (TID) | ORAL | Status: DC

## 2013-01-24 NOTE — Progress Notes (Signed)
Subjective:    Patient ID: Blake Summers, male    DOB: 02/20/1946, 67 y.o.   MRN: [id number]  HPI Pt returns for f/u of insulin-requiring DM (dx'ed 1990; he has moderate neuropathy of the lower extremities; he has associated mild CAD, CVA, and foot ulcer).   pt states he feels well in general.  he brings a record of his cbg's which i have reviewed today.  He has mild hypoglycemia approx every other day.  This usually happens at hs, or in am.  Otherwise, it varies from 100-200.   Past Medical History  Diagnosis Date  . GERD 07/06/2010  . Edema 10/28/2007  . DIABETES MELLITUS, TYPE II 05/04/2007  . HYPERLIPIDEMIA 07/08/2008  . ALLERGIC RHINITIS 07/08/2008  . GLAUCOMA 07/08/2008  . CEREBROVASCULAR ACCIDENT, HX OF 07/08/2008  . OBSTRUCTIVE SLEEP APNEA 03/06/2008  . DIABETIC ULCER, LEFT LEG 08/23/2009  . 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 HYPERTENSION 10/28/2007  . VENOUS INSUFFICIENCY 03/08/2009  . FATTY LIVER DISEASE 05/19/2008  . BENIGN PROSTATIC HYPERTROPHY 07/08/2008  . 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 NEPHROLITHIASIS, HX OF 07/08/2008  . Morbid obesity   . DM nephropathy/sclerosis   . 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 Lumbar spondylosis     Past Surgical History  Procedure Laterality Date  . Lithotripsy  2007    History   Social History  . Marital Status: Married    Spouse Name: N/A    Number of Children: N/A  . Years of Education: N/A   Occupational History  . Retired     Nature conservation officer   Social History Main Topics  . Smoking status: Former Smoker -- 0.50 packs/day for 15 years    Types: Cigarettes    Quit date: 06/12/1988  . Smokeless tobacco: Never Used  . Alcohol Use: No  . Drug Use: No  . Sexual Activity: Not on file   Other Topics Concern  . Not on file   Social History Narrative   Diet is "good"   Exercise is limited by medical problems    Current Outpatient Prescriptions on File Prior to Visit  Medication Sig Dispense  Refill  . allopurinol (ZYLOPRIM) 300 MG tablet Take 1 tablet (300 mg total) by mouth 1 day or 1 dose.  30 tablet  3  . brimonidine (ALPHAGAN) 0.15 % ophthalmic solution as directed.      . clobetasol ointment (TEMOVATE) 0.05 % Apply topically 3 (three) times daily.  60 g  4  . DEXILANT 60 MG capsule TAKE 1 CAPSULE BY MOUTH DAILY  30 capsule  6  . diltiazem (CARDIZEM CD) 180 MG 24 hr capsule TAKE 2 CAPSULES BY MOUTH DAILY  60 capsule  3  . dipyridamole-aspirin (AGGRENOX) 200-25 MG per 12 hr capsule TAKE 1 CAPSULE BY MOUTH 2 TIMES         DAILY  60 capsule  3  . docusate sodium (STOOL SOFTENER) 100 MG capsule Take 1 capsule (100 mg total) by mouth 2 (two) times daily.  60 capsule  5  . dorzolamide-timolol (COSOPT) 22.3-6.8 MG/ML ophthalmic solution as directed.      . furosemide (LASIX) 80 MG tablet Take 1 tablet (80 mg total) by mouth daily.  30 tablet  4  . glucose blood (ONE TOUCH TEST STRIPS) test strip Check blood sugar qid       . Insulin Pen Needle (NOVOFINE) 30G X 8 MM MISC USE 4 TIMES A DAY.  200 each  6  .  ketoconazole (NIZORAL) 2 % shampoo as directed.      . latanoprost (XALATAN) 0.005 % ophthalmic solution Place 1 drop into both eyes at bedtime.  2.5 mL  4  . lidocaine (LIDODERM) 5 % Place 1-3 patches onto the skin daily. Remove & Discard patch within 12 hours or as directed by MD  90 patch  2  . loratadine-pseudoephedrine (CLARITIN-D 12 HOUR) 5-120 MG per tablet Take 1 tablet by mouth 2 (two) times daily.  60 tablet  5  . losartan-hydrochlorothiazide (HYZAAR) 100-25 MG per tablet Take 1 tablet by mouth daily.  30 tablet  5  . lovastatin (MEVACOR) 40 MG tablet Take 1 tablet (40 mg total) by mouth at bedtime.  60 tablet  3  . morphine (MS CONTIN) 15 MG 12 hr tablet Take 1 tablet (15 mg total) by mouth 2 (two) times daily.  60 tablet  0  . naproxen sodium (ANAPROX) 220 MG tablet 2 tablets by mouth once daily as needed for pain       . oxyCODONE-acetaminophen (PERCOCET) 7.5-500 MG per  tablet Take 1 tablet by mouth every 8 (eight) hours as needed for pain. 1 tablet by mouth three times a day as needed  90 tablet  0  . penicillin v potassium (VEETID) 500 MG tablet       . polyethylene glycol powder (GLYCOLAX/MIRALAX) powder DISSOLVE ONE CAPFUL ( 17GM) IN 8 TO 10 OUNCES OF WATER DAILY  527 g  6  . potassium chloride SA (KLOR-CON M20) 20 MEQ tablet Take 1 tablet (20 mEq total) by mouth 2 (two) times daily.  60 tablet  11  . PROVENTIL HFA 108 (90 BASE) MCG/ACT inhaler ONE PUFF EVERY 4 HOURS AS NEEDED  6.7 g  5  . ranitidine (ZANTAC) 150 MG tablet 2  At bedtime  60 tablet  5  . testosterone (ANDROGEL) 50 MG/5GM GEL 3 tubes daily  90 Tube  5  . topiramate (TOPAMAX) 50 MG tablet TAKE 1 TABLET BY MOUTH 2 TIMES A DAY  60 tablet  3  . VOLTAREN 1 % GEL APPLY TOPICALLY 4 TIMES DAILY AS PHYSICIAN INSTRUCTED  200 g  3  . zolpidem (AMBIEN) 10 MG tablet Take 10 mg by mouth at bedtime as needed.         No current facility-administered medications on file prior to visit.    Allergies  Allergen Reactions  . Neosporin [Neomycin-Polymyxin-Gramicidin]     Family History  Problem Relation Age of Onset  . Cancer Brother     Colon Cancer  . Heart disease Brother   . Heart disease Brother   . Asthma Mother     BP 128/80  Pulse 78  Ht 5\' 7"  (1.702 m)  Wt 360 lb (163.295 kg)  BMI 56.37 kg/m2  SpO2 94%  Review of Systems Denies LOC.  Pt says the robaxin gives inadequate relief for his cramps.      Objective:   Physical Exam VITAL SIGNS:  See vs page GENERAL: no distress.  In wheelchair.  Morbid obesity.   SKIN:  Insulin injection sites at the anterior abdomen are normal Feet: (sees wound care).    Lab Results  Component Value Date   HGBA1C 6.7* 01/24/2013      Assessment & Plan:  DM: overcontrolled.  This insulin regimen was chosen from multiple options, as it best matches his insulin to his changing requirements throughout the day.  The benefits of glycemic control must be  weighed against the  risks of hypoglycemia.

## 2013-01-24 NOTE — Patient Instructions (Addendum)
Please decrease supper novolog to 140 units.  Also, please decrease the bedtime NPH to 70 units.   blood tests are being requested for you today.  We'll contact you with results.   check your blood sugar twice a day.  vary the time of day when you check, between before the 3 meals, and at bedtime.  also check if you have symptoms of your blood sugar being too high or too low.  please keep a record of the readings and bring it to your next appointment here.  please call us sooner if your blood sugar goes below 70, or if you have a lot of readings over 200. Please come back for a follow-up appointment in 3 months. Increase the methocarbamol to 3x a day as needed.  i have sent a prescription to your pharmacy

## 2013-02-08 ENCOUNTER — Ambulatory Visit (INDEPENDENT_AMBULATORY_CARE_PROVIDER_SITE_OTHER): Payer: Medicare Other | Admitting: Family Medicine

## 2013-02-08 ENCOUNTER — Encounter: Payer: Self-pay | Admitting: Family Medicine

## 2013-02-08 VITALS — BP 130/62 | HR 108 | Temp 98.0°F | Wt 356.8 lb

## 2013-02-08 DIAGNOSIS — J029 Acute pharyngitis, unspecified: Secondary | ICD-10-CM

## 2013-02-08 LAB — POCT RAPID STREP A (OFFICE): Rapid Strep A Screen: NEGATIVE

## 2013-02-08 MED ORDER — PHENOL 1.4 % MT LIQD: 1.0000 | OROMUCOSAL | Status: DC | PRN

## 2013-02-08 NOTE — Patient Instructions (Addendum)
I think you have viral pharyngitis. Treat with chloraseptic spray, continue aleve for throat inflammation. Push fluids and plenty of rest. Salt water gargles. Suck on cold things like popsicles (sugar free) or warm things like herbal teas (whichever soothes the throat better). Return if fever >101.5, worsening pain, or trouble opening/closing mouth, or hoarse voice. Good to see you today, call clinic with questions.

## 2013-02-08 NOTE — Assessment & Plan Note (Signed)
RST negative today. 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 Supportive care as per instructions. Chloraseptic spray and ibuprofen for symptomatic relief. Update if not improving as expected.

## 2013-02-08 NOTE — Addendum Note (Signed)
Addended by: Eustaquio Boyden on: 02/08/2013 02:22 PM   Modules accepted: Orders

## 2013-02-08 NOTE — Progress Notes (Signed)
  Subjective:    Patient ID: Blake Summers, male    DOB: June 12, 1946, 67 y.o.   MRN: [id number]  HPI CC: terrible sore throat  2d h/o ST.  Woke up this morning, trouble swallowing 2/2 pain. So far has tried nothing for this other than cough drops.  Denies fevers/chills, congestion, rhinorrhea, cough, abd pain, nausea, headaches.   No PNdrainage, ear pain or tooth pain.  No new rashes.  Uses oxygen regularly. No smokers at home.  H/o diabetes, CVA hx, HTN, OSA, morbid obesity.  Diabetes has been well controlled recently, followed by Dr. Everardo All. H/o cold sores in past.  Both grandchildren sick with ST recently as well - pt cared for grandchildren last week and exposed.   Past Medical History  Diagnosis Date  . GERD 07/06/2010  . Edema 10/28/2007  . DIABETES MELLITUS, TYPE II 05/04/2007  . HYPERLIPIDEMIA 07/08/2008  . ALLERGIC RHINITIS 07/08/2008  . GLAUCOMA 07/08/2008  . CEREBROVASCULAR ACCIDENT, HX OF 07/08/2008  . OBSTRUCTIVE SLEEP APNEA 03/06/2008  . DIABETIC ULCER, LEFT LEG 08/23/2009  . 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 HYPERTENSION 10/28/2007  . VENOUS INSUFFICIENCY 03/08/2009  . FATTY LIVER DISEASE 05/19/2008  . BENIGN PROSTATIC HYPERTROPHY 07/08/2008  . 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 NEPHROLITHIASIS, HX OF 07/08/2008  . Morbid obesity   . DM nephropathy/sclerosis   . 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 Lumbar spondylosis      Review of Systems Per HPI    Objective:   Physical Exam  Nursing note reviewed. Constitutional: He appears well-developed and well-nourished. No distress.  Morbidly obese  HENT:  Head: Normocephalic and atraumatic.  Right Ear: Hearing, tympanic membrane, external ear and ear canal normal.  Left Ear: Hearing, tympanic membrane, external ear and ear canal normal.  Nose: Nose normal. No mucosal edema or rhinorrhea. Right sinus exhibits no maxillary sinus tenderness and no frontal sinus tenderness. Left sinus  exhibits no maxillary sinus tenderness and no frontal sinus tenderness.  Mouth/Throat: Uvula is midline. Posterior oropharyngeal edema present. No oropharyngeal exudate or tonsillar abscesses.    White erythematous shallow ulcers at posterior soft palate.  Eyes: Conjunctivae and EOM are normal. Pupils are equal, round, and reactive to light. No scleral icterus.  Neck: Normal range of motion. Neck supple.  Cardiovascular: Normal rate, regular rhythm, normal heart sounds and intact distal pulses.   No murmur heard. Pulmonary/Chest: Effort normal and breath sounds normal. No respiratory distress. He has no wheezes. He has no rales.  Lymphadenopathy:    He has no cervical adenopathy.  Skin: Skin is warm and dry. No rash noted.  No other rash appreciated       Assessment & Plan:

## 2013-02-19 ENCOUNTER — Encounter: Payer: Self-pay | Admitting: Physical Medicine and Rehabilitation

## 2013-02-19 ENCOUNTER — Encounter
Payer: Medicare Other | Attending: Physical Medicine and Rehabilitation | Admitting: Physical Medicine and Rehabilitation

## 2013-02-19 VITALS — BP 124/55 | HR 91 | Resp 14 | Ht 67.0 in | Wt 356.0 lb

## 2013-02-19 DIAGNOSIS — M25569 Pain in unspecified knee: Secondary | ICD-10-CM

## 2013-02-19 DIAGNOSIS — M1710 Unilateral primary osteoarthritis, unspecified knee: Secondary | ICD-10-CM

## 2013-02-19 DIAGNOSIS — S8990XA Unspecified injury of unspecified lower leg, initial encounter: Secondary | ICD-10-CM | POA: Insufficient documentation

## 2013-02-19 DIAGNOSIS — I872 Venous insufficiency (chronic) (peripheral): Secondary | ICD-10-CM | POA: Insufficient documentation

## 2013-02-19 DIAGNOSIS — X58XXXA Exposure to other specified factors, initial encounter: Secondary | ICD-10-CM | POA: Insufficient documentation

## 2013-02-19 DIAGNOSIS — M47817 Spondylosis without myelopathy or radiculopathy, lumbosacral region: Secondary | ICD-10-CM | POA: Insufficient documentation

## 2013-02-19 DIAGNOSIS — I890 Lymphedema, not elsewhere classified: Secondary | ICD-10-CM | POA: Insufficient documentation

## 2013-02-19 DIAGNOSIS — Z9981 Dependence on supplemental oxygen: Secondary | ICD-10-CM | POA: Insufficient documentation

## 2013-02-19 DIAGNOSIS — G8929 Other chronic pain: Secondary | ICD-10-CM

## 2013-02-19 DIAGNOSIS — R0602 Shortness of breath: Secondary | ICD-10-CM | POA: Insufficient documentation

## 2013-02-19 DIAGNOSIS — Z79899 Other long term (current) drug therapy: Secondary | ICD-10-CM | POA: Insufficient documentation

## 2013-02-19 DIAGNOSIS — M545 Low back pain: Secondary | ICD-10-CM

## 2013-02-19 DIAGNOSIS — M6250 Muscle wasting and atrophy, not elsewhere classified, unspecified site: Secondary | ICD-10-CM | POA: Insufficient documentation

## 2013-02-19 DIAGNOSIS — M1711 Unilateral primary osteoarthritis, right knee: Secondary | ICD-10-CM

## 2013-02-19 MED ORDER — OXYCODONE-ACETAMINOPHEN 7.5-500 MG PO TABS: 1.0000 | ORAL_TABLET | Freq: Three times a day (TID) | ORAL | Status: DC | PRN

## 2013-02-19 MED ORDER — MORPHINE SULFATE ER 15 MG PO TBCR: 15.0000 mg | EXTENDED_RELEASE_TABLET | Freq: Two times a day (BID) | ORAL | Status: DC

## 2013-02-19 NOTE — Progress Notes (Signed)
Subjective:    Patient ID: Blake Summers, male    DOB: 07-Oct-1945, 67 y.o.   MRN: [id number]  HPI The patient complains about chronic right knee pain. The patient denies any radiation. The patient also complains about an open wound on his left lower leg, which is treated by the wound care center.   The knee problem has been stable,he still sees wound care for his open wound . The patient had to interrupt his physical therapy in the water because of the open wound. The patient states, that he had difficulties breathing, he saw his pulmonologist, and is now on O2. He reports that a HH-nurse was visiting him and educated him about his diabetis, medication and diet. He is now eating more healthy. He also reports, that his left lower leg is leaking fluid again, he was taking Ax for 4 weeks, and is now following up with the wound care center. He is also still using his calf pumps regularly.  He also complains about LBP, especially after sitting so long in his wheel chair. Otherwise the problem has been stable  Pain Inventory Average Pain 8 Pain Right Now 8 My pain is sharp, burning, stabbing and aching  In the last 24 hours, has pain interfered with the following? General activity 8 Relation with others 9 Enjoyment of life 8 What TIME of day is your pain at its worst? evening Sleep (in general) Poor  Pain is worse with: walking and standing Pain improves with: rest, heat/ice, medication and injections Relief from Meds: 7  Mobility walk with assistance use a cane use a walker ability to climb steps?  yes do you drive?  yes use a wheelchair needs help with transfers  Function retired I need assistance with the following:  dressing, meal prep, household duties and shopping  Neuro/Psych weakness numbness tingling trouble walking dizziness  Prior Studies Any changes since last visit?  no  Physicians involved in your care Any changes since last visit?  no   Family History   Problem Relation Age of Onset  . Cancer Brother     Colon Cancer  . Heart disease Brother   . Heart disease Brother   . Asthma Mother    History   Social History  . Marital Status: Married    Spouse Name: N/A    Number of Children: N/A  . Years of Education: N/A   Occupational History  . Retired     Nature conservation officer   Social History Main Topics  . Smoking status: Former Smoker -- 0.50 packs/day for 15 years    Types: Cigarettes    Quit date: 06/12/1988  . Smokeless tobacco: Never Used  . Alcohol Use: No  . Drug Use: No  . Sexual Activity: None   Other Topics Concern  . None   Social History Narrative   Diet is "good"   Exercise is limited by medical problems   Past Surgical History  Procedure Laterality Date  . Lithotripsy  2007   Past Medical History  Diagnosis Date  . GERD 07/06/2010  . Edema 10/28/2007  . DIABETES MELLITUS, TYPE II 05/04/2007  . HYPERLIPIDEMIA 07/08/2008  . ALLERGIC RHINITIS 07/08/2008  . GLAUCOMA 07/08/2008  . CEREBROVASCULAR ACCIDENT, HX OF 07/08/2008  . OBSTRUCTIVE SLEEP APNEA 03/06/2008  . DIABETIC ULCER, LEFT LEG 08/23/2009  . 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 HYPERTENSION 10/28/2007  . VENOUS INSUFFICIENCY 03/08/2009  . FATTY LIVER DISEASE 05/19/2008  . BENIGN PROSTATIC HYPERTROPHY 07/08/2008  . ERECTILE DYSFUNCTION, ORGANIC  08/23/2009  . NEPHROLITHIASIS, HX OF 07/08/2008  . Morbid obesity   . DM nephropathy/sclerosis   . 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 Lumbar spondylosis    BP 124/55  Pulse 91  Resp 14  Ht 5\' 7"  (1.702 m)  Wt 356 lb (161.481 kg)  BMI 55.74 kg/m2  SpO2 85%     Review of Systems  Musculoskeletal: Positive for gait problem.  Neurological: Positive for weakness and numbness.       Tingling  All other systems reviewed and are negative.       Objective:   Physical Exam The patient is a morbidly obese gentleman  He is oriented x3. Speech is clear. Affect is bright. He is  alert,  cooperative, and pleasant. Follows commands without difficulty.  Answers my questions appropriately.  NEUROLOGIC: Cranial nerves, coordination are intact. His reflexes are  diminished in the lower extremities. He has good strength in  both lower extremities at hip flexors, knee extensors, dorsiflexors and  plantar flexors.  Sensation is intact in the lower extremities. He is  noted to have chronic swelling in lower extremities.Left lower leg is wrapped .  Right Knee  He has painful joint line over his right knee.(medial and lateral)Motion is preserved.  Right knee flexion contracture 5-10 degrees  Symmetric normal motor tone is noted throughout. Normal muscle bulk. Muscle testing reveals 5/5 muscle strength of the upper extremity, and 5/5 of the lower extremity.  DTR in the upper and lower extremity are present and symmetric 2+. No clonus is noted.  Patient is in a wheel chair.        Assessment & Plan:  1. End-stage right knee osteoarthritis. Prescribed Voltaren gel, currently using lidoderm as well as knee sleeve, walker  2. Morbid obesity.  3. Generally deconditioned.  4. Chronic low back pain/spondylosis.  5. Chronic venous insufficiency/lymphedema. Leaking at left lower leg/ wrapped, treated by wound care center  6. SOB, O2 dependent  7. LBP, advised patient to ask his PCP, who prescribes him Robaxin 1 tablet at night, to change the dosage to 3 tablets a day, to help with his muscle spasms/pain in his lower back. He saw his PCP and he increased the Robaxin which has helped .  Continue following up with the wound care center for his open wound on the left lower leg. Advised patient to do exercises at home, in a sitting or recumbent position, which I showed him . Continue with his calf pumps.  Filled his medication today.  Follow up in 1 month.

## 2013-02-19 NOTE — Patient Instructions (Signed)
Stay as active as pain permits 

## 2013-03-03 ENCOUNTER — Other Ambulatory Visit: Payer: Self-pay | Admitting: Physical Medicine and Rehabilitation

## 2013-03-03 ENCOUNTER — Other Ambulatory Visit: Payer: Self-pay

## 2013-03-03 MED ORDER — LOSARTAN POTASSIUM-HCTZ 100-25 MG PO TABS: 1.0000 | ORAL_TABLET | Freq: Every day | ORAL | Status: DC

## 2013-03-03 MED ORDER — INSULIN NPH (HUMAN) (ISOPHANE) 100 UNIT/ML ~~LOC~~ SUSP: 70.0000 [IU] | Freq: Every day | SUBCUTANEOUS | Status: DC

## 2013-03-03 MED ORDER — LOVASTATIN 40 MG PO TABS: 40.0000 mg | ORAL_TABLET | Freq: Every day | ORAL | Status: DC

## 2013-03-03 MED ORDER — RANITIDINE HCL 150 MG PO TABS: ORAL_TABLET | ORAL | Status: DC

## 2013-03-03 MED ORDER — POTASSIUM CHLORIDE CRYS ER 20 MEQ PO TBCR: 20.0000 meq | EXTENDED_RELEASE_TABLET | Freq: Two times a day (BID) | ORAL | Status: DC

## 2013-03-06 ENCOUNTER — Ambulatory Visit: Payer: Medicare Other | Admitting: Pulmonary Disease

## 2013-03-10 ENCOUNTER — Telehealth: Payer: Self-pay | Admitting: Pulmonary Disease

## 2013-03-10 NOTE — Telephone Encounter (Signed)
I spoke with pt. He stated he was released from [facility] on Saturday. He stated he was told his O2 level was low and per pt he was told something about his carbon dioxide level in his blood but does not remember what they had said. He stated he was told he needed to f/u with [facility] in 1 week. Pt currently on O2 24/7 and he wears his CPAP every night per pt. Please advise were pt can be worked in. Pt was scheduled for 03/06/13 but he was in the hospital. Please advise KC thanks

## 2013-03-10 NOTE — Telephone Encounter (Signed)
I obviously do not have any apptm available unless ashtyn can find where someone cancelled.  Check with her, but if none, see if TP can see him.

## 2013-03-11 NOTE — Telephone Encounter (Signed)
Pt is scheduled to see TP on Friday at 10:15. Nothing further needed

## 2013-03-14 ENCOUNTER — Encounter: Payer: Self-pay | Admitting: Adult Health

## 2013-03-14 ENCOUNTER — Ambulatory Visit (INDEPENDENT_AMBULATORY_CARE_PROVIDER_SITE_OTHER): Payer: Medicare Other | Admitting: Adult Health

## 2013-03-14 VITALS — BP 144/72 | HR 91 | Temp 98.6°F | Wt 340.4 lb

## 2013-03-14 DIAGNOSIS — E662 Morbid (severe) obesity with alveolar hypoventilation: Secondary | ICD-10-CM

## 2013-03-14 NOTE — Assessment & Plan Note (Signed)
Recent hospitalization with acute on chronic hypercarbic and hypoxic respiratory failure with underlying obstructive sleep apnea, and OHS. Patient is encouraged on weight loss, low-sodium intake, and compliance with his oxygen and CPAP machine. We will obtain a CPAP download. Patient reports that he was rejected from his insurance on BiPAP. Last year  recent admission with a PCO2 of greater then 90-may consider retrying insurance for consideration of BiPAP support.

## 2013-03-14 NOTE — Progress Notes (Signed)
  Subjective:    Patient ID: Blake Summers, male    DOB: 03-May-1946, 67 y.o.   MRN: [id number]  HPI 67 yo male with known hx of OSA on CPAP   03/14/2013 Post Hospital follow up  Patient presents for a post hospital follow up. Patient was admitted at [facility] on September 26 through September 27 for acute on chronic hypercarbic and hypoxic respiratory failure complicated by underlying obstructive sleep apnea, and obesity hypoventilation syndrome. Patient had admitting, PCO2 of 91. He was treated with BiPAP. Chest x-ray showed cardiomegaly, pulmonary vascular congestion, and basilar atelectasis with no acute findings noted. Patient reports that since discharge . That he is feeling much improved. He is wearing his CPAP and oxygen on a consistent basis with no missed days. He is also taking his diuretics without any missed doses. Patient denies any hemoptysis, orthopnea, PND, or leg swelling. Last seen 01/2012 in our pulmonary clinic.  Since discharge. Weight is down another 10 pounds and leg swelling. Has been less. (HP records reviewed and scanned into system)    Review of Systems Constitutional:   No  weight loss, night sweats,  Fevers, chills, fatigue, or  lassitude.  HEENT:   No headaches,  Difficulty swallowing,  Tooth/dental problems, or  Sore throat,                No sneezing, itching, ear ache,  +nasal congestion, post nasal drip,   CV:  No chest pain,  Orthopnea, PND,  anasarca, dizziness, palpitations, syncope.   GI  No heartburn, indigestion, abdominal pain, nausea, vomiting, diarrhea, change in bowel habits, loss of appetite, bloody stools.   Resp:  No chest wall deformity  Skin: no rash or lesions.  GU: no dysuria, change in color of urine, no urgency or frequency.  No flank pain, no hematuria   MS:  No joint pain or swelling.  No decreased range of motion.  No back pain.  Psych:  No change in mood or affect. No depression or anxiety.  No memory  loss.          Objective:   Physical Exam  GEN: A/Ox3; pleasant , NAD morbidly obese in wheelchair   HEENT:  [place]/AT,  EACs-clear, TMs-wnl, NOSE-clear, THROAT-clear, no lesions, no postnasal drip or exudate noted.   NECK:  Supple w/ fair ROM; no JVD; normal carotid impulses w/o bruits; no thyromegaly or nodules palpated; no lymphadenopathy.  RESP Diminshed BS in bases .no accessory muscle use, no dullness to percussion  CARD:  RRR, no m/r/g  , tr-1 peripheral edema, pulses intact, no cyanosis or clubbing.  GI:   Soft & nt; nml bowel sounds; no organomegaly or masses detected.  Musco: Warm bil, no deformities or joint swelling noted.   Neuro: alert, no focal deficits noted.    Skin: Warm, no lesions or rashes        Assessment & Plan:

## 2013-03-14 NOTE — Patient Instructions (Addendum)
Wear CPAP At bedtime and with naps.  Work on weight loss.  Follow up Dr. Shelle Iron in 4 weeks. And As needed   CPAP download requested.

## 2013-03-14 NOTE — Progress Notes (Signed)
Ov reviewed and agree with plan as outlined.  

## 2013-03-17 NOTE — Addendum Note (Signed)
Addended by: Boone Master E on: 03/17/2013 10:18 AM   Modules accepted: Orders

## 2013-03-18 ENCOUNTER — Encounter
Payer: Medicare Other | Attending: Physical Medicine and Rehabilitation | Admitting: Physical Medicine and Rehabilitation

## 2013-03-18 ENCOUNTER — Encounter: Payer: Self-pay | Admitting: Physical Medicine and Rehabilitation

## 2013-03-18 VITALS — BP 141/55 | HR 96 | Resp 16 | Ht 67.0 in | Wt 340.0 lb

## 2013-03-18 DIAGNOSIS — M1710 Unilateral primary osteoarthritis, unspecified knee: Secondary | ICD-10-CM | POA: Insufficient documentation

## 2013-03-18 DIAGNOSIS — G8929 Other chronic pain: Secondary | ICD-10-CM | POA: Insufficient documentation

## 2013-03-18 DIAGNOSIS — R0602 Shortness of breath: Secondary | ICD-10-CM | POA: Insufficient documentation

## 2013-03-18 DIAGNOSIS — M6250 Muscle wasting and atrophy, not elsewhere classified, unspecified site: Secondary | ICD-10-CM | POA: Insufficient documentation

## 2013-03-18 DIAGNOSIS — I890 Lymphedema, not elsewhere classified: Secondary | ICD-10-CM | POA: Insufficient documentation

## 2013-03-18 DIAGNOSIS — M170 Bilateral primary osteoarthritis of knee: Secondary | ICD-10-CM

## 2013-03-18 DIAGNOSIS — M47817 Spondylosis without myelopathy or radiculopathy, lumbosacral region: Secondary | ICD-10-CM | POA: Insufficient documentation

## 2013-03-18 DIAGNOSIS — I872 Venous insufficiency (chronic) (peripheral): Secondary | ICD-10-CM | POA: Insufficient documentation

## 2013-03-18 DIAGNOSIS — Z9981 Dependence on supplemental oxygen: Secondary | ICD-10-CM | POA: Insufficient documentation

## 2013-03-18 MED ORDER — OXYCODONE-ACETAMINOPHEN 7.5-500 MG PO TABS: 1.0000 | ORAL_TABLET | Freq: Three times a day (TID) | ORAL | Status: DC | PRN

## 2013-03-18 MED ORDER — MORPHINE SULFATE ER 15 MG PO TBCR: 15.0000 mg | EXTENDED_RELEASE_TABLET | Freq: Two times a day (BID) | ORAL | Status: DC

## 2013-03-18 NOTE — Progress Notes (Signed)
Subjective:    Patient ID: Blake Summers, male    DOB: [birth year], 67 y.o.   MRN: [id number]  HPI The patient complains about chronic right knee pain. The patient denies any radiation. The patient also complains about an open wound on his left lower leg, which is treated by the wound care center.  The knee problem has been stable,he still sees wound care for his open wound . The patient had to interrupt his physical therapy in the water because of the open wound. The patient states, that he had difficulties breathing, he saw his pulmonologist, and is now on O2. He reports that a HH-nurse was visiting him and educated him about his diabetis, medication and diet. He is now eating more healthy. He also reports, that his left lower leg is leaking fluid again, he was taking Ax for 4 weeks, and is now following up with the wound care center. He is also still using his calf pumps regularly.  He also complains about LBP, especially after sitting so long in his wheel chair.  He reports that he was admitted to the Hospital, [facility], for acute respiratory failure, he is on O2, 24/7 now. Otherwise the problem has been stable  Pain Inventory Average Pain 7 Pain Right Now 8 My pain is sharp, burning, stabbing and aching  In the last 24 hours, has pain interfered with the following? General activity 8 Relation with others 9 Enjoyment of life 8 What TIME of day is your pain at its worst? daytime Sleep (in general) Poor  Pain is worse with: walking and standing Pain improves with: rest, heat/ice, medication and injections Relief from Meds: 7  Mobility walk with assistance use a cane use a walker ability to climb steps?  no do you drive?  yes use a wheelchair needs help with transfers  Function retired I need assistance with the following:  dressing, meal prep, household duties and shopping  Neuro/Psych numbness tingling trouble walking dizziness  Prior Studies Any changes since  last visit?  yes hospitalized for breathing issues  Physicians involved in your care Any changes since last visit?  no   Family History  Problem Relation Age of Onset  . Cancer Brother     Colon Cancer  . Heart disease Brother   . Heart disease Brother   . Asthma Mother    History   Social History  . Marital Status: Married    Spouse Name: N/A    Number of Children: N/A  . Years of Education: N/A   Occupational History  . Retired     Nature conservation officer   Social History Main Topics  . Smoking status: Former Smoker -- 0.50 packs/day for 15 years    Types: Cigarettes    Quit date: 06/12/1988  . Smokeless tobacco: Never Used  . Alcohol Use: No  . Drug Use: No  . Sexual Activity: None   Other Topics Concern  . None   Social History Narrative   Diet is "good"   Exercise is limited by medical problems   Past Surgical History  Procedure Laterality Date  . Lithotripsy  2007   Past Medical History  Diagnosis Date  . GERD 07/06/2010  . Edema 10/28/2007  . DIABETES MELLITUS, TYPE II 05/04/2007  . HYPERLIPIDEMIA 07/08/2008  . ALLERGIC RHINITIS 07/08/2008  . GLAUCOMA 07/08/2008  . CEREBROVASCULAR ACCIDENT, HX OF 07/08/2008  . OBSTRUCTIVE SLEEP APNEA 03/06/2008  . DIABETIC ULCER, LEFT LEG 08/23/2009  . 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  HYPERTENSION 10/28/2007  . VENOUS INSUFFICIENCY 03/08/2009  . FATTY LIVER DISEASE 05/19/2008  . BENIGN PROSTATIC HYPERTROPHY 07/08/2008  . 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 NEPHROLITHIASIS, HX OF 07/08/2008  . Morbid obesity   . DM nephropathy/sclerosis   . 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 Lumbar spondylosis    BP 141/55  Pulse 96  Resp 16  Ht 5\' 7"  (1.702 m)  Wt 340 lb (154.223 kg)  BMI 53.24 kg/m2  SpO2 92%     Review of Systems  Musculoskeletal: Positive for back pain and gait problem.  Neurological: Positive for dizziness and numbness.  All other systems reviewed and are negative.        Objective:   Physical Exam The patient is a morbidly obese gentleman  He is oriented x3. Speech is clear. Affect is bright. He is  alert, cooperative, and pleasant. Follows commands without difficulty.  Answers my questions appropriately.  NEUROLOGIC: Cranial nerves, coordination are intact. His reflexes are  diminished in the lower extremities. He has good strength in  both lower extremities at hip flexors, knee extensors, dorsiflexors and  plantar flexors.  Sensation is intact in the lower extremities. He is  noted to have chronic swelling in lower extremities.Left lower leg is wrapped .  Right Knee  He has painful joint line over his right knee.(medial and lateral)Motion is preserved.  Right knee flexion contracture 5-10 degrees  Symmetric normal motor tone is noted throughout. Normal muscle bulk. Muscle testing reveals 5/5 muscle strength of the upper extremity, and 5/5 of the lower extremity.  DTR in the upper and lower extremity are present and symmetric 2+. No clonus is noted.  Patient is in a wheel chair.        Assessment & Plan:  1. End-stage right knee osteoarthritis. Prescribed Voltaren gel, currently using lidoderm as well as knee sleeve, walker  2. Morbid obesity.  3. Generally deconditioned.  4. Chronic low back pain/spondylosis.  5. Chronic venous insufficiency/lymphedema. Leaking at left lower leg/ wrapped, treated by wound care center  6. SOB, O2 dependent  7. LBP, advised patient to ask his PCP, who prescribes him Robaxin 1 tablet at night, to change the dosage to 3 tablets a day, to help with his muscle spasms/pain in his lower back. He saw his PCP and he increased the Robaxin which has helped .  Continue following up with the wound care center for his open wound on the left lower leg. Advised patient to do exercises at home, in a sitting or recumbent position, which I showed him . Continue with his calf pumps. He reports that he was admitted to the Hospital,  [facility], for acute respiratory failure, he is on O2, 24/7 now.  Filled his medication today.  Follow up in 1 month.

## 2013-03-18 NOTE — Patient Instructions (Signed)
Stay as active as tolerated. 

## 2013-03-20 ENCOUNTER — Encounter: Payer: Self-pay | Admitting: Endocrinology

## 2013-03-20 ENCOUNTER — Ambulatory Visit (INDEPENDENT_AMBULATORY_CARE_PROVIDER_SITE_OTHER): Payer: Medicare Other | Admitting: Endocrinology

## 2013-03-20 VITALS — BP 136/82 | HR 90 | Wt 341.0 lb

## 2013-03-20 DIAGNOSIS — E1049 Type 1 diabetes mellitus with other diabetic neurological complication: Secondary | ICD-10-CM

## 2013-03-20 MED ORDER — ZOLPIDEM TARTRATE 10 MG PO TABS: 10.0000 mg | ORAL_TABLET | Freq: Every evening | ORAL | Status: DC | PRN

## 2013-03-20 NOTE — Progress Notes (Signed)
Subjective:    Patient ID: Blake Summers, male    DOB: May 08, 1946, 67 y.o.   MRN: [id number]  HPI Pt returns for f/u of insulin-requiring DM (dx'ed 1990; he has moderate neuropathy of the lower extremities; he has associated mild CAD, CVA, and foot ulcer; he has never had severe hypoglycemia or DKA).   He was recently hospitalized at [facility] for respiratory failure.  He feels better since then.  At hosp d/c, he resumed his home insulin.  Since then, he says cbg's are much better.  Remus Loffler works well.   Past Medical History  Diagnosis Date  . GERD 07/06/2010  . Edema 10/28/2007  . DIABETES MELLITUS, TYPE II 05/04/2007  . HYPERLIPIDEMIA 07/08/2008  . ALLERGIC RHINITIS 07/08/2008  . GLAUCOMA 07/08/2008  . CEREBROVASCULAR ACCIDENT, HX OF 07/08/2008  . OBSTRUCTIVE SLEEP APNEA 03/06/2008  . DIABETIC ULCER, LEFT LEG 08/23/2009  . 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 HYPERTENSION 10/28/2007  . VENOUS INSUFFICIENCY 03/08/2009  . FATTY LIVER DISEASE 05/19/2008  . BENIGN PROSTATIC HYPERTROPHY 07/08/2008  . 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 NEPHROLITHIASIS, HX OF 07/08/2008  . Morbid obesity   . DM nephropathy/sclerosis   . 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 Lumbar spondylosis     Past Surgical History  Procedure Laterality Date  . Lithotripsy  2007    History   Social History  . Marital Status: Married    Spouse Name: N/A    Number of Children: N/A  . Years of Education: N/A   Occupational History  . Retired     Nature conservation officer   Social History Main Topics  . Smoking status: Former Smoker -- 0.50 packs/day for 15 years    Types: Cigarettes    Quit date: 06/12/1988  . Smokeless tobacco: Never Used  . Alcohol Use: No  . Drug Use: No  . Sexual Activity: Not on file   Other Topics Concern  . Not on file   Social History Narrative   Diet is "good"   Exercise is limited by medical problems    Current Outpatient Prescriptions on File Prior to Visit   Medication Sig Dispense Refill  . allopurinol (ZYLOPRIM) 300 MG tablet Take 1 tablet (300 mg total) by mouth 1 day or 1 dose.  30 tablet  3  . brimonidine (ALPHAGAN) 0.15 % ophthalmic solution as directed.      . clobetasol ointment (TEMOVATE) 0.05 % Apply topically 3 (three) times daily.  60 g  4  . DEXILANT 60 MG capsule TAKE 1 CAPSULE BY MOUTH DAILY  30 capsule  6  . diltiazem (CARDIZEM CD) 180 MG 24 hr capsule TAKE 2 CAPSULES BY MOUTH DAILY  60 capsule  3  . dipyridamole-aspirin (AGGRENOX) 200-25 MG per 12 hr capsule TAKE 1 CAPSULE BY MOUTH 2 TIMES         DAILY  60 capsule  3  . docusate sodium (STOOL SOFTENER) 100 MG capsule Take 1 capsule (100 mg total) by mouth 2 (two) times daily.  60 capsule  5  . dorzolamide-timolol (COSOPT) 22.3-6.8 MG/ML ophthalmic solution as directed.      . furosemide (LASIX) 80 MG tablet Take 1 tablet (80 mg total) by mouth daily.  30 tablet  4  . glucose blood (ONE TOUCH TEST STRIPS) test strip Check blood sugar qid       . insulin aspart (NOVOLOG) 100 UNIT/ML injection 3x a day (just before each meal) 90-120-140 units      .  insulin NPH (HUMULIN N,NOVOLIN N) 100 UNIT/ML injection Inject 70 Units into the skin at bedtime.  1 vial  6  . Insulin Pen Needle (NOVOFINE) 30G X 8 MM MISC USE 4 TIMES A DAY.  200 each  6  . ketoconazole (NIZORAL) 2 % shampoo as directed.      . latanoprost (XALATAN) 0.005 % ophthalmic solution Place 1 drop into both eyes at bedtime.  2.5 mL  4  . lidocaine (LIDODERM) 5 % Place 1-3 patches onto the skin daily. Remove & Discard patch within 12 hours or as directed by MD  90 patch  2  . loratadine-pseudoephedrine (CLARITIN-D 12 HOUR) 5-120 MG per tablet Take 1 tablet by mouth 2 (two) times daily.  60 tablet  5  . losartan-hydrochlorothiazide (HYZAAR) 100-25 MG per tablet Take 1 tablet by mouth daily.  30 tablet  5  . lovastatin (MEVACOR) 40 MG tablet Take 1 tablet (40 mg total) by mouth at bedtime.  60 tablet  3  . metFORMIN  (GLUCOPHAGE-XR) 500 MG 24 hr tablet       . methocarbamol (ROBAXIN) 500 MG tablet Take 1 tablet (500 mg total) by mouth 3 (three) times daily. As needed for cramps.  90 tablet  11  . morphine (MS CONTIN) 15 MG 12 hr tablet Take 1 tablet (15 mg total) by mouth 2 (two) times daily.  60 tablet  0  . naproxen sodium (ANAPROX) 220 MG tablet 2 tablets by mouth once daily as needed for pain       . oxyCODONE-acetaminophen (PERCOCET) 7.5-500 MG per tablet Take 1 tablet by mouth every 8 (eight) hours as needed for pain. 1 tablet by mouth three times a day as needed  90 tablet  0  . phenol (CHLORASEPTIC) 1.4 % LIQD Use as directed 1 spray in the mouth or throat as needed.  118 mL  0  . polyethylene glycol powder (GLYCOLAX/MIRALAX) powder DISSOLVE ONE CAPFUL ( 17GM) IN 8 TO 10 OUNCES OF WATER DAILY  527 g  6  . potassium chloride SA (KLOR-CON M20) 20 MEQ tablet Take 1 tablet (20 mEq total) by mouth 2 (two) times daily.  60 tablet  11  . PROVENTIL HFA 108 (90 BASE) MCG/ACT inhaler ONE PUFF EVERY 4 HOURS AS NEEDED  6.7 g  5  . ranitidine (ZANTAC) 150 MG tablet 2  At bedtime  60 tablet  5  . testosterone (ANDROGEL) 50 MG/5GM GEL 3 tubes daily  90 Tube  5  . topiramate (TOPAMAX) 50 MG tablet TAKE 1 TABLET BY MOUTH 2 TIMES A DAY  60 tablet  3  . VOLTAREN 1 % GEL APPLY TOPICALLY 4 TIMES DAILY AS PHYSICIAN INSTRUCTED  200 g  3   No current facility-administered medications on file prior to visit.    Allergies  Allergen Reactions  . Neosporin [Neomycin-Polymyxin-Gramicidin]     Family History  Problem Relation Age of Onset  . Cancer Brother     Colon Cancer  . Heart disease Brother   . Heart disease Brother   . Asthma Mother     BP 136/82  Pulse 90  Wt 341 lb (154.677 kg)  BMI 53.4 kg/m2  SpO2 92%  Review of Systems Denies LOC; he has lost a few lbs.      Objective:   Physical Exam VITAL SIGNS:  See vs page GENERAL: no distress.  Morbid obesity; he has 02 on. In wheelchair.   PSYCH: Alert  and oriented x 3.  Does not appear anxious nor depressed.     Assessment & Plan:  DM: well-controlled Insomnia: well-controlled Morbid obesity: this complicates various med problems he has.

## 2013-03-20 NOTE — Patient Instructions (Signed)
check your blood sugar twice a day.  vary the time of day when you check, between before the 3 meals, and at bedtime.  also check if you have symptoms of your blood sugar being too high or too low.  please keep a record of the readings and bring it to your next appointment here.  please call us sooner if your blood sugar goes below 70, or if you have a lot of readings over 200.   Please come back for a follow-up appointment in 6 weeks.

## 2013-03-28 ENCOUNTER — Other Ambulatory Visit: Payer: Self-pay

## 2013-03-28 MED ORDER — DOCUSATE SODIUM 100 MG PO CAPS: 100.0000 mg | ORAL_CAPSULE | Freq: Two times a day (BID) | ORAL | Status: DC

## 2013-04-02 ENCOUNTER — Other Ambulatory Visit: Payer: Self-pay

## 2013-04-02 MED ORDER — LOSARTAN POTASSIUM-HCTZ 100-25 MG PO TABS: 1.0000 | ORAL_TABLET | Freq: Every day | ORAL | Status: DC

## 2013-04-15 ENCOUNTER — Encounter
Payer: Medicare Other | Attending: Physical Medicine and Rehabilitation | Admitting: Physical Medicine and Rehabilitation

## 2013-04-15 ENCOUNTER — Encounter: Payer: Self-pay | Admitting: Physical Medicine and Rehabilitation

## 2013-04-15 VITALS — BP 152/71 | HR 95 | Resp 14 | Ht 67.0 in | Wt 340.0 lb

## 2013-04-15 DIAGNOSIS — M1710 Unilateral primary osteoarthritis, unspecified knee: Secondary | ICD-10-CM

## 2013-04-15 DIAGNOSIS — G8929 Other chronic pain: Secondary | ICD-10-CM | POA: Insufficient documentation

## 2013-04-15 DIAGNOSIS — I872 Venous insufficiency (chronic) (peripheral): Secondary | ICD-10-CM | POA: Insufficient documentation

## 2013-04-15 DIAGNOSIS — I890 Lymphedema, not elsewhere classified: Secondary | ICD-10-CM | POA: Insufficient documentation

## 2013-04-15 DIAGNOSIS — M170 Bilateral primary osteoarthritis of knee: Secondary | ICD-10-CM

## 2013-04-15 DIAGNOSIS — M47817 Spondylosis without myelopathy or radiculopathy, lumbosacral region: Secondary | ICD-10-CM | POA: Insufficient documentation

## 2013-04-15 DIAGNOSIS — R5381 Other malaise: Secondary | ICD-10-CM | POA: Insufficient documentation

## 2013-04-15 MED ORDER — OXYCODONE-ACETAMINOPHEN 7.5-500 MG PO TABS: 1.0000 | ORAL_TABLET | Freq: Three times a day (TID) | ORAL | Status: DC | PRN

## 2013-04-15 MED ORDER — MORPHINE SULFATE ER 15 MG PO TBCR: 15.0000 mg | EXTENDED_RELEASE_TABLET | Freq: Two times a day (BID) | ORAL | Status: DC

## 2013-04-15 NOTE — Patient Instructions (Signed)
Try to stay as active as tolerated 

## 2013-04-15 NOTE — Progress Notes (Signed)
Subjective:    Patient ID: Blake Summers, male    DOB: 11/02/1945, 67 y.o.   MRN: [id number]  HPI The patient complains about chronic right knee pain. The patient denies any radiation. The patient also complains about an open wound on his left lower leg, which is treated by the wound care center.  The knee problem has been stable,he still sees wound care for his open wound . The patient had to interrupt his physical [place] the [place] because of the open wound.He reports that a HH-nurse was visiting him and educated him about his diabetis, medication and diet. He is now eating more healthy. He also reports, that his left lower leg is leaking fluid again, he was taking Ax for 4 weeks, and is now following up with the wound care center. He is also still using his calf pumps regularly.  He also complains about LBP, especially after sitting so long in his wheel chair.  He reports that he was admitted to the Hospital, [facility], for acute respiratory failure, he is on O2, 24/7 now.  Otherwise the problem has been stable  Pain Inventory Average Pain 8 Pain Right Now 7 My pain is sharp, burning, stabbing and aching  In the last 24 hours, has pain interfered with the following? General activity 7 Relation with others 8 Enjoyment of life 8 What TIME of day is your pain at its worst? daytime Sleep (in general) Poor  Pain is worse with: walking and standing Pain improves with: rest, heat/ice, medication and injections Relief from Meds: 7  Mobility walk with assistance use a cane use a walker ability to climb steps?  yes do you drive?  yes use a wheelchair needs help with transfers  Function retired I need assistance with the following:  dressing, bathing, meal prep, household duties and shopping  Neuro/Psych weakness numbness tingling trouble walking dizziness  Prior Studies Any changes since last visit?  no  Physicians involved in your care Any changes since last visit?   no   Family History  Problem Relation Age of Onset  . Cancer Brother     Colon Cancer  . Heart disease Brother   . Heart disease Brother   . Asthma Mother    History   Social History  . Marital Status: Married    Spouse Name: N/A    Number of Children: N/A  . Years of Education: N/A   Occupational History  . Retired     Nature conservation officer   Social History Main Topics  . Smoking status: Former Smoker -- 0.50 packs/day for 15 years    Types: Cigarettes    Quit date: 06/12/1988  . Smokeless tobacco: Never Used  . Alcohol Use: No  . Drug Use: No  . Sexual Activity: None   Other Topics Concern  . None   Social History Narrative   Diet is "good"   Exercise is limited by medical problems   Past Surgical History  Procedure Laterality Date  . Lithotripsy  2007   Past Medical History  Diagnosis Date  . GERD 07/06/2010  . Edema 10/28/2007  . DIABETES MELLITUS, TYPE II 05/04/2007  . HYPERLIPIDEMIA 07/08/2008  . ALLERGIC RHINITIS 07/08/2008  . GLAUCOMA 07/08/2008  . CEREBROVASCULAR ACCIDENT, HX OF 07/08/2008  . OBSTRUCTIVE SLEEP APNEA 03/06/2008  . DIABETIC ULCER, LEFT LEG 08/23/2009  . 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 HYPERTENSION 10/28/2007  . VENOUS INSUFFICIENCY 03/08/2009  . FATTY LIVER DISEASE 05/19/2008  . BENIGN PROSTATIC HYPERTROPHY 07/08/2008  .  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 NEPHROLITHIASIS, HX OF 07/08/2008  . Morbid obesity   . DM nephropathy/sclerosis   . 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 Lumbar spondylosis    BP 152/71  Pulse 95  Resp 14  Ht 5\' 7"  (1.702 m)  Wt 340 lb (154.223 kg)  BMI 53.24 kg/m2  SpO2 95%     Review of Systems  Musculoskeletal: Positive for back pain and gait problem.  Neurological: Positive for dizziness, weakness and numbness.       Tingling  All other systems reviewed and are negative.       Objective:   Physical Exam The patient is a morbidly obese gentleman  He is oriented x3.  Speech is clear. Affect is bright. He is  alert, cooperative, and pleasant. Follows commands without difficulty.  Answers my questions appropriately.  NEUROLOGIC: Cranial nerves, coordination are intact. His reflexes are  diminished in the lower extremities. He has good strength in  both lower extremities at hip flexors, knee extensors, dorsiflexors and  plantar flexors.  Sensation is intact in the lower extremities. He is  noted to have chronic swelling in lower extremities.Left lower leg is wrapped .  Right Knee  He has painful joint line over his right knee.(medial and lateral)Motion is preserved.  Right knee flexion contracture 5-10 degrees  Symmetric normal motor tone is noted throughout. Normal muscle bulk. Muscle testing reveals 5/5 muscle strength of the upper extremity, and 5/5 of the lower extremity.  DTR in the upper and lower extremity are present and symmetric 2+. No clonus is noted.  Patient is in a wheel chair.        Assessment & Plan:  1. End-stage right knee osteoarthritis. Prescribed Voltaren gel, currently using lidoderm as well as knee sleeve, walker  2. Morbid obesity.  3. Generally deconditioned.  4. Chronic low back pain/spondylosis.  5. Chronic venous insufficiency/lymphedema. Leaking at left lower leg/ wrapped, treated by wound care center  6. SOB, O2 dependent  7. LBP, advised patient to ask his PCP, who prescribes him Robaxin 1 tablet at night, to change the dosage to 3 tablets a day, to help with his muscle spasms/pain in his lower back. He saw his PCP and he increased the Robaxin which has helped .  Continue following up with the wound care center for his open wound on the left lower leg. Advised patient to do exercises at home, in a sitting or recumbent position, which I showed him . Continue with his calf pumps.  He reports that he was admitted to the Hospital, [facility], for acute respiratory failure, he is on O2, 24/7 now.  Filled his medication today.   Follow up in 1 month.

## 2013-04-17 ENCOUNTER — Other Ambulatory Visit: Payer: Self-pay

## 2013-04-23 ENCOUNTER — Encounter: Payer: Self-pay | Admitting: Pulmonary Disease

## 2013-04-23 ENCOUNTER — Ambulatory Visit (INDEPENDENT_AMBULATORY_CARE_PROVIDER_SITE_OTHER): Payer: Medicare Other | Admitting: Pulmonary Disease

## 2013-04-23 VITALS — BP 128/62 | HR 101 | Temp 97.9°F | Wt 347.8 lb

## 2013-04-23 DIAGNOSIS — J9610 Chronic respiratory failure, unspecified whether with hypoxia or hypercapnia: Secondary | ICD-10-CM

## 2013-04-23 DIAGNOSIS — E662 Morbid (severe) obesity with alveolar hypoventilation: Secondary | ICD-10-CM

## 2013-04-23 NOTE — Patient Instructions (Signed)
Stay on your oxygen Work on weight loss Will try to get you a bipap device or IVAPS device.  followup with me in 6mos

## 2013-04-23 NOTE — Progress Notes (Signed)
  Subjective:    Patient ID: Blake Summers, male    DOB: [birth year], 67 y.o.   MRN: [id number]  HPI The patient comes in today for followup of his chronic respiratory failure associated with obesity hypoventilation syndrome.  He was recently in the hospital with acute decompensation, and had a pCO2 in the 90s.  He has been diuresed, and now is wearing oxygen more compliantly, and feels that he is slowly returning to baseline.  He is wearing CPAP compliantly, but it is obvious this is not adequately ventilating him.   Review of Systems  Constitutional: Negative for fever and unexpected weight change.  HENT: Negative for congestion, dental problem, ear pain, nosebleeds, postnasal drip, rhinorrhea, sinus pressure, sneezing, sore throat and trouble swallowing.   Eyes: Negative for redness and itching.  Respiratory: Negative for cough, chest tightness, shortness of breath and wheezing.   Cardiovascular: Negative for palpitations and leg swelling.  Gastrointestinal: Negative for nausea and vomiting.  Genitourinary: Negative for dysuria.  Musculoskeletal: Negative for joint swelling.  Skin: Negative for rash.  Neurological: Negative for headaches.  Hematological: Does not bruise/bleed easily.  Psychiatric/Behavioral: Negative for dysphoric mood. The patient is not nervous/anxious.        Objective:   Physical Exam Morbidly obese male in no acute distress Nose without purulence or discharge noted No skin breakdown or pressure necrosis from his CPAP mask Neck without lymphadenopathy or thyromegaly Chest with clear breath sounds, no wheezing Cardiac exam with regular rate and rhythm Lower extremities with 1+ edema bilaterally, no cyanosis Alert and oriented, moves all 4 extremities.       Assessment & Plan:

## 2013-04-23 NOTE — Assessment & Plan Note (Signed)
The patient has had a recent episode of acute on chronic respiratory failure, with very severe hypercarbia.  It is obvious that his current CPAP setup is not providing adequate ventilation for him.  He really needs to be on an IVAPS device, or at least a bilevel device.  If he does not get this, he is at increased risk for decompensation and hospitalization.  His DME has not cooperated with [place] in getting the equipment, and we may have to change.  Again, the patient has a poor prognosis if we do not adequately ventilate him.

## 2013-04-25 ENCOUNTER — Other Ambulatory Visit: Payer: Self-pay | Admitting: Pulmonary Disease

## 2013-04-25 ENCOUNTER — Telehealth: Payer: Self-pay | Admitting: Pulmonary Disease

## 2013-04-25 DIAGNOSIS — G4733 Obstructive sleep apnea (adult) (pediatric): Secondary | ICD-10-CM

## 2013-04-25 DIAGNOSIS — E662 Morbid (severe) obesity with alveolar hypoventilation: Secondary | ICD-10-CM

## 2013-04-25 NOTE — Telephone Encounter (Signed)
I spoke with Melissa. She reports pt does not qualify BIPAP ST only for BIPAP S. Per Efraim Kaufmann she is needing an order stating this and the pressure. Per Vickii Penna is aware of this as well. Please advise thanks

## 2013-04-25 NOTE — Telephone Encounter (Signed)
Rhonda sent me a note saying he qualified for both.  Check with her on Monday.

## 2013-04-28 NOTE — Telephone Encounter (Signed)
Blake Loser do you know anything about this pt? Thanks!

## 2013-04-28 NOTE — Telephone Encounter (Signed)
Per Melissa at [facility], after speaking with RT Team with [facility], they are processing this order under OSA diagnosis and failing CPAP machine. Melissa spoke with Edwyna Shell at [facility] and has offered two choices.  Pt can either have the BiPap S or the BiPap COPD device (which will allow for more adjustments). Pt would have to fail the Bipap S device (using device for 61 days) then could move to the Bipap ST device. Please advise. Rhonda J Cobb

## 2013-04-28 NOTE — Telephone Encounter (Signed)
LMOAM for Melissa to return my call. Rhonda J Cobb

## 2013-04-29 ENCOUNTER — Encounter: Payer: Self-pay | Admitting: Endocrinology

## 2013-04-29 ENCOUNTER — Ambulatory Visit (INDEPENDENT_AMBULATORY_CARE_PROVIDER_SITE_OTHER): Payer: Medicare Other | Admitting: Endocrinology

## 2013-04-29 VITALS — BP 130/68 | HR 100 | Temp 98.2°F | Resp 20

## 2013-04-29 DIAGNOSIS — E1065 Type 1 diabetes mellitus with hyperglycemia: Secondary | ICD-10-CM

## 2013-04-29 DIAGNOSIS — E119 Type 2 diabetes mellitus without complications: Secondary | ICD-10-CM

## 2013-04-29 DIAGNOSIS — Z23 Encounter for immunization: Secondary | ICD-10-CM

## 2013-04-29 DIAGNOSIS — E1049 Type 1 diabetes mellitus with other diabetic neurological complication: Secondary | ICD-10-CM

## 2013-04-29 NOTE — Progress Notes (Signed)
Subjective:    Patient ID: Blake Summers, male    DOB: 1945-10-16, 67 y.o.   MRN: [id number]  HPI Pt returns for f/u of insulin-requiring DM (dx'ed 1990; he has moderate neuropathy of the lower extremities; he has associated mild CAD, CVA, and foot ulcer; he has never had severe hypoglycemia or DKA).   no cbg record, but states cbg's are highest in am (mid-100's).   Past Medical History  Diagnosis Date  . GERD 07/06/2010  . Edema 10/28/2007  . DIABETES MELLITUS, TYPE II 05/04/2007  . HYPERLIPIDEMIA 07/08/2008  . ALLERGIC RHINITIS 07/08/2008  . GLAUCOMA 07/08/2008  . CEREBROVASCULAR ACCIDENT, HX OF 07/08/2008  . OBSTRUCTIVE SLEEP APNEA 03/06/2008  . DIABETIC ULCER, LEFT LEG 08/23/2009  . 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 HYPERTENSION 10/28/2007  . VENOUS INSUFFICIENCY 03/08/2009  . FATTY LIVER DISEASE 05/19/2008  . BENIGN PROSTATIC HYPERTROPHY 07/08/2008  . 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 NEPHROLITHIASIS, HX OF 07/08/2008  . Morbid obesity   . DM nephropathy/sclerosis   . 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 Lumbar spondylosis     Past Surgical History  Procedure Laterality Date  . Lithotripsy  2007    History   Social History  . Marital Status: Married    Spouse Name: N/A    Number of Children: N/A  . Years of Education: N/A   Occupational History  . Retired     Nature conservation officer   Social History Main Topics  . Smoking status: Former Smoker -- 0.50 packs/day for 15 years    Types: Cigarettes    Quit date: 06/12/1988  . Smokeless tobacco: Never Used  . Alcohol Use: No  . Drug Use: No  . Sexual Activity: Not on file   Other Topics Concern  . Not on file   Social History Narrative   Diet is "good"   Exercise is limited by medical problems    Current Outpatient Prescriptions on File Prior to Visit  Medication Sig Dispense Refill  . allopurinol (ZYLOPRIM) 300 MG tablet Take 1 tablet (300 mg total) by mouth 1 day or 1 dose.  30 tablet   3  . brimonidine (ALPHAGAN) 0.15 % ophthalmic solution as directed.      . clobetasol ointment (TEMOVATE) 0.05 % Apply topically 3 (three) times daily.  60 g  4  . DEXILANT 60 MG capsule TAKE 1 CAPSULE BY MOUTH DAILY  30 capsule  6  . diltiazem (CARDIZEM CD) 180 MG 24 hr capsule TAKE 2 CAPSULES BY MOUTH DAILY  60 capsule  3  . dipyridamole-aspirin (AGGRENOX) 200-25 MG per 12 hr capsule TAKE 1 CAPSULE BY MOUTH 2 TIMES         DAILY  60 capsule  3  . docusate sodium (STOOL SOFTENER) 100 MG capsule Take 1 capsule (100 mg total) by mouth 2 (two) times daily.  60 capsule  5  . dorzolamide-timolol (COSOPT) 22.3-6.8 MG/ML ophthalmic solution as directed.      . furosemide (LASIX) 80 MG tablet Take 1 tablet (80 mg total) by mouth daily.  30 tablet  4  . insulin aspart (NOVOLOG) 100 UNIT/ML injection 3x a day (just before each meal) 90-120-140 units      . Insulin Pen Needle (NOVOFINE) 30G X 8 MM MISC USE 4 TIMES A DAY.  200 each  6  . ketoconazole (NIZORAL) 2 % shampoo as directed.      . latanoprost (XALATAN) 0.005 % ophthalmic solution Place 1  drop into both eyes at bedtime.  2.5 mL  4  . lidocaine (LIDODERM) 5 % Place 1-3 patches onto the skin daily. Remove & Discard patch within 12 hours or as directed by MD  90 patch  2  . loratadine-pseudoephedrine (CLARITIN-D 12 HOUR) 5-120 MG per tablet Take 1 tablet by mouth 2 (two) times daily.  60 tablet  5  . losartan-hydrochlorothiazide (HYZAAR) 100-25 MG per tablet Take 1 tablet by mouth daily.  30 tablet  5  . lovastatin (MEVACOR) 40 MG tablet Take 1 tablet (40 mg total) by mouth at bedtime.  60 tablet  3  . methocarbamol (ROBAXIN) 500 MG tablet Take 1 tablet (500 mg total) by mouth 3 (three) times daily. As needed for cramps.  90 tablet  11  . morphine (MS CONTIN) 15 MG 12 hr tablet Take 1 tablet (15 mg total) by mouth 2 (two) times daily.  60 tablet  0  . naproxen sodium (ANAPROX) 220 MG tablet 2 tablets by mouth once daily as needed for pain       .  oxyCODONE-acetaminophen (PERCOCET) 7.5-500 MG per tablet Take 1 tablet by mouth every 8 (eight) hours as needed for pain. 1 tablet by mouth three times a day as needed  90 tablet  0  . phenol (CHLORASEPTIC) 1.4 % LIQD Use as directed 1 spray in the mouth or throat as needed.  118 mL  0  . polyethylene glycol powder (GLYCOLAX/MIRALAX) powder DISSOLVE ONE CAPFUL ( 17GM) IN 8 TO 10 OUNCES OF WATER DAILY  527 g  6  . potassium chloride SA (KLOR-CON M20) 20 MEQ tablet Take 1 tablet (20 mEq total) by mouth 2 (two) times daily.  60 tablet  11  . PROVENTIL HFA 108 (90 BASE) MCG/ACT inhaler ONE PUFF EVERY 4 HOURS AS NEEDED  6.7 g  5  . ranitidine (ZANTAC) 150 MG tablet 2  At bedtime  60 tablet  5  . testosterone (ANDROGEL) 50 MG/5GM GEL 3 tubes daily  90 Tube  5  . topiramate (TOPAMAX) 50 MG tablet TAKE 1 TABLET BY MOUTH 2 TIMES A DAY  60 tablet  3  . VOLTAREN 1 % GEL APPLY TOPICALLY 4 TIMES DAILY AS PHYSICIAN INSTRUCTED  200 g  3  . zolpidem (AMBIEN) 10 MG tablet Take 1 tablet (10 mg total) by mouth at bedtime as needed.  30 tablet  5   No current facility-administered medications on file prior to visit.    Allergies  Allergen Reactions  . Neosporin [Neomycin-Polymyxin-Gramicidin]     Family History  Problem Relation Age of Onset  . Cancer Brother     Colon Cancer  . Heart disease Brother   . Heart disease Brother   . Asthma Mother    BP 130/68  Pulse 100  Temp(Src) 98.2 F (36.8 C) (Oral)  Resp 20  Review of Systems denies hypoglycemia and weight change    Objective:   Physical Exam VITAL SIGNS:  See vs page GENERAL: no distress   Lab Results  Component Value Date   HGBA1C 7.4* 04/29/2013      Assessment & Plan:  DM: This insulin regimen was chosen from multiple options, as it best matches his insulin to his changing requirements throughout the day.  The benefits of glycemic control must be weighed against the risks of hypoglycemia.  He needs increased rx Morbid obesity:  this complicates various med problems he has.

## 2013-04-29 NOTE — Telephone Encounter (Signed)
Can't have vpap copd because he does not have copd.  Makes no sense Therefore, will start on bipap at 14/10, and have him f/u with me in 8 weeks.  If he fails, then can get bipap st.  Ivaps is what he needs, but insurance will not allow this.

## 2013-04-29 NOTE — Telephone Encounter (Signed)
Order placed to [facility].  

## 2013-04-29 NOTE — Patient Instructions (Addendum)
check your blood sugar twice a day.  vary the time of day when you check, between before the 3 meals, and at bedtime.  also check if you have symptoms of your blood sugar being too high or too low.  please keep a record of the readings and bring it to your next appointment here.  please call us sooner if your blood sugar goes below 70, or if you have a lot of readings over 200.    Please come back for a follow-up appointment in 3 months.  blood tests are being requested for you today.  We'll contact you with results.     

## 2013-04-29 NOTE — Telephone Encounter (Signed)
Can you place me an order for a Bipap S set on 14/10 and send to [facility]. Thanks, Ollen Gross

## 2013-04-29 NOTE — Telephone Encounter (Signed)
Order has been sent to [facility] by staff message to [employer] for Bipap S set on 14/10. Contacted patient and he is aware that this has been approved and [facility] will contact him to arrange delivery of bipap. Appointment scheduled for patient for 06/25/13 at 3:30 with Dr. Shelle Iron and pt is aware of this appointment. Pt advised to call us if he has problems tolerating the machine or has any issues with the device.  Pt is asking if he can have the nasal pillows with this device? Please advise. Rhonda J Cobb

## 2013-05-02 ENCOUNTER — Other Ambulatory Visit: Payer: Self-pay | Admitting: Pulmonary Disease

## 2013-05-02 ENCOUNTER — Other Ambulatory Visit: Payer: Self-pay | Admitting: *Deleted

## 2013-05-02 MED ORDER — DILTIAZEM HCL ER COATED BEADS 180 MG PO CP24: ORAL_CAPSULE | ORAL | Status: DC

## 2013-05-02 MED ORDER — FUROSEMIDE 80 MG PO TABS: 80.0000 mg | ORAL_TABLET | Freq: Every day | ORAL | Status: DC

## 2013-05-02 MED ORDER — ASPIRIN-DIPYRIDAMOLE ER 25-200 MG PO CP12: ORAL_CAPSULE | ORAL | Status: DC

## 2013-05-02 MED ORDER — DEXLANSOPRAZOLE 60 MG PO CPDR: DELAYED_RELEASE_CAPSULE | ORAL | Status: DC

## 2013-05-13 ENCOUNTER — Telehealth: Payer: Self-pay

## 2013-05-13 MED ORDER — BAYER CONTOUR NEXT MONITOR W/DEVICE KIT: 1.0000 | PACK | Freq: Once | Status: DC

## 2013-05-13 MED ORDER — GLUCOSE BLOOD VI STRP: 1.0000 | ORAL_STRIP | Freq: Two times a day (BID) | Status: DC

## 2013-05-13 NOTE — Telephone Encounter (Signed)
rxs sent

## 2013-05-13 NOTE — Telephone Encounter (Signed)
Patient called stating that the Conture Next by [employer] meter would be covered by insurance. Requesting strips and lancets prescription.  Thanks!

## 2013-05-15 ENCOUNTER — Encounter
Payer: Medicare Other | Attending: Physical Medicine and Rehabilitation | Admitting: Physical Medicine and Rehabilitation

## 2013-05-15 ENCOUNTER — Ambulatory Visit: Payer: Medicare Other | Admitting: Physical Medicine and Rehabilitation

## 2013-05-15 ENCOUNTER — Encounter: Payer: Self-pay | Admitting: Physical Medicine and Rehabilitation

## 2013-05-15 VITALS — BP 155/48 | HR 86 | Resp 16 | Ht 67.0 in | Wt 347.0 lb

## 2013-05-15 DIAGNOSIS — G8929 Other chronic pain: Secondary | ICD-10-CM | POA: Insufficient documentation

## 2013-05-15 DIAGNOSIS — M47817 Spondylosis without myelopathy or radiculopathy, lumbosacral region: Secondary | ICD-10-CM | POA: Insufficient documentation

## 2013-05-15 DIAGNOSIS — Z79899 Other long term (current) drug therapy: Secondary | ICD-10-CM

## 2013-05-15 DIAGNOSIS — M1710 Unilateral primary osteoarthritis, unspecified knee: Secondary | ICD-10-CM

## 2013-05-15 DIAGNOSIS — Z5181 Encounter for therapeutic drug level monitoring: Secondary | ICD-10-CM

## 2013-05-15 DIAGNOSIS — R5381 Other malaise: Secondary | ICD-10-CM | POA: Insufficient documentation

## 2013-05-15 MED ORDER — LIDOCAINE 5 % EX PTCH: 1.0000 | MEDICATED_PATCH | CUTANEOUS | Status: DC

## 2013-05-15 MED ORDER — MORPHINE SULFATE ER 15 MG PO TBCR: 15.0000 mg | EXTENDED_RELEASE_TABLET | Freq: Two times a day (BID) | ORAL | Status: DC

## 2013-05-15 MED ORDER — OXYCODONE-ACETAMINOPHEN 7.5-500 MG PO TABS: 1.0000 | ORAL_TABLET | Freq: Three times a day (TID) | ORAL | Status: DC | PRN

## 2013-05-15 NOTE — Patient Instructions (Signed)
1. Increase Miralax (polyethylene glycol) twice a day. 2.  Increase fiber and water in diet.

## 2013-05-15 NOTE — Progress Notes (Signed)
Subjective:    Patient ID: Blake Summers, male    DOB: [birth year], 67 y.o.   MRN: [id number]  HPI Blake Summers is here for follow up on his chronic right knee and chronic back pain. He is now on oxygen 24/7 and reports that DOE as well as pain are a limiting factor in his activity. He now has an ulcer on RLE which is also managed at wound center. Dr. Everardo All has increased his NPH and this is improving his BS control. Neuropathy is unchanged.    Pain Inventory Average Pain 7 Pain Right Now 7 My pain is sharp, burning, stabbing and aching  In the last 24 hours, has pain interfered with the following? General activity 8 Relation with others 9 Enjoyment of life 9 What TIME of day is your pain at its worst? daytime Sleep (in general) Poor  Pain is worse with: walking and standing Pain improves with: rest, heat/ice, medication and injections Relief from Meds: 8  Mobility walk with assistance use a cane use a walker ability to climb steps?  no do you drive?  yes use a wheelchair needs help with transfers  Function retired I need assistance with the following:  dressing, meal prep, household duties and shopping  Neuro/Psych numbness tingling trouble walking dizziness  Prior Studies Any changes since last visit?  no  Physicians involved in your care Any changes since last visit?  no   Family History  Problem Relation Age of Onset  . Cancer Brother     Colon Cancer  . Heart disease Brother   . Heart disease Brother   . Asthma Mother    History   Social History  . Marital Status: Married    Spouse Name: N/A    Number of Children: N/A  . Years of Education: N/A   Occupational History  . Retired     Nature conservation officer   Social History Main Topics  . Smoking status: Former Smoker -- 0.50 packs/day for 15 years    Types: Cigarettes    Quit date: 06/12/1988  . Smokeless tobacco: Never Used  . Alcohol Use: No  . Drug Use: No  . Sexual Activity: None    Other Topics Concern  . None   Social History Narrative   Diet is "good"   Exercise is limited by medical problems   Past Surgical History  Procedure Laterality Date  . Lithotripsy  2007   Past Medical History  Diagnosis Date  . GERD 07/06/2010  . Edema 10/28/2007  . DIABETES MELLITUS, TYPE II 05/04/2007  . HYPERLIPIDEMIA 07/08/2008  . ALLERGIC RHINITIS 07/08/2008  . GLAUCOMA 07/08/2008  . CEREBROVASCULAR ACCIDENT, HX OF 07/08/2008  . OBSTRUCTIVE SLEEP APNEA 03/06/2008  . DIABETIC ULCER, LEFT LEG 08/23/2009  . 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 HYPERTENSION 10/28/2007  . VENOUS INSUFFICIENCY 03/08/2009  . FATTY LIVER DISEASE 05/19/2008  . BENIGN PROSTATIC HYPERTROPHY 07/08/2008  . 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 NEPHROLITHIASIS, HX OF 07/08/2008  . Morbid obesity   . DM nephropathy/sclerosis   . 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 Lumbar spondylosis    BP 155/48  Pulse 86  Resp 16  Ht 5\' 7"  (1.702 m)  Wt 347 lb (157.398 kg)  BMI 54.34 kg/m2  SpO2 92%   Review of Systems  Musculoskeletal: Positive for gait problem.  Skin: Positive for wound (new ulcer on right shin).  Neurological: Positive for dizziness and numbness.  Bladder control problems  All other systems reviewed and are negative.       Objective:   Physical Exam  Nursing note and vitals reviewed. Constitutional: He is oriented to person, place, and time. He appears well-developed and well-nourished. He is cooperative. Nasal cannula in place.  Morbidly obese male sitting in wheelchair. Well dressed and appropriate  HENT:  Head: Normocephalic and atraumatic.  Eyes: Conjunctivae are normal. Pupils are equal, round, and reactive to light.  Neck: Normal range of motion.  Cardiovascular: Normal rate and regular rhythm.   Pulmonary/Chest: Effort normal and breath sounds normal.  Abdominal: Soft. He exhibits no distension. There is no tenderness.  Musculoskeletal: He  exhibits edema (pedally).  Unna boots BLE to knees. Right knee flexion contracture 5-10 degrees with extensor lag. Sensation--not tested due to [employer] in place.  Symmetric normal motor tone is noted throughout. Normal muscle bulk. Muscle testing reveals 5/5 muscle strength of the upper extremity, and 4/5 of the lower extremity--limited ROM right knee.  Neurological: He is alert and oriented to person, place, and time.  Skin: Skin is warm and dry.  Psychiatric: He has a normal mood and affect. His speech is normal and behavior is normal. Thought content normal.          Assessment & Plan:  1.  End-stage right knee osteoarthritis: Currently using lidoderm as well as knee sleeve, walker  2. Morbid obesity.  3. Generally deconditioned: unable to do any water therapy till wounds on legs resolve.  4. Chronic low back pain/spondylosis: He is using robaxin 2-3 times a day to augment his pain regimen. Continue MS contin. Advised that improvement in BS control will help in control of neuropathy as well as wound healing.  5. Narcotic induced constipation: Increase miralax to twice a day. Add additional fiber to diet and increase water intake.

## 2013-05-29 ENCOUNTER — Other Ambulatory Visit: Payer: Self-pay | Admitting: Endocrinology

## 2013-06-02 ENCOUNTER — Other Ambulatory Visit: Payer: Self-pay | Admitting: Endocrinology

## 2013-06-03 ENCOUNTER — Ambulatory Visit (INDEPENDENT_AMBULATORY_CARE_PROVIDER_SITE_OTHER): Payer: Medicare Other | Admitting: Family Medicine

## 2013-06-03 ENCOUNTER — Encounter: Payer: Self-pay | Admitting: Family Medicine

## 2013-06-03 VITALS — BP 104/68 | HR 92 | Temp 97.8°F | Ht 67.0 in | Wt 324.0 lb

## 2013-06-03 DIAGNOSIS — I10 Essential (primary) hypertension: Secondary | ICD-10-CM

## 2013-06-03 DIAGNOSIS — E876 Hypokalemia: Secondary | ICD-10-CM

## 2013-06-03 DIAGNOSIS — R109 Unspecified abdominal pain: Secondary | ICD-10-CM

## 2013-06-03 DIAGNOSIS — R197 Diarrhea, unspecified: Secondary | ICD-10-CM

## 2013-06-03 NOTE — Progress Notes (Signed)
Pre visit review using our clinic review tool, if applicable. No additional management support is needed unless otherwise documented below in the visit note. 

## 2013-06-03 NOTE — Patient Instructions (Signed)
Clear fluids x 24 hours to rest the gut with Gatorade, sprite, water etc Then BRAT diet (bananas, rice, applesauce and toast) No Glycolax, no stool softener  Add a probiotic such as Digestive Advantage    Diarrhea Diarrhea is frequent loose and watery bowel movements. It can cause you to feel weak and dehydrated. Dehydration can cause you to become tired and thirsty, have a dry mouth, and have decreased urination that often is dark yellow. Diarrhea is a sign of another problem, most often an infection that will not last long. In most cases, diarrhea typically lasts 2 3 days. However, it can last longer if it is a sign of something more serious. It is important to treat your diarrhea as directed by your caregive to lessen or prevent future episodes of diarrhea. CAUSES  Some common causes include:  Gastrointestinal infections caused by viruses, bacteria, or parasites.  Food poisoning or food allergies.  Certain medicines, such as antibiotics, chemotherapy, and laxatives.  Artificial sweeteners and fructose.  Digestive disorders. HOME CARE INSTRUCTIONS  Ensure adequate fluid intake (hydration): have 1 cup (8 oz) of fluid for each diarrhea episode. Avoid fluids that contain simple sugars or sports drinks, fruit juices, whole milk products, and sodas. Your urine should be clear or pale yellow if you are drinking enough fluids. Hydrate with an oral rehydration solution that you can purchase at pharmacies, retail stores, and online. You can prepare an oral rehydration solution at home by mixing the following ingredients together:    tsp table salt.   tsp baking soda.   tsp salt substitute containing potassium chloride.  1  tablespoons sugar.  1 L (34 oz) of water.  Certain foods and beverages may increase the speed at which food moves through the gastrointestinal (GI) tract. These foods and beverages should be avoided and include:  Caffeinated and alcoholic beverages.  High-fiber  foods, such as raw fruits and vegetables, nuts, seeds, and whole grain breads and cereals.  Foods and beverages sweetened with sugar alcohols, such as xylitol, sorbitol, and mannitol.  Some foods may be well tolerated and may help thicken stool including:  Starchy foods, such as rice, toast, pasta, low-sugar cereal, oatmeal, grits, baked potatoes, crackers, and bagels.  Bananas.  Applesauce.  Add probiotic-rich foods to help increase healthy bacteria in the GI tract, such as yogurt and fermented milk products.  Wash your hands well after each diarrhea episode.  Only take over-the-counter or prescription medicines as directed by your caregiver.  Take a warm bath to relieve any burning or pain from frequent diarrhea episodes. SEEK IMMEDIATE MEDICAL CARE IF:   You are unable to keep fluids down.  You have persistent vomiting.  You have blood in your stool, or your stools are black and tarry.  You do not urinate in 6 8 hours, or there is only a small amount of very dark urine.  You have abdominal pain that increases or localizes.  You have weakness, dizziness, confusion, or lightheadedness.  You have a severe headache.  Your diarrhea gets worse or does not get better.  You have a fever or persistent symptoms for more than 2 3 days.  You have a fever and your symptoms suddenly get worse. MAKE SURE YOU:   Understand these instructions.  Will watch your condition.  Will get help right away if you are not doing well or get worse. Document Released: 05/19/2002 Document Revised: 05/15/2012 Document Reviewed: 02/04/2012 [facility] Patient Information 2014 [place], [place].

## 2013-06-06 LAB — CBC
HCT: 39.3 % (ref 39.0–52.0)
MCH: 29.9 pg (ref 26.0–34.0)
MCV: 86.9 fL (ref 78.0–100.0)
RBC: 4.52 MIL/uL (ref 4.22–5.81)
RDW: 15.8 % — ABNORMAL HIGH (ref 11.5–15.5)
WBC: 12.2 10*3/uL — ABNORMAL HIGH (ref 4.0–10.5)

## 2013-06-07 LAB — RENAL FUNCTION PANEL
Albumin: 3.7 g/dL (ref 3.5–5.2)
BUN: 12 mg/dL (ref 6–23)
CO2: 34 mEq/L — ABNORMAL HIGH (ref 19–32)
Chloride: 89 mEq/L — ABNORMAL LOW (ref 96–112)
Creat: 0.93 mg/dL (ref 0.50–1.35)
Phosphorus: 4.1 mg/dL (ref 2.3–4.6)

## 2013-06-07 LAB — HEPATIC FUNCTION PANEL
Albumin: 3.7 g/dL (ref 3.5–5.2)
Total Bilirubin: 0.5 mg/dL (ref 0.3–1.2)
Total Protein: 6.3 g/dL (ref 6.0–8.3)

## 2013-06-08 ENCOUNTER — Encounter: Payer: Self-pay | Admitting: Family Medicine

## 2013-06-08 DIAGNOSIS — R197 Diarrhea, unspecified: Secondary | ICD-10-CM

## 2013-06-08 HISTORY — DX: Diarrhea, unspecified: R19.7

## 2013-06-08 NOTE — Assessment & Plan Note (Signed)
Clear fluids, BRAT diet, increase hydration and K in diet. Add probiotics and check stool cultures. Seek care if symptoms worsen

## 2013-06-08 NOTE — Assessment & Plan Note (Signed)
Well controlled, no changes 

## 2013-06-08 NOTE — Assessment & Plan Note (Signed)
Mild, increase potassium intake 

## 2013-06-08 NOTE — Progress Notes (Signed)
Patient ID: Blake Summers, male   DOB: August 19, 1945, 67 y.o.   MRN: [id number] Blake Summers [id number] 1945/11/10 06/08/2013      Progress Note-Follow Up  Subjective  Chief Complaint  Chief Complaint  Patient presents with  . Diarrhea    X 7 days, 3-4 times a day    HPI  Patient is a 67 year old male who is in today complaining of roughly 7 days worth of diarrhea. He reports having 3-4 loose stool daily. Frequent abdominal cramps but no blood or mucus is noted. He reports chills but no fevers. He is struggling with anorexia and nausea but no vomiting. No chest pain or palpitations no shortness of breath or syncope. Has not tried any over-the-counter medications thus far  Past Medical History  Diagnosis Date  . GERD 07/06/2010  . Edema 10/28/2007  . DIABETES MELLITUS, TYPE II 05/04/2007  . HYPERLIPIDEMIA 07/08/2008  . ALLERGIC RHINITIS 07/08/2008  . GLAUCOMA 07/08/2008  . CEREBROVASCULAR ACCIDENT, HX OF 07/08/2008  . OBSTRUCTIVE SLEEP APNEA 03/06/2008  . DIABETIC ULCER, LEFT LEG 08/23/2009  . 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 HYPERTENSION 10/28/2007  . VENOUS INSUFFICIENCY 03/08/2009  . FATTY LIVER DISEASE 05/19/2008  . BENIGN PROSTATIC HYPERTROPHY 07/08/2008  . 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 NEPHROLITHIASIS, HX OF 07/08/2008  . Morbid obesity   . DM nephropathy/sclerosis   . 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 Lumbar spondylosis   . Diarrhea 06/08/2013    Past Surgical History  Procedure Laterality Date  . Lithotripsy  2007    Family History  Problem Relation Age of Onset  . Cancer Brother     Colon Cancer  . Heart disease Brother   . Heart disease Brother   . Asthma Mother     History   Social History  . Marital Status: Married    Spouse Name: N/A    Number of Children: N/A  . Years of Education: N/A   Occupational History  . Retired     Nature conservation officer   Social History Main Topics  . Smoking status: Former Smoker --  0.50 packs/day for 15 years    Types: Cigarettes    Quit date: 06/12/1988  . Smokeless tobacco: Never Used  . Alcohol Use: No  . Drug Use: No  . Sexual Activity: Not on file   Other Topics Concern  . Not on file   Social History Narrative   Diet is "good"   Exercise is limited by medical problems    Current Outpatient Prescriptions on File Prior to Visit  Medication Sig Dispense Refill  . allopurinol (ZYLOPRIM) 300 MG tablet Take 1 tablet (300 mg total) by mouth 1 day or 1 dose.  30 tablet  3  . Blood Glucose Monitoring Suppl (BAYER CONTOUR NEXT MONITOR) W/DEVICE KIT 1 Device by Does not apply route once.  1 kit  0  . brimonidine (ALPHAGAN) 0.15 % ophthalmic solution as directed.      Marland Kitchen CLARITIN-D 12 HOUR 5-120 MG per tablet TAKE 1 TABLET BY MOUTH TWICE DAILY  60 tablet  2  . clobetasol ointment (TEMOVATE) 0.05 % Apply topically 3 (three) times daily.  60 g  4  . dexlansoprazole (DEXILANT) 60 MG capsule TAKE 1 CAPSULE BY MOUTH DAILY  30 capsule  6  . diltiazem (CARDIZEM CD) 180 MG 24 hr capsule TAKE 2 CAPSULES BY MOUTH DAILY  60 capsule  3  . dipyridamole-aspirin (AGGRENOX) 200-25 MG per 12 hr capsule  TAKE 1 CAPSULE BY MOUTH 2 TIMES         DAILY  60 capsule  3  . docusate sodium (STOOL SOFTENER) 100 MG capsule Take 1 capsule (100 mg total) by mouth 2 (two) times daily.  60 capsule  5  . dorzolamide-timolol (COSOPT) 22.3-6.8 MG/ML ophthalmic solution as directed.      . furosemide (LASIX) 80 MG tablet Take 1 tablet (80 mg total) by mouth daily.  30 tablet  4  . glucose blood (BAYER CONTOUR NEXT TEST) test strip 1 each by Other route 2 (two) times daily. And lancets 250.01  100 each  12  . HUMULIN N KWIKPEN 100 UNIT/ML SUPN INJECT 80 UNITS SUB-Q EACH NIGHT AT BEDTIME  30 mL  3  . insulin aspart (NOVOLOG) 100 UNIT/ML injection 3x a day (just before each meal) 90-120-140 units      . insulin NPH (HUMULIN N,NOVOLIN N) 100 UNIT/ML injection Inject 80 Units into the skin at bedtime.      Marland Kitchen  ketoconazole (NIZORAL) 2 % shampoo as directed.      . latanoprost (XALATAN) 0.005 % ophthalmic solution Place 1 drop into both eyes at bedtime.  2.5 mL  4  . lidocaine (LIDODERM) 5 % Place 1-3 patches onto the skin daily. Remove & Discard patch within 12 hours or as directed by MD  90 patch  2  . losartan-hydrochlorothiazide (HYZAAR) 100-25 MG per tablet Take 1 tablet by mouth daily.  30 tablet  5  . lovastatin (MEVACOR) 40 MG tablet Take 1 tablet (40 mg total) by mouth at bedtime.  60 tablet  3  . metFORMIN (GLUCOPHAGE-XR) 500 MG 24 hr tablet       . methocarbamol (ROBAXIN) 500 MG tablet Take 1 tablet (500 mg total) by mouth 3 (three) times daily. As needed for cramps.  90 tablet  11  . MICROLET LANCETS MISC       . morphine (MS CONTIN) 15 MG 12 hr tablet Take 1 tablet (15 mg total) by mouth 2 (two) times daily.  60 tablet  0  . naproxen sodium (ANAPROX) 220 MG tablet 2 tablets by mouth once daily as needed for pain       . NOVOFINE 30G X 8 MM MISC USE 4 TIMES A DAY  100 each  3  . oxyCODONE-acetaminophen (PERCOCET) 7.5-500 MG per tablet Take 1 tablet by mouth every 8 (eight) hours as needed for pain. 1 tablet by mouth three times a day as needed  90 tablet  0  . phenol (CHLORASEPTIC) 1.4 % LIQD Use as directed 1 spray in the mouth or throat as needed.  118 mL  0  . polyethylene glycol powder (GLYCOLAX/MIRALAX) powder DISSOLVE ONE CAPFUL ( 17GM) IN 8 TO 10 OUNCES OF WATER DAILY  527 g  6  . potassium chloride SA (KLOR-CON M20) 20 MEQ tablet Take 1 tablet (20 mEq total) by mouth 2 (two) times daily.  60 tablet  11  . PROVENTIL HFA 108 (90 BASE) MCG/ACT inhaler ONE PUFF EVERY 4 HOURS AS NEEDED  6.7 g  5  . ranitidine (ZANTAC) 150 MG tablet 2  At bedtime  60 tablet  5  . testosterone (ANDROGEL) 50 MG/5GM GEL 3 tubes daily  90 Tube  5  . topiramate (TOPAMAX) 50 MG tablet TAKE 1 TABLET BY MOUTH 2 TIMES A DAY  60 tablet  3  . VOLTAREN 1 % GEL APPLY TOPICALLY 4 TIMES DAILY AS PHYSICIAN INSTRUCTED  200  g  3  . zolpidem (AMBIEN) 10 MG tablet Take 1 tablet (10 mg total) by mouth at bedtime as needed.  30 tablet  5   No current facility-administered medications on file prior to visit.    Allergies  Allergen Reactions  . Neosporin [Neomycin-Polymyxin-Gramicidin]     Review of Systems  Review of Systems  Constitutional: Positive for chills and malaise/fatigue. Negative for fever.  HENT: Negative for congestion.   Eyes: Negative for discharge.  Respiratory: Negative for shortness of breath.   Cardiovascular: Negative for chest pain, palpitations and leg swelling.  Gastrointestinal: Positive for nausea, abdominal pain and diarrhea. Negative for vomiting.  Genitourinary: Negative for dysuria.  Musculoskeletal: Negative for falls.  Skin: Negative for rash.  Neurological: Negative for loss of consciousness and headaches.  Endo/Heme/Allergies: Negative for polydipsia.  Psychiatric/Behavioral: Negative for depression and suicidal ideas. The patient is not nervous/anxious and does not have insomnia.     Objective  BP 104/68  Pulse 92  Temp(Src) 97.8 F (36.6 C) (Oral)  Ht 5\' 7"  (1.702 m)  Wt 324 lb 0.6 oz (146.984 kg)  BMI 50.74 kg/m2  SpO2 94%  Physical Exam  Physical Exam  Constitutional: He is oriented to person, place, and time and well-developed, well-nourished, and in no distress. No distress.  HENT:  Head: Normocephalic and atraumatic.  Eyes: Conjunctivae are normal.  Neck: Neck supple. No thyromegaly present.  Cardiovascular: Normal rate, regular rhythm and normal heart sounds.   No murmur heard. Pulmonary/Chest: Effort normal and breath sounds normal. No respiratory distress.  Abdominal: He exhibits no distension and no mass. There is no tenderness.  Musculoskeletal: He exhibits no edema.  Neurological: He is alert and oriented to person, place, and time.  Skin: Skin is warm.  Psychiatric: Memory, affect and judgment normal.    Lab Results  Component Value Date    TSH 1.16 10/04/2012   Lab Results  Component Value Date   WBC 12.2* 06/06/2013   HGB 13.5 06/06/2013   HCT 39.3 06/06/2013   MCV 86.9 06/06/2013   PLT 331 06/06/2013   Lab Results  Component Value Date   CREATININE 0.93 06/06/2013   BUN 12 06/06/2013   NA 135 06/06/2013   K 3.3* 06/06/2013   CL 89* 06/06/2013   CO2 34* 06/06/2013   Lab Results  Component Value Date   ALT 23 06/06/2013   AST 18 06/06/2013   ALKPHOS 96 06/06/2013   BILITOT 0.5 06/06/2013   Lab Results  Component Value Date   CHOL 134 10/04/2012   Lab Results  Component Value Date   HDL 30.00* 10/04/2012   Lab Results  Component Value Date   LDLCALC 43 07/28/2011   Lab Results  Component Value Date   TRIG 287.0* 10/04/2012   Lab Results  Component Value Date   CHOLHDL 4 10/04/2012     Assessment & Plan  HYPERTENSION Well controlled, no changes  HYPOKALEMIA Mild, increase potassium intake.  Diarrhea Clear fluids, BRAT diet, increase hydration and K in diet. Add probiotics and check stool cultures. Seek care if symptoms worsen

## 2013-06-09 ENCOUNTER — Other Ambulatory Visit: Payer: Self-pay | Admitting: Family Medicine

## 2013-06-10 LAB — OVA AND PARASITE EXAMINATION: OP: NONE SEEN

## 2013-06-11 LAB — CLOSTRIDIUM DIFFICILE BY PCR: Toxigenic C. Difficile by PCR: NOT DETECTED

## 2013-06-11 LAB — HM DIABETES EYE EXAM

## 2013-06-13 LAB — STOOL CULTURE

## 2013-06-16 ENCOUNTER — Telehealth: Payer: Self-pay

## 2013-06-16 MED ORDER — FLUCONAZOLE 150 MG PO TABS: 150.0000 mg | ORAL_TABLET | ORAL | Status: DC

## 2013-06-16 NOTE — Telephone Encounter (Signed)
Please advise? I don't see where a medication was supposed to be sent?

## 2013-06-16 NOTE — Telephone Encounter (Signed)
Patient informed. 

## 2013-06-16 NOTE — Addendum Note (Signed)
Addended by: Court JoyFREEMAN, Narada Uzzle L on: 06/16/2013 11:52 AM   Modules accepted: Orders

## 2013-06-16 NOTE — Telephone Encounter (Signed)
Look at previous note.

## 2013-06-16 NOTE — Telephone Encounter (Signed)
Patient states he was called last Friday by a nurse other than Dorchesterhristy.  She told him that an RX had been called in to his pharmacy.  To stop taking the statin and take this med for a couple of weeks.  There is no rx at his pharmacy [facility].  Was this nurse correct  Was he supposed to have an rx

## 2013-06-17 ENCOUNTER — Telehealth: Payer: Self-pay | Admitting: Family Medicine

## 2013-06-17 ENCOUNTER — Encounter: Payer: Self-pay | Admitting: Endocrinology

## 2013-06-17 NOTE — Telephone Encounter (Signed)
Patient was called yesterday and told an rx was at his pharmacy  The pharmacy does not have anything.  He is confused please call the patient and advise what he is to do

## 2013-06-17 NOTE — Telephone Encounter (Signed)
RX was sent this morning. The RX printed for some reason yesterday

## 2013-06-25 ENCOUNTER — Encounter: Payer: Self-pay | Admitting: Pulmonary Disease

## 2013-06-25 ENCOUNTER — Ambulatory Visit (INDEPENDENT_AMBULATORY_CARE_PROVIDER_SITE_OTHER): Payer: Medicare Other | Admitting: Pulmonary Disease

## 2013-06-25 VITALS — BP 130/62 | HR 89 | Temp 97.3°F | Ht 66.0 in | Wt 337.0 lb

## 2013-06-25 DIAGNOSIS — E662 Morbid (severe) obesity with alveolar hypoventilation: Secondary | ICD-10-CM

## 2013-06-25 NOTE — Assessment & Plan Note (Signed)
The patient is doing. Well on bilevel and oxygen at this time. His edema has decreased, and his weight is down 10 pounds. I've asked him to continue on his device, and to keep working aggressively on weight loss.

## 2013-06-25 NOTE — Patient Instructions (Signed)
Continue with your bipap and oxygen Will call [facility] and see if they can send us a copy of your recent download. Keep working on weight loss followup with me in 6mos if doing well.

## 2013-06-25 NOTE — Progress Notes (Signed)
   Subjective:    Patient ID: Blake Summers, male    DOB: 1946-06-07, 68 y.o.   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 He has done much better since being on BiPAP, and feels that he is sleeping well with the device and no issues with his mask. He feels that he is breathing better, and his fluid balance has been easier to control. He is actually lost 10 pounds since last visit.   Review of Systems  Constitutional: Negative for fever and unexpected weight change.  HENT: Negative for congestion, dental problem, ear pain, nosebleeds, postnasal drip, rhinorrhea, sinus pressure, sneezing, sore throat and trouble swallowing.   Eyes: Negative for redness and itching.  Respiratory: Negative for cough, chest tightness, shortness of breath and wheezing.   Cardiovascular: Negative for palpitations and leg swelling.  Gastrointestinal: Negative for nausea and vomiting.  Genitourinary: Negative for dysuria.  Musculoskeletal: Negative for joint swelling.  Skin: Negative for rash.  Neurological: Negative for headaches.  Hematological: Does not bruise/bleed easily.  Psychiatric/Behavioral: Negative for dysphoric mood. The patient is not nervous/anxious.        Objective:   Physical Exam Morbidly obese male in no acute distress Nose without purulence or discharge noted No skin breakdown or pressure necrosis from the CPAP mask Neck without lymphadenopathy or thyromegaly Chest with mildly decreased breath sounds, no wheezes or crackles Heart exam with regular rate and rhythm Lower extremities with 2+ edema, wraps in place, no cyanosis Alert and oriented, does not appear to be sleepy, moves all 4 extremities.        Assessment & Plan:

## 2013-07-03 ENCOUNTER — Other Ambulatory Visit: Payer: Self-pay | Admitting: Endocrinology

## 2013-07-03 ENCOUNTER — Encounter
Payer: Medicare Other | Attending: Physical Medicine and Rehabilitation | Admitting: Physical Medicine and Rehabilitation

## 2013-07-03 ENCOUNTER — Encounter: Payer: Self-pay | Admitting: Physical Medicine and Rehabilitation

## 2013-07-03 VITALS — BP 147/67 | HR 105 | Resp 14 | Ht 66.0 in | Wt 337.0 lb

## 2013-07-03 DIAGNOSIS — E785 Hyperlipidemia, unspecified: Secondary | ICD-10-CM | POA: Insufficient documentation

## 2013-07-03 DIAGNOSIS — M6250 Muscle wasting and atrophy, not elsewhere classified, unspecified site: Secondary | ICD-10-CM | POA: Insufficient documentation

## 2013-07-03 DIAGNOSIS — I10 Essential (primary) hypertension: Secondary | ICD-10-CM | POA: Insufficient documentation

## 2013-07-03 DIAGNOSIS — Z8673 Personal history of transient ischemic attack (TIA), and cerebral infarction without residual deficits: Secondary | ICD-10-CM | POA: Insufficient documentation

## 2013-07-03 DIAGNOSIS — R269 Unspecified abnormalities of gait and mobility: Secondary | ICD-10-CM | POA: Insufficient documentation

## 2013-07-03 DIAGNOSIS — E119 Type 2 diabetes mellitus without complications: Secondary | ICD-10-CM | POA: Insufficient documentation

## 2013-07-03 DIAGNOSIS — K5909 Other constipation: Secondary | ICD-10-CM | POA: Insufficient documentation

## 2013-07-03 DIAGNOSIS — M25569 Pain in unspecified knee: Secondary | ICD-10-CM

## 2013-07-03 DIAGNOSIS — M545 Low back pain, unspecified: Secondary | ICD-10-CM

## 2013-07-03 DIAGNOSIS — M1710 Unilateral primary osteoarthritis, unspecified knee: Secondary | ICD-10-CM | POA: Insufficient documentation

## 2013-07-03 DIAGNOSIS — Z79899 Other long term (current) drug therapy: Secondary | ICD-10-CM | POA: Insufficient documentation

## 2013-07-03 DIAGNOSIS — G4733 Obstructive sleep apnea (adult) (pediatric): Secondary | ICD-10-CM | POA: Insufficient documentation

## 2013-07-03 DIAGNOSIS — R42 Dizziness and giddiness: Secondary | ICD-10-CM | POA: Insufficient documentation

## 2013-07-03 DIAGNOSIS — Z87891 Personal history of nicotine dependence: Secondary | ICD-10-CM | POA: Insufficient documentation

## 2013-07-03 DIAGNOSIS — M47817 Spondylosis without myelopathy or radiculopathy, lumbosacral region: Secondary | ICD-10-CM | POA: Insufficient documentation

## 2013-07-03 DIAGNOSIS — Z9981 Dependence on supplemental oxygen: Secondary | ICD-10-CM | POA: Insufficient documentation

## 2013-07-03 DIAGNOSIS — K219 Gastro-esophageal reflux disease without esophagitis: Secondary | ICD-10-CM | POA: Insufficient documentation

## 2013-07-03 DIAGNOSIS — G8929 Other chronic pain: Secondary | ICD-10-CM | POA: Insufficient documentation

## 2013-07-03 DIAGNOSIS — M25561 Pain in right knee: Secondary | ICD-10-CM

## 2013-07-03 DIAGNOSIS — K5900 Constipation, unspecified: Secondary | ICD-10-CM

## 2013-07-03 DIAGNOSIS — H409 Unspecified glaucoma: Secondary | ICD-10-CM | POA: Insufficient documentation

## 2013-07-03 MED ORDER — OXYCODONE-ACETAMINOPHEN 7.5-325 MG PO TABS: 1.0000 | ORAL_TABLET | ORAL | Status: DC | PRN

## 2013-07-03 MED ORDER — MORPHINE SULFATE ER 15 MG PO TBCR: 15.0000 mg | EXTENDED_RELEASE_TABLET | Freq: Two times a day (BID) | ORAL | Status: DC

## 2013-07-03 NOTE — Patient Instructions (Signed)
Water therapy to help with flexion, extension and strengthening of bilateral knees and hips.

## 2013-07-03 NOTE — Progress Notes (Addendum)
Subjective:    Patient ID: Blake Summers, male    DOB: 04-30-1946, 68 y.o.   MRN: [id number]  HPI Mr. Steuber is here for follow up on his chronic right knee and chronic back pain. He is now on oxygen 24/7 but reports that DOE is improving. He was sick during the holidays and was seen by MD and started on medication for yeast infection (had had explosive diarrhea with incontinence). Ulcers on BLE have healed up and he's using new compressive wraps to help with edema control. He reports difficulty with BS management and sugars usually over 150. His lifestyle has changed as wife has retired. (She used to keep him on schedule). Wife was present and seems to be very involved.     Pain Inventory Average Pain 8 Pain Right Now 7 My pain is sharp, burning, stabbing and aching  In the last 24 hours, has pain interfered with the following? General activity 8 Relation with others 9 Enjoyment of life 8 What TIME of day is your pain at its worst? daytime Sleep (in general) Fair  Pain is worse with: walking and standing Pain improves with: rest, heat/ice, medication and injections Relief from Meds: 7  Mobility walk with assistance use a cane use a walker how many minutes can you walk? 0 ability to climb steps?  no do you drive?  yes use a wheelchair needs help with transfers  Function retired I need assistance with the following:  feeding, dressing, meal prep, household duties and shopping  Neuro/Psych numbness tingling trouble walking dizziness  Prior Studies Any changes since last visit?  no  Physicians involved in your care Any changes since last visit?  no   Family History  Problem Relation Age of Onset  . Cancer Brother     Colon Cancer  . Heart disease Brother   . Heart disease Brother   . Asthma Mother    History   Social History  . Marital Status: Married    Spouse Name: N/A    Number of Children: N/A  . Years of Education: N/A   Occupational History    . Retired     Nature conservation officer   Social History Main Topics  . Smoking status: Former Smoker -- 0.50 packs/day for 15 years    Types: Cigarettes    Quit date: 06/12/1988  . Smokeless tobacco: Never Used  . Alcohol Use: No  . Drug Use: No  . Sexual Activity: None   Other Topics Concern  . None   Social History Narrative   Diet is "good"   Exercise is limited by medical problems   Past Surgical History  Procedure Laterality Date  . Lithotripsy  2007   Past Medical History  Diagnosis Date  . GERD 07/06/2010  . Edema 10/28/2007  . DIABETES MELLITUS, TYPE II 05/04/2007  . HYPERLIPIDEMIA 07/08/2008  . ALLERGIC RHINITIS 07/08/2008  . GLAUCOMA 07/08/2008  . CEREBROVASCULAR ACCIDENT, HX OF 07/08/2008  . OBSTRUCTIVE SLEEP APNEA 03/06/2008  . DIABETIC ULCER, LEFT LEG 08/23/2009  . 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 HYPERTENSION 10/28/2007  . VENOUS INSUFFICIENCY 03/08/2009  . FATTY LIVER DISEASE 05/19/2008  . BENIGN PROSTATIC HYPERTROPHY 07/08/2008  . 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 NEPHROLITHIASIS, HX OF 07/08/2008  . Morbid obesity   . DM nephropathy/sclerosis   . 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 Lumbar spondylosis   . Diarrhea 06/08/2013   BP 147/67  Pulse 105  Resp 14  Ht 5\' 6"  (1.676 m)  Wt 337 lb (152.862 kg)  BMI 54.42 kg/m2  SpO2 93%      Review of Systems  Musculoskeletal: Positive for back pain and gait problem.  Neurological: Positive for dizziness and numbness.       Tingling  All other systems reviewed and are negative.       Objective:   Physical Exam  Nursing note and vitals reviewed. Constitutional: He is oriented to person, place, and time. He appears well-developed and well-nourished.  Looks brighter and more animated today  HENT:  Head: Normocephalic and atraumatic.  Eyes: Conjunctivae are normal. Pupils are equal, round, and reactive to light.  Neck: Normal range of motion. Neck supple.  Cardiovascular:  Normal rate and regular rhythm.   Pulmonary/Chest: Effort normal and breath sounds normal. No respiratory distress. He has no wheezes.  Abdominal: Soft. Bowel sounds are normal. He exhibits no distension. There is no tenderness.  Musculoskeletal:  No pedal edema noted. Bilateral shins with compressive wraps--no edema appreciated. ROM right knee improved with minimal lag and no pain with movement. Symmetric normal motor tone is noted throughout. Normal muscle bulk. Muscle testing reveals 5/5 muscle strength of the upper extremity, and 4/5 of the lower extremity.   Back without spinal or paraspinal tenderness.   Neurological: He is alert and oriented to person, place, and time.  Skin: Skin is warm and dry.  Psychiatric: He has a normal mood and affect. His behavior is normal. Thought content normal.          Assessment & Plan:  1. End-stage right knee osteoarthritis:   Continue to use lidocaine patches.   Refilled: MS contin 15 mg #60--use bid.   Percocet 7.5/325 mg # 90 pills--use every four hours as needed for pain.    2. Morbid obesity with generalized deconditioning:  He has silver sneaker membership. Ulcers have healed up and is planning on starting water therapy. Was encouraged to see if they have a trainer who can help him start an exercise program slowly (hasn't been able to exercise for about 18 months per wife).   3. Chronic low back pain/spondylosis: He is using robaxin 2-3 times a day to augment his pain regimen. Continue MS contin with prn percocet.  Continue to follow up with PMD for further adjustment in NPH--aAdvised that improvement in BS control will help in control of neuropathy.   5. Narcotic induced constipation: Is using miralax daily--advised patient and wife to increase miralax to twice a day. Add additional fiber to diet. .Marland Kitchen

## 2013-07-09 ENCOUNTER — Other Ambulatory Visit: Payer: Self-pay | Admitting: Endocrinology

## 2013-07-28 ENCOUNTER — Other Ambulatory Visit: Payer: Self-pay | Admitting: *Deleted

## 2013-07-28 MED ORDER — MORPHINE SULFATE ER 15 MG PO TBCR: 15.0000 mg | EXTENDED_RELEASE_TABLET | Freq: Two times a day (BID) | ORAL | Status: DC

## 2013-07-28 MED ORDER — OXYCODONE-ACETAMINOPHEN 7.5-325 MG PO TABS: 1.0000 | ORAL_TABLET | ORAL | Status: DC | PRN

## 2013-07-28 NOTE — Telephone Encounter (Signed)
RX printed early for controlled medication for the visit with RN on 07/30/13 (to be signed by MD) 

## 2013-07-30 ENCOUNTER — Encounter: Payer: Self-pay | Admitting: *Deleted

## 2013-07-30 ENCOUNTER — Encounter: Payer: Medicare Other | Attending: Physical Medicine & Rehabilitation | Admitting: *Deleted

## 2013-07-30 VITALS — BP 129/41 | HR 84 | Resp 16

## 2013-07-30 DIAGNOSIS — Z9981 Dependence on supplemental oxygen: Secondary | ICD-10-CM | POA: Insufficient documentation

## 2013-07-30 DIAGNOSIS — Z8673 Personal history of transient ischemic attack (TIA), and cerebral infarction without residual deficits: Secondary | ICD-10-CM | POA: Insufficient documentation

## 2013-07-30 DIAGNOSIS — M1711 Unilateral primary osteoarthritis, right knee: Secondary | ICD-10-CM

## 2013-07-30 DIAGNOSIS — M1710 Unilateral primary osteoarthritis, unspecified knee: Secondary | ICD-10-CM | POA: Insufficient documentation

## 2013-07-30 DIAGNOSIS — M25561 Pain in right knee: Secondary | ICD-10-CM

## 2013-07-30 DIAGNOSIS — K5900 Constipation, unspecified: Secondary | ICD-10-CM | POA: Insufficient documentation

## 2013-07-30 DIAGNOSIS — E785 Hyperlipidemia, unspecified: Secondary | ICD-10-CM | POA: Insufficient documentation

## 2013-07-30 DIAGNOSIS — G8929 Other chronic pain: Secondary | ICD-10-CM | POA: Insufficient documentation

## 2013-07-30 DIAGNOSIS — Z87891 Personal history of nicotine dependence: Secondary | ICD-10-CM | POA: Insufficient documentation

## 2013-07-30 DIAGNOSIS — M545 Low back pain, unspecified: Secondary | ICD-10-CM | POA: Insufficient documentation

## 2013-07-30 DIAGNOSIS — E119 Type 2 diabetes mellitus without complications: Secondary | ICD-10-CM | POA: Insufficient documentation

## 2013-07-30 DIAGNOSIS — M25569 Pain in unspecified knee: Secondary | ICD-10-CM | POA: Insufficient documentation

## 2013-07-30 DIAGNOSIS — I10 Essential (primary) hypertension: Secondary | ICD-10-CM | POA: Insufficient documentation

## 2013-07-30 DIAGNOSIS — M47817 Spondylosis without myelopathy or radiculopathy, lumbosacral region: Secondary | ICD-10-CM | POA: Insufficient documentation

## 2013-07-30 DIAGNOSIS — K219 Gastro-esophageal reflux disease without esophagitis: Secondary | ICD-10-CM | POA: Insufficient documentation

## 2013-07-30 MED ORDER — TOPIRAMATE 50 MG PO TABS: 50.0000 mg | ORAL_TABLET | Freq: Two times a day (BID) | ORAL | Status: DC

## 2013-07-30 NOTE — Patient Instructions (Signed)
Follow up one month with RN for med refill 

## 2013-07-30 NOTE — Progress Notes (Signed)
Here for pill count and medication refills.  MS CONTIN 15 mg #60 Fill date  07/09/13  Today NV#   19, Percocet 7.5/325 # 90 fill date 07/09/13 today NV# 29.  No falls but he is a high fall fisk due to weight and unsteady gate as well as medications.  I talked with them about fall prevention and they confirmed that they were given a home fall prevention tip handout last visit.  Pill counts appropriate.  Refills given for MS CONTIN and percocet.  Topamax refilled electronically.  Return in one month for refill and pill count.

## 2013-08-01 ENCOUNTER — Ambulatory Visit: Payer: Medicare Other | Admitting: Endocrinology

## 2013-08-01 ENCOUNTER — Other Ambulatory Visit: Payer: Self-pay | Admitting: Endocrinology

## 2013-08-01 ENCOUNTER — Other Ambulatory Visit: Payer: Self-pay

## 2013-08-01 MED ORDER — DICLOFENAC SODIUM 1 % TD GEL: TRANSDERMAL | Status: DC

## 2013-08-11 ENCOUNTER — Ambulatory Visit (INDEPENDENT_AMBULATORY_CARE_PROVIDER_SITE_OTHER): Payer: Medicare Other | Admitting: Endocrinology

## 2013-08-11 ENCOUNTER — Encounter: Payer: Self-pay | Admitting: Endocrinology

## 2013-08-11 VITALS — BP 126/60 | HR 91 | Temp 98.0°F

## 2013-08-11 DIAGNOSIS — E1065 Type 1 diabetes mellitus with hyperglycemia: Principal | ICD-10-CM

## 2013-08-11 DIAGNOSIS — E1049 Type 1 diabetes mellitus with other diabetic neurological complication: Secondary | ICD-10-CM

## 2013-08-11 LAB — HEMOGLOBIN A1C: Hgb A1c MFr Bld: 6.7 % — ABNORMAL HIGH (ref 4.6–6.5)

## 2013-08-11 NOTE — Progress Notes (Signed)
Subjective:    Patient ID: Blake Summers, male    DOB: [birth year], 68 y.o.   MRN: [id number]  HPI Pt returns for f/u of insulin-requiring DM (dx'ed 1990; he has moderate neuropathy of the lower extremities; he has associated mild CAD, CVA, and foot ulcer; he has never had severe hypoglycemia or DKA; he takes multiple daily injections).   no cbg record, but states cbg's are well-controlled.  It is highest at hs. Past Medical History  Diagnosis Date  . GERD 07/06/2010  . Edema 10/28/2007  . DIABETES MELLITUS, TYPE II 05/04/2007  . HYPERLIPIDEMIA 07/08/2008  . ALLERGIC RHINITIS 07/08/2008  . GLAUCOMA 07/08/2008  . CEREBROVASCULAR ACCIDENT, HX OF 07/08/2008  . OBSTRUCTIVE SLEEP APNEA 03/06/2008  . DIABETIC ULCER, LEFT LEG 08/23/2009  . 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 HYPERTENSION 10/28/2007  . VENOUS INSUFFICIENCY 03/08/2009  . FATTY LIVER DISEASE 05/19/2008  . BENIGN PROSTATIC HYPERTROPHY 07/08/2008  . 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 NEPHROLITHIASIS, HX OF 07/08/2008  . Morbid obesity   . DM nephropathy/sclerosis   . 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 Lumbar spondylosis   . Diarrhea 06/08/2013    Past Surgical History  Procedure Laterality Date  . Lithotripsy  2007    History   Social History  . Marital Status: Married    Spouse Name: N/A    Number of Children: N/A  . Years of Education: N/A   Occupational History  . Retired     Sales promotion account executive   Social History Main Topics  . Smoking status: Former Smoker -- 0.50 packs/day for 15 years    Types: Cigarettes    Quit date: 06/12/1988  . Smokeless tobacco: Never Used  . Alcohol Use: No  . Drug Use: No  . Sexual Activity: Not on file   Other Topics Concern  . Not on file   Social History Narrative   Diet is "good"   Exercise is limited by medical problems    Current Outpatient Prescriptions on File Prior to Visit  Medication Sig Dispense Refill  . AGGRENOX 25-200 MG per 12 hr  capsule TAKE 1 CAPSULE BY MOUTH 2 TIMES DAILY  60 capsule  3  . allopurinol (ZYLOPRIM) 300 MG tablet TAKE 1 TABLET BY MOUTH EVERY DAY  30 tablet  3  . Blood Glucose Monitoring Suppl (BAYER CONTOUR NEXT MONITOR) W/DEVICE KIT 1 Device by Does not apply route once.  1 kit  0  . brimonidine (ALPHAGAN) 0.15 % ophthalmic solution as directed.      Marland Kitchen CLARITIN-D 12 HOUR 5-120 MG per tablet TAKE 1 TABLET BY MOUTH TWICE DAILY  60 tablet  2  . clobetasol ointment (TEMOVATE) 0.05 % Apply topically 3 (three) times daily.  60 g  4  . dexlansoprazole (DEXILANT) 60 MG capsule TAKE 1 CAPSULE BY MOUTH DAILY  30 capsule  6  . diclofenac sodium (VOLTAREN) 1 % GEL APPLY TOPICALLY 4 TIMES DAILY AS PHYSICIAN INSTRUCTED  200 g  3  . diltiazem (CARDIZEM CD) 180 MG 24 hr capsule TAKE 2 CAPSULES BY MOUTH DAILY  60 capsule  3  . docusate sodium (STOOL SOFTENER) 100 MG capsule Take 1 capsule (100 mg total) by mouth 2 (two) times daily.  60 capsule  5  . dorzolamide-timolol (COSOPT) 22.3-6.8 MG/ML ophthalmic solution as directed.      . fluconazole (DIFLUCAN) 150 MG tablet Take 1 tablet (150 mg total) by mouth as directed. Take 1 tab daily X  3 days, then 1 tab po q week X 3 weeks  DO NOT take statin medication on the days you take this medication  6 tablet  0  . furosemide (LASIX) 80 MG tablet Take 1 tablet (80 mg total) by mouth daily.  30 tablet  4  . glucose blood (BAYER CONTOUR NEXT TEST) test strip 1 each by Other route 2 (two) times daily. And lancets 250.01  100 each  12  . HUMULIN N KWIKPEN 100 UNIT/ML SUPN INJECT 80 UNITS SUB-Q EACH NIGHT AT BEDTIME  30 mL  3  . insulin aspart (NOVOLOG) 100 UNIT/ML injection 3x a day (just before each meal) 90-120-140 units      . ketoconazole (NIZORAL) 2 % shampoo as directed.      . latanoprost (XALATAN) 0.005 % ophthalmic solution Place 1 drop into both eyes at bedtime.  2.5 mL  4  . lidocaine (LIDODERM) 5 % Place 1-3 patches onto the skin daily. Remove & Discard patch within 12  hours or as directed by MD  90 patch  2  . losartan-hydrochlorothiazide (HYZAAR) 100-25 MG per tablet Take 1 tablet by mouth daily.  30 tablet  5  . lovastatin (MEVACOR) 40 MG tablet Take 1 tablet (40 mg total) by mouth at bedtime.  60 tablet  3  . metFORMIN (GLUCOPHAGE-XR) 500 MG 24 hr tablet       . metFORMIN (GLUCOPHAGE-XR) 500 MG 24 hr tablet TAKE 1 TABLET BY MOUTH 2 TIMES A DAY  60 tablet  3  . methocarbamol (ROBAXIN) 500 MG tablet Take 1 tablet (500 mg total) by mouth 3 (three) times daily. As needed for cramps.  90 tablet  11  . MICROLET LANCETS MISC       . morphine (MS CONTIN) 15 MG 12 hr tablet Take 1 tablet (15 mg total) by mouth 2 (two) times daily.  60 tablet  0  . naproxen sodium (ANAPROX) 220 MG tablet 2 tablets by mouth once daily as needed for pain       . NOVOFINE 30G X 8 MM MISC USE 4 TIMES A DAY  100 each  3  . oxyCODONE-acetaminophen (PERCOCET) 7.5-325 MG per tablet Take 1 tablet by mouth every 4 (four) hours as needed for pain.  90 tablet  0  . phenol (CHLORASEPTIC) 1.4 % LIQD Use as directed 1 spray in the mouth or throat as needed.  118 mL  0  . polyethylene glycol powder (GLYCOLAX/MIRALAX) powder DISSOLVE 1 CAPFUL (17GM) IN 8 TO 10 OUNCES OF WATER DAILY  527 g  3  . potassium chloride SA (KLOR-CON M20) 20 MEQ tablet Take 1 tablet (20 mEq total) by mouth 2 (two) times daily.  60 tablet  11  . PROVENTIL HFA 108 (90 BASE) MCG/ACT inhaler ONE PUFF EVERY 4 HOURS AS NEEDED  6.7 g  5  . ranitidine (ZANTAC) 150 MG tablet TAKE 2 TABLETS BY MOUTH EVERY NIGHT AT BEDTIME  60 tablet  3  . testosterone (ANDROGEL) 50 MG/5GM GEL 3 tubes daily  90 Tube  5  . topiramate (TOPAMAX) 50 MG tablet Take 1 tablet (50 mg total) by mouth 2 (two) times daily.  60 tablet  3  . zolpidem (AMBIEN) 10 MG tablet Take 1 tablet (10 mg total) by mouth at bedtime as needed.  30 tablet  5   No current facility-administered medications on file prior to visit.    Allergies  Allergen Reactions  . Neosporin  [Neomycin-Polymyxin-Gramicidin]  Family History  Problem Relation Age of Onset  . Cancer Brother     Colon Cancer  . Heart disease Brother   . Heart disease Brother   . Asthma Mother     BP 126/60  Pulse 91  Temp(Src) 98 F (36.7 C) (Oral)  SpO2 94%  Review of Systems He denies hypoglycemia.  He has lost a few lbs.      Objective:   Physical Exam VITAL SIGNS:  See vs page GENERAL: no distress.  Morbid obesity.  In wheelchair.    Lab Results  Component Value Date   HGBA1C 6.7* 08/11/2013      Assessment & Plan:  DM: well-controlled Morbid obesity: this complicates the rx of DM, but he has lost a few lbs--good. Low-back pain: this limits exercise rx of DM.

## 2013-08-11 NOTE — Patient Instructions (Signed)
check your blood sugar twice a day.  vary the time of day when you check, between before the 3 meals, and at bedtime.  also check if you have symptoms of your blood sugar being too high or too low.  please keep a record of the readings and bring it to your next appointment here.  please call us sooner if your blood sugar goes below 70, or if you have a lot of readings over 200.   Please come back for a regular physical appointment in 3 months.   blood tests are being requested for you today.  We'll contact you with results.

## 2013-08-22 ENCOUNTER — Other Ambulatory Visit: Payer: Self-pay | Admitting: *Deleted

## 2013-08-22 ENCOUNTER — Other Ambulatory Visit: Payer: Self-pay | Admitting: Endocrinology

## 2013-08-22 MED ORDER — MORPHINE SULFATE ER 15 MG PO TBCR: 15.0000 mg | EXTENDED_RELEASE_TABLET | Freq: Two times a day (BID) | ORAL | Status: DC

## 2013-08-22 MED ORDER — OXYCODONE-ACETAMINOPHEN 7.5-325 MG PO TABS: 1.0000 | ORAL_TABLET | ORAL | Status: DC | PRN

## 2013-08-22 NOTE — Telephone Encounter (Signed)
RX printed for MD to sign for RN visit 08/26/13 

## 2013-08-26 ENCOUNTER — Encounter: Payer: Self-pay | Admitting: *Deleted

## 2013-08-26 ENCOUNTER — Encounter: Payer: Medicare Other | Attending: Physical Medicine & Rehabilitation | Admitting: *Deleted

## 2013-08-26 VITALS — BP 143/48 | HR 89 | Resp 16

## 2013-08-26 DIAGNOSIS — M25569 Pain in unspecified knee: Secondary | ICD-10-CM | POA: Insufficient documentation

## 2013-08-26 DIAGNOSIS — G4733 Obstructive sleep apnea (adult) (pediatric): Secondary | ICD-10-CM | POA: Insufficient documentation

## 2013-08-26 DIAGNOSIS — E119 Type 2 diabetes mellitus without complications: Secondary | ICD-10-CM | POA: Insufficient documentation

## 2013-08-26 DIAGNOSIS — I10 Essential (primary) hypertension: Secondary | ICD-10-CM | POA: Insufficient documentation

## 2013-08-26 DIAGNOSIS — M25561 Pain in right knee: Secondary | ICD-10-CM

## 2013-08-26 DIAGNOSIS — Z8673 Personal history of transient ischemic attack (TIA), and cerebral infarction without residual deficits: Secondary | ICD-10-CM | POA: Insufficient documentation

## 2013-08-26 DIAGNOSIS — Z87891 Personal history of nicotine dependence: Secondary | ICD-10-CM | POA: Insufficient documentation

## 2013-08-26 DIAGNOSIS — M545 Low back pain, unspecified: Secondary | ICD-10-CM | POA: Insufficient documentation

## 2013-08-26 DIAGNOSIS — N400 Enlarged prostate without lower urinary tract symptoms: Secondary | ICD-10-CM | POA: Insufficient documentation

## 2013-08-26 DIAGNOSIS — M479 Spondylosis, unspecified: Secondary | ICD-10-CM | POA: Insufficient documentation

## 2013-08-26 DIAGNOSIS — M1711 Unilateral primary osteoarthritis, right knee: Secondary | ICD-10-CM

## 2013-08-26 DIAGNOSIS — G8929 Other chronic pain: Secondary | ICD-10-CM | POA: Insufficient documentation

## 2013-08-26 DIAGNOSIS — M1710 Unilateral primary osteoarthritis, unspecified knee: Secondary | ICD-10-CM | POA: Insufficient documentation

## 2013-08-26 NOTE — Progress Notes (Signed)
Here for pill count and medication refills. Percocet 7.5/325 #90  Today NV# 37 Fill date 08/08/13, MS Contin 15 mg # 60 Fill date 08/06/13    Today NV# 25  VSS   No falls though he is a high fall risk.  A Fall prevention handout and education was given last visit. Return in one month for med refill and 2 months to see Dr Riley KillSwartz.

## 2013-08-26 NOTE — Patient Instructions (Signed)
Follow up one month with RN med refill and 2 month with Riley KillSwartz

## 2013-09-23 ENCOUNTER — Ambulatory Visit: Payer: Medicare Other | Admitting: Registered Nurse

## 2013-09-26 ENCOUNTER — Other Ambulatory Visit: Payer: Self-pay | Admitting: Endocrinology

## 2013-10-03 ENCOUNTER — Encounter: Payer: Medicare Other | Attending: Physical Medicine and Rehabilitation | Admitting: Registered Nurse

## 2013-10-03 ENCOUNTER — Encounter: Payer: Self-pay | Admitting: Registered Nurse

## 2013-10-03 VITALS — BP 151/69 | HR 93 | Resp 17 | Ht 66.0 in

## 2013-10-03 DIAGNOSIS — H409 Unspecified glaucoma: Secondary | ICD-10-CM | POA: Insufficient documentation

## 2013-10-03 DIAGNOSIS — Z9981 Dependence on supplemental oxygen: Secondary | ICD-10-CM | POA: Insufficient documentation

## 2013-10-03 DIAGNOSIS — E785 Hyperlipidemia, unspecified: Secondary | ICD-10-CM | POA: Insufficient documentation

## 2013-10-03 DIAGNOSIS — Z79899 Other long term (current) drug therapy: Secondary | ICD-10-CM

## 2013-10-03 DIAGNOSIS — M1710 Unilateral primary osteoarthritis, unspecified knee: Secondary | ICD-10-CM | POA: Insufficient documentation

## 2013-10-03 DIAGNOSIS — G4733 Obstructive sleep apnea (adult) (pediatric): Secondary | ICD-10-CM | POA: Insufficient documentation

## 2013-10-03 DIAGNOSIS — G8929 Other chronic pain: Secondary | ICD-10-CM | POA: Insufficient documentation

## 2013-10-03 DIAGNOSIS — M47817 Spondylosis without myelopathy or radiculopathy, lumbosacral region: Secondary | ICD-10-CM | POA: Insufficient documentation

## 2013-10-03 DIAGNOSIS — K5909 Other constipation: Secondary | ICD-10-CM | POA: Insufficient documentation

## 2013-10-03 DIAGNOSIS — I10 Essential (primary) hypertension: Secondary | ICD-10-CM | POA: Insufficient documentation

## 2013-10-03 DIAGNOSIS — M25561 Pain in right knee: Secondary | ICD-10-CM

## 2013-10-03 DIAGNOSIS — Z8673 Personal history of transient ischemic attack (TIA), and cerebral infarction without residual deficits: Secondary | ICD-10-CM | POA: Insufficient documentation

## 2013-10-03 DIAGNOSIS — M6250 Muscle wasting and atrophy, not elsewhere classified, unspecified site: Secondary | ICD-10-CM | POA: Insufficient documentation

## 2013-10-03 DIAGNOSIS — R42 Dizziness and giddiness: Secondary | ICD-10-CM | POA: Insufficient documentation

## 2013-10-03 DIAGNOSIS — IMO0002 Reserved for concepts with insufficient information to code with codable children: Secondary | ICD-10-CM

## 2013-10-03 DIAGNOSIS — M25569 Pain in unspecified knee: Secondary | ICD-10-CM

## 2013-10-03 DIAGNOSIS — Z87891 Personal history of nicotine dependence: Secondary | ICD-10-CM | POA: Insufficient documentation

## 2013-10-03 DIAGNOSIS — K219 Gastro-esophageal reflux disease without esophagitis: Secondary | ICD-10-CM | POA: Insufficient documentation

## 2013-10-03 DIAGNOSIS — Z5181 Encounter for therapeutic drug level monitoring: Secondary | ICD-10-CM

## 2013-10-03 DIAGNOSIS — R269 Unspecified abnormalities of gait and mobility: Secondary | ICD-10-CM | POA: Insufficient documentation

## 2013-10-03 DIAGNOSIS — M1711 Unilateral primary osteoarthritis, right knee: Secondary | ICD-10-CM

## 2013-10-03 DIAGNOSIS — E119 Type 2 diabetes mellitus without complications: Secondary | ICD-10-CM | POA: Insufficient documentation

## 2013-10-03 MED ORDER — OXYCODONE-ACETAMINOPHEN 7.5-325 MG PO TABS: 1.0000 | ORAL_TABLET | ORAL | Status: DC | PRN

## 2013-10-03 MED ORDER — MORPHINE SULFATE ER 15 MG PO TBCR: 15.0000 mg | EXTENDED_RELEASE_TABLET | Freq: Two times a day (BID) | ORAL | Status: DC

## 2013-10-03 NOTE — Progress Notes (Signed)
Subjective:    Patient ID: Blake Summers, male    DOB: [birth year], 68 y.o.   MRN: [id number]  HPI: Blake Summers is a 68 year old male who returns for follow up for chronic pain and medication refill. He arrived in his wheelchair. He says his pain is in his right knee and lower back. He rates his pain an 8. His current exercise regime is walking very short distances in his home. He uses a walker, walking stick and has a motorized wheelchair. He says he is unable to bear weight for long period of time.  His wife is with him.  Pain Inventory Average Pain 8 Pain Right Now 8 My pain is sharp, stabbing and aching  In the last 24 hours, has pain interfered with the following? General activity 8 Relation with others 9 Enjoyment of life 8 What TIME of day is your pain at its worst? daytime Sleep (in general) Poor  Pain is worse with: walking and standing Pain improves with: rest, heat/ice, medication and injections Relief from Meds: 7  Mobility walk with assistance use a walker ability to climb steps?  yes do you drive?  yes use a wheelchair transfers alone  Function retired  Neuro/Psych weakness numbness tingling trouble walking dizziness  Prior Studies Any changes since last visit?  no  Physicians involved in your care Any changes since last visit?  no   Family History  Problem Relation Age of Onset  . Cancer Brother     Colon Cancer  . Heart disease Brother   . Heart disease Brother   . Asthma Mother    History   Social History  . Marital Status: Married    Spouse Name: N/A    Number of Children: N/A  . Years of Education: N/A   Occupational History  . Retired     Nature conservation officeroperations manager   Social History Main Topics  . Smoking status: Former Smoker -- 0.50 packs/day for 15 years    Types: Cigarettes    Quit date: 06/12/1988  . Smokeless tobacco: Never Used  . Alcohol Use: No  . Drug Use: No  . Sexual Activity: None   Other Topics Concern    . None   Social History Narrative   Diet is "good"   Exercise is limited by medical problems   Past Surgical History  Procedure Laterality Date  . Lithotripsy  2007   Past Medical History  Diagnosis Date  . GERD 07/06/2010  . Edema 10/28/2007  . DIABETES MELLITUS, TYPE II 05/04/2007  . HYPERLIPIDEMIA 07/08/2008  . ALLERGIC RHINITIS 07/08/2008  . GLAUCOMA 07/08/2008  . CEREBROVASCULAR ACCIDENT, HX OF 07/08/2008  . OBSTRUCTIVE SLEEP APNEA 03/06/2008  . DIABETIC ULCER, LEFT LEG 08/23/2009  . 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 HYPERTENSION 10/28/2007  . VENOUS INSUFFICIENCY 03/08/2009  . FATTY LIVER DISEASE 05/19/2008  . BENIGN PROSTATIC HYPERTROPHY 07/08/2008  . 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 NEPHROLITHIASIS, HX OF 07/08/2008  . Morbid obesity   . DM nephropathy/sclerosis   . 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 Lumbar spondylosis   . Diarrhea 06/08/2013   BP 151/69  Pulse 93  Resp 17  Ht 5\' 6"  (1.676 m)  SpO2 92%  Opioid Risk Score:   Fall Risk Score:     Review of Systems  Musculoskeletal: Positive for gait problem.  Neurological: Positive for tremors, weakness and numbness.       Tingling  All other  systems reviewed and are negative.      Objective:   Physical Exam  Nursing note and vitals reviewed. Constitutional: He is oriented to person, place, and time. He appears well-developed and well-nourished.  Morbid Obese  HENT:  Head: Normocephalic and atraumatic.  Neck: Normal range of motion. Neck supple.  Cardiovascular: Normal rate, regular rhythm and normal heart sounds.   Pulmonary/Chest: Effort normal and breath sounds normal.  Musculoskeletal:  Normal Muscle Bulk: Muscle testing Reveals; Upper Extremities with Full ROM/ Strength  5/5 Lower Extremities: Bilateral Compression Velcro Wrap Stockings on, Bilaterally Left Leg: Full ROM Right Leg: Decreased ROM Wheelchair    Neurological: He is alert and oriented to person, place,  and time.  Skin: Skin is warm and dry.  Psychiatric: He has a normal mood and affect.          Assessment & Plan:  1. End-stage right knee osteoarthritis: Continue to use Voltaren gel Refilled: MS contin 15 mg #60--use bid.  Percocet 7.5/325 mg # 90 pills--use every four hours as needed for pain.  2. Morbid obesity with generalized deconditioning: He has silver sneaker membership. [facility] Ulcers have healed totally. 3. Chronic low back pain/spondylosis: He is using Anaprox as needed.  Continue MS contin with prn percocet.  5. Narcotic induced constipation: Is using miralax daily. We discussed high fiber diet and to try organic vegetables and water intake.   30 minutes of face to face patient care time was spent during this visit. All questions were encouraged and answered.  F/U in 1 month

## 2013-10-08 ENCOUNTER — Other Ambulatory Visit: Payer: Self-pay | Admitting: Endocrinology

## 2013-10-21 ENCOUNTER — Encounter: Payer: Self-pay | Admitting: Physical Medicine & Rehabilitation

## 2013-10-21 ENCOUNTER — Encounter: Payer: Medicare Other | Attending: Physical Medicine and Rehabilitation | Admitting: Physical Medicine & Rehabilitation

## 2013-10-21 VITALS — BP 147/63 | HR 91 | Resp 14 | Ht 66.0 in | Wt 337.0 lb

## 2013-10-21 DIAGNOSIS — Z9981 Dependence on supplemental oxygen: Secondary | ICD-10-CM | POA: Insufficient documentation

## 2013-10-21 DIAGNOSIS — E119 Type 2 diabetes mellitus without complications: Secondary | ICD-10-CM | POA: Insufficient documentation

## 2013-10-21 DIAGNOSIS — R269 Unspecified abnormalities of gait and mobility: Secondary | ICD-10-CM | POA: Insufficient documentation

## 2013-10-21 DIAGNOSIS — K5909 Other constipation: Secondary | ICD-10-CM | POA: Insufficient documentation

## 2013-10-21 DIAGNOSIS — H409 Unspecified glaucoma: Secondary | ICD-10-CM | POA: Insufficient documentation

## 2013-10-21 DIAGNOSIS — M545 Low back pain, unspecified: Secondary | ICD-10-CM

## 2013-10-21 DIAGNOSIS — G609 Hereditary and idiopathic neuropathy, unspecified: Secondary | ICD-10-CM

## 2013-10-21 DIAGNOSIS — M47817 Spondylosis without myelopathy or radiculopathy, lumbosacral region: Secondary | ICD-10-CM | POA: Insufficient documentation

## 2013-10-21 DIAGNOSIS — M1711 Unilateral primary osteoarthritis, right knee: Secondary | ICD-10-CM

## 2013-10-21 DIAGNOSIS — G8929 Other chronic pain: Secondary | ICD-10-CM | POA: Insufficient documentation

## 2013-10-21 DIAGNOSIS — E785 Hyperlipidemia, unspecified: Secondary | ICD-10-CM | POA: Insufficient documentation

## 2013-10-21 DIAGNOSIS — R2689 Other abnormalities of gait and mobility: Secondary | ICD-10-CM

## 2013-10-21 DIAGNOSIS — K219 Gastro-esophageal reflux disease without esophagitis: Secondary | ICD-10-CM | POA: Insufficient documentation

## 2013-10-21 DIAGNOSIS — E1049 Type 1 diabetes mellitus with other diabetic neurological complication: Secondary | ICD-10-CM

## 2013-10-21 DIAGNOSIS — IMO0002 Reserved for concepts with insufficient information to code with codable children: Secondary | ICD-10-CM

## 2013-10-21 DIAGNOSIS — I10 Essential (primary) hypertension: Secondary | ICD-10-CM | POA: Insufficient documentation

## 2013-10-21 DIAGNOSIS — M6250 Muscle wasting and atrophy, not elsewhere classified, unspecified site: Secondary | ICD-10-CM | POA: Insufficient documentation

## 2013-10-21 DIAGNOSIS — M1710 Unilateral primary osteoarthritis, unspecified knee: Secondary | ICD-10-CM | POA: Insufficient documentation

## 2013-10-21 DIAGNOSIS — Z79899 Other long term (current) drug therapy: Secondary | ICD-10-CM | POA: Insufficient documentation

## 2013-10-21 DIAGNOSIS — M1990 Unspecified osteoarthritis, unspecified site: Secondary | ICD-10-CM

## 2013-10-21 DIAGNOSIS — G4733 Obstructive sleep apnea (adult) (pediatric): Secondary | ICD-10-CM | POA: Insufficient documentation

## 2013-10-21 DIAGNOSIS — R42 Dizziness and giddiness: Secondary | ICD-10-CM | POA: Insufficient documentation

## 2013-10-21 DIAGNOSIS — E1065 Type 1 diabetes mellitus with hyperglycemia: Secondary | ICD-10-CM

## 2013-10-21 DIAGNOSIS — Z8673 Personal history of transient ischemic attack (TIA), and cerebral infarction without residual deficits: Secondary | ICD-10-CM | POA: Insufficient documentation

## 2013-10-21 DIAGNOSIS — Z87891 Personal history of nicotine dependence: Secondary | ICD-10-CM | POA: Insufficient documentation

## 2013-10-21 MED ORDER — MORPHINE SULFATE ER 15 MG PO TBCR: 15.0000 mg | EXTENDED_RELEASE_TABLET | Freq: Two times a day (BID) | ORAL | Status: DC

## 2013-10-21 MED ORDER — OXYCODONE-ACETAMINOPHEN 7.5-325 MG PO TABS: 1.0000 | ORAL_TABLET | ORAL | Status: DC | PRN

## 2013-10-21 NOTE — Progress Notes (Signed)
Subjective:    Patient ID: Blake Summers, male    DOB: March 24, 1946, 68 y.o.   MRN: [id number]  HPI  [name] is here in regard to his chronic back and knee pain. He has been seen by Dr. Pamelia HoitKirchmayer chronically until she left this group last year. He has seen Dr. Lequita HaltAluisio for opinion and treatment for his knees and he was deemed not a surgical candidate. Knee injections are no longer helpful.   He is unable to exercise regularly given his weight and severe lung disease. He is able to participate in some limited upper extremity exercises however.   He suffers from diabetes and multiple other issues including sleep apnea and chronic respiratory failure. He is on 3L oxygen continuously. He uses a power chair to move around his home.  His wife has been working hard to control his lower ext edema. He has velcro wraps currently which have been effective. The edema has been quite difficult to control over the years.   Pain Inventory Average Pain 8 Pain Right Now 7 My pain is sharp, burning, stabbing and aching  In the last 24 hours, has pain interfered with the following? General activity 8 Relation with others 7 Enjoyment of life 8 What TIME of day is your pain at its worst? daytime Sleep (in general) Poor  Pain is worse with: walking and standing Pain improves with: rest, heat/ice, pacing activities, medication and injections Relief from Meds: 8  Mobility walk with assistance use a cane use a walker ability to climb steps?  yes do you drive?  yes use a wheelchair needs help with transfers  Function retired I need assistance with the following:  dressing, meal prep, household duties and shopping  Neuro/Psych weakness numbness tingling trouble walking dizziness  Prior Studies Any changes since last visit?  no  Physicians involved in your care Any changes since last visit?  no   Family History  Problem Relation Age of Onset  . Cancer Brother     Colon Cancer  .  Heart disease Brother   . Heart disease Brother   . Asthma Mother    History   Social History  . Marital Status: Married    Spouse Name: N/A    Number of Children: N/A  . Years of Education: N/A   Occupational History  . Retired     Nature conservation officeroperations manager   Social History Main Topics  . Smoking status: Former Smoker -- 0.50 packs/day for 15 years    Types: Cigarettes    Quit date: 06/12/1988  . Smokeless tobacco: Never Used  . Alcohol Use: No  . Drug Use: No  . Sexual Activity: None   Other Topics Concern  . None   Social History Narrative   Diet is "good"   Exercise is limited by medical problems   Past Surgical History  Procedure Laterality Date  . Lithotripsy  2007   Past Medical History  Diagnosis Date  . GERD 07/06/2010  . Edema 10/28/2007  . DIABETES MELLITUS, TYPE II 05/04/2007  . HYPERLIPIDEMIA 07/08/2008  . ALLERGIC RHINITIS 07/08/2008  . GLAUCOMA 07/08/2008  . CEREBROVASCULAR ACCIDENT, HX OF 07/08/2008  . OBSTRUCTIVE SLEEP APNEA 03/06/2008  . DIABETIC ULCER, LEFT LEG 08/23/2009  . 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 HYPERTENSION 10/28/2007  . VENOUS INSUFFICIENCY 03/08/2009  . FATTY LIVER DISEASE 05/19/2008  . BENIGN PROSTATIC HYPERTROPHY 07/08/2008  . 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 NEPHROLITHIASIS, HX OF 07/08/2008  . Morbid obesity   .  DM nephropathy/sclerosis   . 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 Lumbar spondylosis   . Diarrhea 06/08/2013   BP 147/63  Pulse 91  Resp 14  Ht 5\' 6"  (1.676 m)  Wt 337 lb (152.862 kg)  BMI 54.42 kg/m2  SpO2 92%  Opioid Risk Score:   Fall Risk Score: High Fall Risk (>13 points) (patient educated handout declined)   Review of Systems  Musculoskeletal: Positive for back pain and gait problem.  Neurological: Positive for dizziness, weakness and numbness.  All other systems reviewed and are negative.      Objective:   Physical Exam  Nursing note and vitals reviewed.  Constitutional:  He is oriented to person, place, and time. He appears well-developed and well-nourished.  Looks bright and in no distress  HENT:  Head: Normocephalic and atraumatic.  Eyes: Conjunctivae are normal. Pupils are equal, round, and reactive to light.  Neck: Normal range of motion. Neck supple.  Cardiovascular: Normal rate and regular rhythm.  Pulmonary/Chest: Effort normal and breath sounds normal. No respiratory distress. He has no wheezes.  Abdominal: Soft. Bowel sounds are normal. He exhibits no distension. There is no tenderness.  Musculoskeletal:  No pedal edema noted. Bilateral shins with compressive wraps--minimal edema appreciated. ROM right knee improved with minimal lag and no pain with movement. He has a hinged kneed brace in place. Symmetric normal motor tone is noted throughout. Normal muscle bulk. Muscle testing reveals 5/5 muscle strength of the upper extremity, and 4/5 of the lower extremity.   Back without spinal or paraspinal tenderness.  Neurological: He is alert and oriented to person, place, and time.  Skin: Skin is warm and dry.  Psychiatric: He has a normal mood and affect. His behavior is normal. Thought content normal.   Assessment & Plan:   1. End-stage right knee osteoarthritis: Continue to use lidocaine patches.  Refilled: MS contin 15 mg #60--use bid.  Percocet 7.5/325 mg # 90 pills--use every four hours as needed for pain.  -offered a right OA knee brace---rx given. He may visit the prosthetist for assessment/trial. The question is how useful would this be given his limited mobility  2. Morbid obesity with generalized deconditioning: He has silver Industrial/product designersneaker membership. Continue to work on aggressive skin care and edema control.  3. Chronic low back pain/spondylosis: He is using robaxin 2-3 times a day to augment his pain regimen.   -consider further imaging if we want to do anything more interventional. 4.  DM1---continu per pcp.   5. Narcotic induced  constipation:miralax, fiber  30 minutes of face to face patient care time were spent during this visit. All questions were encouraged and answered.  Follow up in one month with NP .

## 2013-10-21 NOTE — Patient Instructions (Signed)
THINK ABOUT YOUR POSTURE AND LENGTHENING YOUR BACK AND UNLOADING YOUR BACK. YOU CAN DO THIS WHILE SITTING IN YOUR CHAIR.

## 2013-10-22 ENCOUNTER — Ambulatory Visit: Payer: Medicare Other | Admitting: Pulmonary Disease

## 2013-10-29 ENCOUNTER — Other Ambulatory Visit: Payer: Self-pay | Admitting: Endocrinology

## 2013-11-11 ENCOUNTER — Encounter: Payer: Self-pay | Admitting: Endocrinology

## 2013-11-11 ENCOUNTER — Ambulatory Visit (INDEPENDENT_AMBULATORY_CARE_PROVIDER_SITE_OTHER): Payer: Medicare Other | Admitting: Endocrinology

## 2013-11-11 VITALS — BP 120/62 | HR 82 | Temp 98.3°F

## 2013-11-11 DIAGNOSIS — E1049 Type 1 diabetes mellitus with other diabetic neurological complication: Secondary | ICD-10-CM

## 2013-11-11 DIAGNOSIS — E1065 Type 1 diabetes mellitus with hyperglycemia: Principal | ICD-10-CM

## 2013-11-11 MED ORDER — LACTULOSE 10 GM/15ML PO SOLN: 30.0000 g | Freq: Two times a day (BID) | ORAL | Status: DC

## 2013-11-11 NOTE — Progress Notes (Signed)
Subjective:    Patient ID: Blake Summers, male    DOB: 1946/01/03, 68 y.o.   MRN: [id number]  HPI Pt returns for f/u of insulin-requiring DM (dx'ed 1990; he has moderate neuropathy of the lower extremities; he has associated mild CAD, CVA, and foot ulcer; he has been on insulin since 1999; he has never had pancreatitis, severe hypoglycemia or DKA; he takes multiple daily injections; he was turned down for weight-loss surgery, due to comorbidities).   he brings a record of his cbg's which i have reviewed today.   It varies from 47-250.  The mild hypoglycemia was noted before breakfast and before lunch.   Past Medical History  Diagnosis Date  . GERD 07/06/2010  . Edema 10/28/2007  . DIABETES MELLITUS, TYPE II 05/04/2007  . HYPERLIPIDEMIA 07/08/2008  . ALLERGIC RHINITIS 07/08/2008  . GLAUCOMA 07/08/2008  . CEREBROVASCULAR ACCIDENT, HX OF 07/08/2008  . OBSTRUCTIVE SLEEP APNEA 03/06/2008  . DIABETIC ULCER, LEFT LEG 08/23/2009  . 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 HYPERTENSION 10/28/2007  . VENOUS INSUFFICIENCY 03/08/2009  . FATTY LIVER DISEASE 05/19/2008  . BENIGN PROSTATIC HYPERTROPHY 07/08/2008  . 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 NEPHROLITHIASIS, HX OF 07/08/2008  . Morbid obesity   . DM nephropathy/sclerosis   . 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 Lumbar spondylosis   . Diarrhea 06/08/2013    Past Surgical History  Procedure Laterality Date  . Lithotripsy  2007    History   Social History  . Marital Status: Married    Spouse Name: N/A    Number of Children: N/A  . Years of Education: N/A   Occupational History  . Retired     Sales promotion account executive   Social History Main Topics  . Smoking status: Former Smoker -- 0.50 packs/day for 15 years    Types: Cigarettes    Quit date: 06/12/1988  . Smokeless tobacco: Never Used  . Alcohol Use: No  . Drug Use: No  . Sexual Activity: Not on file   Other Topics Concern  . Not on file   Social History  Narrative   Diet is "good"   Exercise is limited by medical problems    Current Outpatient Prescriptions on File Prior to Visit  Medication Sig Dispense Refill  . AGGRENOX 25-200 MG per 12 hr capsule TAKE 1 CAPSULE BY MOUTH 2 TIMES DAILY  60 capsule  3  . allopurinol (ZYLOPRIM) 300 MG tablet TAKE 1 TABLET BY MOUTH EVERY DAY  30 tablet  3  . Blood Glucose Monitoring Suppl (BAYER CONTOUR NEXT MONITOR) W/DEVICE KIT 1 Device by Does not apply route once.  1 kit  0  . brimonidine (ALPHAGAN) 0.15 % ophthalmic solution as directed.      Marland Kitchen CLARITIN-D 12 HOUR 5-120 MG per tablet TAKE 1 TABLET BY MOUTH 2 TIMES A DAY  60 tablet  4  . clobetasol ointment (TEMOVATE) 0.05 % Apply topically 3 (three) times daily.  60 g  4  . dexlansoprazole (DEXILANT) 60 MG capsule TAKE 1 CAPSULE BY MOUTH DAILY  30 capsule  6  . diclofenac sodium (VOLTAREN) 1 % GEL APPLY TOPICALLY 4 TIMES DAILY AS PHYSICIAN INSTRUCTED  200 g  3  . diltiazem (CARDIZEM CD) 180 MG 24 hr capsule TAKE 2 CAPSULES BY MOUTH DAILY  60 capsule  3  . docusate sodium (COLACE) 100 MG capsule TAKE 1 CAPSULE BY MOUTH 2 TIMES DAILY  60 capsule  4  . dorzolamide-timolol (COSOPT)  22.3-6.8 MG/ML ophthalmic solution as directed.      . fluconazole (DIFLUCAN) 150 MG tablet Take 1 tablet (150 mg total) by mouth as directed. Take 1 tab daily X 3 days, then 1 tab po q week X 3 weeks  DO NOT take statin medication on the days you take this medication  6 tablet  0  . furosemide (LASIX) 80 MG tablet TAKE 1 TABLET BY MOUTH EVERY DAY  30 tablet  4  . glucose blood (BAYER CONTOUR NEXT TEST) test strip 1 each by Other route 2 (two) times daily. And lancets 250.01  100 each  12  . insulin aspart (NOVOLOG) 100 UNIT/ML injection 3x a day (just before each meal) 90-120-140 units      . ketoconazole (NIZORAL) 2 % shampoo as directed.      . latanoprost (XALATAN) 0.005 % ophthalmic solution Place 1 drop into both eyes at bedtime.  2.5 mL  4  . lidocaine (LIDODERM) 5 % Place  1-3 patches onto the skin daily. Remove & Discard patch within 12 hours or as directed by MD  90 patch  2  . losartan-hydrochlorothiazide (HYZAAR) 100-25 MG per tablet TAKE 1 TABLET BY MOUTH EVERY DAY  30 tablet  4  . lovastatin (MEVACOR) 40 MG tablet TAKE 1 TABLET BY MOUTH AT BEDTIME FOR CHOLESTEROL  60 tablet  4  . metFORMIN (GLUCOPHAGE-XR) 500 MG 24 hr tablet       . methocarbamol (ROBAXIN) 500 MG tablet Take 1 tablet (500 mg total) by mouth 3 (three) times daily. As needed for cramps.  90 tablet  11  . MICROLET LANCETS MISC       . morphine (MS CONTIN) 15 MG 12 hr tablet Take 1 tablet (15 mg total) by mouth 2 (two) times daily.  60 tablet  0  . naproxen sodium (ANAPROX) 220 MG tablet 2 tablets by mouth once daily as needed for pain       . NOVOFINE 30G X 8 MM MISC USE 4 TIMES A DAY  100 each  3  . NOVOLOG FLEXPEN 100 UNIT/ML FlexPen INJECT 3 TIMES PER DAY JUST BEFORE EACH MEAL (90-120-160 UNITS)  135 mL  2  . oxyCODONE-acetaminophen (PERCOCET) 7.5-325 MG per tablet Take 1 tablet by mouth every 4 (four) hours as needed for pain.  90 tablet  0  . phenol (CHLORASEPTIC) 1.4 % LIQD Use as directed 1 spray in the mouth or throat as needed.  118 mL  0  . polyethylene glycol powder (GLYCOLAX/MIRALAX) powder DISSOLVE 1 CAPFUL (17 GRAMS) IN 8 TO 10 OUNCES OF WATER DAILY  527 g  4  . potassium chloride SA (KLOR-CON M20) 20 MEQ tablet Take 1 tablet (20 mEq total) by mouth 2 (two) times daily.  60 tablet  11  . PROVENTIL HFA 108 (90 BASE) MCG/ACT inhaler ONE PUFF EVERY 4 HOURS AS NEEDED  6.7 g  5  . ranitidine (ZANTAC) 150 MG tablet TAKE 2 TABLETS BY MOUTH EVERY NIGHT AT BEDTIME  60 tablet  3  . testosterone (ANDROGEL) 50 MG/5GM GEL 3 tubes daily  90 Tube  5  . topiramate (TOPAMAX) 50 MG tablet Take 1 tablet (50 mg total) by mouth 2 (two) times daily.  60 tablet  3  . zolpidem (AMBIEN) 10 MG tablet Take 1 tablet (10 mg total) by mouth at bedtime as needed.  30 tablet  5   No current facility-administered  medications on file prior to visit.    Allergies  Allergen Reactions  . Neosporin [Neomycin-Polymyxin-Gramicidin]     Family History  Problem Relation Age of Onset  . Cancer Brother     Colon Cancer  . Heart disease Brother   . Heart disease Brother   . Asthma Mother     BP 120/62  Pulse 82  Temp(Src) 98.3 F (36.8 C) (Oral)  SpO2 93%  Review of Systems Denies LOC and weight change.    Objective:   Physical Exam VITAL SIGNS:  See vs page GENERAL: no distress.  Morbid obesity.  In wheelchair.   SKIN:  Insulin injection sites at the anterior abdomen are normal.    Lab Results  Component Value Date   HGBA1C 7.0* 11/11/2013      Assessment & Plan:  DM: The pattern of his cbg's indicates he needs some adjustment in his therapy Side-effect of treatment: hypoglycemia.  In view of his health problems, he should reduce his insulin.   Painful neuropathy: this severely limits exercise rx of DM: This impairs the ability to achieve glycemic control.  I'll work around this as best I can.     Patient is advised the following: Patient Instructions  check your blood sugar twice a day.  vary the time of day when you check, between before the 3 meals, and at bedtime.  also check if you have symptoms of your blood sugar being too high or too low.  please keep a record of the readings and bring it to your next appointment here.  please call us sooner if your blood sugar goes below 70, or if you have a lot of readings over 200.   Please come back for a regular physical appointment in 3 months.   blood tests are requested for you today.  We'll contact you with results.   i have sent a prescription to your pharmacy, for the bowels.  You can take this along with the miralax.

## 2013-11-11 NOTE — Patient Instructions (Addendum)
check your blood sugar twice a day.  vary the time of day when you check, between before the 3 meals, and at bedtime.  also check if you have symptoms of your blood sugar being too high or too low.  please keep a record of the readings and bring it to your next appointment here.  please call us sooner if your blood sugar goes below 70, or if you have a lot of readings over 200.   Please come back for a regular physical appointment in 3 months.   blood tests are requested for you today.  We'll contact you with results.   i have sent a prescription to your pharmacy, for the bowels.  You can take this along with the miralax.

## 2013-11-12 LAB — HEMOGLOBIN A1C: Hgb A1c MFr Bld: 7 % — ABNORMAL HIGH (ref 4.6–6.5)

## 2013-11-19 ENCOUNTER — Encounter: Payer: Self-pay | Admitting: Registered Nurse

## 2013-11-19 ENCOUNTER — Encounter: Payer: Medicare Other | Attending: Physical Medicine and Rehabilitation | Admitting: Registered Nurse

## 2013-11-19 VITALS — BP 137/54 | HR 89 | Resp 14 | Ht 66.0 in | Wt 337.0 lb

## 2013-11-19 DIAGNOSIS — M1711 Unilateral primary osteoarthritis, right knee: Secondary | ICD-10-CM

## 2013-11-19 DIAGNOSIS — E1065 Type 1 diabetes mellitus with hyperglycemia: Secondary | ICD-10-CM

## 2013-11-19 DIAGNOSIS — R42 Dizziness and giddiness: Secondary | ICD-10-CM | POA: Insufficient documentation

## 2013-11-19 DIAGNOSIS — Z8673 Personal history of transient ischemic attack (TIA), and cerebral infarction without residual deficits: Secondary | ICD-10-CM | POA: Insufficient documentation

## 2013-11-19 DIAGNOSIS — G609 Hereditary and idiopathic neuropathy, unspecified: Secondary | ICD-10-CM

## 2013-11-19 DIAGNOSIS — E785 Hyperlipidemia, unspecified: Secondary | ICD-10-CM | POA: Insufficient documentation

## 2013-11-19 DIAGNOSIS — M47817 Spondylosis without myelopathy or radiculopathy, lumbosacral region: Secondary | ICD-10-CM | POA: Insufficient documentation

## 2013-11-19 DIAGNOSIS — K219 Gastro-esophageal reflux disease without esophagitis: Secondary | ICD-10-CM | POA: Insufficient documentation

## 2013-11-19 DIAGNOSIS — IMO0002 Reserved for concepts with insufficient information to code with codable children: Secondary | ICD-10-CM

## 2013-11-19 DIAGNOSIS — Z79899 Other long term (current) drug therapy: Secondary | ICD-10-CM | POA: Insufficient documentation

## 2013-11-19 DIAGNOSIS — R2689 Other abnormalities of gait and mobility: Secondary | ICD-10-CM

## 2013-11-19 DIAGNOSIS — H409 Unspecified glaucoma: Secondary | ICD-10-CM | POA: Insufficient documentation

## 2013-11-19 DIAGNOSIS — Z9981 Dependence on supplemental oxygen: Secondary | ICD-10-CM | POA: Insufficient documentation

## 2013-11-19 DIAGNOSIS — G4733 Obstructive sleep apnea (adult) (pediatric): Secondary | ICD-10-CM | POA: Insufficient documentation

## 2013-11-19 DIAGNOSIS — M6250 Muscle wasting and atrophy, not elsewhere classified, unspecified site: Secondary | ICD-10-CM | POA: Insufficient documentation

## 2013-11-19 DIAGNOSIS — R269 Unspecified abnormalities of gait and mobility: Secondary | ICD-10-CM | POA: Insufficient documentation

## 2013-11-19 DIAGNOSIS — G8929 Other chronic pain: Secondary | ICD-10-CM | POA: Insufficient documentation

## 2013-11-19 DIAGNOSIS — E1049 Type 1 diabetes mellitus with other diabetic neurological complication: Secondary | ICD-10-CM

## 2013-11-19 DIAGNOSIS — M545 Low back pain, unspecified: Secondary | ICD-10-CM

## 2013-11-19 DIAGNOSIS — K5909 Other constipation: Secondary | ICD-10-CM | POA: Insufficient documentation

## 2013-11-19 DIAGNOSIS — I10 Essential (primary) hypertension: Secondary | ICD-10-CM | POA: Insufficient documentation

## 2013-11-19 DIAGNOSIS — Z5181 Encounter for therapeutic drug level monitoring: Secondary | ICD-10-CM

## 2013-11-19 DIAGNOSIS — M1990 Unspecified osteoarthritis, unspecified site: Secondary | ICD-10-CM

## 2013-11-19 DIAGNOSIS — E119 Type 2 diabetes mellitus without complications: Secondary | ICD-10-CM | POA: Insufficient documentation

## 2013-11-19 DIAGNOSIS — Z87891 Personal history of nicotine dependence: Secondary | ICD-10-CM | POA: Insufficient documentation

## 2013-11-19 DIAGNOSIS — M1710 Unilateral primary osteoarthritis, unspecified knee: Secondary | ICD-10-CM | POA: Insufficient documentation

## 2013-11-19 MED ORDER — MORPHINE SULFATE ER 15 MG PO TBCR: 15.0000 mg | EXTENDED_RELEASE_TABLET | Freq: Two times a day (BID) | ORAL | Status: DC

## 2013-11-19 MED ORDER — OXYCODONE-ACETAMINOPHEN 7.5-325 MG PO TABS: 1.0000 | ORAL_TABLET | ORAL | Status: DC | PRN

## 2013-11-19 NOTE — Progress Notes (Signed)
Subjective:    Patient ID: Blake Summers, male    DOB: 1946-05-26, 68 y.o.   MRN: [id number]  HPI: [name] is a 68 year old male who returns for follow up for chronic pain and medication refill. He says his pain is located in his lower back and right knee. He rates his pain 7. His current exercise regime is performing stretching exercises. He arrived to clinic in his motorized wheelchair. He says he uses his walker in his home for short distances. He's not wearing his knee brace today. He has bilateral velcro wraps on to lower extremities.  Pain Inventory Average Pain 8 Pain Right Now 7 My pain is sharp, stabbing and aching  In the last 24 hours, has pain interfered with the following? General activity 8 Relation with others 9 Enjoyment of life 8 What TIME of day is your pain at its worst? daytime Sleep (in general) Poor  Pain is worse with: walking and standing Pain improves with: rest, heat/ice, medication and injections Relief from Meds: 7  Mobility walk with assistance use a cane use a walker ability to climb steps?  yes do you drive?  yes use a wheelchair transfers alone  Function retired I need assistance with the following:  dressing, meal prep, household duties and shopping  Neuro/Psych numbness tingling trouble walking dizziness  Prior Studies Any changes since last visit?  no  Physicians involved in your care Any changes since last visit?  no   Family History  Problem Relation Age of Onset  . Cancer Brother     Colon Cancer  . Heart disease Brother   . Heart disease Brother   . Asthma Mother    History   Social History  . Marital Status: Married    Spouse Name: N/A    Number of Children: N/A  . Years of Education: N/A   Occupational History  . Retired     Nature conservation officer   Social History Main Topics  . Smoking status: Former Smoker -- 0.50 packs/day for 15 years    Types: Cigarettes    Quit date: 06/12/1988  .  Smokeless tobacco: Never Used  . Alcohol Use: No  . Drug Use: No  . Sexual Activity: None   Other Topics Concern  . None   Social History Narrative   Diet is "good"   Exercise is limited by medical problems   Past Surgical History  Procedure Laterality Date  . Lithotripsy  2007   Past Medical History  Diagnosis Date  . GERD 07/06/2010  . Edema 10/28/2007  . DIABETES MELLITUS, TYPE II 05/04/2007  . HYPERLIPIDEMIA 07/08/2008  . ALLERGIC RHINITIS 07/08/2008  . GLAUCOMA 07/08/2008  . CEREBROVASCULAR ACCIDENT, HX OF 07/08/2008  . OBSTRUCTIVE SLEEP APNEA 03/06/2008  . DIABETIC ULCER, LEFT LEG 08/23/2009  . 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 HYPERTENSION 10/28/2007  . VENOUS INSUFFICIENCY 03/08/2009  . FATTY LIVER DISEASE 05/19/2008  . BENIGN PROSTATIC HYPERTROPHY 07/08/2008  . 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 NEPHROLITHIASIS, HX OF 07/08/2008  . Morbid obesity   . DM nephropathy/sclerosis   . 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 Lumbar spondylosis   . Diarrhea 06/08/2013   BP 137/54  Pulse 89  Resp 14  Ht 5\' 6"  (1.676 m)  Wt 337 lb (152.862 kg)  BMI 54.42 kg/m2  SpO2 89%  Opioid Risk Score:   Fall Risk Score: Moderate Fall Risk (6-13 points) (patient educated handout declined)  Review of Systems  Musculoskeletal: Positive for back pain.  Neurological: Positive for dizziness and numbness.       Tingling  All other systems reviewed and are negative.      Objective:   Physical Exam  Nursing note and vitals reviewed. Constitutional: He is oriented to person, place, and time. He appears well-developed and well-nourished.  Morbid Obese  HENT:  Head: Normocephalic and atraumatic.  Neck: Normal range of motion. Neck supple.  Cardiovascular: Normal rate, regular rhythm and normal heart sounds.   Musculoskeletal: He exhibits edema.  Normal Muscle Bulk and Muscle Testing Reveals: Upper Extremities: Full Rom and Muscle Strength 5/5 Lumbar  Paraspinal Tenderness: L-3- L-5 Lower Back: left Leg: Full ROM and Muscle Strength 5/5 Right Knee flexion produces pain into right knee Motorized wheelchair  Neurological: He is alert and oriented to person, place, and time.  Skin: Skin is warm and dry.  Velcro Wraps to Lower Extremities Lower Extremities: Pitting Edema 2+  Psychiatric: He has a normal mood and affect.          Assessment & Plan:  1. End-stage right knee osteoarthritis: Continue to use Voltaren gel  Refilled: MS contin 15 mg #60--use bid.  Percocet 7.5/325 mg # 90 pills--use every four hours as needed for pain.  2. Morbid obesity with generalized deconditioning: He has silver sneaker membership. [facility] Ulcers [facility] totally.  3. Chronic low back pain/spondylosis: He is using Anaprox as needed. Continue MS contin with prn percocet.  5. Narcotic induced constipation: On Lactulose with good effect.  20 minutes of face to face patient care time was spent during this visit. All questions were encouraged and answered.   F/U in 1 month

## 2013-11-28 ENCOUNTER — Other Ambulatory Visit: Payer: Self-pay | Admitting: Pulmonary Disease

## 2013-11-28 ENCOUNTER — Other Ambulatory Visit: Payer: Self-pay

## 2013-11-28 ENCOUNTER — Other Ambulatory Visit: Payer: Self-pay | Admitting: Endocrinology

## 2013-11-28 MED ORDER — TOPIRAMATE 50 MG PO TABS: 50.0000 mg | ORAL_TABLET | Freq: Two times a day (BID) | ORAL | Status: DC

## 2013-11-28 MED ORDER — DICLOFENAC SODIUM 1 % TD GEL: TRANSDERMAL | Status: DC

## 2013-12-19 ENCOUNTER — Encounter: Payer: Medicare Other | Attending: Physical Medicine and Rehabilitation | Admitting: Registered Nurse

## 2013-12-19 ENCOUNTER — Encounter: Payer: Self-pay | Admitting: Registered Nurse

## 2013-12-19 VITALS — BP 156/67 | HR 91 | Resp 16

## 2013-12-19 DIAGNOSIS — R42 Dizziness and giddiness: Secondary | ICD-10-CM | POA: Diagnosis not present

## 2013-12-19 DIAGNOSIS — E1049 Type 1 diabetes mellitus with other diabetic neurological complication: Secondary | ICD-10-CM

## 2013-12-19 DIAGNOSIS — E119 Type 2 diabetes mellitus without complications: Secondary | ICD-10-CM | POA: Insufficient documentation

## 2013-12-19 DIAGNOSIS — M47817 Spondylosis without myelopathy or radiculopathy, lumbosacral region: Secondary | ICD-10-CM | POA: Insufficient documentation

## 2013-12-19 DIAGNOSIS — M1710 Unilateral primary osteoarthritis, unspecified knee: Secondary | ICD-10-CM | POA: Diagnosis not present

## 2013-12-19 DIAGNOSIS — M6250 Muscle wasting and atrophy, not elsewhere classified, unspecified site: Secondary | ICD-10-CM | POA: Insufficient documentation

## 2013-12-19 DIAGNOSIS — Z79899 Other long term (current) drug therapy: Secondary | ICD-10-CM | POA: Diagnosis not present

## 2013-12-19 DIAGNOSIS — M175 Other unilateral secondary osteoarthritis of knee: Secondary | ICD-10-CM

## 2013-12-19 DIAGNOSIS — Z8673 Personal history of transient ischemic attack (TIA), and cerebral infarction without residual deficits: Secondary | ICD-10-CM | POA: Insufficient documentation

## 2013-12-19 DIAGNOSIS — Z87891 Personal history of nicotine dependence: Secondary | ICD-10-CM | POA: Diagnosis not present

## 2013-12-19 DIAGNOSIS — K5909 Other constipation: Secondary | ICD-10-CM | POA: Insufficient documentation

## 2013-12-19 DIAGNOSIS — Z9981 Dependence on supplemental oxygen: Secondary | ICD-10-CM | POA: Insufficient documentation

## 2013-12-19 DIAGNOSIS — G8929 Other chronic pain: Secondary | ICD-10-CM | POA: Insufficient documentation

## 2013-12-19 DIAGNOSIS — H409 Unspecified glaucoma: Secondary | ICD-10-CM | POA: Diagnosis not present

## 2013-12-19 DIAGNOSIS — R269 Unspecified abnormalities of gait and mobility: Secondary | ICD-10-CM | POA: Diagnosis not present

## 2013-12-19 DIAGNOSIS — M545 Low back pain, unspecified: Secondary | ICD-10-CM

## 2013-12-19 DIAGNOSIS — G609 Hereditary and idiopathic neuropathy, unspecified: Secondary | ICD-10-CM

## 2013-12-19 DIAGNOSIS — M1990 Unspecified osteoarthritis, unspecified site: Secondary | ICD-10-CM

## 2013-12-19 DIAGNOSIS — R2689 Other abnormalities of gait and mobility: Secondary | ICD-10-CM

## 2013-12-19 DIAGNOSIS — E1065 Type 1 diabetes mellitus with hyperglycemia: Secondary | ICD-10-CM

## 2013-12-19 DIAGNOSIS — M1711 Unilateral primary osteoarthritis, right knee: Secondary | ICD-10-CM

## 2013-12-19 DIAGNOSIS — I10 Essential (primary) hypertension: Secondary | ICD-10-CM | POA: Diagnosis not present

## 2013-12-19 DIAGNOSIS — M1731 Unilateral post-traumatic osteoarthritis, right knee: Secondary | ICD-10-CM

## 2013-12-19 DIAGNOSIS — Z5181 Encounter for therapeutic drug level monitoring: Secondary | ICD-10-CM

## 2013-12-19 DIAGNOSIS — IMO0002 Reserved for concepts with insufficient information to code with codable children: Secondary | ICD-10-CM

## 2013-12-19 DIAGNOSIS — E785 Hyperlipidemia, unspecified: Secondary | ICD-10-CM | POA: Diagnosis not present

## 2013-12-19 DIAGNOSIS — G4733 Obstructive sleep apnea (adult) (pediatric): Secondary | ICD-10-CM | POA: Diagnosis not present

## 2013-12-19 DIAGNOSIS — K219 Gastro-esophageal reflux disease without esophagitis: Secondary | ICD-10-CM | POA: Diagnosis not present

## 2013-12-19 MED ORDER — OXYCODONE-ACETAMINOPHEN 7.5-325 MG PO TABS: 1.0000 | ORAL_TABLET | ORAL | Status: DC | PRN

## 2013-12-19 MED ORDER — MORPHINE SULFATE ER 15 MG PO TBCR: 15.0000 mg | EXTENDED_RELEASE_TABLET | Freq: Two times a day (BID) | ORAL | Status: DC

## 2013-12-19 NOTE — Progress Notes (Signed)
Subjective:    Patient ID: Blake Summers, male    DOB: [birth year], 68 y.o.   MRN: [id number]  HPI: Blake Summers is a 68 year old male who returns for follow up for chronic pain and medication refill. He says his pain is located in his lower back and right knee. He rates his pain 7. His current exercise regime is performing stretching exercises. He's not performing his exercises as scheduled. He says he will do better. He will be starting pool therapy after a ramp is built. He arrived to clinic in his motorized wheelchair. He says he uses his walker in his home for short distances. He's not wearing his knee brace today. He has bilateral velcro wraps on to lower extremities.  Wife in room all questions answered.  Pain Inventory Average Pain 8 Pain Right Now 7 My pain is sharp, stabbing and aching  In the last 24 hours, has pain interfered with the following? General activity 9 Relation with others 8 Enjoyment of life 8 What TIME of day is your pain at its worst? all Sleep (in general) Poor  Pain is worse with: walking and standing Pain improves with: rest, heat/ice and medication Relief from Meds: 8  Mobility use a cane use a walker ability to climb steps?  yes do you drive?  yes use a wheelchair needs help with transfers  Function retired I need assistance with the following:  dressing, meal prep, household duties and shopping  Neuro/Psych weakness numbness tingling trouble walking dizziness  Prior Studies Any changes since last visit?  no  Physicians involved in your care Any changes since last visit?  no   Family History  Problem Relation Age of Onset  . Cancer Brother     Colon Cancer  . Heart disease Brother   . Heart disease Brother   . Asthma Mother    History   Social History  . Marital Status: Married    Spouse Name: N/A    Number of Children: N/A  . Years of Education: N/A   Occupational History  . Retired     Nature conservation officeroperations manager    Social History Main Topics  . Smoking status: Former Smoker -- 0.50 packs/day for 15 years    Types: Cigarettes    Quit date: 06/12/1988  . Smokeless tobacco: Never Used  . Alcohol Use: No  . Drug Use: No  . Sexual Activity: None   Other Topics Concern  . None   Social History Narrative   Diet is "good"   Exercise is limited by medical problems   Past Surgical History  Procedure Laterality Date  . Lithotripsy  2007   Past Medical History  Diagnosis Date  . GERD 07/06/2010  . Edema 10/28/2007  . DIABETES MELLITUS, TYPE II 05/04/2007  . HYPERLIPIDEMIA 07/08/2008  . ALLERGIC RHINITIS 07/08/2008  . GLAUCOMA 07/08/2008  . CEREBROVASCULAR ACCIDENT, HX OF 07/08/2008  . OBSTRUCTIVE SLEEP APNEA 03/06/2008  . DIABETIC ULCER, LEFT LEG 08/23/2009  . 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 HYPERTENSION 10/28/2007  . VENOUS INSUFFICIENCY 03/08/2009  . FATTY LIVER DISEASE 05/19/2008  . BENIGN PROSTATIC HYPERTROPHY 07/08/2008  . 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 NEPHROLITHIASIS, HX OF 07/08/2008  . Morbid obesity   . DM nephropathy/sclerosis   . 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 Lumbar spondylosis   . Diarrhea 06/08/2013   BP 156/67  Pulse 91  Resp 16  SpO2 95%  Opioid Risk Score:  Fall Risk Score: Moderate Fall Risk (6-13 points) (previously educated and given handout on fallprevention 07/03/13)  Review of Systems  Musculoskeletal: Positive for gait problem.  Neurological: Positive for dizziness, weakness and numbness.       Tingling  All other systems reviewed and are negative.      Objective:   Physical Exam  Nursing note and vitals reviewed. Constitutional: He is oriented to person, place, and time. He appears well-developed and well-nourished.  HENT:  Head: Normocephalic and atraumatic.  Neck: Normal range of motion. Neck supple.  Cardiovascular: Normal rate and regular rhythm.   Pulmonary/Chest: Effort normal and breath sounds normal.   Musculoskeletal:  Normal Muscle Bulk and Muscle Testing Reveals: Upper Extremities: Full ROM and Muscle Strength 5/5 Lumbar Paraspinal Tenderness: L-3- L-5 Lower Extremities: Full ROM and Muscle Strength 5/5. Bilateral Velcro Wraps intact Arrived in Motorized Wheelchair   Neurological: He is alert and oriented to person, place, and time.  Skin: Skin is warm and dry.  Psychiatric: He has a normal mood and affect.          Assessment & Plan:  1. End-stage right knee osteoarthritis: Continue to use Voltaren gel  Refilled: MS contin 15 mg #60--use bid.  Percocet 7.5/325 mg # 90 pills--use every four hours as needed for pain.  2. Morbid obesity with generalized deconditioning: He has silver sneaker membership. Planning to go to the [facility] after Ulcers have healed totally.  3. Chronic low back pain/spondylosis: He is using Anaprox as needed. Continue MS contin with prn percocet.  5. Narcotic induced constipation: On Lactulose with good effect.   20 minutes of face to face patient care time was spent during this visit. All questions were encouraged and answered.   F/U in 1 month

## 2013-12-23 ENCOUNTER — Ambulatory Visit: Payer: Medicare Other | Admitting: Pulmonary Disease

## 2013-12-25 ENCOUNTER — Telehealth: Payer: Self-pay

## 2013-12-25 NOTE — Telephone Encounter (Signed)
Diabetic Bundle;   mychart message sent to see if Dr Everardo AllEllison is his primary PCP or if he has another PCP to check his lipid panel checked

## 2013-12-29 ENCOUNTER — Other Ambulatory Visit: Payer: Self-pay | Admitting: Endocrinology

## 2014-01-12 ENCOUNTER — Encounter: Payer: Self-pay | Admitting: Pulmonary Disease

## 2014-01-12 ENCOUNTER — Ambulatory Visit (INDEPENDENT_AMBULATORY_CARE_PROVIDER_SITE_OTHER): Payer: Medicare Other | Admitting: Pulmonary Disease

## 2014-01-12 VITALS — BP 110/70 | HR 90 | Temp 98.8°F | Ht 67.0 in | Wt 335.6 lb

## 2014-01-12 DIAGNOSIS — E662 Morbid (severe) obesity with alveolar hypoventilation: Secondary | ICD-10-CM

## 2014-01-12 DIAGNOSIS — J9610 Chronic respiratory failure, unspecified whether with hypoxia or hypercapnia: Secondary | ICD-10-CM

## 2014-01-12 DIAGNOSIS — J9612 Chronic respiratory failure with hypercapnia: Secondary | ICD-10-CM

## 2014-01-12 NOTE — Assessment & Plan Note (Signed)
The patient is doing very well on his bilevel device, and his download shows excellent compliance and good control of his obstructive events. He feels that he is sleeping very well with the device, and does not have significant daytime sleepiness. He has lost 2 pounds since last visit, and I've encouraged him to continue with this. I will see him back in 6 months if he is doing well.

## 2014-01-12 NOTE — Patient Instructions (Signed)
No change in bipap settings, and keep up with mask changes and supplies. Keep working on weight loss over time. followup with me again in 6mos.

## 2014-01-12 NOTE — Progress Notes (Signed)
   Subjective:    Patient ID: Blake Summers, male    DOB: 08/09/1945, 68 y.o.   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 He is maintaining on bilevel at night while sleeping, as well as his supplemental oxygen. He is able to keep the fluid off his lower extremities with his regimen and also his compression stockings. He is having no issues with his mask fit or pressure, and sleeps well with the device. His download shows excellent compliance.   Review of Systems  Constitutional: Negative for fever and unexpected weight change.  HENT: Negative for congestion, dental problem, ear pain, nosebleeds, postnasal drip, rhinorrhea, sinus pressure, sneezing, sore throat and trouble swallowing.   Eyes: Negative for redness and itching.  Respiratory: Positive for shortness of breath. Negative for cough, chest tightness and wheezing.   Cardiovascular: Negative for palpitations and leg swelling.  Gastrointestinal: Negative for nausea and vomiting.  Genitourinary: Negative for dysuria.  Musculoskeletal: Negative for joint swelling.  Skin: Negative for rash.  Neurological: Negative for headaches.  Hematological: Does not bruise/bleed easily.  Psychiatric/Behavioral: Negative for dysphoric mood. The patient is not nervous/anxious.        Objective:   Physical Exam Morbidly obese male in a wheelchair in no acute distress Nose without purulence or discharge noted No skin breakdown or pressure necrosis from the CPAP mask Neck without lymphadenopathy or thyromegaly Chest with minimal basilar crackles, adequate airflow, no wheezing Cardiac exam with regular rate and rhythm but distant heart sounds Lower extremities with edema noted, but wraps and compression stockings in place Alert and oriented, moves all 4 extremities.       Assessment & Plan:

## 2014-01-20 ENCOUNTER — Encounter: Payer: Self-pay | Admitting: Registered Nurse

## 2014-01-20 ENCOUNTER — Encounter: Payer: Medicare Other | Attending: Physical Medicine and Rehabilitation | Admitting: Registered Nurse

## 2014-01-20 VITALS — BP 134/64 | HR 96 | Resp 14 | Ht 67.0 in | Wt 350.0 lb

## 2014-01-20 DIAGNOSIS — R42 Dizziness and giddiness: Secondary | ICD-10-CM | POA: Insufficient documentation

## 2014-01-20 DIAGNOSIS — M1731 Unilateral post-traumatic osteoarthritis, right knee: Secondary | ICD-10-CM

## 2014-01-20 DIAGNOSIS — E785 Hyperlipidemia, unspecified: Secondary | ICD-10-CM | POA: Insufficient documentation

## 2014-01-20 DIAGNOSIS — E1049 Type 1 diabetes mellitus with other diabetic neurological complication: Secondary | ICD-10-CM

## 2014-01-20 DIAGNOSIS — K219 Gastro-esophageal reflux disease without esophagitis: Secondary | ICD-10-CM | POA: Diagnosis not present

## 2014-01-20 DIAGNOSIS — Z9981 Dependence on supplemental oxygen: Secondary | ICD-10-CM | POA: Insufficient documentation

## 2014-01-20 DIAGNOSIS — G8929 Other chronic pain: Secondary | ICD-10-CM | POA: Insufficient documentation

## 2014-01-20 DIAGNOSIS — I10 Essential (primary) hypertension: Secondary | ICD-10-CM | POA: Insufficient documentation

## 2014-01-20 DIAGNOSIS — M545 Low back pain, unspecified: Secondary | ICD-10-CM

## 2014-01-20 DIAGNOSIS — K5909 Other constipation: Secondary | ICD-10-CM | POA: Insufficient documentation

## 2014-01-20 DIAGNOSIS — Z87891 Personal history of nicotine dependence: Secondary | ICD-10-CM | POA: Insufficient documentation

## 2014-01-20 DIAGNOSIS — M175 Other unilateral secondary osteoarthritis of knee: Secondary | ICD-10-CM

## 2014-01-20 DIAGNOSIS — Z8673 Personal history of transient ischemic attack (TIA), and cerebral infarction without residual deficits: Secondary | ICD-10-CM | POA: Diagnosis not present

## 2014-01-20 DIAGNOSIS — E119 Type 2 diabetes mellitus without complications: Secondary | ICD-10-CM | POA: Diagnosis not present

## 2014-01-20 DIAGNOSIS — M1710 Unilateral primary osteoarthritis, unspecified knee: Secondary | ICD-10-CM | POA: Insufficient documentation

## 2014-01-20 DIAGNOSIS — E1065 Type 1 diabetes mellitus with hyperglycemia: Secondary | ICD-10-CM

## 2014-01-20 DIAGNOSIS — M6250 Muscle wasting and atrophy, not elsewhere classified, unspecified site: Secondary | ICD-10-CM | POA: Insufficient documentation

## 2014-01-20 DIAGNOSIS — M47817 Spondylosis without myelopathy or radiculopathy, lumbosacral region: Secondary | ICD-10-CM | POA: Insufficient documentation

## 2014-01-20 DIAGNOSIS — G4733 Obstructive sleep apnea (adult) (pediatric): Secondary | ICD-10-CM | POA: Insufficient documentation

## 2014-01-20 DIAGNOSIS — R269 Unspecified abnormalities of gait and mobility: Secondary | ICD-10-CM | POA: Diagnosis not present

## 2014-01-20 DIAGNOSIS — Z5181 Encounter for therapeutic drug level monitoring: Secondary | ICD-10-CM

## 2014-01-20 DIAGNOSIS — Z79899 Other long term (current) drug therapy: Secondary | ICD-10-CM | POA: Insufficient documentation

## 2014-01-20 DIAGNOSIS — G609 Hereditary and idiopathic neuropathy, unspecified: Secondary | ICD-10-CM

## 2014-01-20 DIAGNOSIS — H409 Unspecified glaucoma: Secondary | ICD-10-CM | POA: Diagnosis not present

## 2014-01-20 MED ORDER — OXYCODONE-ACETAMINOPHEN 7.5-325 MG PO TABS: 1.0000 | ORAL_TABLET | ORAL | Status: DC | PRN

## 2014-01-20 MED ORDER — MORPHINE SULFATE ER 15 MG PO TBCR: 15.0000 mg | EXTENDED_RELEASE_TABLET | Freq: Two times a day (BID) | ORAL | Status: DC

## 2014-01-20 NOTE — Progress Notes (Signed)
Subjective:    Patient ID: Blake SalenJoseph L Goodie, male    DOB: 11-18-1945, 68 y.o.   MRN: [id number]  HPI: Blake Summers is a 68 year old male who returns for follow up for chronic pain and medication refill. He says his pain is located in his lower back and right knee. He rates his pain 7. His current exercise regime is performing stretching exercises occasionally. He has been encouraged to develop a scheduled routine. He verbalizes understanding. He is still working on his pool and hope it will be finish soon. Then he will start pool therapy, he states. He arrived to clinic in his motorized wheelchair.  He has bilateral velcro wraps on to lower extremities.   Pain Inventory Average Pain 8 Pain Right Now 7 My pain is sharp, stabbing, tingling and aching  In the last 24 hours, has pain interfered with the following? General activity 8 Relation with others 9 Enjoyment of life 8 What TIME of day is your pain at its worst? daytime Sleep (in general) Poor  Pain is worse with: walking and standing Pain improves with: rest, heat/ice, medication and injections Relief from Meds: 7  Mobility walk with assistance use a cane use a walker ability to climb steps?  no do you drive?  yes use a wheelchair needs help with transfers  Function retired I need assistance with the following:  dressing, meal prep, household duties and shopping  Neuro/Psych weakness numbness tingling trouble walking dizziness  Prior Studies Any changes since last visit?  no  Physicians involved in your care Any changes since last visit?  no   Family History  Problem Relation Age of Onset  . Cancer Brother     Colon Cancer  . Heart disease Brother   . Heart disease Brother   . Asthma Mother    History   Social History  . Marital Status: Married    Spouse Name: N/A    Number of Children: N/A  . Years of Education: N/A   Occupational History  . Retired     Nature conservation officeroperations manager   Social  History Main Topics  . Smoking status: Former Smoker -- 0.50 packs/day for 15 years    Types: Cigarettes    Quit date: 06/12/1988  . Smokeless tobacco: Never Used  . Alcohol Use: No  . Drug Use: No  . Sexual Activity: None   Other Topics Concern  . None   Social History Narrative   Diet is "good"   Exercise is limited by medical problems   Past Surgical History  Procedure Laterality Date  . Lithotripsy  2007   Past Medical History  Diagnosis Date  . GERD 07/06/2010  . Edema 10/28/2007  . DIABETES MELLITUS, TYPE II 05/04/2007  . HYPERLIPIDEMIA 07/08/2008  . ALLERGIC RHINITIS 07/08/2008  . GLAUCOMA 07/08/2008  . CEREBROVASCULAR ACCIDENT, HX OF 07/08/2008  . OBSTRUCTIVE SLEEP APNEA 03/06/2008  . DIABETIC ULCER, LEFT LEG 08/23/2009  . 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 HYPERTENSION 10/28/2007  . VENOUS INSUFFICIENCY 03/08/2009  . FATTY LIVER DISEASE 05/19/2008  . BENIGN PROSTATIC HYPERTROPHY 07/08/2008  . 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 NEPHROLITHIASIS, HX OF 07/08/2008  . Morbid obesity   . DM nephropathy/sclerosis   . 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 Lumbar spondylosis   . Diarrhea 06/08/2013   BP 134/64  Pulse 96  Resp 14  Ht 5\' 7"  (1.702 m)  Wt 350 lb (158.759 kg)  BMI 54.80  kg/m2  SpO2 91%  Opioid Risk Score:   Fall Risk Score: Moderate Fall Risk (6-13 points) (pt educated, brochure declined)   Review of Systems  Musculoskeletal: Positive for back pain and gait problem.  Neurological: Positive for dizziness, weakness and numbness.       Tingling  All other systems reviewed and are negative.      Objective:   Physical Exam  Constitutional: He is oriented to person, place, and time. He appears well-developed and well-nourished.  HENT:  Head: Normocephalic and atraumatic.  Neck: Normal range of motion. Neck supple.  Cardiovascular: Normal rate, regular rhythm and normal heart sounds.   Pulmonary/Chest: Effort normal and breath  sounds normal.  Musculoskeletal:  Normal Muscle Bulk and Muscle testing Reveals: Upper Extremities: Full ROM and Muscle Strength 5/5 Lumbar Paraspinal Tenderness: L-3- L-5 Lower Extremities: Full ROM and Muscle Strength 5/5 Right Leg Flexion Produces Pain into Patella Bilateral Velcro Wraps Intact Arrived in Motorized Wheelchair  Neurological: He is alert and oriented to person, place, and time.  Skin: Skin is warm and dry.  Psychiatric: He has a normal mood and affect.          Assessment & Plan:  1. End-stage right knee osteoarthritis: Continue to use Voltaren gel  Refilled: MS contin 15 mg #60--use bid.  Percocet 7.5/325 mg # 90 pills--use every four hours as needed for pain.  2. Morbid obesity with generalized deconditioning: Encouraged to increase Activity. 3. Chronic low back pain/spondylosis: He is using Anaprox as needed. Continue MS contin with prn percocet.  5. Narcotic induced constipation: On Lactulose with good effect.   20 minutes of face to face patient care time was spent during this visit. All questions were encouraged and answered.   F/U in 1 month

## 2014-01-28 ENCOUNTER — Other Ambulatory Visit: Payer: Self-pay | Admitting: Endocrinology

## 2014-02-18 ENCOUNTER — Encounter: Payer: Self-pay | Admitting: Registered Nurse

## 2014-02-18 ENCOUNTER — Encounter: Payer: Medicare Other | Attending: Physical Medicine and Rehabilitation | Admitting: Registered Nurse

## 2014-02-18 VITALS — BP 141/59 | HR 87 | Resp 18

## 2014-02-18 DIAGNOSIS — I10 Essential (primary) hypertension: Secondary | ICD-10-CM | POA: Insufficient documentation

## 2014-02-18 DIAGNOSIS — E1065 Type 1 diabetes mellitus with hyperglycemia: Secondary | ICD-10-CM

## 2014-02-18 DIAGNOSIS — G8929 Other chronic pain: Secondary | ICD-10-CM | POA: Insufficient documentation

## 2014-02-18 DIAGNOSIS — M47817 Spondylosis without myelopathy or radiculopathy, lumbosacral region: Secondary | ICD-10-CM | POA: Insufficient documentation

## 2014-02-18 DIAGNOSIS — Z9981 Dependence on supplemental oxygen: Secondary | ICD-10-CM | POA: Insufficient documentation

## 2014-02-18 DIAGNOSIS — K219 Gastro-esophageal reflux disease without esophagitis: Secondary | ICD-10-CM | POA: Insufficient documentation

## 2014-02-18 DIAGNOSIS — R42 Dizziness and giddiness: Secondary | ICD-10-CM | POA: Insufficient documentation

## 2014-02-18 DIAGNOSIS — K5909 Other constipation: Secondary | ICD-10-CM | POA: Diagnosis not present

## 2014-02-18 DIAGNOSIS — M545 Low back pain, unspecified: Secondary | ICD-10-CM

## 2014-02-18 DIAGNOSIS — G609 Hereditary and idiopathic neuropathy, unspecified: Secondary | ICD-10-CM

## 2014-02-18 DIAGNOSIS — H409 Unspecified glaucoma: Secondary | ICD-10-CM | POA: Insufficient documentation

## 2014-02-18 DIAGNOSIS — Z79899 Other long term (current) drug therapy: Secondary | ICD-10-CM

## 2014-02-18 DIAGNOSIS — M1711 Unilateral primary osteoarthritis, right knee: Secondary | ICD-10-CM

## 2014-02-18 DIAGNOSIS — Z87891 Personal history of nicotine dependence: Secondary | ICD-10-CM | POA: Diagnosis not present

## 2014-02-18 DIAGNOSIS — R269 Unspecified abnormalities of gait and mobility: Secondary | ICD-10-CM | POA: Diagnosis not present

## 2014-02-18 DIAGNOSIS — Z8673 Personal history of transient ischemic attack (TIA), and cerebral infarction without residual deficits: Secondary | ICD-10-CM | POA: Diagnosis not present

## 2014-02-18 DIAGNOSIS — M1710 Unilateral primary osteoarthritis, unspecified knee: Secondary | ICD-10-CM | POA: Insufficient documentation

## 2014-02-18 DIAGNOSIS — E785 Hyperlipidemia, unspecified: Secondary | ICD-10-CM | POA: Diagnosis not present

## 2014-02-18 DIAGNOSIS — M6250 Muscle wasting and atrophy, not elsewhere classified, unspecified site: Secondary | ICD-10-CM | POA: Diagnosis not present

## 2014-02-18 DIAGNOSIS — G4733 Obstructive sleep apnea (adult) (pediatric): Secondary | ICD-10-CM | POA: Insufficient documentation

## 2014-02-18 DIAGNOSIS — IMO0002 Reserved for concepts with insufficient information to code with codable children: Secondary | ICD-10-CM

## 2014-02-18 DIAGNOSIS — E119 Type 2 diabetes mellitus without complications: Secondary | ICD-10-CM | POA: Diagnosis not present

## 2014-02-18 DIAGNOSIS — E1049 Type 1 diabetes mellitus with other diabetic neurological complication: Secondary | ICD-10-CM

## 2014-02-18 DIAGNOSIS — Z5181 Encounter for therapeutic drug level monitoring: Secondary | ICD-10-CM

## 2014-02-18 MED ORDER — OXYCODONE-ACETAMINOPHEN 7.5-325 MG PO TABS: 1.0000 | ORAL_TABLET | ORAL | Status: DC | PRN

## 2014-02-18 MED ORDER — MORPHINE SULFATE ER 15 MG PO TBCR: 15.0000 mg | EXTENDED_RELEASE_TABLET | Freq: Two times a day (BID) | ORAL | Status: DC

## 2014-02-18 NOTE — Progress Notes (Signed)
Subjective:    Patient ID: Blake Summers, male    DOB: May 14, 1946, 68 y.o.   MRN: [id number]  HPI: [name] is a 68 year old male who returns for follow up for chronic pain and medication refill. He says his pain is located in his lower back and right knee. He rates his pain 7. He's not consistent with his exercise regime.He has been encouraged to increase his activity. Their having a hoyer lift placed on the pool deck today. He's hoping to start pool therapy this week weather permitting.He arrived to clinic in his motorized wheelchair. He says he uses his walker in his home for short distances.  Has bilateral velcro wraps to lower extremities. Wife in room all questions answered.   Pain Inventory Average Pain 8 Pain Right Now 7 My pain is sharp, stabbing and aching  In the last 24 hours, has pain interfered with the following? General activity 8 Relation with others 9 Enjoyment of life 8 What TIME of day is your pain at its worst? daytime Sleep (in general) Poor  Pain is worse with: walking and standing Pain improves with: rest, heat/ice, medication and injections Relief from Meds: 7  Mobility walk with assistance use a cane use a walker do you drive?  yes use a wheelchair needs help with transfers  Function retired I need assistance with the following:  dressing, meal prep, household duties and shopping  Neuro/Psych weakness numbness tingling trouble walking dizziness  Prior Studies Any changes since last visit?  no  Physicians involved in your care Any changes since last visit?  no   Family History  Problem Relation Age of Onset  . Cancer Brother     Colon Cancer  . Heart disease Brother   . Heart disease Brother   . Asthma Mother    History   Social History  . Marital Status: Married    Spouse Name: N/A    Number of Children: N/A  . Years of Education: N/A   Occupational History  . Retired     Nature conservation officer   Social History  Main Topics  . Smoking status: Former Smoker -- 0.50 packs/day for 15 years    Types: Cigarettes    Quit date: 06/12/1988  . Smokeless tobacco: Never Used  . Alcohol Use: No  . Drug Use: No  . Sexual Activity: None   Other Topics Concern  . None   Social History Narrative   Diet is "good"   Exercise is limited by medical problems   Past Surgical History  Procedure Laterality Date  . Lithotripsy  2007   Past Medical History  Diagnosis Date  . GERD 07/06/2010  . Edema 10/28/2007  . DIABETES MELLITUS, TYPE II 05/04/2007  . HYPERLIPIDEMIA 07/08/2008  . ALLERGIC RHINITIS 07/08/2008  . GLAUCOMA 07/08/2008  . CEREBROVASCULAR ACCIDENT, HX OF 07/08/2008  . OBSTRUCTIVE SLEEP APNEA 03/06/2008  . DIABETIC ULCER, LEFT LEG 08/23/2009  . 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 HYPERTENSION 10/28/2007  . VENOUS INSUFFICIENCY 03/08/2009  . FATTY LIVER DISEASE 05/19/2008  . BENIGN PROSTATIC HYPERTROPHY 07/08/2008  . 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 NEPHROLITHIASIS, HX OF 07/08/2008  . Morbid obesity   . DM nephropathy/sclerosis   . 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 Lumbar spondylosis   . Diarrhea 06/08/2013   BP 141/59  Pulse 87  Resp 18  SpO2 92%  Opioid Risk Score:   Fall Risk Score: Moderate Fall Risk (  6-13 points) (previoulsy educated and given handout 07/03/13) Review of Systems  Musculoskeletal: Positive for gait problem.  Neurological: Positive for dizziness, weakness and numbness.       Tingling  All other systems reviewed and are negative.      Objective:   Physical Exam  Nursing note and vitals reviewed. Constitutional: He is oriented to person, place, and time. He appears well-developed and well-nourished.  Morbid Obesity  HENT:  Head: Normocephalic and atraumatic.  Neck: Normal range of motion. Neck supple.  Cardiovascular: Normal rate and regular rhythm.   Pulmonary/Chest: Effort normal and breath sounds normal.  Musculoskeletal:  Normal  Muscle Bulk and Muscle testing Reveals: Upper extremities: Full ROM and Muscle Strength 5/5 Back without Spinal or Paraspinal Tenderness Lower Extremities: Full ROM and Muscle strength 5/5 Arrived in Motorized Wheelchair   Neurological: He is alert and oriented to person, place, and time.  Skin: Skin is warm and dry.  Psychiatric: He has a normal mood and affect.          Assessment & Plan:  1. End-stage right knee osteoarthritis: Continue to use Voltaren gel  Refilled: MS contin 15 mg #60--use bid.  Percocet 7.5/325 mg # 90 pills--use every four hours as needed for pain.  2. Morbid obesity with generalized deconditioning: Starting Pool Therapy Soon.  3. Chronic low back pain/spondylosis: He is using Anaprox as needed. Continue MS contin with prn percocet.  5. Narcotic induced constipation: On Lactulose with good effect.   20 minutes of face to face patient care time was spent during this visit. All questions were encouraged and answered.   F/U in 1 month

## 2014-02-24 ENCOUNTER — Ambulatory Visit: Payer: Medicare Other | Admitting: Endocrinology

## 2014-02-25 ENCOUNTER — Encounter: Payer: Self-pay | Admitting: Endocrinology

## 2014-02-25 ENCOUNTER — Ambulatory Visit (INDEPENDENT_AMBULATORY_CARE_PROVIDER_SITE_OTHER): Payer: Medicare Other | Admitting: Endocrinology

## 2014-02-25 VITALS — BP 114/64 | HR 84 | Temp 97.9°F | Ht 67.0 in | Wt 341.0 lb

## 2014-02-25 DIAGNOSIS — I10 Essential (primary) hypertension: Secondary | ICD-10-CM

## 2014-02-25 DIAGNOSIS — Z0000 Encounter for general adult medical examination without abnormal findings: Secondary | ICD-10-CM | POA: Insufficient documentation

## 2014-02-25 DIAGNOSIS — Z125 Encounter for screening for malignant neoplasm of prostate: Secondary | ICD-10-CM

## 2014-02-25 DIAGNOSIS — R7989 Other specified abnormal findings of blood chemistry: Secondary | ICD-10-CM

## 2014-02-25 DIAGNOSIS — E785 Hyperlipidemia, unspecified: Secondary | ICD-10-CM

## 2014-02-25 DIAGNOSIS — E1065 Type 1 diabetes mellitus with hyperglycemia: Principal | ICD-10-CM

## 2014-02-25 DIAGNOSIS — Z23 Encounter for immunization: Secondary | ICD-10-CM

## 2014-02-25 DIAGNOSIS — E1049 Type 1 diabetes mellitus with other diabetic neurological complication: Secondary | ICD-10-CM

## 2014-02-25 DIAGNOSIS — Z79899 Other long term (current) drug therapy: Secondary | ICD-10-CM

## 2014-02-25 DIAGNOSIS — E230 Hypopituitarism: Secondary | ICD-10-CM

## 2014-02-25 MED ORDER — CLOTRIMAZOLE 10 MG MT TROC: 10.0000 mg | Freq: Every day | OROMUCOSAL | Status: DC

## 2014-02-25 NOTE — Progress Notes (Signed)
Subjective:    Patient ID: Blake Summers, male    DOB: 1945-11-01, 68 y.o.   MRN: [id number]  HPI Pt is here for regular wellness examination, and is feeling pretty well in general, and says chronic med probs are stable, except as noted below Past Medical History  Diagnosis Date  . GERD 07/06/2010  . Edema 10/28/2007  . DIABETES MELLITUS, TYPE II 05/04/2007  . HYPERLIPIDEMIA 07/08/2008  . ALLERGIC RHINITIS 07/08/2008  . GLAUCOMA 07/08/2008  . CEREBROVASCULAR ACCIDENT, HX OF 07/08/2008  . OBSTRUCTIVE SLEEP APNEA 03/06/2008  . DIABETIC ULCER, LEFT LEG 08/23/2009  . 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 HYPERTENSION 10/28/2007  . VENOUS INSUFFICIENCY 03/08/2009  . FATTY LIVER DISEASE 05/19/2008  . BENIGN PROSTATIC HYPERTROPHY 07/08/2008  . 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 NEPHROLITHIASIS, HX OF 07/08/2008  . Morbid obesity   . DM nephropathy/sclerosis   . 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 Lumbar spondylosis   . Diarrhea 06/08/2013    Past Surgical History  Procedure Laterality Date  . Lithotripsy  2007    History   Social History  . Marital Status: Married    Spouse Name: N/A    Number of Children: N/A  . Years of Education: N/A   Occupational History  . Retired     Sales promotion account executive   Social History Main Topics  . Smoking status: Former Smoker -- 0.50 packs/day for 15 years    Types: Cigarettes    Quit date: 06/12/1988  . Smokeless tobacco: Never Used  . Alcohol Use: No  . Drug Use: No  . Sexual Activity: Not on file   Other Topics Concern  . Not on file   Social History Narrative   Diet is "good"   Exercise is limited by medical problems    Current Outpatient Prescriptions on File Prior to Visit  Medication Sig Dispense Refill  . allopurinol (ZYLOPRIM) 300 MG tablet TAKE 1 TABLET BY MOUTH EVERY DAY  30 tablet  2  . Blood Glucose Monitoring Suppl (BAYER CONTOUR NEXT MONITOR) W/DEVICE KIT 1 Device by Does not apply route once.  1  kit  0  . brimonidine (ALPHAGAN) 0.15 % ophthalmic solution as directed.      Marland Kitchen CLARITIN-D 12 HOUR 5-120 MG per tablet TAKE 1 TABLET BY MOUTH 2 TIMES A DAY  60 tablet  2  . clobetasol ointment (TEMOVATE) 0.05 % Apply topically 3 (three) times daily.  60 g  4  . DEXILANT 60 MG capsule TAKE 1 CAPSULE BY MOUTH DAILY  30 capsule  6  . diclofenac sodium (VOLTAREN) 1 % GEL APPLY TOPICALLY 4 TIMES DAILY AS PHYSICIAN INSTRUCTED  200 g  3  . diltiazem (CARDIZEM CD) 180 MG 24 hr capsule TAKE 2 CAPSULES BY MOUTH DAILY  60 capsule  2  . dipyridamole-aspirin (AGGRENOX) 200-25 MG per 12 hr capsule TAKE 1 CAPSULE BY MOUTH 2 TIMES DAILY  60 capsule  2  . docusate sodium (COLACE) 100 MG capsule TAKE 1 CAPSULE BY MOUTH 2 TIMES DAILY  60 capsule  2  . dorzolamide-timolol (COSOPT) 22.3-6.8 MG/ML ophthalmic solution as directed.      . fluconazole (DIFLUCAN) 150 MG tablet Take 1 tablet (150 mg total) by mouth as directed. Take 1 tab daily X 3 days, then 1 tab po q week X 3 weeks  DO NOT take statin medication on the days you take this medication  6 tablet  0  . furosemide (LASIX)  80 MG tablet TAKE ONE (1) TABLET EACH DAY  30 tablet  2  . glucose blood (BAYER CONTOUR NEXT TEST) test strip 1 each by Other route 2 (two) times daily. And lancets 250.01  100 each  12  . insulin aspart (NOVOLOG) 100 UNIT/ML injection 3x a day (just before each meal) 80-120-140 units      . Insulin NPH, Human,, Isophane, (HUMULIN N KWIKPEN) 100 UNIT/ML Kiwkpen INJECT 70 UNITS SUBCUTANEOUSLY EACH NIGHT AT BEDTIME      . ketoconazole (NIZORAL) 2 % shampoo as directed.      . lactulose (CHRONULAC) 10 GM/15ML solution Take 45 mLs (30 g total) by mouth 2 (two) times daily.  960 mL  11  . latanoprost (XALATAN) 0.005 % ophthalmic solution Place 1 drop into both eyes at bedtime.  2.5 mL  4  . lidocaine (LIDODERM) 5 % Place 1-3 patches onto the skin daily. Remove & Discard patch within 12 hours or as directed by MD  90 patch  2  .  losartan-hydrochlorothiazide (HYZAAR) 100-25 MG per tablet TAKE 1 TABLET BY MOUTH EVERY DAY  30 tablet  2  . lovastatin (MEVACOR) 40 MG tablet TAKE 1 TABLET BY MOUTH AT BEDTIME FOR CHOLESTEROL  60 tablet  4  . methocarbamol (ROBAXIN) 500 MG tablet TAKE 1 TABLET BY MOUTH 3 TIMES DAILY AS NEEDED FOR CRAMPING  90 tablet  3  . MICROLET LANCETS MISC       . morphine (MS CONTIN) 15 MG 12 hr tablet Take 1 tablet (15 mg total) by mouth 2 (two) times daily.  60 tablet  0  . naproxen sodium (ANAPROX) 220 MG tablet 2 tablets by mouth once daily as needed for pain       . NOVOFINE 30G X 8 MM MISC USE 4 TIMES A DAY  100 each  3  . NOVOLOG FLEXPEN 100 UNIT/ML FlexPen INJECT 3 TIMES PER DAY JUST BEFORE EACH MEAL (90-120-160 UNITS)  135 mL  2  . oxyCODONE-acetaminophen (PERCOCET) 7.5-325 MG per tablet Take 1 tablet by mouth every 4 (four) hours as needed for pain.  90 tablet  0  . phenol (CHLORASEPTIC) 1.4 % LIQD Use as directed 1 spray in the mouth or throat as needed.  118 mL  0  . polyethylene glycol powder (GLYCOLAX/MIRALAX) powder DISSOLVE 1 CAPFUL (17 GRAMS) IN 8 TO 10 OUNCES OF WATER DAILY  527 g  4  . potassium chloride SA (K-DUR,KLOR-CON) 20 MEQ tablet TAKE 1 TABLET BY MOUTH 2 TIMES A DAY  60 tablet  2  . PROVENTIL HFA 108 (90 BASE) MCG/ACT inhaler ONE PUFF EVERY 4 HOURS AS NEEDED  6.7 g  5  . ranitidine (ZANTAC) 150 MG tablet TAKE 2 TABLETS BY MOUTH EVERY NIGHT AT BEDTIME  60 tablet  2  . topiramate (TOPAMAX) 50 MG tablet Take 1 tablet (50 mg total) by mouth 2 (two) times daily.  60 tablet  3  . zolpidem (AMBIEN) 10 MG tablet Take 1 tablet (10 mg total) by mouth at bedtime as needed.  30 tablet  5   No current facility-administered medications on file prior to visit.    Allergies  Allergen Reactions  . Neosporin [Neomycin-Polymyxin-Gramicidin]     Family History  Problem Relation Age of Onset  . Cancer Brother     Colon Cancer  . Heart disease Brother   . Heart disease Brother   . Asthma  Mother     BP 114/64  Pulse 84  Temp(Src) 97.9 F (36.6 C)  Ht _0  (1.702 m)  Wt 341 lb (154.677 kg)  BMI 53.40 kg/m2  SpO2 92%   Review of Systems  Constitutional: Negative for fever and unexpected weight change.  HENT: Negative for hearing loss.   Eyes: Negative for visual disturbance.  Respiratory:       No change in chronic sob  Cardiovascular: Negative for chest pain.  Gastrointestinal: Negative for anal bleeding.  Endocrine: Negative for cold intolerance.  Genitourinary: Negative for hematuria.  Musculoskeletal: Negative for neck pain.  Skin: Negative for rash.  Allergic/Immunologic: Negative for environmental allergies.  Neurological: Negative for syncope.  Psychiatric/Behavioral: Negative for dysphoric mood.       Objective:   Physical Exam VS: see vs page GEN: no distress.  Morbid obesity.  Has 02 on.  In wheelchair.   HEAD: head: no deformity eyes: no periorbital swelling, no proptosis external nose and ears are normal. NECK: supple, thyroid is not enlarged CHEST WALL: no deformity LUNGS: clear to auscultation BREASTS:  No gynecomastia CV: reg rate and rhythm, no murmur ABD: abdomen is soft, nontender.  no hepatosplenomegaly.  not distended.  no hernia RECTAL/PROSTATE: declined MUSCULOSKELETAL: muscle bulk and strength are grossly normal.  no obvious joint swelling.  gait is normal and steady EXTEMITIES: no deformity.  no ulcer on the feet.  feet are of normal color and temp.  no edema PULSES: dorsalis pedis intact bilat.  no carotid bruit NEURO:  cn 2-12 grossly intact.   readily moves all 4's.  sensation is intact to touch on the feet SKIN:  Normal texture and temperature.  No rash or suspicious lesion is visible.   NODES:  None palpable at the neck.   PSYCH: alert, well-oriented.  Does not appear anxious nor depressed.        Assessment & Plan:  Wellness visit today, with problems stable, except as noted. please consider these measures for your  health:  minimize alcohol.  do not use tobacco products.  have a colonoscopy at least every 10 years from age 67.  keep firearms safely stored.  always use seat belts.  have working smoke alarms in your home.  see an eye doctor and dentist regularly.  never drive under the influence of alcohol or drugs (including prescription drugs).  those with fair skin should take precautions against the sun.    SEPARATE EVALUATION FOLLOWS--EACH PROBLEM HERE IS NEW, NOT RESPONDING TO TREATMENT, OR POSES SIGNIFICANT RISK TO THE PATIENT'S HEALTH: HISTORY OF THE PRESENT ILLNESS: Pt returns for f/u of insulin-requiring DM (dx'ed 1990; he has moderate neuropathy of the lower extremities; he has associated mild CAD, CVA, and foot ulcer; he has been on insulin since 1999; he has never had pancreatitis, severe hypoglycemia or DKA; he takes multiple daily injections; he was turned down for weight-loss surgery, due to comorbidities).  he brings a record of his cbg's which i have reviewed today, which is scanned into the record. He has slight intermittent ulcers of the mouth, and assoc pain.  PAST MEDICAL HISTORY reviewed and up to date today. REVIEW OF SYSTEMS: PHYSICAL EXAMINATION: Mouth: no ulcer seen today. Pulses: dorsalis pedis intact bilat.  Feet: no deformity. Trace bilat leg edema.  Skin: no ulcer on the feet. Normal temp of the feet, but there is (chronic) erythema and varicosities.   Neuro: sensation is intact to touch on the feet, but decreased from normal.  LAB/XRAY RESULTS: Lab Results  Component Value Date   CREATININE 0.9 02/25/2014  BUN 16 02/25/2014   NA 138 02/25/2014   K 3.3* 02/25/2014   CL 92* 02/25/2014   CO2 38* 02/25/2014    Lab Results  Component Value Date   HGBA1C 7.8* 02/25/2014  IMPRESSION: DM: mild exacerbation.  therapy limited by variable cbg's.  i'll do the best i can. Oral ulcers, new, possibly due to thrush Hypokalemia, persistent PLAN: See instruction page

## 2014-02-25 NOTE — Patient Instructions (Addendum)
check your blood sugar twice a day.  vary the time of day when you check, between before the 3 meals, and at bedtime.  also check if you have symptoms of your blood sugar being too high or too low.  please keep a record of the readings and bring it to your next appointment here.  please call us sooner if your blood sugar goes below 70, or if you have a lot of readings over 200.   Please come back for a follow-up appointment in 3 months.    blood tests are requested for you today.  We'll contact you with results.   pleaswe reduce breakfast novolog to 80 units.   i have sent a prescription to your pharmacy, for mouth lozenges. Please let me know if you decide to the the colonoscopy.

## 2014-02-26 LAB — CBC WITH DIFFERENTIAL/PLATELET
BASOS PCT: 0.2 % (ref 0.0–3.0)
Basophils Absolute: 0 10*3/uL (ref 0.0–0.1)
Eosinophils Absolute: 0.6 10*3/uL (ref 0.0–0.7)
Eosinophils Relative: 4.9 % (ref 0.0–5.0)
HCT: 43.3 % (ref 39.0–52.0)
HEMOGLOBIN: 13.9 g/dL (ref 13.0–17.0)
LYMPHS ABS: 0.9 10*3/uL (ref 0.7–4.0)
Lymphocytes Relative: 7.2 % — ABNORMAL LOW (ref 12.0–46.0)
MCHC: 32 g/dL (ref 30.0–36.0)
MCV: 87.7 fl (ref 78.0–100.0)
MONO ABS: 0.6 10*3/uL (ref 0.1–1.0)
Monocytes Relative: 4.7 % (ref 3.0–12.0)
NEUTROS ABS: 10 10*3/uL — AB (ref 1.4–7.7)
NEUTROS PCT: 83 % — AB (ref 43.0–77.0)
Platelets: 293 10*3/uL (ref 150.0–400.0)
RBC: 4.94 Mil/uL (ref 4.22–5.81)
RDW: 16.1 % — ABNORMAL HIGH (ref 11.5–15.5)
WBC: 12.1 10*3/uL — AB (ref 4.0–10.5)

## 2014-02-26 LAB — URINALYSIS, ROUTINE W REFLEX MICROSCOPIC
Bilirubin Urine: NEGATIVE
HGB URINE DIPSTICK: NEGATIVE
KETONES UR: NEGATIVE
Leukocytes, UA: NEGATIVE
Nitrite: NEGATIVE
Specific Gravity, Urine: <=1.005 — AB (ref 1.000–1.030)
TOTAL PROTEIN, URINE-UPE24: NEGATIVE
URINE GLUCOSE: NEGATIVE
Urobilinogen, UA: 0.2 (ref 0.0–1.0)
pH: 6.5 (ref 5.0–8.0)

## 2014-02-26 LAB — HEMOGLOBIN A1C: HEMOGLOBIN A1C: 7.8 % — AB (ref 4.6–6.5)

## 2014-02-26 LAB — HEPATIC FUNCTION PANEL
ALK PHOS: 93 U/L (ref 39–117)
ALT: 22 U/L (ref 0–53)
AST: 19 U/L (ref 0–37)
Albumin: 3.5 g/dL (ref 3.5–5.2)
BILIRUBIN DIRECT: 0.1 mg/dL (ref 0.0–0.3)
BILIRUBIN TOTAL: 0.4 mg/dL (ref 0.2–1.2)
Total Protein: 6.5 g/dL (ref 6.0–8.3)

## 2014-02-26 LAB — MICROALBUMIN / CREATININE URINE RATIO
CREATININE, U: 24.2 mg/dL
MICROALB/CREAT RATIO: 1.2 mg/g (ref 0.0–30.0)
Microalb, Ur: 0.3 mg/dL (ref 0.0–1.9)

## 2014-02-26 LAB — BASIC METABOLIC PANEL
BUN: 16 mg/dL (ref 6–23)
CHLORIDE: 92 meq/L — AB (ref 96–112)
CO2: 38 mEq/L — ABNORMAL HIGH (ref 19–32)
Calcium: 8.9 mg/dL (ref 8.4–10.5)
Creatinine, Ser: 0.9 mg/dL (ref 0.4–1.5)
GFR: 84.55 mL/min (ref >60.00)
Glucose, Bld: 123 mg/dL — ABNORMAL HIGH (ref 70–99)
Potassium: 3.3 mEq/L — ABNORMAL LOW (ref 3.5–5.1)
Sodium: 138 mEq/L (ref 135–145)

## 2014-02-26 LAB — LIPID PANEL
CHOLESTEROL: 122 mg/dL (ref 0–200)
HDL: 33.1 mg/dL — AB (ref >39.00)
LDL Cholesterol: 65 mg/dL (ref 0–99)
NonHDL: 88.9
Total CHOL/HDL Ratio: 4
Triglycerides: 120 mg/dL (ref 0.0–149.0)
VLDL: 24 mg/dL (ref 0.0–40.0)

## 2014-02-26 LAB — TSH: TSH: 1.1 u[IU]/mL (ref 0.35–4.50)

## 2014-02-26 LAB — PSA: PSA: 0.24 ng/mL (ref 0.10–4.00)

## 2014-02-26 MED ORDER — POTASSIUM CHLORIDE ER 10 MEQ PO TBCR: 10.0000 meq | EXTENDED_RELEASE_TABLET | Freq: Every day | ORAL | Status: DC

## 2014-02-27 ENCOUNTER — Other Ambulatory Visit: Payer: Self-pay | Admitting: *Deleted

## 2014-02-27 ENCOUNTER — Other Ambulatory Visit: Payer: Self-pay | Admitting: Endocrinology

## 2014-02-27 MED ORDER — TOPIRAMATE 50 MG PO TABS: 50.0000 mg | ORAL_TABLET | Freq: Two times a day (BID) | ORAL | Status: DC

## 2014-03-04 ENCOUNTER — Other Ambulatory Visit: Payer: Self-pay | Admitting: Endocrinology

## 2014-03-20 ENCOUNTER — Encounter: Payer: Medicare Other | Admitting: Registered Nurse

## 2014-03-24 ENCOUNTER — Encounter: Payer: Medicare Other | Attending: Physical Medicine and Rehabilitation | Admitting: Registered Nurse

## 2014-03-24 ENCOUNTER — Encounter: Payer: Self-pay | Admitting: Registered Nurse

## 2014-03-24 VITALS — BP 138/63 | HR 93 | Resp 18

## 2014-03-24 DIAGNOSIS — Z5181 Encounter for therapeutic drug level monitoring: Secondary | ICD-10-CM | POA: Diagnosis present

## 2014-03-24 DIAGNOSIS — Z79899 Other long term (current) drug therapy: Secondary | ICD-10-CM

## 2014-03-24 DIAGNOSIS — M545 Low back pain: Secondary | ICD-10-CM | POA: Diagnosis not present

## 2014-03-24 DIAGNOSIS — M1711 Unilateral primary osteoarthritis, right knee: Secondary | ICD-10-CM

## 2014-03-24 DIAGNOSIS — M179 Osteoarthritis of knee, unspecified: Secondary | ICD-10-CM

## 2014-03-24 MED ORDER — OXYCODONE-ACETAMINOPHEN 7.5-325 MG PO TABS: 1.0000 | ORAL_TABLET | ORAL | Status: DC | PRN

## 2014-03-24 MED ORDER — MORPHINE SULFATE ER 15 MG PO TBCR: 15.0000 mg | EXTENDED_RELEASE_TABLET | Freq: Two times a day (BID) | ORAL | Status: DC

## 2014-03-24 NOTE — Progress Notes (Signed)
Subjective:    Patient ID: Blake Summers Nader, male    DOB: 04/29/1946, 68 y.o.   MRN: [id number]  HPI: [name] Mi is a 68 year old male who returns for follow up for chronic pain and medication refill. He says his pain is located in his lower back and right knee. He rates his pain 7. He was able to get into the pool twice in September. He arrived to clinic in his motorized wheelchair. He says he uses his walker in his home for short distances. Has bilateral velcro wraps to lower extremities. Wife in room all questions answered.  He has his right knee orthosis on, will be going to [facility] for left knee brace adjustments.  Pain Inventory Average Pain 8 Pain Right Now 7 My pain is constant, sharp, stabbing and aching  In the last 24 hours, has pain interfered with the following? General activity 9 Relation with others 9 Enjoyment of life 8 What TIME of day is your pain at its worst? daytime Sleep (in general) Poor  Pain is worse with: walking and standing Pain improves with: rest, heat/ice, medication and injections Relief from Meds: 5  Mobility ability to climb steps?  no do you drive?  no use a wheelchair  Function disabled: date disabled .  Neuro/Psych numbness tingling trouble walking dizziness  Prior Studies Any changes since last visit?  no  Physicians involved in your care Any changes since last visit?  no   Family History  Problem Relation Age of Onset  . Cancer Brother     Colon Cancer  . Heart disease Brother   . Heart disease Brother   . Asthma Mother    History   Social History  . Marital Status: Married    Spouse Name: N/A    Number of Children: N/A  . Years of Education: N/A   Occupational History  . Retired     Nature conservation officeroperations manager   Social History Main Topics  . Smoking status: Former Smoker -- 0.50 packs/day for 15 years    Types: Cigarettes    Quit date: 06/12/1988  . Smokeless tobacco: Never Used  . Alcohol Use: No   . Drug Use: No  . Sexual Activity: None   Other Topics Concern  . None   Social History Narrative   Diet is "good"   Exercise is limited by medical problems   Past Surgical History  Procedure Laterality Date  . Lithotripsy  2007   Past Medical History  Diagnosis Date  . GERD 07/06/2010  . Edema 10/28/2007  . DIABETES MELLITUS, TYPE II 05/04/2007  . HYPERLIPIDEMIA 07/08/2008  . ALLERGIC RHINITIS 07/08/2008  . GLAUCOMA 07/08/2008  . CEREBROVASCULAR ACCIDENT, HX OF 07/08/2008  . OBSTRUCTIVE SLEEP APNEA 03/06/2008  . DIABETIC ULCER, LEFT LEG 08/23/2009  . 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 HYPERTENSION 10/28/2007  . VENOUS INSUFFICIENCY 03/08/2009  . FATTY LIVER DISEASE 05/19/2008  . BENIGN PROSTATIC HYPERTROPHY 07/08/2008  . 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 NEPHROLITHIASIS, HX OF 07/08/2008  . Morbid obesity   . DM nephropathy/sclerosis   . 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 Lumbar spondylosis   . Diarrhea 06/08/2013   BP 138/63  Pulse 93  Resp 18  SpO2 93%  Opioid Risk Score:   Fall Risk Score:    Review of Systems     Objective:   Physical Exam  Nursing note and vitals reviewed. Constitutional: He is oriented to person,  place, and time. He appears well-developed and well-nourished.  HENT:  Head: Normocephalic and atraumatic.  Neck: Normal range of motion. Neck supple.  Cardiovascular: Normal rate and regular rhythm.   Pulmonary/Chest: Effort normal and breath sounds normal.  Musculoskeletal:  Normal Muscle Bulk and Muscle testing Reveals: Upper extremities: Full ROM and Muscle Strength 5/5 Lumbar Paraspinal Tenderness: Summers-3- Summers-5 Lower Extremities: Left Full ROM and Muscle Strength 5/5 Decreased ROM and Muscle Strength 4/5 Right Lower Extremity Flexion Produces Pain into Patella/ Swelling Noted. Right Knee Brace Intact Arrived in Motorized Wheelchair   Neurological: He is alert and oriented to person, place, and time.  Skin: Skin  is warm and dry.  Psychiatric: He has a normal mood and affect.          Assessment & Plan:  1. End-stage right knee osteoarthritis: Continue to use Voltaren gel  Refilled: MS contin 15 mg #60--use bid.  Percocet 7.5/325 mg # 90 pills--use every four hours as needed for pain.  2. Morbid obesity with generalized deconditioning:  Needs to increase his activity Level  3. Chronic low back pain/spondylosis: He is using Anaprox as needed. Continue MS contin with prn percocet.  5. Narcotic induced constipation: On Lactulose with good effect.  20 minutes of face to face patient care time was spent during this visit. All questions were encouraged and answered.  F/U in 1 month

## 2014-03-31 ENCOUNTER — Other Ambulatory Visit: Payer: Self-pay | Admitting: Endocrinology

## 2014-04-01 ENCOUNTER — Telehealth: Payer: Self-pay | Admitting: *Deleted

## 2014-04-01 MED ORDER — DICLOFENAC SODIUM 1 % TD GEL: TRANSDERMAL | Status: DC

## 2014-04-01 NOTE — Telephone Encounter (Signed)
recvd fax refill request for Voltaren Gel 1% - sent in electronically

## 2014-04-23 ENCOUNTER — Encounter: Payer: Self-pay | Admitting: Registered Nurse

## 2014-04-23 ENCOUNTER — Encounter: Payer: Medicare Other | Attending: Physical Medicine and Rehabilitation | Admitting: Registered Nurse

## 2014-04-23 VITALS — BP 148/55 | HR 94 | Resp 14

## 2014-04-23 DIAGNOSIS — M1711 Unilateral primary osteoarthritis, right knee: Secondary | ICD-10-CM

## 2014-04-23 DIAGNOSIS — M545 Low back pain: Secondary | ICD-10-CM | POA: Diagnosis not present

## 2014-04-23 DIAGNOSIS — Z79899 Other long term (current) drug therapy: Secondary | ICD-10-CM | POA: Diagnosis present

## 2014-04-23 DIAGNOSIS — Z5181 Encounter for therapeutic drug level monitoring: Secondary | ICD-10-CM | POA: Insufficient documentation

## 2014-04-23 DIAGNOSIS — M179 Osteoarthritis of knee, unspecified: Secondary | ICD-10-CM

## 2014-04-23 MED ORDER — MORPHINE SULFATE ER 15 MG PO TBCR
15.0000 mg | EXTENDED_RELEASE_TABLET | Freq: Two times a day (BID) | ORAL | Status: DC
Start: 2014-04-23 — End: 2014-05-22

## 2014-04-23 MED ORDER — OXYCODONE-ACETAMINOPHEN 7.5-325 MG PO TABS: 1.0000 | ORAL_TABLET | ORAL | Status: DC | PRN

## 2014-04-23 NOTE — Progress Notes (Signed)
Subjective:    Patient ID: Blake Summers, male    DOB: 1945/11/21, 68 y.o.   MRN: [id number]  HPI: Blake Summers is a 68 year old male who returns for follow up for chronic pain and medication refill. He says his pain is located in his bilateral hands, lower back, right knee, and left heel. He rates his pain 8. His current exercise regime is performing stretching exercises. He arrived to clinic in his motorized wheelchair. Has bilateral velcro wraps to lower extremities. Wife in room all questions answered.  Hasn't went to  [facility] for left knee brace adjustments at this time, says he will call them this week.  Pain Inventory Average Pain 8 Pain Right Now 8 My pain is sharp, stabbing and aching  In the last 24 hours, has pain interfered with the following? General activity 8 Relation with others 8 Enjoyment of life 9 What TIME of day is your pain at its worst? daytime Sleep (in general) Poor  Pain is worse with: walking and standing Pain improves with: rest, heat/ice, medication and injections Relief from Meds: 6  Mobility walk with assistance use a walker ability to climb steps?  yes do you drive?  yes use a wheelchair needs help with transfers  Function retired I need assistance with the following:  dressing, meal prep, household duties and shopping  Neuro/Psych numbness tingling trouble walking dizziness  Prior Studies Any changes since last visit?  no  Physicians involved in your care Any changes since last visit?  no   Family History  Problem Relation Age of Onset  . Cancer Brother     Colon Cancer  . Heart disease Brother   . Heart disease Brother   . Asthma Mother    History   Social History  . Marital Status: Married    Spouse Name: N/A    Number of Children: N/A  . Years of Education: N/A   Occupational History  . Retired     Nature conservation officeroperations manager   Social History Main Topics  . Smoking status: Former Smoker -- 0.50  packs/day for 15 years    Types: Cigarettes    Quit date: 06/12/1988  . Smokeless tobacco: Never Used  . Alcohol Use: No  . Drug Use: No  . Sexual Activity: None   Other Topics Concern  . None   Social History Narrative   Diet is "good"   Exercise is limited by medical problems   Past Surgical History  Procedure Laterality Date  . Lithotripsy  2007   Past Medical History  Diagnosis Date  . GERD 07/06/2010  . Edema 10/28/2007  . DIABETES MELLITUS, TYPE II 05/04/2007  . HYPERLIPIDEMIA 07/08/2008  . ALLERGIC RHINITIS 07/08/2008  . GLAUCOMA 07/08/2008  . CEREBROVASCULAR ACCIDENT, HX OF 07/08/2008  . OBSTRUCTIVE SLEEP APNEA 03/06/2008  . DIABETIC ULCER, LEFT LEG 08/23/2009  . 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 HYPERTENSION 10/28/2007  . VENOUS INSUFFICIENCY 03/08/2009  . FATTY LIVER DISEASE 05/19/2008  . BENIGN PROSTATIC HYPERTROPHY 07/08/2008  . 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 NEPHROLITHIASIS, HX OF 07/08/2008  . Morbid obesity   . DM nephropathy/sclerosis   . 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 Lumbar spondylosis   . Diarrhea 06/08/2013   BP 148/55 mmHg  Pulse 94  Resp 14  SpO2 86%  Opioid Risk Score:   Fall Risk Score: Moderate Fall Risk (6-13 points) 2 Review of Systems  Respiratory: Positive for shortness  of breath.   Musculoskeletal: Positive for back pain, joint swelling and arthralgias.       Objective:   Physical Exam  Constitutional: He is oriented to person, place, and time. He appears well-developed and well-nourished.  HENT:  Head: Normocephalic and atraumatic.  Neck: Normal range of motion. Neck supple.  Cardiovascular: Normal rate and regular rhythm.   Pulmonary/Chest: Effort normal and breath sounds normal.  Musculoskeletal:  Normal Muscle Bulk and Muscle Testing Reveals: Upper Extremities: Full ROM and Muscle Strength 5/5 Back without Spinal or Paraspinal Tenderness Lower Extremities:Left: Full ROM and Muscle Strength  5/5 Right Lower Extremity Flexion Produces Pain into Right Knee Arrived in Motorized wheelchair  Neurological: He is alert and oriented to person, place, and time.  Skin: Skin is warm and dry.  Psychiatric: He has a normal mood and affect.          Assessment & Plan:  1. End-stage right knee osteoarthritis: Continue to use Voltaren gel  Refilled: MS contin 15 mg #60--use bid.  Percocet 7.5/325 mg # 90 pills--use every four hours as needed for pain.  2. Morbid obesity with generalized deconditioning:  Encourage to increase his activity Level  3. Chronic low back pain/spondylosis: He is using Anaprox as needed. Continue MS contin with prn percocet.  5. Narcotic induced constipation: On Lactulose with good effect.  20 minutes of face to face patient care time was spent during this visit. All questions were encouraged and answered.  F/U in 1 month

## 2014-04-28 ENCOUNTER — Ambulatory Visit: Payer: Medicare Other | Admitting: Registered Nurse

## 2014-04-30 ENCOUNTER — Other Ambulatory Visit: Payer: Self-pay | Admitting: Endocrinology

## 2014-05-20 ENCOUNTER — Encounter: Payer: Medicare Other | Admitting: Registered Nurse

## 2014-05-22 ENCOUNTER — Encounter: Payer: Self-pay | Admitting: Physical Medicine & Rehabilitation

## 2014-05-22 ENCOUNTER — Encounter: Payer: Medicare Other | Attending: Physical Medicine and Rehabilitation | Admitting: Physical Medicine & Rehabilitation

## 2014-05-22 VITALS — BP 131/64 | HR 92 | Resp 14

## 2014-05-22 DIAGNOSIS — M1711 Unilateral primary osteoarthritis, right knee: Secondary | ICD-10-CM

## 2014-05-22 DIAGNOSIS — G4733 Obstructive sleep apnea (adult) (pediatric): Secondary | ICD-10-CM

## 2014-05-22 DIAGNOSIS — G609 Hereditary and idiopathic neuropathy, unspecified: Secondary | ICD-10-CM

## 2014-05-22 DIAGNOSIS — Z79899 Other long term (current) drug therapy: Secondary | ICD-10-CM | POA: Diagnosis present

## 2014-05-22 DIAGNOSIS — Z5181 Encounter for therapeutic drug level monitoring: Secondary | ICD-10-CM | POA: Insufficient documentation

## 2014-05-22 DIAGNOSIS — M545 Low back pain: Secondary | ICD-10-CM | POA: Diagnosis present

## 2014-05-22 DIAGNOSIS — R2689 Other abnormalities of gait and mobility: Secondary | ICD-10-CM

## 2014-05-22 DIAGNOSIS — M1712 Unilateral primary osteoarthritis, left knee: Secondary | ICD-10-CM | POA: Insufficient documentation

## 2014-05-22 MED ORDER — OXYCODONE-ACETAMINOPHEN 7.5-325 MG PO TABS: 1.0000 | ORAL_TABLET | ORAL | Status: DC | PRN

## 2014-05-22 MED ORDER — MORPHINE SULFATE ER 15 MG PO TBCR
15.0000 mg | EXTENDED_RELEASE_TABLET | Freq: Two times a day (BID) | ORAL | Status: DC
Start: 2014-05-22 — End: 2014-06-19

## 2014-05-22 NOTE — Patient Instructions (Signed)
YOU NEED TO BEGIN WORKING ON YOUR EXERCISE, NOW!!!! DON'T WAIT UNTIL THE SUMMER!   HAPPY HOLIDAYS!!!!!

## 2014-05-22 NOTE — Progress Notes (Signed)
Subjective:    Patient ID: Blake Summers, male    DOB: [birth year], 68 y.o.   MRN: [id number]  HPI   [name] is back regarding his chronic pain. He states that his pain levels have been fairly consistent. They had a new deck built off the back of their house. It was just finished in September. He plans to use it to help access his pool and for exercise "once it's summer time.". He continues on oxygen for his chronic lung disease. He may have lost some weight.   He received the knee braces and the right one is fitting after some adjustments. He's having more problems with the left brace.  He continues on his medications as prescribed. No problems have been experienced.    Pain Inventory Average Pain 8 Pain Right Now 8 My pain is sharp, stabbing and aching  In the last 24 hours, has pain interfered with the following? General activity 8 Relation with others 8 Enjoyment of life 9 What TIME of day is your pain at its worst? daytime Sleep (in general) Poor  Pain is worse with: walking and unsure Pain improves with: rest, heat/ice, medication and injections Relief from Meds: 7  Mobility walk with assistance use a cane use a walker ability to climb steps?  yes do you drive?  yes use a wheelchair needs help with transfers  Function retired I need assistance with the following:  meal prep, household duties and shopping  Neuro/Psych bowel control problems numbness tingling trouble walking dizziness  Prior Studies Any changes since last visit?  no  Physicians involved in your care Any changes since last visit?  no   Family History  Problem Relation Age of Onset  . Cancer Brother     Colon Cancer  . Heart disease Brother   . Heart disease Brother   . Asthma Mother    History   Social History  . Marital Status: Married    Spouse Name: N/A    Number of Children: N/A  . Years of Education: N/A   Occupational History  . Retired     Nature conservation officeroperations manager    Social History Main Topics  . Smoking status: Former Smoker -- 0.50 packs/day for 15 years    Types: Cigarettes    Quit date: 06/12/1988  . Smokeless tobacco: Never Used  . Alcohol Use: No  . Drug Use: No  . Sexual Activity: None   Other Topics Concern  . None   Social History Narrative   Diet is "good"   Exercise is limited by medical problems   Past Surgical History  Procedure Laterality Date  . Lithotripsy  2007   Past Medical History  Diagnosis Date  . GERD 07/06/2010  . Edema 10/28/2007  . DIABETES MELLITUS, TYPE II 05/04/2007  . HYPERLIPIDEMIA 07/08/2008  . ALLERGIC RHINITIS 07/08/2008  . GLAUCOMA 07/08/2008  . CEREBROVASCULAR ACCIDENT, HX OF 07/08/2008  . OBSTRUCTIVE SLEEP APNEA 03/06/2008  . DIABETIC ULCER, LEFT LEG 08/23/2009  . 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 HYPERTENSION 10/28/2007  . VENOUS INSUFFICIENCY 03/08/2009  . FATTY LIVER DISEASE 05/19/2008  . BENIGN PROSTATIC HYPERTROPHY 07/08/2008  . 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 NEPHROLITHIASIS, HX OF 07/08/2008  . Morbid obesity   . DM nephropathy/sclerosis   . 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 Lumbar spondylosis   . Diarrhea 06/08/2013   BP 131/64 mmHg  Pulse 92  Resp 14  SpO2 92%  Opioid Risk  Score:   Fall Risk Score: Moderate Fall Risk (6-13 points) Review of Systems  Musculoskeletal: Positive for gait problem.  Neurological: Positive for dizziness and numbness.       Tingling  All other systems reviewed and are negative.      Objective:   Physical Exam  Nursing note and vitals reviewed.  Constitutional: He is oriented to person, place, and time. He appears well-developed and well-nourished.  Looks bright and in no distress  HENT:  Head: Normocephalic and atraumatic.  Eyes: Conjunctivae are normal. Pupils are equal, round, and reactive to light.  Neck: Normal range of motion. Neck supple.  Cardiovascular: Normal rate and regular rhythm.  Pulmonary/Chest:  Effort normal and breath sounds normal. No respiratory distress. He has no wheezes.  Abdominal: Soft. Bowel sounds are normal. He exhibits no distension. There is no tenderness.  Musculoskeletal:  No pedal edema noted. Bilateral  compressive wraps--minimal edema appreciated. ROM right knee improved with minimal lag and no pain with movement.   Symmetric normal motor tone is noted throughout. Normal muscle bulk. Muscle testing reveals 5/5 muscle strength of the upper extremity, and 4/5 of the lower extremity.   Back without spinal or paraspinal tenderness.  Neurological: He is alert and oriented to person, place, and time.  Skin: Skin is warm and dry.  Psychiatric: He has a normal mood and affect. His behavior is normal. Thought content normal.   Assessment & Plan:   1. End-stage right knee osteoarthritis: Continue to use lidocaine patches.  Refilled: MS contin 15 mg #60--use bid.  Percocet 7.5/325 mg # 90 pills--use every four hours as needed for pain.  -challenged him to work on gait and exercise at home. He has ramps and therapeutic steps build into the deck currently---not doing anything yet.  2. Morbid obesity with generalized deconditioning---w/c bound essentially  3. Chronic low back pain/spondylosis: He is using robaxin 2-3 times a day to augment his pain regimen.  -consider further imaging if we want to do anything more interventional.  4. Encouraged him to follow up with the orthotist about the left-sided knee brace to see if we can get it fitting.   5. Narcotic induced constipation:miralax, fiber  30 minutes of face to face patient care time were spent during this visit. All questions were encouraged and answered. Follow up in one month with NP .

## 2014-05-27 ENCOUNTER — Telehealth: Payer: Self-pay | Admitting: Endocrinology

## 2014-05-27 ENCOUNTER — Ambulatory Visit (INDEPENDENT_AMBULATORY_CARE_PROVIDER_SITE_OTHER): Payer: Medicare Other | Admitting: Endocrinology

## 2014-05-27 ENCOUNTER — Encounter: Payer: Self-pay | Admitting: Endocrinology

## 2014-05-27 VITALS — BP 134/88 | HR 72 | Temp 98.2°F | Wt 344.0 lb

## 2014-05-27 DIAGNOSIS — E1041 Type 1 diabetes mellitus with diabetic mononeuropathy: Secondary | ICD-10-CM

## 2014-05-27 DIAGNOSIS — R200 Anesthesia of skin: Secondary | ICD-10-CM

## 2014-05-27 DIAGNOSIS — E876 Hypokalemia: Secondary | ICD-10-CM

## 2014-05-27 DIAGNOSIS — E1049 Type 1 diabetes mellitus with other diabetic neurological complication: Secondary | ICD-10-CM

## 2014-05-27 DIAGNOSIS — E1065 Type 1 diabetes mellitus with hyperglycemia: Principal | ICD-10-CM

## 2014-05-27 DIAGNOSIS — IMO0002 Reserved for concepts with insufficient information to code with codable children: Secondary | ICD-10-CM

## 2014-05-27 LAB — VITAMIN B12: Vitamin B-12: 190 pg/mL — ABNORMAL LOW (ref 211–911)

## 2014-05-27 LAB — BASIC METABOLIC PANEL
BUN: 20 mg/dL (ref 6–23)
CALCIUM: 8.7 mg/dL (ref 8.4–10.5)
CO2: 33 meq/L — AB (ref 19–32)
CREATININE: 1 mg/dL (ref 0.4–1.5)
Chloride: 96 mEq/L (ref 96–112)
GFR: 78.67 mL/min (ref >60.00)
GLUCOSE: 142 mg/dL — AB (ref 70–99)
Potassium: 3.4 mEq/L — ABNORMAL LOW (ref 3.5–5.1)
Sodium: 137 mEq/L (ref 135–145)

## 2014-05-27 LAB — HEMOGLOBIN A1C: Hgb A1c MFr Bld: 8.4 % — ABNORMAL HIGH (ref 4.6–6.5)

## 2014-05-27 MED ORDER — INSULIN ISOPHANE HUMAN 100 UNIT/ML KWIKPEN: 60.0000 [IU] | PEN_INJECTOR | Freq: Every day | SUBCUTANEOUS | Status: DC

## 2014-05-27 MED ORDER — POTASSIUM CHLORIDE CRYS ER 20 MEQ PO TBCR: 20.0000 meq | EXTENDED_RELEASE_TABLET | Freq: Every day | ORAL | Status: DC

## 2014-05-27 NOTE — Progress Notes (Signed)
Subjective:    Patient ID: Blake Summers, male    DOB: [birth year], 68 y.o.   MRN: [id number]  HPI Pt returns for f/u of insulin-requiring DM (dx'ed 1990; he has moderate neuropathy of the lower extremities; he has associated mild CAD, CVA, and foot ulcer; he has been on insulin since 1999; he has never had pancreatitis, severe hypoglycemia or DKA; he takes multiple daily injections; he was turned down for weight-loss surgery, due to comorbidities).    he brings a record of his cbg's which i have reviewed today.  It varies from 50-400, but most are 100's.  It is in general higher as the day goes on.   Past Medical History  Diagnosis Date  . GERD 07/06/2010  . Edema 10/28/2007  . DIABETES MELLITUS, TYPE II 05/04/2007  . HYPERLIPIDEMIA 07/08/2008  . ALLERGIC RHINITIS 07/08/2008  . GLAUCOMA 07/08/2008  . CEREBROVASCULAR ACCIDENT, HX OF 07/08/2008  . OBSTRUCTIVE SLEEP APNEA 03/06/2008  . DIABETIC ULCER, LEFT LEG 08/23/2009  . 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 HYPERTENSION 10/28/2007  . VENOUS INSUFFICIENCY 03/08/2009  . FATTY LIVER DISEASE 05/19/2008  . BENIGN PROSTATIC HYPERTROPHY 07/08/2008  . 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 NEPHROLITHIASIS, HX OF 07/08/2008  . Morbid obesity   . DM nephropathy/sclerosis   . 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 Lumbar spondylosis   . Diarrhea 06/08/2013    Past Surgical History  Procedure Laterality Date  . Lithotripsy  2007    History   Social History  . Marital Status: Married    Spouse Name: N/A    Number of Children: N/A  . Years of Education: N/A   Occupational History  . Retired     Sales promotion account executive   Social History Main Topics  . Smoking status: Former Smoker -- 0.50 packs/day for 15 years    Types: Cigarettes    Quit date: 06/12/1988  . Smokeless tobacco: Never Used  . Alcohol Use: No  . Drug Use: No  . Sexual Activity: Not on file   Other Topics Concern  . Not on file   Social History  Narrative   Diet is "good"   Exercise is limited by medical problems    Current Outpatient Prescriptions on File Prior to Visit  Medication Sig Dispense Refill  . allopurinol (ZYLOPRIM) 300 MG tablet TAKE 1 TABLET BY MOUTH EVERY DAY 30 tablet 2  . Blood Glucose Monitoring Suppl (BAYER CONTOUR NEXT MONITOR) W/DEVICE KIT 1 Device by Does not apply route once. 1 kit 0  . brimonidine (ALPHAGAN) 0.15 % ophthalmic solution as directed.    Marland Kitchen CLARITIN-D 12 HOUR 5-120 MG per tablet TAKE 1 TABLET BY MOUTH 2 TIMES A DAY 60 tablet 1  . clobetasol ointment (TEMOVATE) 0.05 % Apply topically 3 (three) times daily. 60 g 4  . clotrimazole (MYCELEX) 10 MG troche Take 1 tablet (10 mg total) by mouth 5 (five) times daily. 35 tablet 2  . DEXILANT 60 MG capsule TAKE 1 CAPSULE BY MOUTH DAILY 30 capsule 6  . diclofenac sodium (VOLTAREN) 1 % GEL APPLY TOPICALLY 4 TIMES DAILY AS PHYSICIAN INSTRUCTED 200 g 3  . diltiazem (CARDIZEM CD) 180 MG 24 hr capsule TAKE 2 CAPSULES BY MOUTH DAILY 60 capsule 1  . dipyridamole-aspirin (AGGRENOX) 200-25 MG per 12 hr capsule TAKE 1 CAPSULE BY MOUTH 2 TIMES DAILY 60 capsule 1  . docusate sodium (COLACE) 100 MG capsule TAKE 1 CAPSULE BY MOUTH 2 TIMES DAILY 60  capsule 1  . dorzolamide-timolol (COSOPT) 22.3-6.8 MG/ML ophthalmic solution as directed.    . fluconazole (DIFLUCAN) 150 MG tablet Take 1 tablet (150 mg total) by mouth as directed. Take 1 tab daily X 3 days, then 1 tab po q week X 3 weeks  DO NOT take statin medication on the days you take this medication 6 tablet 0  . furosemide (LASIX) 80 MG tablet TAKE 1 TABLET BY MOUTH EVERY DAY 30 tablet 1  . glucose blood (BAYER CONTOUR NEXT TEST) test strip 1 each by Other route 2 (two) times daily. And lancets 250.01 100 each 12  . insulin aspart (NOVOLOG) 100 UNIT/ML injection 3x a day (just before each meal) 80-130-150 units    . ketoconazole (NIZORAL) 2 % shampoo as directed.    . lactulose (CHRONULAC) 10 GM/15ML solution Take 45  mLs (30 g total) by mouth 2 (two) times daily. 960 mL 11  . latanoprost (XALATAN) 0.005 % ophthalmic solution Place 1 drop into both eyes at bedtime. 2.5 mL 4  . lidocaine (LIDODERM) 5 % Place 1-3 patches onto the skin daily. Remove & Discard patch within 12 hours or as directed by MD 90 patch 2  . losartan-hydrochlorothiazide (HYZAAR) 100-25 MG per tablet TAKE 1 TABLET BY MOUTH EVERY DAY 30 tablet 2  . lovastatin (MEVACOR) 40 MG tablet TAKE 1 TABLET BY MOUTH AT BEDTIME FOR CHOLESTEROL 60 tablet 4  . methocarbamol (ROBAXIN) 500 MG tablet TAKE 1 TABLET BY MOUTH 3 TIMES DAILY AS NEEDED FOR CRAMPING 90 tablet 11  . MICROLET LANCETS MISC     . morphine (MS CONTIN) 15 MG 12 hr tablet Take 1 tablet (15 mg total) by mouth 2 (two) times daily. 60 tablet 0  . naproxen sodium (ANAPROX) 220 MG tablet 2 tablets by mouth once daily as needed for pain     . NOVOFINE 30G X 8 MM MISC USE 4 TIMES A DAY 100 each 3  . NOVOLOG FLEXPEN 100 UNIT/ML FlexPen INJECT 3 TIMES PER DAY JUST BEFORE EACH MEAL (90-120-160 UNITS) 135 mL 1  . oxyCODONE-acetaminophen (PERCOCET) 7.5-325 MG per tablet Take 1 tablet by mouth every 4 (four) hours as needed for pain. 90 tablet 0  . phenol (CHLORASEPTIC) 1.4 % LIQD Use as directed 1 spray in the mouth or throat as needed. 118 mL 0  . polyethylene glycol powder (GLYCOLAX/MIRALAX) powder DISSOLVE 1 CAPFUL (17GM) IN 8 TO 10 OUNCES OF WATER DAILY 527 g 1  . PROVENTIL HFA 108 (90 BASE) MCG/ACT inhaler ONE PUFF EVERY 4 HOURS AS NEEDED 6.7 g 5  . ranitidine (ZANTAC) 150 MG tablet TAKE 2 TABLETS BY MOUTH EVERY NIGHT AT BEDTIME 60 tablet 1  . topiramate (TOPAMAX) 50 MG tablet Take 1 tablet (50 mg total) by mouth 2 (two) times daily. 60 tablet 3  . zolpidem (AMBIEN) 10 MG tablet Take 1 tablet (10 mg total) by mouth at bedtime as needed. 30 tablet 5   No current facility-administered medications on file prior to visit.    Allergies  Allergen Reactions  . Neosporin  [Neomycin-Polymyxin-Gramicidin]     Family History  Problem Relation Age of Onset  . Cancer Brother     Colon Cancer  . Heart disease Brother   . Heart disease Brother   . Asthma Mother     BP 134/88 mmHg  Pulse 72  Temp(Src) 98.2 F (36.8 C) (Oral)  Wt 344 lb (156.037 kg)  SpO2 93%     Review of Systems He  denies LOC.  He has numbness of the hands, also.      Objective:   Physical Exam VITAL SIGNS:  See vs page GENERAL: no distress Pulses: dorsalis pedis intact bilat.  Feet: no deformity. Trace bilat leg edema.  Skin: no ulcer on the feet. Normal temp of the feet, but there is (chronic) erythema and varicosities.    Neuro: sensation is intact to touch on the feet, but decreased from normal.     Lab Results  Component Value Date   HGBA1C 8.4* 05/27/2014   b-12=190    Assessment & Plan:  DM: moderate exacerbation Numbness of the hands, new. B-12 deficiency, new  Patient is advised the following: Patient Instructions  check your blood sugar twice a day.  vary the time of day when you check, between before the 3 meals, and at bedtime.  also check if you have symptoms of your blood sugar being too high or too low.  please keep a record of the readings and bring it to your next appointment here.  please call us sooner if your blood sugar goes below 70, or if you have a lot of readings over 200.   Please come back for a follow-up appointment in 3 months.     blood tests are requested for you today.  We'll contact you with results.   Please reduce the NPH to 60 units at bedtime.  please increase novolog to 3x a day (just before each meal) 80-130-150 units.  Please call if you want a test for carpal tunnel syndrome.     addendum: pt advised to start B-12 injections

## 2014-05-27 NOTE — Patient Instructions (Addendum)
check your blood sugar twice a day.  vary the time of day when you check, between before the 3 meals, and at bedtime.  also check if you have symptoms of your blood sugar being too high or too low.  please keep a record of the readings and bring it to your next appointment here.  please call us sooner if your blood sugar goes below 70, or if you have a lot of readings over 200.   Please come back for a follow-up appointment in 3 months.     blood tests are requested for you today.  We'll contact you with results.   Please reduce the NPH to 60 units at bedtime.  please increase novolog to 3x a day (just before each meal) 80-130-150 units.  Please call if you want a test for carpal tunnel syndrome.

## 2014-05-30 ENCOUNTER — Other Ambulatory Visit: Payer: Self-pay | Admitting: Pulmonary Disease

## 2014-05-30 ENCOUNTER — Other Ambulatory Visit: Payer: Self-pay | Admitting: Endocrinology

## 2014-06-01 ENCOUNTER — Other Ambulatory Visit: Payer: Self-pay | Admitting: Endocrinology

## 2014-06-11 ENCOUNTER — Telehealth: Payer: Self-pay | Admitting: Endocrinology

## 2014-06-11 DIAGNOSIS — R200 Anesthesia of skin: Secondary | ICD-10-CM

## 2014-06-11 NOTE — Telephone Encounter (Signed)
Pt would like to have test done to see if he has carpal tunnel please

## 2014-06-15 NOTE — Telephone Encounter (Signed)
See below and please advise, Thanks!  

## 2014-06-15 NOTE — Telephone Encounter (Signed)
Ok, you will receive a phone call, about a day and time for an appointment 

## 2014-06-16 NOTE — Telephone Encounter (Signed)
Pt advised of note below and voiced understanding.  

## 2014-06-19 ENCOUNTER — Encounter: Payer: Self-pay | Admitting: Physical Medicine & Rehabilitation

## 2014-06-19 ENCOUNTER — Encounter: Payer: Medicare Other | Attending: Physical Medicine and Rehabilitation | Admitting: Physical Medicine & Rehabilitation

## 2014-06-19 ENCOUNTER — Other Ambulatory Visit: Payer: Self-pay | Admitting: Physical Medicine & Rehabilitation

## 2014-06-19 VITALS — BP 122/81 | HR 92 | Resp 16

## 2014-06-19 DIAGNOSIS — M1712 Unilateral primary osteoarthritis, left knee: Secondary | ICD-10-CM

## 2014-06-19 DIAGNOSIS — M545 Low back pain: Secondary | ICD-10-CM | POA: Insufficient documentation

## 2014-06-19 DIAGNOSIS — Z79899 Other long term (current) drug therapy: Secondary | ICD-10-CM | POA: Diagnosis present

## 2014-06-19 DIAGNOSIS — Z5181 Encounter for therapeutic drug level monitoring: Secondary | ICD-10-CM | POA: Insufficient documentation

## 2014-06-19 DIAGNOSIS — M25561 Pain in right knee: Secondary | ICD-10-CM

## 2014-06-19 DIAGNOSIS — M1711 Unilateral primary osteoarthritis, right knee: Secondary | ICD-10-CM

## 2014-06-19 DIAGNOSIS — G609 Hereditary and idiopathic neuropathy, unspecified: Secondary | ICD-10-CM

## 2014-06-19 MED ORDER — OXYCODONE-ACETAMINOPHEN 7.5-325 MG PO TABS: 1.0000 | ORAL_TABLET | ORAL | Status: DC | PRN

## 2014-06-19 MED ORDER — MORPHINE SULFATE ER 15 MG PO TBCR: 15.0000 mg | EXTENDED_RELEASE_TABLET | Freq: Two times a day (BID) | ORAL | Status: DC

## 2014-06-19 NOTE — Progress Notes (Signed)
Subjective:    Patient ID: Blake Summers, male    DOB: Feb 23, 1946, 69 y.o.   MRN: [id number]  HPI   [name] is back regarding his chronic pain. Pain levels have been fairly steady. He doesn't like the rainy weather. It keeps him in the house primarily. He is in his wheelchair.   He remains on his pain meds as prescribed. He is compliant. He remains on oxygen continuously.   He complains of numbness in his left hand especially over the first three fingers. Apparently he has a NCS scheduled in the near future.      Pain Inventory Average Pain 8 Pain Right Now 7 My pain is sharp and stabbing  In the last 24 hours, has pain interfered with the following? General activity 9 Relation with others 9 Enjoyment of life 8 What TIME of day is your pain at its worst? daytime Sleep (in general) Poor  Pain is worse with: walking and standing Pain improves with: rest, heat/ice, medication and injections Relief from Meds: 7  Mobility walk with assistance use a cane use a walker ability to climb steps?  no do you drive?  yes use a wheelchair  Function retired I need assistance with the following:  dressing, meal prep, household duties and shopping  Neuro/Psych weakness numbness tingling trouble walking dizziness  Prior Studies Any changes since last visit?  no  Physicians involved in your care Any changes since last visit?  no   Family History  Problem Relation Age of Onset  . Cancer Brother     Colon Cancer  . Heart disease Brother   . Heart disease Brother   . Asthma Mother    History   Social History  . Marital Status: Married    Spouse Name: N/A    Number of Children: N/A  . Years of Education: N/A   Occupational History  . Retired     Nature conservation officeroperations manager   Social History Main Topics  . Smoking status: Former Smoker -- 0.50 packs/day for 15 years    Types: Cigarettes    Quit date: 06/12/1988  . Smokeless tobacco: Never Used  . Alcohol Use: No    . Drug Use: No  . Sexual Activity: None   Other Topics Concern  . None   Social History Narrative   Diet is "good"   Exercise is limited by medical problems   Past Surgical History  Procedure Laterality Date  . Lithotripsy  2007   Past Medical History  Diagnosis Date  . GERD 07/06/2010  . Edema 10/28/2007  . DIABETES MELLITUS, TYPE II 05/04/2007  . HYPERLIPIDEMIA 07/08/2008  . ALLERGIC RHINITIS 07/08/2008  . GLAUCOMA 07/08/2008  . CEREBROVASCULAR ACCIDENT, HX OF 07/08/2008  . OBSTRUCTIVE SLEEP APNEA 03/06/2008  . DIABETIC ULCER, LEFT LEG 08/23/2009  . 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 HYPERTENSION 10/28/2007  . VENOUS INSUFFICIENCY 03/08/2009  . FATTY LIVER DISEASE 05/19/2008  . BENIGN PROSTATIC HYPERTROPHY 07/08/2008  . 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 NEPHROLITHIASIS, HX OF 07/08/2008  . Morbid obesity   . DM nephropathy/sclerosis   . 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 Lumbar spondylosis   . Diarrhea 06/08/2013   BP 122/81 mmHg  Pulse 92  Resp 16  SpO2 93%  Opioid Risk Score:   Fall Risk Score:    Review of Systems  Musculoskeletal: Positive for gait problem.       Spasms  Neurological: Positive for dizziness, weakness and  numbness.       Tingling  All other systems reviewed and are negative.      Objective:   Physical Exam  Constitutional: He is oriented to person, place, and time. He appears well-developed and well-nourished.  Looks bright and in no distress  HENT:  Head: Normocephalic and atraumatic.  Eyes: Conjunctivae are normal. Pupils are equal, round, and reactive to light.  Neck: Normal range of motion. Neck supple.  Cardiovascular: Normal rate and regular rhythm.  Pulmonary/Chest: Effort normal and breath sounds normal. No respiratory distress. He has no wheezes.  Abdominal: Soft. Bowel sounds are normal. He exhibits no distension. There is no tenderness.  Musculoskeletal:  No pedal edema noted. Bilateral  compressive wraps--minimal edema appreciated. ROM right knee improved with minimal lag and no pain with movement. Symmetric normal motor tone is noted throughout. Normal muscle bulk. Muscle testing reveals 5/5 muscle strength of the upper extremity, and 4/5 of the lower extremity.  Neurological: He is alert and oriented to person, place, and time. Tinel's + left wrist. Phalen's equivocal. Skin: Skin is warm and dry.  Psychiatric: He has a normal mood and affect. His behavior is normal. Thought content normal.    Assessment & Plan:   1. End-stage right knee osteoarthritis: Continue to use lidocaine patches.  Refilled: MS contin 15 mg #60--use bid.  Percocet 7.5/325 mg # 90 pills--use every four hours as needed for pain.  -HEP still limited.  2. Morbid obesity with generalized deconditioning---w/c bound essentially  3. Chronic low back pain/spondylosis: He is using robaxin 2-3 times a day to augment his pain regimen.  -consider further imaging if we want to do anything more interventional down the road  4.Left knee brace? To help with rom/standing 5. Neuropathy and CTs---discussed splinting, positioning, agree with NCS 15 minutes of face to face patient care time were spent during this visit. All questions were encouraged and answered. Follow up in one month with NP .

## 2014-06-19 NOTE — Patient Instructions (Signed)
AVOID SLEEPING ON YOUR SIDE AND BENDING ARM. TRY TO SLEEP ON YOUR BACK IF YOU CAN  TRY A WRIST SPLINT DURING THE EVENING

## 2014-06-20 LAB — PMP ALCOHOL METABOLITE (ETG): ETGU: NEGATIVE ng/mL

## 2014-06-22 LAB — OPIATES/OPIOIDS (LC/MS-MS)
Codeine Urine: NEGATIVE ng/mL (ref <50)
Hydrocodone: NEGATIVE ng/mL (ref <50)
Hydromorphone: 93 ng/mL (ref <50)
Morphine Urine: 5930 ng/mL (ref <50)
NORHYDROCODONE, UR: NEGATIVE ng/mL (ref <50)
Noroxycodone, Ur: 840 ng/mL (ref <50)
OXYMORPHONE, URINE: 966 ng/mL (ref <50)
Oxycodone, ur: 783 ng/mL (ref <50)

## 2014-06-22 LAB — OXYCODONE, URINE (LC/MS-MS)
NOROXYCODONE, UR: 840 ng/mL (ref <50)
OXYCODONE, UR: 783 ng/mL (ref <50)
Oxymorphone: 966 ng/mL (ref <50)

## 2014-06-22 LAB — AMPHETAMINES (GC/LC/MS), URINE
Amphetamine GC/MS Conf: NEGATIVE ng/mL (ref <250)
Methamphetamine Quant, Ur: NEGATIVE ng/mL (ref <250)

## 2014-06-23 LAB — PRESCRIPTION MONITORING PROFILE (SOLSTAS)
BENZODIAZEPINE SCREEN, URINE: NEGATIVE ng/mL
Barbiturate Screen, Urine: NEGATIVE ng/mL
Buprenorphine, Urine: NEGATIVE ng/mL
CANNABINOID SCRN UR: NEGATIVE ng/mL
CARISOPRODOL, URINE: NEGATIVE ng/mL
COCAINE METABOLITES: NEGATIVE ng/mL
Creatinine, Urine: 107.93 mg/dL (ref >20.0)
Fentanyl, Ur: NEGATIVE ng/mL
MDMA URINE: NEGATIVE ng/mL
MEPERIDINE UR: NEGATIVE ng/mL
Methadone Screen, Urine: NEGATIVE ng/mL
NITRITES URINE, INITIAL: NEGATIVE ug/mL
Propoxyphene: NEGATIVE ng/mL
Tapentadol, urine: NEGATIVE ng/mL
Tramadol Scrn, Ur: NEGATIVE ng/mL
ZOLPIDEM, URINE: NEGATIVE ng/mL
pH, Initial: 7.1 pH (ref 4.5–8.9)

## 2014-06-29 ENCOUNTER — Other Ambulatory Visit: Payer: Self-pay | Admitting: Endocrinology

## 2014-07-02 ENCOUNTER — Other Ambulatory Visit: Payer: Self-pay | Admitting: *Deleted

## 2014-07-02 MED ORDER — TOPIRAMATE 50 MG PO TABS: 50.0000 mg | ORAL_TABLET | Freq: Two times a day (BID) | ORAL | Status: DC

## 2014-07-02 NOTE — Telephone Encounter (Signed)
Fax refill request sent in electronically.

## 2014-07-07 ENCOUNTER — Telehealth: Payer: Self-pay | Admitting: Pulmonary Disease

## 2014-07-07 MED ORDER — DEXLANSOPRAZOLE 60 MG PO CPDR: 1.0000 | DELAYED_RELEASE_CAPSULE | Freq: Every day | ORAL | Status: DC

## 2014-07-07 NOTE — Telephone Encounter (Signed)
Pt called back, verified he needed dexilant refilled to [facility].  Nothing further needed.

## 2014-07-07 NOTE — Telephone Encounter (Signed)
atc home #X2, na, no option to leave vm.  atc cell #, vm not set up.  Wcb.

## 2014-07-10 ENCOUNTER — Encounter (HOSPITAL_COMMUNITY): Payer: Self-pay | Admitting: Emergency Medicine

## 2014-07-10 ENCOUNTER — Encounter: Payer: Self-pay | Admitting: Endocrinology

## 2014-07-10 ENCOUNTER — Emergency Department (HOSPITAL_COMMUNITY): Payer: Medicare Other

## 2014-07-10 ENCOUNTER — Inpatient Hospital Stay (HOSPITAL_COMMUNITY)
Admission: EM | Admit: 2014-07-10 | Discharge: 2014-07-23 | DRG: 004 | Disposition: A | Discharge status: Skilled Nursing Facility | Payer: Medicare Other | Attending: Emergency Medicine | Admitting: Emergency Medicine

## 2014-07-10 ENCOUNTER — Ambulatory Visit (INDEPENDENT_AMBULATORY_CARE_PROVIDER_SITE_OTHER): Payer: Medicare Other | Admitting: Endocrinology

## 2014-07-10 ENCOUNTER — Inpatient Hospital Stay (HOSPITAL_COMMUNITY): Payer: Medicare Other

## 2014-07-10 VITALS — BP 132/64 | HR 102 | Temp 97.6°F

## 2014-07-10 DIAGNOSIS — J9622 Acute and chronic respiratory failure with hypercapnia: Secondary | ICD-10-CM

## 2014-07-10 DIAGNOSIS — G4733 Obstructive sleep apnea (adult) (pediatric): Secondary | ICD-10-CM | POA: Diagnosis present

## 2014-07-10 DIAGNOSIS — J9690 Respiratory failure, unspecified, unspecified whether with hypoxia or hypercapnia: Secondary | ICD-10-CM

## 2014-07-10 DIAGNOSIS — Z888 Allergy status to other drugs, medicaments and biological substances status: Secondary | ICD-10-CM | POA: Diagnosis not present

## 2014-07-10 DIAGNOSIS — I481 Persistent atrial fibrillation: Secondary | ICD-10-CM | POA: Diagnosis not present

## 2014-07-10 DIAGNOSIS — T17990A Other foreign object in respiratory tract, part unspecified in causing asphyxiation, initial encounter: Secondary | ICD-10-CM | POA: Diagnosis present

## 2014-07-10 DIAGNOSIS — Z930 Tracheostomy status: Secondary | ICD-10-CM | POA: Diagnosis not present

## 2014-07-10 DIAGNOSIS — J9620 Acute and chronic respiratory failure, unspecified whether with hypoxia or hypercapnia: Secondary | ICD-10-CM | POA: Diagnosis present

## 2014-07-10 DIAGNOSIS — E230 Hypopituitarism: Secondary | ICD-10-CM | POA: Diagnosis present

## 2014-07-10 DIAGNOSIS — D649 Anemia, unspecified: Secondary | ICD-10-CM | POA: Diagnosis present

## 2014-07-10 DIAGNOSIS — I4891 Unspecified atrial fibrillation: Secondary | ICD-10-CM | POA: Diagnosis present

## 2014-07-10 DIAGNOSIS — J189 Pneumonia, unspecified organism: Secondary | ICD-10-CM | POA: Diagnosis present

## 2014-07-10 DIAGNOSIS — K219 Gastro-esophageal reflux disease without esophagitis: Secondary | ICD-10-CM | POA: Diagnosis present

## 2014-07-10 DIAGNOSIS — G9340 Encephalopathy, unspecified: Secondary | ICD-10-CM | POA: Diagnosis present

## 2014-07-10 DIAGNOSIS — Z4659 Encounter for fitting and adjustment of other gastrointestinal appliance and device: Secondary | ICD-10-CM

## 2014-07-10 DIAGNOSIS — L98499 Non-pressure chronic ulcer of skin of other sites with unspecified severity: Secondary | ICD-10-CM | POA: Diagnosis present

## 2014-07-10 DIAGNOSIS — J9621 Acute and chronic respiratory failure with hypoxia: Secondary | ICD-10-CM

## 2014-07-10 DIAGNOSIS — Z794 Long term (current) use of insulin: Secondary | ICD-10-CM

## 2014-07-10 DIAGNOSIS — G8929 Other chronic pain: Secondary | ICD-10-CM | POA: Diagnosis present

## 2014-07-10 DIAGNOSIS — E872 Acidosis: Secondary | ICD-10-CM | POA: Diagnosis present

## 2014-07-10 DIAGNOSIS — J40 Bronchitis, not specified as acute or chronic: Secondary | ICD-10-CM | POA: Diagnosis present

## 2014-07-10 DIAGNOSIS — I10 Essential (primary) hypertension: Secondary | ICD-10-CM | POA: Diagnosis present

## 2014-07-10 DIAGNOSIS — Z6841 Body Mass Index (BMI) 40.0 and over, adult: Secondary | ICD-10-CM | POA: Diagnosis not present

## 2014-07-10 DIAGNOSIS — I509 Heart failure, unspecified: Secondary | ICD-10-CM | POA: Diagnosis present

## 2014-07-10 DIAGNOSIS — E860 Dehydration: Secondary | ICD-10-CM | POA: Diagnosis present

## 2014-07-10 DIAGNOSIS — Z87891 Personal history of nicotine dependence: Secondary | ICD-10-CM

## 2014-07-10 DIAGNOSIS — R0602 Shortness of breath: Secondary | ICD-10-CM

## 2014-07-10 DIAGNOSIS — L97929 Non-pressure chronic ulcer of unspecified part of left lower leg with unspecified severity: Secondary | ICD-10-CM

## 2014-07-10 DIAGNOSIS — Z79899 Other long term (current) drug therapy: Secondary | ICD-10-CM

## 2014-07-10 DIAGNOSIS — E11622 Type 2 diabetes mellitus with other skin ulcer: Secondary | ICD-10-CM | POA: Diagnosis present

## 2014-07-10 DIAGNOSIS — E785 Hyperlipidemia, unspecified: Secondary | ICD-10-CM | POA: Diagnosis present

## 2014-07-10 DIAGNOSIS — E870 Hyperosmolality and hypernatremia: Secondary | ICD-10-CM | POA: Diagnosis present

## 2014-07-10 DIAGNOSIS — E876 Hypokalemia: Secondary | ICD-10-CM | POA: Diagnosis not present

## 2014-07-10 DIAGNOSIS — E662 Morbid (severe) obesity with alveolar hypoventilation: Secondary | ICD-10-CM | POA: Diagnosis present

## 2014-07-10 DIAGNOSIS — Z9981 Dependence on supplemental oxygen: Secondary | ICD-10-CM

## 2014-07-10 DIAGNOSIS — E119 Type 2 diabetes mellitus without complications: Secondary | ICD-10-CM

## 2014-07-10 DIAGNOSIS — Z452 Encounter for adjustment and management of vascular access device: Secondary | ICD-10-CM

## 2014-07-10 DIAGNOSIS — L97919 Non-pressure chronic ulcer of unspecified part of right lower leg with unspecified severity: Secondary | ICD-10-CM

## 2014-07-10 LAB — BLOOD GAS, ARTERIAL
Acid-Base Excess: 9.3 mmol/L — ABNORMAL HIGH (ref 0.0–2.0)
Acid-Base Excess: 9.6 mmol/L — ABNORMAL HIGH (ref 0.0–2.0)
BICARBONATE: 37.2 meq/L — AB (ref 20.0–24.0)
Bicarbonate: 38.2 mEq/L — ABNORMAL HIGH (ref 20.0–24.0)
DRAWN BY: 365291
Delivery systems: POSITIVE
Expiratory PAP: 15
FIO2: 1 %
FIO2: 100 %
INSPIRATORY PAP: 20
MODE: POSITIVE
O2 Saturation: 93.2 %
O2 Saturation: 93.4 %
PCO2 ART: 98.6 mmHg — AB (ref 35.0–45.0)
PH ART: 7.202 — AB (ref 7.350–7.450)
PO2 ART: 85.3 mmHg (ref 80.0–100.0)
Patient temperature: 98.6
Patient temperature: 98.6
TCO2: 40.2 mmol/L (ref 0–100)
TCO2: 41.6 mmol/L (ref 0–100)
pCO2 arterial: 112 mmHg (ref 35.0–45.0)
pH, Arterial: 7.16 — CL (ref 7.350–7.450)
pO2, Arterial: 86.5 mmHg (ref 80.0–100.0)

## 2014-07-10 LAB — POCT I-STAT 3, ART BLOOD GAS (G3+)
Acid-Base Excess: 8 mmol/L — ABNORMAL HIGH (ref 0.0–2.0)
Bicarbonate: 40.6 mEq/L — ABNORMAL HIGH (ref 20.0–24.0)
O2 Saturation: 80 %
PCO2 ART: 95.8 mmHg — AB (ref 35.0–45.0)
PO2 ART: 56 mmHg — AB (ref 80.0–100.0)
Patient temperature: 98.7
TCO2: 43 mmol/L (ref 0–100)
pH, Arterial: 7.235 — ABNORMAL LOW (ref 7.350–7.450)

## 2014-07-10 LAB — GLUCOSE, CAPILLARY
GLUCOSE-CAPILLARY: 314 mg/dL — AB (ref 70–99)
Glucose-Capillary: 291 mg/dL — ABNORMAL HIGH (ref 70–99)

## 2014-07-10 LAB — BASIC METABOLIC PANEL
ANION GAP: 6 (ref 5–15)
BUN: 16 mg/dL (ref 6–23)
CALCIUM: 8.8 mg/dL (ref 8.4–10.5)
CO2: 38 mmol/L — ABNORMAL HIGH (ref 19–32)
Chloride: 91 mmol/L — ABNORMAL LOW (ref 96–112)
Creatinine, Ser: 1.02 mg/dL (ref 0.50–1.35)
GFR calc non Af Amer: 73 mL/min — ABNORMAL LOW (ref >90)
GFR, EST AFRICAN AMERICAN: 85 mL/min — AB (ref >90)
GLUCOSE: 300 mg/dL — AB (ref 70–99)
POTASSIUM: 3.4 mmol/L — AB (ref 3.5–5.1)
Sodium: 135 mmol/L (ref 135–145)

## 2014-07-10 LAB — TROPONIN I

## 2014-07-10 LAB — CBC WITH DIFFERENTIAL/PLATELET
BASOS PCT: 0 % (ref 0–1)
Basophils Absolute: 0 10*3/uL (ref 0.0–0.1)
Eosinophils Absolute: 0.2 10*3/uL (ref 0.0–0.7)
Eosinophils Relative: 2 % (ref 0–5)
HEMATOCRIT: 44.7 % (ref 39.0–52.0)
Hemoglobin: 13.8 g/dL (ref 13.0–17.0)
LYMPHS PCT: 5 % — AB (ref 12–46)
Lymphs Abs: 0.6 10*3/uL — ABNORMAL LOW (ref 0.7–4.0)
MCH: 27.9 pg (ref 26.0–34.0)
MCHC: 30.9 g/dL (ref 30.0–36.0)
MCV: 90.3 fL (ref 78.0–100.0)
MONOS PCT: 8 % (ref 3–12)
Monocytes Absolute: 1 10*3/uL (ref 0.1–1.0)
Neutro Abs: 11.2 10*3/uL — ABNORMAL HIGH (ref 1.7–7.7)
Neutrophils Relative %: 85 % — ABNORMAL HIGH (ref 43–77)
Platelets: 272 10*3/uL (ref 150–400)
RBC: 4.95 MIL/uL (ref 4.22–5.81)
RDW: 15.7 % — AB (ref 11.5–15.5)
WBC: 13.1 10*3/uL — ABNORMAL HIGH (ref 4.0–10.5)

## 2014-07-10 LAB — LACTIC ACID, PLASMA: Lactic Acid, Venous: 0.8 mmol/L (ref 0.5–2.0)

## 2014-07-10 LAB — MRSA PCR SCREENING: MRSA by PCR: NEGATIVE

## 2014-07-10 LAB — BRAIN NATRIURETIC PEPTIDE: B NATRIURETIC PEPTIDE 5: 106 pg/mL — AB (ref 0.0–100.0)

## 2014-07-10 MED ORDER — ROCURONIUM BROMIDE 50 MG/5ML IV SOLN
50.0000 mg | Freq: Once | INTRAVENOUS | Status: AC
  Administered 2014-07-10: 50 mg via INTRAVENOUS

## 2014-07-10 MED ORDER — HYDRALAZINE HCL 20 MG/ML IJ SOLN
10.0000 mg | Freq: Four times a day (QID) | INTRAMUSCULAR | Status: DC | PRN
Start: 2014-07-10 — End: 2014-07-10

## 2014-07-10 MED ORDER — DEXTROSE 5 % IV SOLN
500.0000 mg | INTRAVENOUS | Status: DC
  Administered 2014-07-10: 500 mg via INTRAVENOUS
  Filled 2014-07-10: qty 500

## 2014-07-10 MED ORDER — HEPARIN SODIUM (PORCINE) 10000 UNIT/ML IJ SOLN
7500.0000 [IU] | Freq: Three times a day (TID) | INTRAMUSCULAR | Status: DC
  Filled 2014-07-10: qty 1

## 2014-07-10 MED ORDER — ROCURONIUM BROMIDE 50 MG/5ML IV SOLN
INTRAVENOUS | Status: AC
  Filled 2014-07-10: qty 2

## 2014-07-10 MED ORDER — ASPIRIN 325 MG PO TABS
325.0000 mg | ORAL_TABLET | Freq: Every day | ORAL | Status: DC
  Filled 2014-07-10: qty 1

## 2014-07-10 MED ORDER — ALBUTEROL SULFATE (2.5 MG/3ML) 0.083% IN NEBU: 2.5000 mg | INHALATION_SOLUTION | Freq: Four times a day (QID) | RESPIRATORY_TRACT | Status: DC

## 2014-07-10 MED ORDER — PANTOPRAZOLE SODIUM 40 MG IV SOLR
40.0000 mg | INTRAVENOUS | Status: DC
  Administered 2014-07-10 – 2014-07-18 (×9): 40 mg via INTRAVENOUS
  Filled 2014-07-10 (×10): qty 40

## 2014-07-10 MED ORDER — FENTANYL CITRATE 0.05 MG/ML IJ SOLN
200.0000 ug | Freq: Once | INTRAMUSCULAR | Status: AC
  Administered 2014-07-10: 200 ug via INTRAVENOUS

## 2014-07-10 MED ORDER — ASPIRIN 300 MG RE SUPP
300.0000 mg | RECTAL | Status: AC
  Administered 2014-07-10: 300 mg via RECTAL
  Filled 2014-07-10: qty 1

## 2014-07-10 MED ORDER — FENTANYL CITRATE 0.05 MG/ML IJ SOLN
INTRAMUSCULAR | Status: AC
  Filled 2014-07-10: qty 2

## 2014-07-10 MED ORDER — SODIUM CHLORIDE 0.9 % IV SOLN: 250.0000 mL | INTRAVENOUS | Status: DC | PRN

## 2014-07-10 MED ORDER — DEXTROSE 5 % IV SOLN
1.0000 g | INTRAVENOUS | Status: DC
  Administered 2014-07-10: 1 g via INTRAVENOUS
  Filled 2014-07-10: qty 10

## 2014-07-10 MED ORDER — SUCCINYLCHOLINE CHLORIDE 20 MG/ML IJ SOLN
INTRAMUSCULAR | Status: AC
  Filled 2014-07-10: qty 1

## 2014-07-10 MED ORDER — CHLORHEXIDINE GLUCONATE 0.12 % MT SOLN
15.0000 mL | Freq: Two times a day (BID) | OROMUCOSAL | Status: DC
  Administered 2014-07-11 – 2014-07-23 (×25): 15 mL via OROMUCOSAL
  Filled 2014-07-10 (×28): qty 15

## 2014-07-10 MED ORDER — ASPIRIN 300 MG RE SUPP: 300.0000 mg | Freq: Every day | RECTAL | Status: DC

## 2014-07-10 MED ORDER — IPRATROPIUM BROMIDE 0.02 % IN SOLN: 0.5000 mg | Freq: Four times a day (QID) | RESPIRATORY_TRACT | Status: DC

## 2014-07-10 MED ORDER — IPRATROPIUM-ALBUTEROL 0.5-2.5 (3) MG/3ML IN SOLN
3.0000 mL | Freq: Four times a day (QID) | RESPIRATORY_TRACT | Status: DC
  Administered 2014-07-10 – 2014-07-21 (×43): 3 mL via RESPIRATORY_TRACT
  Filled 2014-07-10 (×43): qty 3

## 2014-07-10 MED ORDER — HYDRALAZINE HCL 20 MG/ML IJ SOLN
10.0000 mg | INTRAMUSCULAR | Status: DC | PRN
  Administered 2014-07-19: 20 mg via INTRAVENOUS
  Filled 2014-07-10: qty 1

## 2014-07-10 MED ORDER — MIDAZOLAM HCL 2 MG/2ML IJ SOLN
INTRAMUSCULAR | Status: AC
  Filled 2014-07-10: qty 2

## 2014-07-10 MED ORDER — PROPOFOL 10 MG/ML IV BOLUS
INTRAVENOUS | Status: AC
  Administered 2014-07-10: 100 mg via INTRAVENOUS
  Filled 2014-07-10: qty 20

## 2014-07-10 MED ORDER — IOHEXOL 350 MG/ML SOLN
100.0000 mL | Freq: Once | INTRAVENOUS | Status: AC | PRN
  Administered 2014-07-10: 100 mL via INTRAVENOUS

## 2014-07-10 MED ORDER — FUROSEMIDE 10 MG/ML IJ SOLN
40.0000 mg | Freq: Once | INTRAMUSCULAR | Status: AC
  Administered 2014-07-10: 40 mg via INTRAVENOUS
  Filled 2014-07-10: qty 4

## 2014-07-10 MED ORDER — INSULIN GLARGINE 100 UNIT/ML ~~LOC~~ SOLN
15.0000 [IU] | Freq: Every day | SUBCUTANEOUS | Status: DC
  Administered 2014-07-10: 15 [IU] via SUBCUTANEOUS
  Filled 2014-07-10 (×2): qty 0.15

## 2014-07-10 MED ORDER — METOPROLOL TARTRATE 1 MG/ML IV SOLN: 2.5000 mg | INTRAVENOUS | Status: DC | PRN

## 2014-07-10 MED ORDER — POTASSIUM CHLORIDE 10 MEQ/100ML IV SOLN
10.0000 meq | INTRAVENOUS | Status: AC
  Administered 2014-07-10 – 2014-07-11 (×4): 10 meq via INTRAVENOUS
  Filled 2014-07-10 (×4): qty 100

## 2014-07-10 MED ORDER — ALBUTEROL SULFATE (2.5 MG/3ML) 0.083% IN NEBU: 2.5000 mg | INHALATION_SOLUTION | RESPIRATORY_TRACT | Status: DC | PRN

## 2014-07-10 MED ORDER — INSULIN ASPART 100 UNIT/ML ~~LOC~~ SOLN
0.0000 [IU] | SUBCUTANEOUS | Status: DC
  Administered 2014-07-10: 15 [IU] via SUBCUTANEOUS
  Administered 2014-07-10: 7 [IU] via SUBCUTANEOUS
  Administered 2014-07-11: 4 [IU] via SUBCUTANEOUS
  Administered 2014-07-11 (×4): 7 [IU] via SUBCUTANEOUS
  Administered 2014-07-12 (×2): 11 [IU] via SUBCUTANEOUS
  Administered 2014-07-12: 15 [IU] via SUBCUTANEOUS
  Administered 2014-07-12: 11 [IU] via SUBCUTANEOUS
  Administered 2014-07-12 (×2): 7 [IU] via SUBCUTANEOUS
  Administered 2014-07-13: 4 [IU] via SUBCUTANEOUS
  Administered 2014-07-13: 7 [IU] via SUBCUTANEOUS
  Administered 2014-07-13 (×2): 4 [IU] via SUBCUTANEOUS
  Administered 2014-07-13: 7 [IU] via SUBCUTANEOUS
  Administered 2014-07-13: 4 [IU] via SUBCUTANEOUS
  Administered 2014-07-14: 7 [IU] via SUBCUTANEOUS
  Administered 2014-07-14: 4 [IU] via SUBCUTANEOUS
  Administered 2014-07-14 (×3): 7 [IU] via SUBCUTANEOUS
  Administered 2014-07-14 – 2014-07-15 (×5): 4 [IU] via SUBCUTANEOUS
  Administered 2014-07-15 (×2): 7 [IU] via SUBCUTANEOUS
  Administered 2014-07-16 (×2): 4 [IU] via SUBCUTANEOUS
  Administered 2014-07-16: 7 [IU] via SUBCUTANEOUS
  Administered 2014-07-16 (×2): 4 [IU] via SUBCUTANEOUS
  Administered 2014-07-16: 7 [IU] via SUBCUTANEOUS
  Administered 2014-07-17: 3 [IU] via SUBCUTANEOUS
  Administered 2014-07-17: 4 [IU] via SUBCUTANEOUS

## 2014-07-10 MED ORDER — ETOMIDATE 2 MG/ML IV SOLN
INTRAVENOUS | Status: AC
  Filled 2014-07-10: qty 20

## 2014-07-10 MED ORDER — ASPIRIN 325 MG PO TABS: 325.0000 mg | ORAL_TABLET | Freq: Every day | ORAL | Status: DC

## 2014-07-10 MED ORDER — SODIUM CHLORIDE 0.9 % IV SOLN
25.0000 ug/h | INTRAVENOUS | Status: DC
  Administered 2014-07-11: 50 ug/h via INTRAVENOUS
  Filled 2014-07-10: qty 50

## 2014-07-10 MED ORDER — FENTANYL BOLUS VIA INFUSION
25.0000 ug | INTRAVENOUS | Status: DC | PRN
  Filled 2014-07-10: qty 25

## 2014-07-10 MED ORDER — ENOXAPARIN SODIUM 40 MG/0.4ML ~~LOC~~ SOLN
40.0000 mg | SUBCUTANEOUS | Status: DC
  Administered 2014-07-10: 40 mg via SUBCUTANEOUS
  Filled 2014-07-10: qty 0.4

## 2014-07-10 MED ORDER — ASPIRIN 81 MG PO CHEW
324.0000 mg | CHEWABLE_TABLET | ORAL | Status: AC
Start: 2014-07-10 — End: 2014-07-10

## 2014-07-10 MED ORDER — LIDOCAINE HCL (CARDIAC) 20 MG/ML IV SOLN
INTRAVENOUS | Status: AC
  Filled 2014-07-10: qty 5

## 2014-07-10 MED ORDER — FENTANYL CITRATE 0.05 MG/ML IJ SOLN
100.0000 ug | INTRAMUSCULAR | Status: DC | PRN
  Administered 2014-07-10: 100 ug via INTRAVENOUS
  Filled 2014-07-10: qty 2

## 2014-07-10 MED ORDER — CETYLPYRIDINIUM CHLORIDE 0.05 % MT LIQD: 7.0000 mL | Freq: Two times a day (BID) | OROMUCOSAL | Status: DC

## 2014-07-10 MED ORDER — BUDESONIDE 0.25 MG/2ML IN SUSP
0.2500 mg | Freq: Four times a day (QID) | RESPIRATORY_TRACT | Status: DC
  Administered 2014-07-10 – 2014-07-14 (×15): 0.25 mg via RESPIRATORY_TRACT
  Filled 2014-07-10 (×19): qty 2

## 2014-07-10 MED ORDER — PROPOFOL 10 MG/ML IV BOLUS
100.0000 mg | Freq: Once | INTRAVENOUS | Status: AC
  Administered 2014-07-10: 100 mg via INTRAVENOUS

## 2014-07-10 MED ORDER — PROPOFOL 10 MG/ML IV BOLUS: 10.0000 mg | Freq: Once | INTRAVENOUS | Status: DC

## 2014-07-10 MED ORDER — HEPARIN SODIUM (PORCINE) 5000 UNIT/ML IJ SOLN
5000.0000 [IU] | Freq: Three times a day (TID) | INTRAMUSCULAR | Status: DC
Start: 2014-07-10 — End: 2014-07-10

## 2014-07-10 NOTE — Progress Notes (Signed)
Pt transported to ICU on Bipap w/ no apparent complications.

## 2014-07-10 NOTE — ED Notes (Signed)
Pt deteriorating, oxygen needs increasing, mental status decreasing.  md at bedside, charge rn notified, pt transferred to trauma room, report given to CharlotteJody, Charity fundraiserN.  Pt care transferred.

## 2014-07-10 NOTE — Progress Notes (Signed)
Wasted the following in sink with Juanda BondJames Artis, RN:  100 mg Propofol 200 mcg Fentanyl 2 mg Versed

## 2014-07-10 NOTE — Procedures (Signed)
Intubation Procedure Note Blake SalenJoseph L Summers [id number] 02-01-1946  Procedure: Intubation Indications: Respiratory insufficiency  Procedure Details Consent: Unable to obtain consent because of emergent medical necessity. Time Out: Verified patient identification, verified procedure, site/side was marked, verified correct patient position, special equipment/implants available, medications/allergies/relevent history reviewed, required imaging and test results available.  Performed  Maximum sterile technique was used including gloves, hand hygiene and mask.   Sedated with 100 mcg Propofol, 200 mcg Fentanyl. Given short TM distance and body habitus we then confirmed that we could mask ventilate him. Once we proved that he was ventilatable he was paralyzed with 50 mg of rocuronium. Using a 3.0 glidescope a grade 1 view was obtained and an 8.0 ETT was placed to 25 cm. Placement was confirmed with color change and bilateral breath sounds.     Evaluation Hemodynamic Status: BP stable throughout; O2 sats: stable throughout Patient's Current Condition: stable Complications: No apparent complications Patient did tolerate procedure well. Chest X-ray ordered to verify placement.  CXR: pending.   Blake Summers R. 07/10/2014

## 2014-07-10 NOTE — Progress Notes (Signed)
eLink Physician-Brief Progress Note Patient Name: Blake SalenJoseph L Mcmaster DOB: August 03, 1945 MRN: [id number]   Date of Service  07/10/2014  HPI/Events of Note    eICU Interventions  Increased low peep, suspect that EPAP of 8 inadequate to prevent dynamic obstruction. New settings BiPAp 20/15 Will check ABG in 1 hour     Intervention Category Major Interventions: Respiratory failure - evaluation and management  Caedence Snowden S. 07/10/2014, 8:05 PM

## 2014-07-10 NOTE — Progress Notes (Signed)
Sat 87-88%, fio2 increased to 100%, RN aware.

## 2014-07-10 NOTE — H&P (Signed)
PULMONARY / CRITICAL CARE MEDICINE   Name: Blake Summers MRN: [id number] DOB: 1945-09-29    ADMISSION DATE:  07/10/2014 CONSULTATION DATE:  07/10/14  REFERRING MD :  Dr. Gonzella Lex  CHIEF COMPLAINT:  Shortness of breath  INITIAL PRESENTATION: Blake Summers is a 69yo man w/ PMHx of obstructive sleep apnea, obesity hypoventilation syndrome, GERD, Type 2 DM, and chronic hypercarbic respiratory failure on BiPAP at home who presented with worsening SOB for the past week. Found to be in acute on chronic hypercarbic respiratory failure.   STUDIES:  CXR 1/29>> No edema or consolidations. Evidence of emphysema CT Angio Chest 1/29>> No PE. Cardiomegaly, sm R pleural effusion and interstitial thickening likely CHF.   SIGNIFICANT EVENTS: 1/29>> Presented to [facility] for worsening SOB. Found to be in acute on chronic hypercarbic respiratory failure, admitted to ICU.    HISTORY OF PRESENT ILLNESS:  Blake Summers is a 69yo man w/ PMHx of obstructive sleep apnea, obesity hypoventilation syndrome, GERD, Type 2 DM, and chronic hypercarbic respiratory failure on BiPAP at home who presented to the ED with worsening SOB for the past week. Patient unable to give a history, but wife was present who states he had a "head cold" starting on 1/24. Wife reports progressive worsening of SOB and wheezing. She states yesterday he was desaturating to the 70s. Then today he desaturated again to the 70s and claimed he was not feeling well so the wife brought him to the hospital. In the ED he was placed on BiPAP and was saturating in 90-95%. PCCM was consulted to admit.   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  has past surgical history that includes Lithotripsy (2007). Prior to Admission medications   Medication Sig Start Date End Date Taking? Authorizing Provider  allopurinol (ZYLOPRIM) 300 MG tablet TAKE 1 TABLET BY MOUTH EVERY DAY 06/01/14  Yes [name], MD  brimonidine (ALPHAGAN) 0.15 % ophthalmic solution Place 1 drop into both eyes as directed.   Yes Historical Provider, MD  CLARITIN-D 12 HOUR 5-120 MG per tablet TAKE 1 TABLET BY MOUTH 2 TIMES A DAY 06/29/14  Yes [name], MD  clobetasol ointment (TEMOVATE) 0.05 % Apply topically 3 (three) times daily. 10/29/12  Yes [name], MD  clotrimazole (MYCELEX) 10 MG troche Take 1 tablet (10 mg total) by mouth 5 (five) times daily. 02/25/14  Yes [name], MD  dexlansoprazole (DEXILANT) 60 MG capsule Take 1 capsule (60 mg total) by mouth daily. 07/07/14  Yes Barbaraann Share, MD  diclofenac sodium (VOLTAREN) 1 % GEL APPLY TOPICALLY 4 TIMES DAILY AS PHYSICIAN INSTRUCTED 04/01/14  Yes Ranelle Oyster, MD  diltiazem (CARDIZEM CD) 180 MG 24 hr capsule TAKE 2 CAPSULES BY MOUTH DAILY 06/01/14  Yes [name], MD  dipyridamole-aspirin (AGGRENOX) 200-25 MG per 12 hr capsule TAKE 1 CAPSULE BY MOUTH 2 TIMES DAILY 06/01/14  Yes [name], MD  dorzolamide-timolol (COSOPT) 22.3-6.8 MG/ML ophthalmic solution Place 1 drop into both eyes as directed.  03/13/11  Yes Historical Provider, MD  fluconazole (DIFLUCAN) 150 MG tablet Take 1 tablet (150 mg  total) by mouth as directed. Take 1 tab daily X 3 days, then 1 tab po q week X 3 weeks  DO NOT take statin medication on the days you take this medication Patient taking differently: Take  150 mg by mouth See admin instructions. Take 1 tab daily X 3 days, then 1 tab po q week X 3 weeks  DO NOT take statin medication on the days you take this medication 06/16/13  Yes Mosie Lukes, MD  furosemide (LASIX) 80 MG tablet TAKE 1 TABLET BY MOUTH EVERY DAY 06/29/14  Yes Renato Shin, MD  Insulin NPH, Human,, Isophane, (HUMULIN N KWIKPEN) 100 UNIT/ML Kiwkpen Inject 60 Units into the skin at bedtime. 05/27/14  Yes Renato Shin, MD  ketoconazole (NIZORAL) 2 % shampoo Apply 1 application topically once a week.  03/13/11  Yes Historical Provider, MD  latanoprost (XALATAN) 0.005 % ophthalmic solution Place 1 drop into both eyes at bedtime. 10/29/12  Yes Renato Shin, MD  lidocaine (LIDODERM) 5 % Place 1-3 patches onto the skin daily. Remove & Discard patch within 12 hours or as directed by MD 05/15/13  Yes Ivan Anchors Love, PA-C  losartan-hydrochlorothiazide (HYZAAR) 100-25 MG per tablet TAKE 1 TABLET BY MOUTH EVERY DAY   Yes Renato Shin, MD  lovastatin (MEVACOR) 40 MG tablet TAKE 1 TABLET BY MOUTH AT BEDTIME FOR CHOLESTEROL 09/26/13  Yes Renato Shin, MD  methocarbamol (ROBAXIN) 500 MG tablet Take 500 mg by mouth at bedtime. For cramping   Yes Historical Provider, MD  morphine (MS CONTIN) 15 MG 12 hr tablet Take 1 tablet (15 mg total) by mouth 2 (two) times daily. 06/19/14  Yes Meredith Staggers, MD  naproxen sodium (ANAPROX) 220 MG tablet Take 220 mg by mouth daily.    Yes Historical Provider, MD  NOVOLOG FLEXPEN 100 UNIT/ML FlexPen INJECT 3 TIMES PER DAY JUST BEFORE EACH MEAL (90-120-160 UNITS) Patient taking differently: INJECT 90 UNITS WITH BREAKFAST, 120 UNITS AT LUNCH AND 160 UNITS WITH SUPPER (3 TIMES DAILY WITH MEALS) 04/30/14  Yes Renato Shin, MD  oxyCODONE-acetaminophen (PERCOCET) 7.5-325 MG per tablet Take 1 tablet by mouth every 4 (four) hours as needed for pain. Patient taking differently: Take 1 tablet by mouth 2 (two) times daily as needed for pain.  06/19/14  Yes Meredith Staggers, MD   polyethylene glycol powder (GLYCOLAX/MIRALAX) powder DISSOLVE 1 CAPFUL (17GM) IN 8-10 OUNCES OF WATER & DRINK EVERY DAY 06/29/14  Yes Renato Shin, MD  potassium chloride SA (K-DUR,KLOR-CON) 20 MEQ tablet Take 1 tablet (20 mEq total) by mouth daily. 05/27/14  Yes Renato Shin, MD  PROVENTIL HFA 108 (90 BASE) MCG/ACT inhaler ONE PUFF EVERY 4 HOURS AS NEEDED Patient taking differently: ONE PUFF EVERY 4 HOURS AS NEEDED FOR SHORTNESS OF BREATH 11/06/11  Yes Renato Shin, MD  ranitidine (ZANTAC) 150 MG tablet TAKE 2 TABLETS BY MOUTH EVERY NIGHT AT BEDTIME 06/29/14  Yes Renato Shin, MD  topiramate (TOPAMAX) 50 MG tablet Take 1 tablet (50 mg total) by mouth 2 (two) times daily. 07/02/14  Yes Meredith Staggers, MD  zolpidem (AMBIEN) 10 MG tablet Take 1 tablet (10 mg total) by mouth at bedtime as needed. 03/20/13  Yes Renato Shin, MD  BAYER CONTOUR NEXT TEST test strip CHECK BLOOD SUGAR TWICE DAILY 06/01/14   Renato Shin, MD  Blood Glucose Monitoring Suppl (BAYER CONTOUR NEXT MONITOR) W/DEVICE KIT 1 Device by Does not apply route once. 05/13/13   Renato Shin, MD  cyanocobalamin (,VITAMIN B-12,) 1000 MCG/ML  injection Inject 1,000 mcg into the muscle every 30 (thirty) days.    Historical Provider, MD  diltiazem (CARDIZEM CD) 180 MG 24 hr capsule TAKE 2 CAPSULES BY MOUTH DAILY 04/30/14   Renato Shin, MD  docusate sodium (COLACE) 100 MG capsule TAKE 1 CAPSULE BY MOUTH 2 TIMES DAILY 06/01/14   Renato Shin, MD  lactulose (CHRONULAC) 10 GM/15ML solution Take 45 mLs (30 g total) by mouth 2 (two) times daily. 11/11/13   Renato Shin, MD  methocarbamol (ROBAXIN) 500 MG tablet TAKE 1 TABLET BY MOUTH 3 TIMES DAILY AS NEEDED FOR CRAMPING 04/30/14   Renato Shin, MD  NOVOFINE 30G X 8 MM MISC USE 4 TIMES A DAY 06/02/13   Renato Shin, MD  phenol (CHLORASEPTIC) 1.4 % LIQD Use as directed 1 spray in the mouth or throat as needed. 02/08/13   Ria Bush, MD   Allergies  Allergen Reactions  . Neosporin  [Neomycin-Polymyxin-Gramicidin] Other (See Comments)    unknown    FAMILY HISTORY:  indicated that his mother is deceased. He indicated that his father is deceased.  SOCIAL HISTORY:  reports that he quit smoking about 26 years ago. His smoking use included Cigarettes. He has a 7.5 pack-year smoking history. He has never used smokeless tobacco. He reports that he does not drink alcohol or use illicit drugs.  REVIEW OF SYSTEMS:   General: Denies fever, chills, night sweats, changes in weight, changes in appetite HEENT: Denies headaches, ear pain, changes in vision, rhinorrhea, sore throat CV: Denies CP, palpitations, SOB, orthopnea Pulm: See HPI GI: Denies abdominal pain, nausea, vomiting, diarrhea, constipation, melena, hematochezia GU: Denies dysuria, hematuria, frequency Msk: Denies muscle cramps, joint pains Neuro: Denies weakness, numbness, tingling Skin: Denies rashes, bruising  SUBJECTIVE: Patient opens eyes to voice. On BiPAP saturating well.   VITAL SIGNS: Temp:  [97.6 F (36.4 C)-98.4 F (36.9 C)] 98.4 F (36.9 C) (01/29 1200) Pulse Rate:  [86-102] 86 (01/29 1659) Resp:  [18-24] 18 (01/29 1659) BP: (132-154)/(64-77) 154/77 mmHg (01/29 1200) SpO2:  [68 %-95 %] 95 % (01/29 1659) HEMODYNAMICS:     INTAKE / OUTPUT:  Intake/Output Summary (Last 24 hours) at 07/10/14 1723 Last data filed at 07/10/14 1458  Gross per 24 hour  Intake      0 ml  Output    350 ml  Net   -350 ml    PHYSICAL EXAMINATION: General: obese, plethoric face, on BiPAP Neuro: opens eyes to voice HEENT: [place]/AT, PERRL Cardiovascular: irregularly irregular, no m/g/r Lungs: mild wheezing bilaterally Abdomen: obese, edematous Musculoskeletal: 1-2+ pitting edema in lower extremities Skin: venous skin changes in lower extremities  LABS:  CBC  Recent Labs Lab 07/10/14 1209  WBC 13.1*  HGB 13.8  HCT 44.7  PLT 272   Coag's No results for input(s): APTT, INR in the last 168  hours. BMET  Recent Labs Lab 07/10/14 1209  NA 135  K 3.4*  CL 91*  CO2 38*  BUN 16  CREATININE 1.02  GLUCOSE 300*   Electrolytes  Recent Labs Lab 07/10/14 1209  CALCIUM 8.8   Sepsis Markers No results for input(s): LATICACIDVEN, PROCALCITON, O2SATVEN in the last 168 hours. ABG  Recent Labs Lab 07/10/14 1640  PHART 7.202*  PCO2ART 98.6*  PO2ART 86.5   Liver Enzymes No results for input(s): AST, ALT, ALKPHOS, BILITOT, ALBUMIN in the last 168 hours. Cardiac Enzymes  Recent Labs Lab 07/10/14 1209  TROPONINI <0.03   Glucose No results for input(s): GLUCAP in the last 168  hours.  Imaging No results found.   ASSESSMENT / PLAN:  PULMONARY A: Acute on chronic hypercarbic respiratory failure in setting of OSA, OHS- unknown etiology, viral? OHS- on BiPAP at home OSA Hx smoking P:   Continue on BiPAP, maintain sats in 88-92% range Can transition to nasal cannula if doing well off of BiPAP Check influenza CXR in AM Albuterol nebs prn Pulmicort nebs Q6H Duonebs Q6H   CARDIOVASCULAR A: HTN HLD ?Afib- noted on telemetry P:  Repeat EKG in AM Trend troponins ASA  Hydralazine prn SBP >170 Metoprolol prn HR >115  RENAL A:  Hypokalemia P:   Replete electrolytes prn Repeat bmet in AM  GASTROINTESTINAL A:  GERD Nutrition P:   Protonix 40 mg BID Keep NPO for now, start feeding when resp status improves  HEMATOLOGIC A:  No acute issues P:  CBC intermittently   INFECTIOUS A:  Possible viral infection? P:   BCx2 1/29>>  Azithromycin 1/29>1/29 (1 dose) Rocephin 1/29>1/29 (1 dose)  Check influenza  ENDOCRINE A:  Type 2 DM P:   Lantus 15 units QHS Resistant ISS CBGs 4 times daily   NEUROLOGIC A:  Acute encephalopathy- related to acute respiratory failure P:   Continue to monitor    FAMILY  - Updates: Family updated 1/29    Albin Felling, MD Internal Medicine Resident, PGY-1   PCCM ATTENDING: I have reviewed pt's initial  presentation, consultants notes and hospital database in detail.  The above assessment and plan was formulated under my direction.  In summary: Severe OHS with decompensation. At the time of admission, he was supported on NPPV with marginal cognition and high risk of intubation. The above note accurately reflects our care plan   Merton Border, MD;  PCCM service; Mobile [phone]

## 2014-07-10 NOTE — ED Notes (Signed)
Pt in from MD office, reports having sinus drainage, pt O2 sats on 3L, pt uses 3 L at baseline, Pt placed on 4 L [place] with O2 sats increasing to 90% hx of COPD, SOB worse with exertion, denies CP, n/v/d, speaking in complete sentences upon arrival to ED

## 2014-07-10 NOTE — Progress Notes (Signed)
Subjective:    Patient ID: Blake Summers, male    DOB: 11-19-1945, 69 y.o.   MRN: [id number]  HPI Pt states 6 days of moderate congestion in the nose, and assoc dry cough Past Medical History  Diagnosis Date  . GERD 07/06/2010  . Edema 10/28/2007  . DIABETES MELLITUS, TYPE II 05/04/2007  . HYPERLIPIDEMIA 07/08/2008  . ALLERGIC RHINITIS 07/08/2008  . GLAUCOMA 07/08/2008  . CEREBROVASCULAR ACCIDENT, HX OF 07/08/2008  . OBSTRUCTIVE SLEEP APNEA 03/06/2008  . DIABETIC ULCER, LEFT LEG 08/23/2009  . 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 HYPERTENSION 10/28/2007  . VENOUS INSUFFICIENCY 03/08/2009  . FATTY LIVER DISEASE 05/19/2008  . BENIGN PROSTATIC HYPERTROPHY 07/08/2008  . 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 NEPHROLITHIASIS, HX OF 07/08/2008  . Morbid obesity   . DM nephropathy/sclerosis   . 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 Lumbar spondylosis   . Diarrhea 06/08/2013    Past Surgical History  Procedure Laterality Date  . Lithotripsy  2007    History   Social History  . Marital Status: Married    Spouse Name: N/A    Number of Children: N/A  . Years of Education: N/A   Occupational History  . Retired     Sales promotion account executive   Social History Main Topics  . Smoking status: Former Smoker -- 0.50 packs/day for 15 years    Types: Cigarettes    Quit date: 06/12/1988  . Smokeless tobacco: Never Used  . Alcohol Use: No  . Drug Use: No  . Sexual Activity: Not on file   Other Topics Concern  . Not on file   Social History Narrative   Diet is "good"   Exercise is limited by medical problems    Current Outpatient Prescriptions on File Prior to Visit  Medication Sig Dispense Refill  . allopurinol (ZYLOPRIM) 300 MG tablet TAKE 1 TABLET BY MOUTH EVERY DAY 30 tablet 2  . BAYER CONTOUR NEXT TEST test strip CHECK BLOOD SUGAR TWICE DAILY 100 each 2  . Blood Glucose Monitoring Suppl (BAYER CONTOUR NEXT MONITOR) W/DEVICE KIT 1 Device by Does not apply route  once. 1 kit 0  . brimonidine (ALPHAGAN) 0.15 % ophthalmic solution as directed.    Marland Kitchen CLARITIN-D 12 HOUR 5-120 MG per tablet TAKE 1 TABLET BY MOUTH 2 TIMES A DAY 60 tablet 11  . clobetasol ointment (TEMOVATE) 0.05 % Apply topically 3 (three) times daily. 60 g 4  . clotrimazole (MYCELEX) 10 MG troche Take 1 tablet (10 mg total) by mouth 5 (five) times daily. 35 tablet 2  . cyanocobalamin (,VITAMIN B-12,) 1000 MCG/ML injection Inject 1,000 mcg into the muscle every 30 (thirty) days.    Marland Kitchen dexlansoprazole (DEXILANT) 60 MG capsule Take 1 capsule (60 mg total) by mouth daily. 30 capsule 6  . diclofenac sodium (VOLTAREN) 1 % GEL APPLY TOPICALLY 4 TIMES DAILY AS PHYSICIAN INSTRUCTED 200 g 3  . diltiazem (CARDIZEM CD) 180 MG 24 hr capsule TAKE 2 CAPSULES BY MOUTH DAILY 60 capsule 1  . diltiazem (CARDIZEM CD) 180 MG 24 hr capsule TAKE 2 CAPSULES BY MOUTH DAILY 60 capsule 2  . dipyridamole-aspirin (AGGRENOX) 200-25 MG per 12 hr capsule TAKE 1 CAPSULE BY MOUTH 2 TIMES DAILY 60 capsule 2  . docusate sodium (COLACE) 100 MG capsule TAKE 1 CAPSULE BY MOUTH 2 TIMES DAILY 60 capsule 2  . dorzolamide-timolol (COSOPT) 22.3-6.8 MG/ML ophthalmic solution as directed.    . fluconazole (DIFLUCAN) 150 MG tablet  Take 1 tablet (150 mg total) by mouth as directed. Take 1 tab daily X 3 days, then 1 tab po q week X 3 weeks  DO NOT take statin medication on the days you take this medication 6 tablet 0  . furosemide (LASIX) 80 MG tablet TAKE 1 TABLET BY MOUTH EVERY DAY 30 tablet 2  . insulin aspart (NOVOLOG) 100 UNIT/ML injection 3x a day (just before each meal) 80-130-150 units    . Insulin NPH, Human,, Isophane, (HUMULIN N KWIKPEN) 100 UNIT/ML Kiwkpen Inject 60 Units into the skin at bedtime. 30 mL 11  . ketoconazole (NIZORAL) 2 % shampoo as directed.    . lactulose (CHRONULAC) 10 GM/15ML solution Take 45 mLs (30 g total) by mouth 2 (two) times daily. 960 mL 11  . latanoprost (XALATAN) 0.005 % ophthalmic solution Place 1 drop  into both eyes at bedtime. 2.5 mL 4  . lidocaine (LIDODERM) 5 % Place 1-3 patches onto the skin daily. Remove & Discard patch within 12 hours or as directed by MD 90 patch 2  . losartan-hydrochlorothiazide (HYZAAR) 100-25 MG per tablet TAKE 1 TABLET BY MOUTH EVERY DAY 30 tablet 2  . lovastatin (MEVACOR) 40 MG tablet TAKE 1 TABLET BY MOUTH AT BEDTIME FOR CHOLESTEROL 60 tablet 4  . methocarbamol (ROBAXIN) 500 MG tablet TAKE 1 TABLET BY MOUTH 3 TIMES DAILY AS NEEDED FOR CRAMPING 90 tablet 11  . MICROLET LANCETS MISC     . morphine (MS CONTIN) 15 MG 12 hr tablet Take 1 tablet (15 mg total) by mouth 2 (two) times daily. 60 tablet 0  . naproxen sodium (ANAPROX) 220 MG tablet 2 tablets by mouth once daily as needed for pain     . NOVOFINE 30G X 8 MM MISC USE 4 TIMES A DAY 100 each 3  . NOVOLOG FLEXPEN 100 UNIT/ML FlexPen INJECT 3 TIMES PER DAY JUST BEFORE EACH MEAL (90-120-160 UNITS) 135 mL 1  . oxyCODONE-acetaminophen (PERCOCET) 7.5-325 MG per tablet Take 1 tablet by mouth every 4 (four) hours as needed for pain. 90 tablet 0  . phenol (CHLORASEPTIC) 1.4 % LIQD Use as directed 1 spray in the mouth or throat as needed. 118 mL 0  . polyethylene glycol powder (GLYCOLAX/MIRALAX) powder DISSOLVE 1 CAPFUL (17GM) IN 8-10 OUNCES OF WATER & DRINK EVERY DAY 527 g 2  . potassium chloride (K-DUR) 10 MEQ tablet     . potassium chloride SA (K-DUR,KLOR-CON) 20 MEQ tablet Take 1 tablet (20 mEq total) by mouth daily. 30 tablet 11  . PROVENTIL HFA 108 (90 BASE) MCG/ACT inhaler ONE PUFF EVERY 4 HOURS AS NEEDED 6.7 g 5  . ranitidine (ZANTAC) 150 MG tablet TAKE 2 TABLETS BY MOUTH EVERY NIGHT AT BEDTIME 60 tablet 2  . topiramate (TOPAMAX) 50 MG tablet Take 1 tablet (50 mg total) by mouth 2 (two) times daily. 60 tablet 3  . zolpidem (AMBIEN) 10 MG tablet Take 1 tablet (10 mg total) by mouth at bedtime as needed. 30 tablet 5   No current facility-administered medications on file prior to visit.    Allergies  Allergen  Reactions  . Neosporin [Neomycin-Polymyxin-Gramicidin]     Family History  Problem Relation Age of Onset  . Cancer Brother     Colon Cancer  . Heart disease Brother   . Heart disease Brother   . Asthma Mother     BP 132/64 mmHg  Pulse 102  Temp(Src) 97.6 F (36.4 C) (Oral)  SpO2 68%   Review of  Systems Denies fever.  Chronic sob is worse.     Objective:   Physical Exam VITAL SIGNS:  See vs page GENERAL: no distress.  In wheelchair.  Has 02 on.  head: no deformity eyes: no periorbital swelling, no proptosis external nose and ears are normal mouth: no lesion seen Both eac's and tm's are normal.   LUNGS:  Clear to auscultation  i reviewed last ov note from dr clance: he is rx'ed for chronic hypoventilation and OSA     Assessment & Plan:  resp failure, mod exacerbation, uncertain etiology.  i called ER Dr at [facility].  We agree pt should go there.

## 2014-07-10 NOTE — H&P (Signed)
[facility] History and Physical                                                                                    Patient Demographics  Zinedine Ellner, is a 69 y.o. male  MRN: [id number]   DOB - Oct 11, 1945  Admit Date - 07/10/2014  Outpatient Primary MD for the patient is Renato Shin, MD   With History of -  Past Medical History  Diagnosis Date  . GERD 07/06/2010  . Edema 10/28/2007  . DIABETES MELLITUS, TYPE II 05/04/2007  . HYPERLIPIDEMIA 07/08/2008  . ALLERGIC RHINITIS 07/08/2008  . GLAUCOMA 07/08/2008  . CEREBROVASCULAR ACCIDENT, HX OF 07/08/2008  . OBSTRUCTIVE SLEEP APNEA 03/06/2008  . DIABETIC ULCER, LEFT LEG 08/23/2009  . 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 HYPERTENSION 10/28/2007  . VENOUS INSUFFICIENCY 03/08/2009  . FATTY LIVER DISEASE 05/19/2008  . BENIGN PROSTATIC HYPERTROPHY 07/08/2008  . 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 NEPHROLITHIASIS, HX OF 07/08/2008  . Morbid obesity   . DM nephropathy/sclerosis   . 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 Lumbar spondylosis   . Diarrhea 06/08/2013      Past Surgical History  Procedure Laterality Date  . Lithotripsy  2007    in for   Chief Complaint  Patient presents with  . Shortness of Breath     HPI  Lio Wehrly  is a 69 y.o. male, who is morbidly obese, and utilizes BiPAP at home during the day and night. History is gathered from his wife (who reads lips) as the patient is too lethargic to respond. Per his wife he has been becoming progressively weaker over the past 2 weeks to the point where he is unable to perform ADLs (such as wiping his bottom).  His wife mentions that their granddaughter stayed with them 2 weeks ago and had a cold. The patient complained to his primary care physician that he had a dry cough and congestion for the past 6 days. His wife noted his declining condition to the PCP and he advised her to bring him into the hospital. EMS recorded oxygen saturations of 68% on 3  L. In the ER the patient is satting 88% on 6 L but appears lethargic, dehydrated and unstable.   In the ER CT angio is negative for PE, but notes congestive heart failure with mild interstitial edema. Opacity versus atelectasis is noted in the lower lobes. BNP is 106, troponin is 0.03, WBC is elevated at 13.1.   ABG done on 6L show Ph 7.202, and CO2 of 98.6.  PCCM has agreed to evaluate him.    Review of Systems    In addition to the HPI above, I am unable to obtain a review of  Systems from the patient due to lethargy.  No Headache, No changes with Vision or hearing, No problems swallowing food or Liquids, No Chest pain.  ++ Cough or Shortness of Breath, No Abdominal pain, No Nausea or Vomiting, Bowel movements are regular, No Blood in stool or Urine, No dysuria, No new skin rashes or bruises, No new joints pains-aches,  + + Increased weakness  No recent weight gain  or loss, No polyuria, polydypsia or polyphagia, No significant Mental Stressors.  A full 10 point Review of Systems was done, except as stated above, all other Review of Systems were negative.   Social History History  Substance Use Topics  . Smoking status: Former Smoker -- 0.50 packs/day for 15 years    Types: Cigarettes    Quit date: 06/12/1988  . Smokeless tobacco: Never Used  . Alcohol Use: No     Family History Family History  Problem Relation Age of Onset  . Cancer Brother     Colon Cancer  . Heart disease Brother   . Heart disease Brother   . Asthma Mother    I am unable to gather family history from the patient as he is lethargic.   Prior to Admission medications   Medication Sig Start Date End Date Taking? Authorizing Provider  allopurinol (ZYLOPRIM) 300 MG tablet TAKE 1 TABLET BY MOUTH EVERY DAY 06/01/14   Renato Shin, MD  BAYER CONTOUR NEXT TEST test strip CHECK BLOOD SUGAR TWICE DAILY 06/01/14   Renato Shin, MD  Blood Glucose Monitoring Suppl (BAYER CONTOUR NEXT MONITOR) W/DEVICE KIT 1  Device by Does not apply route once. 05/13/13   Renato Shin, MD  brimonidine (ALPHAGAN) 0.15 % ophthalmic solution as directed. 03/13/11   Historical Provider, MD  CLARITIN-D 12 HOUR 5-120 MG per tablet TAKE 1 TABLET BY MOUTH 2 TIMES A DAY 06/29/14   Renato Shin, MD  clobetasol ointment (TEMOVATE) 0.05 % Apply topically 3 (three) times daily. 10/29/12   Renato Shin, MD  clotrimazole (MYCELEX) 10 MG troche Take 1 tablet (10 mg total) by mouth 5 (five) times daily. 02/25/14   Renato Shin, MD  cyanocobalamin (,VITAMIN B-12,) 1000 MCG/ML injection Inject 1,000 mcg into the muscle every 30 (thirty) days.    Historical Provider, MD  dexlansoprazole (DEXILANT) 60 MG capsule Take 1 capsule (60 mg total) by mouth daily. 07/07/14   Kathee Delton, MD  diclofenac sodium (VOLTAREN) 1 % GEL APPLY TOPICALLY 4 TIMES DAILY AS PHYSICIAN INSTRUCTED 04/01/14   Meredith Staggers, MD  diltiazem (CARDIZEM CD) 180 MG 24 hr capsule TAKE 2 CAPSULES BY MOUTH DAILY 04/30/14   Renato Shin, MD  diltiazem (CARDIZEM CD) 180 MG 24 hr capsule TAKE 2 CAPSULES BY MOUTH DAILY 06/01/14   Renato Shin, MD  dipyridamole-aspirin (AGGRENOX) 200-25 MG per 12 hr capsule TAKE 1 CAPSULE BY MOUTH 2 TIMES DAILY 06/01/14   Renato Shin, MD  docusate sodium (COLACE) 100 MG capsule TAKE 1 CAPSULE BY MOUTH 2 TIMES DAILY 06/01/14   Renato Shin, MD  dorzolamide-timolol (COSOPT) 22.3-6.8 MG/ML ophthalmic solution as directed. 03/13/11   Historical Provider, MD  fluconazole (DIFLUCAN) 150 MG tablet Take 1 tablet (150 mg total) by mouth as directed. Take 1 tab daily X 3 days, then 1 tab po q week X 3 weeks  DO NOT take statin medication on the days you take this medication 06/16/13   Mosie Lukes, MD  furosemide (LASIX) 80 MG tablet TAKE 1 TABLET BY MOUTH EVERY DAY 06/29/14   Renato Shin, MD  insulin aspart (NOVOLOG) 100 UNIT/ML injection 3x a day (just before each meal) 80-130-150 units 10/04/12   Renato Shin, MD  Insulin NPH, Human,, Isophane, (HUMULIN  N KWIKPEN) 100 UNIT/ML Kiwkpen Inject 60 Units into the skin at bedtime. 05/27/14   Renato Shin, MD  ketoconazole (NIZORAL) 2 % shampoo as directed. 03/13/11   Historical Provider, MD  lactulose (CHRONULAC) 10 GM/15ML solution Take  45 mLs (30 g total) by mouth 2 (two) times daily. 11/11/13   [name], MD  latanoprost (XALATAN) 0.005 % ophthalmic solution Place 1 drop into both eyes at bedtime. 10/29/12   [name], MD  lidocaine (LIDODERM) 5 % Place 1-3 patches onto the skin daily. Remove & Discard patch within 12 hours or as directed by MD 05/15/13   Evlyn Kanner Love, PA-C  losartan-hydrochlorothiazide (HYZAAR) 100-25 MG per tablet TAKE 1 TABLET BY MOUTH EVERY DAY    [name], MD  lovastatin (MEVACOR) 40 MG tablet TAKE 1 TABLET BY MOUTH AT BEDTIME FOR CHOLESTEROL 09/26/13   [name], MD  methocarbamol (ROBAXIN) 500 MG tablet TAKE 1 TABLET BY MOUTH 3 TIMES DAILY AS NEEDED FOR CRAMPING 04/30/14   [name], MD  MICROLET LANCETS MISC  05/14/13   Historical Provider, MD  morphine (MS CONTIN) 15 MG 12 hr tablet Take 1 tablet (15 mg total) by mouth 2 (two) times daily. 06/19/14   Ranelle Oyster, MD  naproxen sodium (ANAPROX) 220 MG tablet 2 tablets by mouth once daily as needed for pain     Historical Provider, MD  NOVOFINE 30G X 8 MM MISC USE 4 TIMES A DAY 06/02/13   [name], MD  NOVOLOG FLEXPEN 100 UNIT/ML FlexPen INJECT 3 TIMES PER DAY JUST BEFORE EACH MEAL [phone] UNITS) 04/30/14   [name], MD  oxyCODONE-acetaminophen (PERCOCET) 7.5-325 MG per tablet Take 1 tablet by mouth every 4 (four) hours as needed for pain. 06/19/14   Ranelle Oyster, MD  phenol (CHLORASEPTIC) 1.4 % LIQD Use as directed 1 spray in the mouth or throat as needed. 02/08/13   Eustaquio Boyden, MD  polyethylene glycol powder (GLYCOLAX/MIRALAX) powder DISSOLVE 1 CAPFUL (17GM) IN 8-10 OUNCES OF WATER & DRINK EVERY DAY 06/29/14   [name], MD  potassium chloride (K-DUR) 10 MEQ tablet  05/30/14   Historical  Provider, MD  potassium chloride SA (K-DUR,KLOR-CON) 20 MEQ tablet Take 1 tablet (20 mEq total) by mouth daily. 05/27/14   [name], MD  PROVENTIL HFA 108 (90 BASE) MCG/ACT inhaler ONE PUFF EVERY 4 HOURS AS NEEDED 11/06/11   [name], MD  ranitidine (ZANTAC) 150 MG tablet TAKE 2 TABLETS BY MOUTH EVERY NIGHT AT BEDTIME 06/29/14   [name], MD  topiramate (TOPAMAX) 50 MG tablet Take 1 tablet (50 mg total) by mouth 2 (two) times daily. 07/02/14   Ranelle Oyster, MD  zolpidem (AMBIEN) 10 MG tablet Take 1 tablet (10 mg total) by mouth at bedtime as needed. 03/20/13   [name], MD    Allergies  Allergen Reactions  . Neosporin [Neomycin-Polymyxin-Gramicidin] Other (See Comments)    unknown    Physical Exam  Vitals  Blood pressure 166/64, pulse 76, temperature 98.4 F (36.9 C), temperature source Oral, resp. rate 18, SpO2 91 %.   General: Massive, red faced, male, sitting on the side of the bed, wobbly, tremoring, lethargic. Appears unstable  Psych:  Unable to assess  Neuro:   Unable to assess  ENT:  Eyes are bulging, sclera are hyperemic, oral membranes are dry  Neck:  Supple but thick, unable to assess JVD, No cervical lymphadenopathy appreciated  Respiratory:  Minimal wheeze heard bilaterally, but breath sounds are difficult to hear due to body habitus.  Cardiac:  RRR, No Gallops, Rubs or Murmurs, No Parasternal Heave.  Abdomen:  Massive, Positive Bowel Sounds, Abdomen  Non tender, No organomegaly appreciated  Skin:  Lower extremities are wrapped due to  nonhealing diabetic ulcerations  Extremities:  Able to move all 4 extremities.   Data Review  CBC  Recent Labs Lab 07/10/14 1209  WBC 13.1*  HGB 13.8  HCT 44.7  PLT 272  MCV 90.3  MCH 27.9  MCHC 30.9  RDW 15.7*  LYMPHSABS 0.6*  MONOABS 1.0  EOSABS 0.2  BASOSABS 0.0   ------------------------------------------------------------------------------------------------------------------  Chemistries    Recent Labs Lab 07/10/14 1209  NA 135  K 3.4*  CL 91*  CO2 38*  GLUCOSE 300*  BUN 16  CREATININE 1.02  CALCIUM 8.8    Cardiac Enzymes  Recent Labs Lab 07/10/14 1209  TROPONINI <0.03    ----------------------------------------------------------------------------------------------------------------  Imaging results:   Dg Chest 2 View  07/10/2014   CLINICAL DATA:  Cough and progressive shortness of breath  EXAM: CHEST  2 VIEW  COMPARISON:  March 05, 2013 and March 16, 2006  FINDINGS: There is chronic scarring adjacent to the aortic arch and descending thoracic aorta on the left, stable. There is a degree of underlying emphysematous change. There is no appreciable edema or consolidation. The heart is mildly enlarged with pulmonary vascularity within normal limits. No adenopathy. There is degenerative change in the thoracic spine.  IMPRESSION: No edema or consolidation. Underlying emphysema. Stable scarring adjacent to the proximal descending thoracic aorta.   Electronically Signed   By: Bretta Bang M.D.   On: 07/10/2014 13:56   Ct Angio Chest Pe W/cm &/or Wo Cm  07/10/2014   CLINICAL DATA:  "reports having sinus drainage, pt O2 sats on 3L, pt uses 3 L at baseline, Pt placed on 4 L [place] with O2 sats increasing to 90% hx of COPD, SOB worse with exertion, denies CP, n/v/d, speaking in complete sentences upon arrival to ED"  EXAM: CT ANGIOGRAPHY CHEST WITH CONTRAST  TECHNIQUE: Multidetector CT imaging of the chest was performed using the standard protocol during bolus administration of intravenous contrast. Multiplanar CT image reconstructions and MIPs were obtained to evaluate the vascular anatomy.  CONTRAST:  OMNIPAQUE IOHEXOL 350 MG/ML SOLN  COMPARISON:  Current chest radiograph  FINDINGS: No evidence of a pulmonary embolus.  Heart is mildly enlarged. Great vessels are normal in caliber. There are mild coronary artery calcifications.  Several prominent to mildly enlarged  mediastinal lymph nodes are noted. Largest is a precarinal node measuring 2 cm in short axis.  No hilar masses or discrete enlarged lymph nodes.  Small right pleural effusion. Dependent lower lobe opacity noted, right greater left, most consistent with atelectasis. There is also interstitial thickening along the bronchi most notably of the lower lobes. Mild interstitial thickening is noted more diffusely.  Limited evaluation of the upper abdomen shows no acute findings.  There are degenerative changes throughout the visualized spine. No osteoblastic or osteolytic lesions.  Superficial venous collaterals are noted along the central and left anterior chest wall.  Review of the MIP images confirms the above findings.  IMPRESSION: 1. No evidence of a pulmonary embolus. 2. Cardiomegaly, small right pleural effusion and interstitial thickening supports congestive heart failure with mild interstitial edema. 3. Additional lung opacity is noted in the dependent lower lobes most likely atelectasis. 4. Mild mediastinal adenopathy, most likely reactive.   Electronically Signed   By: Amie Portland M.D.   On: 07/10/2014 15:41    My personal review of EKG: Tachycardic and irregular.    Assessment & Plan  Principal Problem:   Acute on chronic respiratory failure Active Problems:   HYPOPITUITARISM   Obstructive  sleep apnea   Obesity hypoventilation syndrome   DM (diabetes mellitus)   Diabetic ulcer of both lower extremities   CAP (community acquired pneumonia)   Acute on chronic respiratory failure with hypercapnia  Acute on chronic respiratory failure with hypercarbia. Patient still very lethargic on Bipap.  PCCM evaluated and has decided to admit the patient. Uncertain etiology - possible CAP given his recent symptoms and exposure to has grand dtr. CT mentions possible chf.  Possible community acquired pneumonia Blood cultures ordered.  Azith and rocephin ordered.  Lactic acid pending. Urine for strep and  legionella antigen are ordered.  HIV ordered.  CHF Possibility noted on CT chest.  Will give Lasix 40 mg IV. Recommend updating his 2D echocardiogram this admission.  Diabetes Mellitus Is on Humulin 60 units qhs, Novolog 90 units with breakfast, 120 units with lunch, 160 units with dinner. Patient is NPO.  Will place on Sliding scale resistant and monitor closely.  Hypertensive urgency 191/91 Patient NPO.  Will place on Hydralazine PRN SBP > 180.   May well need a cardizem drip.  Chronic pain.  Sees a Pain Management MD.  Will need to address.    DVT Prophylaxis:lovenox  AM Labs Ordered, also please review Full Orders  Family Communication:   Wife at bedside   Code Status:  Full (discussed with wife at bedside)  Likely DC to  Rehab vs home.    Condition:  Guarded.  Time spent in minutes : 60    [place], Bobby Rumpf PA-C on 07/10/2014 at 5:38 PM  Between 7am to 7pm - Pager - [phone]  After 7pm go to [email or web address] - password [email or web address]  And look for the night coverage person covering me after hours  [facility]

## 2014-07-10 NOTE — ED Provider Notes (Signed)
CSN: [id number]     Arrival date & time 07/10/14  1157 History   First MD Initiated Contact with Patient 07/10/14 1158     Chief Complaint  Patient presents with  . Shortness of Breath     (Consider location/radiation/quality/duration/timing/severity/associated sxs/prior Treatment) HPI Comments: Patient presents to the ER for evaluation of shortness of breath. Patient was sent to the emergency room from his doctor's office. Patient has reportedly been sick for the last week with sinus congestion and cough. He went to his doctor today and was found to have oxygen saturation of 68% on 3 L. Patient is normally on 3 L at home. He is brought to the ER by ambulance. His nasal cannula oxygen has been increased to 4 L and he is at 90%, but he has been noted to drop significantly with exertion. He is not expressing any chest pain. He has not had any fever.  Patient is a 69 y.o. male presenting with shortness of breath.  Shortness of Breath Associated symptoms: cough     Past Medical History  Diagnosis Date  . GERD 07/06/2010  . Edema 10/28/2007  . DIABETES MELLITUS, TYPE II 05/04/2007  . HYPERLIPIDEMIA 07/08/2008  . ALLERGIC RHINITIS 07/08/2008  . GLAUCOMA 07/08/2008  . CEREBROVASCULAR ACCIDENT, HX OF 07/08/2008  . OBSTRUCTIVE SLEEP APNEA 03/06/2008  . DIABETIC ULCER, LEFT LEG 08/23/2009  . 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 HYPERTENSION 10/28/2007  . VENOUS INSUFFICIENCY 03/08/2009  . FATTY LIVER DISEASE 05/19/2008  . BENIGN PROSTATIC HYPERTROPHY 07/08/2008  . 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 NEPHROLITHIASIS, HX OF 07/08/2008  . Morbid obesity   . DM nephropathy/sclerosis   . 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 Lumbar spondylosis   . Diarrhea 06/08/2013   Past Surgical History  Procedure Laterality Date  . Lithotripsy  2007   Family History  Problem Relation Age of Onset  . Cancer Brother     Colon Cancer  . Heart disease Brother   . Heart disease Brother    . Asthma Mother    History  Substance Use Topics  . Smoking status: Former Smoker -- 0.50 packs/day for 15 years    Types: Cigarettes    Quit date: 06/12/1988  . Smokeless tobacco: Never Used  . Alcohol Use: No    Review of Systems  HENT: Positive for congestion.   Respiratory: Positive for cough and shortness of breath.   All other systems reviewed and are negative.     Allergies  Neosporin  Home Medications   Prior to Admission medications   Medication Sig Start Date End Date Taking? Authorizing Provider  allopurinol (ZYLOPRIM) 300 MG tablet TAKE 1 TABLET BY MOUTH EVERY DAY 06/01/14   Renato Shin, MD  BAYER CONTOUR NEXT TEST test strip CHECK BLOOD SUGAR TWICE DAILY 06/01/14   Renato Shin, MD  Blood Glucose Monitoring Suppl (BAYER CONTOUR NEXT MONITOR) W/DEVICE KIT 1 Device by Does not apply route once. 05/13/13   Renato Shin, MD  brimonidine (ALPHAGAN) 0.15 % ophthalmic solution as directed. 03/13/11   Historical Provider, MD  CLARITIN-D 12 HOUR 5-120 MG per tablet TAKE 1 TABLET BY MOUTH 2 TIMES A DAY 06/29/14   Renato Shin, MD  clobetasol ointment (TEMOVATE) 0.05 % Apply topically 3 (three) times daily. 10/29/12   Renato Shin, MD  clotrimazole (MYCELEX) 10 MG troche Take 1 tablet (10 mg total) by mouth 5 (five) times daily. 02/25/14   Renato Shin, MD  cyanocobalamin (,VITAMIN B-12,) 1000  MCG/ML injection Inject 1,000 mcg into the muscle every 30 (thirty) days.    Historical Provider, MD  dexlansoprazole (DEXILANT) 60 MG capsule Take 1 capsule (60 mg total) by mouth daily. 07/07/14   Kathee Delton, MD  diclofenac sodium (VOLTAREN) 1 % GEL APPLY TOPICALLY 4 TIMES DAILY AS PHYSICIAN INSTRUCTED 04/01/14   Meredith Staggers, MD  diltiazem (CARDIZEM CD) 180 MG 24 hr capsule TAKE 2 CAPSULES BY MOUTH DAILY 04/30/14   Renato Shin, MD  diltiazem (CARDIZEM CD) 180 MG 24 hr capsule TAKE 2 CAPSULES BY MOUTH DAILY 06/01/14   Renato Shin, MD  dipyridamole-aspirin (AGGRENOX) 200-25 MG  per 12 hr capsule TAKE 1 CAPSULE BY MOUTH 2 TIMES DAILY 06/01/14   Renato Shin, MD  docusate sodium (COLACE) 100 MG capsule TAKE 1 CAPSULE BY MOUTH 2 TIMES DAILY 06/01/14   Renato Shin, MD  dorzolamide-timolol (COSOPT) 22.3-6.8 MG/ML ophthalmic solution as directed. 03/13/11   Historical Provider, MD  fluconazole (DIFLUCAN) 150 MG tablet Take 1 tablet (150 mg total) by mouth as directed. Take 1 tab daily X 3 days, then 1 tab po q week X 3 weeks  DO NOT take statin medication on the days you take this medication 06/16/13   Mosie Lukes, MD  furosemide (LASIX) 80 MG tablet TAKE 1 TABLET BY MOUTH EVERY DAY 06/29/14   Renato Shin, MD  insulin aspart (NOVOLOG) 100 UNIT/ML injection 3x a day (just before each meal) 80-130-150 units 10/04/12   Renato Shin, MD  Insulin NPH, Human,, Isophane, (HUMULIN N KWIKPEN) 100 UNIT/ML Kiwkpen Inject 60 Units into the skin at bedtime. 05/27/14   Renato Shin, MD  ketoconazole (NIZORAL) 2 % shampoo as directed. 03/13/11   Historical Provider, MD  lactulose (CHRONULAC) 10 GM/15ML solution Take 45 mLs (30 g total) by mouth 2 (two) times daily. 11/11/13   Renato Shin, MD  latanoprost (XALATAN) 0.005 % ophthalmic solution Place 1 drop into both eyes at bedtime. 10/29/12   Renato Shin, MD  lidocaine (LIDODERM) 5 % Place 1-3 patches onto the skin daily. Remove & Discard patch within 12 hours or as directed by MD 05/15/13   Ivan Anchors Love, PA-C  losartan-hydrochlorothiazide (HYZAAR) 100-25 MG per tablet TAKE 1 TABLET BY MOUTH EVERY DAY    Renato Shin, MD  lovastatin (MEVACOR) 40 MG tablet TAKE 1 TABLET BY MOUTH AT BEDTIME FOR CHOLESTEROL 09/26/13   Renato Shin, MD  methocarbamol (ROBAXIN) 500 MG tablet TAKE 1 TABLET BY MOUTH 3 TIMES DAILY AS NEEDED FOR CRAMPING 04/30/14   Renato Shin, MD  MICROLET LANCETS MISC  05/14/13   Historical Provider, MD  morphine (MS CONTIN) 15 MG 12 hr tablet Take 1 tablet (15 mg total) by mouth 2 (two) times daily. 06/19/14   Meredith Staggers, MD   naproxen sodium (ANAPROX) 220 MG tablet 2 tablets by mouth once daily as needed for pain     Historical Provider, MD  NOVOFINE 30G X 8 MM MISC USE 4 TIMES A DAY 06/02/13   Renato Shin, MD  NOVOLOG FLEXPEN 100 UNIT/ML FlexPen INJECT 3 TIMES PER DAY JUST BEFORE EACH MEAL [phone] UNITS) 04/30/14   Renato Shin, MD  oxyCODONE-acetaminophen (PERCOCET) 7.5-325 MG per tablet Take 1 tablet by mouth every 4 (four) hours as needed for pain. 06/19/14   Meredith Staggers, MD  phenol (CHLORASEPTIC) 1.4 % LIQD Use as directed 1 spray in the mouth or throat as needed. 02/08/13   Ria Bush, MD  polyethylene glycol powder (GLYCOLAX/MIRALAX) powder DISSOLVE  1 CAPFUL (17GM) IN 8-10 OUNCES OF WATER & DRINK EVERY DAY 06/29/14   Renato Shin, MD  potassium chloride (K-DUR) 10 MEQ tablet  05/30/14   Historical Provider, MD  potassium chloride SA (K-DUR,KLOR-CON) 20 MEQ tablet Take 1 tablet (20 mEq total) by mouth daily. 05/27/14   Renato Shin, MD  PROVENTIL HFA 108 (90 BASE) MCG/ACT inhaler ONE PUFF EVERY 4 HOURS AS NEEDED 11/06/11   Renato Shin, MD  ranitidine (ZANTAC) 150 MG tablet TAKE 2 TABLETS BY MOUTH EVERY NIGHT AT BEDTIME 06/29/14   Renato Shin, MD  topiramate (TOPAMAX) 50 MG tablet Take 1 tablet (50 mg total) by mouth 2 (two) times daily. 07/02/14   Meredith Staggers, MD  zolpidem (AMBIEN) 10 MG tablet Take 1 tablet (10 mg total) by mouth at bedtime as needed. 03/20/13   Renato Shin, MD   BP 154/77 mmHg  Pulse 86  Temp(Src) 98.4 F (36.9 C) (Oral)  Resp 24  SpO2 88% Physical Exam  Constitutional: He is oriented to person, place, and time. He appears well-developed and well-nourished. No distress.  HENT:  Head: Normocephalic and atraumatic.  Right Ear: Hearing normal.  Left Ear: Hearing normal.  Nose: Nose normal.  Mouth/Throat: Oropharynx is clear and moist and mucous membranes are normal.  Eyes: Conjunctivae and EOM are normal. Pupils are equal, round, and reactive to light.  Neck: Normal  range of motion. Neck supple.  Cardiovascular: Regular rhythm, S1 normal and S2 normal.  Exam reveals no gallop and no friction rub.   No murmur heard. Pulmonary/Chest: Effort normal. No respiratory distress. He has decreased breath sounds. He exhibits no tenderness.  Abdominal: Soft. Normal appearance and bowel sounds are normal. There is no hepatosplenomegaly. There is no tenderness. There is no rebound, no guarding, no tenderness at McBurney's point and negative Murphy's sign. No hernia.  Musculoskeletal: Normal range of motion.  Neurological: He is alert and oriented to person, place, and time. He has normal strength. No cranial nerve deficit or sensory deficit. Coordination normal. GCS eye subscore is 4. GCS verbal subscore is 5. GCS motor subscore is 6.  Skin: Skin is warm, dry and intact. No rash noted. No cyanosis.  Psychiatric: He has a normal mood and affect. His speech is normal and behavior is normal. Thought content normal.  Nursing note and vitals reviewed.   ED Course  Procedures (including critical care time) Labs Review Labs Reviewed  CBC WITH DIFFERENTIAL/PLATELET - Abnormal; Notable for the following:    WBC 13.1 (*)    RDW 15.7 (*)    Neutrophils Relative % 85 (*)    Neutro Abs 11.2 (*)    Lymphocytes Relative 5 (*)    Lymphs Abs 0.6 (*)    All other components within normal limits  BASIC METABOLIC PANEL - Abnormal; Notable for the following:    Potassium 3.4 (*)    Chloride 91 (*)    CO2 38 (*)    Glucose, Bld 300 (*)    GFR calc non Af Amer 73 (*)    GFR calc Af Amer 85 (*)    All other components within normal limits  BRAIN NATRIURETIC PEPTIDE - Abnormal; Notable for the following:    B Natriuretic Peptide 106.0 (*)    All other components within normal limits  CULTURE, BLOOD (ROUTINE X 2)  CULTURE, BLOOD (ROUTINE X 2)  TROPONIN I  LACTIC ACID, PLASMA  URINALYSIS, ROUTINE W REFLEX MICROSCOPIC  BLOOD GAS, ARTERIAL  I-STAT ARTERIAL BLOOD GAS, ED  Imaging Review Dg Chest 2 View  07/10/2014   CLINICAL DATA:  Cough and progressive shortness of breath  EXAM: CHEST  2 VIEW  COMPARISON:  March 05, 2013 and March 16, 2006  FINDINGS: There is chronic scarring adjacent to the aortic arch and descending thoracic aorta on the left, stable. There is a degree of underlying emphysematous change. There is no appreciable edema or consolidation. The heart is mildly enlarged with pulmonary vascularity within normal limits. No adenopathy. There is degenerative change in the thoracic spine.  IMPRESSION: No edema or consolidation. Underlying emphysema. Stable scarring adjacent to the proximal descending thoracic aorta.   Electronically Signed   By: Lowella Grip M.D.   On: 07/10/2014 13:56   Ct Angio Chest Pe W/cm &/or Wo Cm  07/10/2014   CLINICAL DATA:  "reports having sinus drainage, pt O2 sats on 3L, pt uses 3 L at baseline, Pt placed on 4 L [place] with O2 sats increasing to 90% hx of COPD, SOB worse with exertion, denies CP, n/v/d, speaking in complete sentences upon arrival to ED"  EXAM: CT ANGIOGRAPHY CHEST WITH CONTRAST  TECHNIQUE: Multidetector CT imaging of the chest was performed using the standard protocol during bolus administration of intravenous contrast. Multiplanar CT image reconstructions and MIPs were obtained to evaluate the vascular anatomy.  CONTRAST:  151mL OMNIPAQUE IOHEXOL 350 MG/ML SOLN  COMPARISON:  Current chest radiograph  FINDINGS: No evidence of a pulmonary embolus.  Heart is mildly enlarged. Great vessels are normal in caliber. There are mild coronary artery calcifications.  Several prominent to mildly enlarged mediastinal lymph nodes are noted. Largest is a precarinal node measuring 2 cm in short axis.  No hilar masses or discrete enlarged lymph nodes.  Small right pleural effusion. Dependent lower lobe opacity noted, right greater left, most consistent with atelectasis. There is also interstitial thickening along the bronchi most  notably of the lower lobes. Mild interstitial thickening is noted more diffusely.  Limited evaluation of the upper abdomen shows no acute findings.  There are degenerative changes throughout the visualized spine. No osteoblastic or osteolytic lesions.  Superficial venous collaterals are noted along the central and left anterior chest wall.  Review of the MIP images confirms the above findings.  IMPRESSION: 1. No evidence of a pulmonary embolus. 2. Cardiomegaly, small right pleural effusion and interstitial thickening supports congestive heart failure with mild interstitial edema. 3. Additional lung opacity is noted in the dependent lower lobes most likely atelectasis. 4. Mild mediastinal adenopathy, most likely reactive.   Electronically Signed   By: Lajean Manes M.D.   On: 07/10/2014 15:41     EKG Interpretation   Date/Time:  Friday July 10 2014 12:23:59 EST Ventricular Rate:  101 PR Interval:  51 QRS Duration: 90 QT Interval:  347 QTC Calculation: 450 R Axis:   -125 Text Interpretation:  Sinus tachycardia with irregular rate Anteroseptal  infarct, age indeterminate Baseline wander in lead(s) V2 No significant  change since last tracing Confirmed by Tyron Manetta  MD, Goldman Birchall [phone]) on  07/10/2014 3:04:05 PM      MDM   Final diagnoses:  Shortness of breath   acute respiratory failure  Patient sent to the ER from his primary care's office for hypoxia. Patient has been sick for approximately a week with nasal congestion and cough. He was very hypoxic initially, 60% on his normal supplement oxygen at the doctor's office. His O2 was increased and he has had significant improvement. He was awake and talking in full  sentences initially here in the ER. His workup has been negative. He does not have any obvious congestive heart failure. No pneumonia. He has, however, become more somnolent here in the ER. A blood gas was obtained and shows significant CO2 retention. Patient will be initiated on  BiPAP, but will be followed closely as he might need intubation prior to admission to the hospital. Signed out to Dr. Darl Householder to follow.    Orpah Greek, MD 07/10/14 [phone]

## 2014-07-10 NOTE — Progress Notes (Signed)
One intact RSI kit returned to [facility]

## 2014-07-10 NOTE — Patient Instructions (Addendum)
We are bringing you to [facility] ER.  i'll see you when you get out.  i hope you feel better soon.

## 2014-07-10 NOTE — ED Notes (Signed)
Patient transported to X-ray 

## 2014-07-11 ENCOUNTER — Inpatient Hospital Stay (HOSPITAL_COMMUNITY): Payer: Medicare Other

## 2014-07-11 DIAGNOSIS — E1159 Type 2 diabetes mellitus with other circulatory complications: Secondary | ICD-10-CM

## 2014-07-11 LAB — BASIC METABOLIC PANEL
Anion gap: 9 (ref 5–15)
BUN: 16 mg/dL (ref 6–23)
CO2: 36 mmol/L — ABNORMAL HIGH (ref 19–32)
CREATININE: 0.96 mg/dL (ref 0.50–1.35)
Calcium: 8.6 mg/dL (ref 8.4–10.5)
Chloride: 94 mmol/L — ABNORMAL LOW (ref 96–112)
GFR calc Af Amer: >90 mL/min (ref >90)
GFR, EST NON AFRICAN AMERICAN: 83 mL/min — AB (ref >90)
GLUCOSE: 208 mg/dL — AB (ref 70–99)
POTASSIUM: 3.8 mmol/L (ref 3.5–5.1)
SODIUM: 139 mmol/L (ref 135–145)

## 2014-07-11 LAB — CBC
HCT: 44.2 % (ref 39.0–52.0)
Hemoglobin: 13.2 g/dL (ref 13.0–17.0)
MCH: 27.3 pg (ref 26.0–34.0)
MCHC: 29.9 g/dL — AB (ref 30.0–36.0)
MCV: 91.5 fL (ref 78.0–100.0)
Platelets: 259 10*3/uL (ref 150–400)
RBC: 4.83 MIL/uL (ref 4.22–5.81)
RDW: 15.7 % — AB (ref 11.5–15.5)
WBC: 15.1 10*3/uL — AB (ref 4.0–10.5)

## 2014-07-11 LAB — BLOOD GAS, ARTERIAL
ACID-BASE EXCESS: 10.1 mmol/L — AB (ref 0.0–2.0)
Bicarbonate: 37.3 mEq/L — ABNORMAL HIGH (ref 20.0–24.0)
DRAWN BY: 31101
FIO2: 100 %
MECHVT: 420 mL
O2 SAT: 96.9 %
PCO2 ART: 87.8 mmHg — AB (ref 35.0–45.0)
PEEP: 8 cmH2O
PH ART: 7.252 — AB (ref 7.350–7.450)
Patient temperature: 98.6
RATE: 18 resp/min
TCO2: 40 mmol/L (ref 0–100)
pO2, Arterial: 103 mmHg — ABNORMAL HIGH (ref 80.0–100.0)

## 2014-07-11 LAB — GLUCOSE, CAPILLARY
GLUCOSE-CAPILLARY: 245 mg/dL — AB (ref 70–99)
Glucose-Capillary: 188 mg/dL — ABNORMAL HIGH (ref 70–99)
Glucose-Capillary: 195 mg/dL — ABNORMAL HIGH (ref 70–99)
Glucose-Capillary: 208 mg/dL — ABNORMAL HIGH (ref 70–99)
Glucose-Capillary: 212 mg/dL — ABNORMAL HIGH (ref 70–99)
Glucose-Capillary: 221 mg/dL — ABNORMAL HIGH (ref 70–99)
Glucose-Capillary: 241 mg/dL — ABNORMAL HIGH (ref 70–99)

## 2014-07-11 LAB — TROPONIN I: Troponin I: 0.04 ng/mL — ABNORMAL HIGH (ref <0.031)

## 2014-07-11 LAB — HEPATIC FUNCTION PANEL
ALBUMIN: 3.2 g/dL — AB (ref 3.5–5.2)
ALT: 15 U/L (ref 0–53)
AST: 18 U/L (ref 0–37)
Alkaline Phosphatase: 94 U/L (ref 39–117)
BILIRUBIN DIRECT: 0.1 mg/dL (ref 0.0–0.5)
Indirect Bilirubin: 0.5 mg/dL (ref 0.3–0.9)
Total Bilirubin: 0.6 mg/dL (ref 0.3–1.2)
Total Protein: 6.6 g/dL (ref 6.0–8.3)

## 2014-07-11 LAB — INFLUENZA PANEL BY PCR (TYPE A & B)
H1N1 flu by pcr: NOT DETECTED
Influenza A By PCR: NEGATIVE
Influenza B By PCR: NEGATIVE

## 2014-07-11 LAB — HEPARIN LEVEL (UNFRACTIONATED): Heparin Unfractionated: 0.16 IU/mL — ABNORMAL LOW (ref 0.30–0.70)

## 2014-07-11 MED ORDER — HEPARIN BOLUS VIA INFUSION
3000.0000 [IU] | Freq: Once | INTRAVENOUS | Status: AC
  Administered 2014-07-11: 3000 [IU] via INTRAVENOUS
  Filled 2014-07-11: qty 3000

## 2014-07-11 MED ORDER — POTASSIUM CHLORIDE 20 MEQ/15ML (10%) PO SOLN
40.0000 meq | Freq: Two times a day (BID) | ORAL | Status: AC
  Administered 2014-07-11 (×2): 40 meq
  Filled 2014-07-11 (×2): qty 30

## 2014-07-11 MED ORDER — INSULIN GLARGINE 100 UNIT/ML ~~LOC~~ SOLN
30.0000 [IU] | Freq: Every day | SUBCUTANEOUS | Status: DC
  Administered 2014-07-11: 30 [IU] via SUBCUTANEOUS
  Filled 2014-07-11 (×2): qty 0.3

## 2014-07-11 MED ORDER — HEPARIN SODIUM (PORCINE) 5000 UNIT/ML IJ SOLN
7500.0000 [IU] | Freq: Three times a day (TID) | INTRAMUSCULAR | Status: DC
  Administered 2014-07-11: 7500 [IU] via SUBCUTANEOUS
  Filled 2014-07-11: qty 2
  Filled 2014-07-11 (×3): qty 1.5

## 2014-07-11 MED ORDER — HEPARIN (PORCINE) IN NACL 100-0.45 UNIT/ML-% IJ SOLN
2400.0000 [IU]/h | INTRAMUSCULAR | Status: DC
  Administered 2014-07-11: 1550 [IU]/h via INTRAVENOUS
  Administered 2014-07-12: 1750 [IU]/h via INTRAVENOUS
  Administered 2014-07-13: 2300 [IU]/h via INTRAVENOUS
  Administered 2014-07-13: 2100 [IU]/h via INTRAVENOUS
  Administered 2014-07-14: 2400 [IU]/h via INTRAVENOUS
  Filled 2014-07-11 (×11): qty 250

## 2014-07-11 MED ORDER — ASPIRIN 81 MG PO CHEW
81.0000 mg | CHEWABLE_TABLET | Freq: Every day | ORAL | Status: DC
  Administered 2014-07-11 – 2014-07-12 (×2): 81 mg
  Filled 2014-07-11 (×2): qty 1

## 2014-07-11 MED ORDER — CETYLPYRIDINIUM CHLORIDE 0.05 % MT LIQD
7.0000 mL | Freq: Four times a day (QID) | OROMUCOSAL | Status: DC
  Administered 2014-07-11 – 2014-07-23 (×47): 7 mL via OROMUCOSAL

## 2014-07-11 MED ORDER — PRO-STAT SUGAR FREE PO LIQD
30.0000 mL | Freq: Two times a day (BID) | ORAL | Status: DC
  Administered 2014-07-11 – 2014-07-12 (×3): 30 mL
  Filled 2014-07-11 (×4): qty 30

## 2014-07-11 MED ORDER — VITAL HIGH PROTEIN PO LIQD
1000.0000 mL | ORAL | Status: DC
Start: 2014-07-11 — End: 2014-07-11
  Administered 2014-07-11: 1000 mL
  Filled 2014-07-11 (×2): qty 1000

## 2014-07-11 MED ORDER — VITAL HIGH PROTEIN PO LIQD
1000.0000 mL | ORAL | Status: DC
  Filled 2014-07-11 (×2): qty 1000

## 2014-07-11 NOTE — Progress Notes (Addendum)
Dr. Bard HerbertSimmonds made vent changes.  (late entry) Per MD, no ABG needed/ordered post vent changes.

## 2014-07-11 NOTE — Progress Notes (Addendum)
ANTICOAGULATION CONSULT NOTE - Initial Consult  Pharmacy Consult for Heparin  Indication: atrial fibrillation  Allergies  Allergen Reactions  . Neosporin [Neomycin-Polymyxin-Gramicidin] Other (See Comments)    unknown    Patient Measurements: Height: _0  (170.2 cm) Weight: (!) 338 lb 6.5 oz (153.5 kg) IBW/kg (Calculated) : 66.1 Heparin Dosing Weight: 104 kg   Vital Signs: Temp: 98.9 F (37.2 C) (01/30 0751) Temp Source: Oral (01/30 0751) BP: 162/100 mmHg (01/30 0830) Pulse Rate: 100 (01/30 0830)  Labs:  Recent Labs  07/10/14 1209 07/10/14 1912 07/11/14 0025 07/11/14 0800  HGB 13.8  --   --  13.2  HCT 44.7  --   --  44.2  PLT 272  --   --  259  CREATININE 1.02  --   --   --   TROPONINI <0.03 <0.03 0.04*  --     Estimated Creatinine Clearance: 97.7 mL/min (by C-G formula based on Cr of 1.02).   Medical History: Past Medical History  Diagnosis Date  . GERD 07/06/2010  . Edema 10/28/2007  . DIABETES MELLITUS, TYPE II 05/04/2007  . HYPERLIPIDEMIA 07/08/2008  . ALLERGIC RHINITIS 07/08/2008  . GLAUCOMA 07/08/2008  . CEREBROVASCULAR ACCIDENT, HX OF 07/08/2008  . OBSTRUCTIVE SLEEP APNEA 03/06/2008  . DIABETIC ULCER, LEFT LEG 08/23/2009  . 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 HYPERTENSION 10/28/2007  . VENOUS INSUFFICIENCY 03/08/2009  . FATTY LIVER DISEASE 05/19/2008  . BENIGN PROSTATIC HYPERTROPHY 07/08/2008  . 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 NEPHROLITHIASIS, HX OF 07/08/2008  . Morbid obesity   . DM nephropathy/sclerosis   . 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 Lumbar spondylosis   . Diarrhea 06/08/2013    Medications:  Prescriptions prior to admission  Medication Sig Dispense Refill Last Dose  . allopurinol (ZYLOPRIM) 300 MG tablet TAKE 1 TABLET BY MOUTH EVERY DAY 30 tablet 2 07/10/2014 at Unknown time  . brimonidine (ALPHAGAN) 0.15 % ophthalmic solution Place 1 drop into both eyes 2 (two) times daily.    07/10/2014 at Unknown time  .  CLARITIN-D 12 HOUR 5-120 MG per tablet TAKE 1 TABLET BY MOUTH 2 TIMES A DAY 60 tablet 11 07/10/2014 at Unknown time  . clobetasol ointment (TEMOVATE) 0.05 % Apply topically 3 (three) times daily. 60 g 4 07/10/2014 at Unknown time  . clotrimazole (MYCELEX) 10 MG troche Take 1 tablet (10 mg total) by mouth 5 (five) times daily. 35 tablet 2 07/10/2014 at Unknown time  . dexlansoprazole (DEXILANT) 60 MG capsule Take 1 capsule (60 mg total) by mouth daily. 30 capsule 6 07/10/2014 at Unknown time  . diclofenac sodium (VOLTAREN) 1 % GEL APPLY TOPICALLY 4 TIMES DAILY AS PHYSICIAN INSTRUCTED 200 g 3 07/10/2014 at Unknown time  . diltiazem (CARDIZEM CD) 180 MG 24 hr capsule TAKE 2 CAPSULES BY MOUTH DAILY 60 capsule 2 07/10/2014 at Unknown time  . dipyridamole-aspirin (AGGRENOX) 200-25 MG per 12 hr capsule TAKE 1 CAPSULE BY MOUTH 2 TIMES DAILY 60 capsule 2 07/10/2014 at Unknown time  . dorzolamide-timolol (COSOPT) 22.3-6.8 MG/ML ophthalmic solution Place 1 drop into both eyes 2 (two) times daily.    07/10/2014 at Unknown time  . furosemide (LASIX) 80 MG tablet TAKE 1 TABLET BY MOUTH EVERY DAY 30 tablet 2 07/10/2014 at Unknown time  . Insulin NPH, Human,, Isophane, (HUMULIN N KWIKPEN) 100 UNIT/ML Kiwkpen Inject 60 Units into the skin at bedtime. 30 mL 11 07/09/2014 at Unknown time  . ketoconazole (NIZORAL) 2 % shampoo Apply  1 application topically once a week.    Past Week at Unknown time  . latanoprost (XALATAN) 0.005 % ophthalmic solution Place 1 drop into both eyes at bedtime. 2.5 mL 4 07/09/2014 at Unknown time  . lidocaine (LIDODERM) 5 % Place 1-3 patches onto the skin daily. Remove & Discard patch within 12 hours or as directed by MD 90 patch 2 07/10/2014 at Unknown time  . losartan-hydrochlorothiazide (HYZAAR) 100-25 MG per tablet TAKE 1 TABLET BY MOUTH EVERY DAY 30 tablet 2 07/10/2014 at Unknown time  . lovastatin (MEVACOR) 40 MG tablet TAKE 1 TABLET BY MOUTH AT BEDTIME FOR CHOLESTEROL 60 tablet 4 07/09/2014 at Unknown  time  . methocarbamol (ROBAXIN) 500 MG tablet Take 500 mg by mouth at bedtime. For cramping   07/09/2014 at Unknown time  . morphine (MS CONTIN) 15 MG 12 hr tablet Take 1 tablet (15 mg total) by mouth 2 (two) times daily. 60 tablet 0 07/10/2014 at Unknown time  . naproxen sodium (ANAPROX) 220 MG tablet Take 220 mg by mouth daily.    07/10/2014 at Unknown time  . NOVOLOG FLEXPEN 100 UNIT/ML FlexPen INJECT 3 TIMES PER DAY JUST BEFORE EACH MEAL (90-120-160 UNITS) (Patient taking differently: INJECT 90 UNITS WITH BREAKFAST, 120 UNITS AT LUNCH AND 160 UNITS WITH SUPPER (3 TIMES DAILY WITH MEALS)) 135 mL 1 07/10/2014 at Unknown time  . oxyCODONE-acetaminophen (PERCOCET) 7.5-325 MG per tablet Take 1 tablet by mouth every 4 (four) hours as needed for pain. (Patient taking differently: Take 1 tablet by mouth 2 (two) times daily. ) 90 tablet 0 07/10/2014 at Unknown time  . polyethylene glycol powder (GLYCOLAX/MIRALAX) powder DISSOLVE 1 CAPFUL (17GM) IN 8-10 OUNCES OF WATER & DRINK EVERY DAY 527 g 2 07/10/2014 at Unknown time  . potassium chloride SA (K-DUR,KLOR-CON) 20 MEQ tablet Take 1 tablet (20 mEq total) by mouth daily. 30 tablet 11 07/10/2014 at Unknown time  . PROVENTIL HFA 108 (90 BASE) MCG/ACT inhaler ONE PUFF EVERY 4 HOURS AS NEEDED (Patient taking differently: ONE PUFF EVERY 4 HOURS AS NEEDED FOR SHORTNESS OF BREATH) 6.7 g 5 07/10/2014 at Unknown time  . ranitidine (ZANTAC) 150 MG tablet TAKE 2 TABLETS BY MOUTH EVERY NIGHT AT BEDTIME 60 tablet 2 07/09/2014 at Unknown time  . topiramate (TOPAMAX) 50 MG tablet Take 1 tablet (50 mg total) by mouth 2 (two) times daily. 60 tablet 3 07/10/2014 at Unknown time  . zolpidem (AMBIEN) 10 MG tablet Take 1 tablet (10 mg total) by mouth at bedtime as needed. 30 tablet 5 Past Month at Unknown time  . BAYER CONTOUR NEXT TEST test strip CHECK BLOOD SUGAR TWICE DAILY 100 each 2 Taking  . Blood Glucose Monitoring Suppl (BAYER CONTOUR NEXT MONITOR) W/DEVICE KIT 1 Device by Does not  apply route once. 1 kit 0 Taking  . cyanocobalamin (,VITAMIN B-12,) 1000 MCG/ML injection Inject 1,000 mcg into the muscle every 30 (thirty) days.   Taking  . diltiazem (CARDIZEM CD) 180 MG 24 hr capsule TAKE 2 CAPSULES BY MOUTH DAILY 60 capsule 1 Taking  . docusate sodium (COLACE) 100 MG capsule TAKE 1 CAPSULE BY MOUTH 2 TIMES DAILY 60 capsule 2   . lactulose (CHRONULAC) 10 GM/15ML solution Take 45 mLs (30 g total) by mouth 2 (two) times daily. 960 mL 11 Taking  . methocarbamol (ROBAXIN) 500 MG tablet TAKE 1 TABLET BY MOUTH 3 TIMES DAILY AS NEEDED FOR CRAMPING 90 tablet 11 Taking  . NOVOFINE 30G X 8 MM MISC USE 4 TIMES  A DAY 100 each 3 Taking  . phenol (CHLORASEPTIC) 1.4 % LIQD Use as directed 1 spray in the mouth or throat as needed. 118 mL 0 Taking    Assessment: 68 YOM to start on IV heparin for new Afib of unclear duration. Currently on heparin infusion at 1550 units/hr. HL is sub-therapeutic at 0.16. RN reports no s/s of bleeding.   Goal of Therapy:  Heparin level 0.3-0.7 units/ml Monitor platelets by anticoagulation protocol: Yes   Plan:  -Heparin 3000 units bolus, increase heparin infusion to 1750 units/hr.   -F/u 6 hr HL at 0300 -Monitor daily HL, CBC and s/s of bleeding   Albertina Parr, PharmD., BCPS Clinical Pharmacist Pager [phone]    Addendum: First heparin level is sub-therapeutic at 0.16.

## 2014-07-11 NOTE — Progress Notes (Signed)
INITIAL NUTRITION ASSESSMENT  DOCUMENTATION CODES Per approved criteria  -Morbid Obesity   INTERVENTION: Initiate TF via OG with Vital High Protein at 25 ml/h and Prostat 30 ml BID on day 1; on day 2, increase to goal rate of 45 ml/h (1080 ml per day) with Prostat BID, 3x  to provide 1680 kcals, 184.5 gm protein, 907.5 ml free water daily.  NUTRITION DIAGNOSIS: Inadequate oral intake related to inability to eat as evidenced by NPO status  Goal: Enteral nutrition to provide 60-70% of estimated calorie needs (22-25 kcals/kg ideal body weight) and 100% of estimated protein needs, based on ASPEN guidelines for hypocaloric, high protein feeding in critically ill obese individuals  Monitor:  TF tolerance/adequacy, weight trend, labs, vent status.   Reason for Assessment: Consult for TF initiation and administration  69 y.o. male  Admitting Dx: Acute on chronic respiratory failure  ASSESSMENT: 69yo man w/ PMHx of obstructive sleep apnea, obesity hypoventilation syndrome, GERD, Type 2 DM, and chronic hypercarbic respiratory failure on BiPAP at home who presented with worsening SOB for the past week  Patient is currently intubated on ventilator support MV: 8.1 L/min Temp (24hrs), Avg:98.5 F (36.9 C), Min:96.8 F (36 C), Max:99.6 F (37.6 C)  Nutrition Focused Physical Exam: None in all areas, patiently morbidly obese    Height: Ht Readings from Last 1 Encounters:  07/10/14 5\' 7"  (1.702 m)    Weight: Wt Readings from Last 1 Encounters:  07/11/14 338 lb 6.5 oz (153.5 kg)    Ideal Body Weight: 148  % Ideal Body Weight: 228%  Wt Readings from Last 10 Encounters:  07/11/14 338 lb 6.5 oz (153.5 kg)  05/27/14 344 lb (156.037 kg)  02/25/14 341 lb (154.677 kg)  01/20/14 350 lb (158.759 kg)  01/12/14 335 lb 9.6 oz (152.227 kg)  11/19/13 337 lb (152.862 kg)  10/21/13 337 lb (152.862 kg)  07/03/13 337 lb (152.862 kg)  06/25/13 337 lb (152.862 kg)  06/03/13 324 lb 0.6 oz  (146.984 kg)    Usual Body Weight: unable to obtain  BMI:  Body mass index is 52.99 kg/(m^2).  Estimated Nutritional Needs: Kcal:2490 (1500-1750= 60-70%) Protein: >/=168 Fluid: per md  Skin: Dry red, Damaged groin/scrotum  Diet Order:  NPO  EDUCATION NEEDS: -Education not appropriate at this time   Intake/Output Summary (Last 24 hours) at 07/11/14 1329 Last data filed at 07/11/14 1200  Gross per 24 hour  Intake  564.5 ml  Output   1550 ml  Net -985.5 ml    Last BM: n/a   Labs:   Recent Labs Lab 07/10/14 1209  NA 135  K 3.4*  CL 91*  CO2 38*  BUN 16  CREATININE 1.02  CALCIUM 8.8  GLUCOSE 300*    CBG (last 3)   Recent Labs  07/10/14 2336 07/11/14 0422 07/11/14 0743  GLUCAP 241* 212* 188*    Scheduled Meds: . antiseptic oral rinse  7 mL Mouth Rinse QID  . aspirin  81 mg Per Tube Daily  . budesonide  0.25 mg Nebulization 4 times per day  . chlorhexidine  15 mL Mouth Rinse BID  . feeding supplement (PRO-STAT SUGAR FREE 64)  30 mL Per Tube BID  . feeding supplement (VITAL HIGH PROTEIN)  1,000 mL Per Tube Q24H  . insulin aspart  0-20 Units Subcutaneous 6 times per day  . insulin glargine  30 Units Subcutaneous QHS  . ipratropium-albuterol  3 mL Nebulization Q6H  . pantoprazole (PROTONIX) IV  40 mg Intravenous  Q24H  . potassium chloride  40 mEq Per Tube BID    Continuous Infusions: . fentaNYL infusion INTRAVENOUS 50 mcg/hr (07/11/14 1203)  . heparin 1,550 Units/hr (07/11/14 1240)    Past Medical History  Diagnosis Date  . GERD 07/06/2010  . Edema 10/28/2007  . DIABETES MELLITUS, TYPE II 05/04/2007  . HYPERLIPIDEMIA 07/08/2008  . ALLERGIC RHINITIS 07/08/2008  . GLAUCOMA 07/08/2008  . CEREBROVASCULAR ACCIDENT, HX OF 07/08/2008  . OBSTRUCTIVE SLEEP APNEA 03/06/2008  . DIABETIC ULCER, LEFT LEG 08/23/2009  . 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 HYPERTENSION 10/28/2007  . VENOUS INSUFFICIENCY 03/08/2009  . FATTY LIVER DISEASE 05/19/2008  . BENIGN PROSTATIC  HYPERTROPHY 07/08/2008  . 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 NEPHROLITHIASIS, HX OF 07/08/2008  . Morbid obesity   . DM nephropathy/sclerosis   . 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 Lumbar spondylosis   . Diarrhea 06/08/2013    Past Surgical History  Procedure Laterality Date  . Lithotripsy  [address], [place] [facility] [id number] 07/11/2014 1:40 PM

## 2014-07-11 NOTE — Progress Notes (Signed)
PULMONARY / CRITICAL CARE MEDICINE   Name: Blake Summers MRN: [id number] DOB: 05/29/1946    ADMISSION DATE:  07/10/2014 CONSULTATION DATE:  07/10/14  REFERRING MD :  Dr. Gonzella Lexhungel  CHIEF COMPLAINT:  Shortness of breath  INITIAL PRESENTATION: Blake Summers is a 69yo man w/ PMHx of obstructive sleep apnea, obesity hypoventilation syndrome, GERD, Type 2 DM, and chronic hypercarbic respiratory failure on BiPAP at home who presented with worsening SOB for the past week. Found to be in acute on chronic hypercarbic respiratory failure.   MAJOR EVENTS/TEST RESULTS: 1/29 CT Angio Chest: No PE. Cardiomegaly, sm R pleural effusion and interstitial thickening likely CHF.  1/29 failed NPPV. Intubated 1/30 Persistent AF. UFH gtt initiated. Screened for MIND study  INDWELLING DEVICES:: ETT 1/29 >>   MICRO DATA: Resp 1/30  >>  Blood 1/29 >>    ANTIMICROBIALS:     SUBJECTIVE:  RASS -1. + F/C   VITAL SIGNS: Temp:  [96.8 F (36 C)-99.6 F (37.6 C)] 98.7 F (37.1 C) (01/30 1200) Pulse Rate:  [67-104] 94 (01/30 1210) Resp:  [16-26] 18 (01/30 1210) BP: (117-184)/(52-116) 148/69 mmHg (01/30 1210) SpO2:  [88 %-99 %] 96 % (01/30 1210) FiO2 (%):  [60 %-100 %] 60 % (01/30 1210) Weight:  [153.5 kg (338 lb 6.5 oz)] 153.5 kg (338 lb 6.5 oz) (01/30 0500) HEMODYNAMICS:   Vent Mode:  [-] PRVC FiO2 (%):  [60 %-100 %] 60 % Set Rate:  [18 bmp] 18 bmp Vt Set:  [420 mL-500 mL] 500 mL PEEP:  [5 cmH20-8 cmH20] 5 cmH20 Plateau Pressure:  [22 cmH20-28 cmH20] 28 cmH20 INTAKE / OUTPUT:  Intake/Output Summary (Last 24 hours) at 07/11/14 1317 Last data filed at 07/11/14 1200  Gross per 24 hour  Intake  564.5 ml  Output   1550 ml  Net -985.5 ml    PHYSICAL EXAMINATION: General: obese, plethoric, NAD Neuro: no focal deficits HEENT: [place]/AT Cardiovascular: IRIR, rate controlled, no M Lungs: mild wheezing bilaterally Abdomen: obese, edematous Ext: 1-2+ pitting edema in lower extremities Skin: chronic  venous skin changes in lower extremities  LABS: I have reviewed all of today's lab results. Relevant abnormalities are discussed in the A/P section  CXR: L hemithorax completely opacified  ASSESSMENT / PLAN:  PULMONARY A: Acute on chronic hypercarbic respiratory failure OSA/OHS L chest white out - likely atx and/or effusion Remote smoking P:   Cont full vent support - settings reviewed and/or adjusted Cont vent bundle Daily SBT if/when meets criteria Follow CXR Assess for pleural effusion with US or CT chest   CARDIOVASCULAR A:  H/O HTN, HLD AF, CVR - unclear duration Cardiac markers negative P:  Begin heparin Cont hydralazine prn SBP >170 Cont metoprolol prn HR >115 Echocardiogram ordered 1/30  RENAL A:   Hypokalemia P:   Monitor BMET intermittently Monitor I/Os Correct electrolytes as indicated   GASTROINTESTINAL A:   GERD, chronic PPI use P:   SUP: IV PPI Begin TFs 1/30  HEMATOLOGIC A:   No acute issues P:  DVT px: full dose heparin for AF Monitor CBC intermittently Transfuse per usual ICU guidelines   INFECTIOUS A:  No definite infections identified P:   Micro and abx as above  ENDOCRINE A:   Type 2 DM P:   Cont Lantus Cont SSI  NEUROLOGIC A:   Acute encephalopathy ICU associated discomfort P:   RASS goal: -1 PAD protocol   FAMILY  - Updates: Family updated 1/29    CCM X 40 mins  Billy Fischeravid Koula Venier, MD;  PCCM service; Mobile [phone]

## 2014-07-12 ENCOUNTER — Inpatient Hospital Stay (HOSPITAL_COMMUNITY): Payer: Medicare Other

## 2014-07-12 DIAGNOSIS — J9621 Acute and chronic respiratory failure with hypoxia: Secondary | ICD-10-CM

## 2014-07-12 DIAGNOSIS — I4891 Unspecified atrial fibrillation: Secondary | ICD-10-CM

## 2014-07-12 DIAGNOSIS — J189 Pneumonia, unspecified organism: Secondary | ICD-10-CM

## 2014-07-12 LAB — PROCALCITONIN: Procalcitonin: <0.1 ng/mL

## 2014-07-12 LAB — CORTISOL-AM, BLOOD: CORTISOL - AM: 17 ug/dL (ref 4.3–22.4)

## 2014-07-12 LAB — COMPREHENSIVE METABOLIC PANEL
ALT: 14 U/L (ref 0–53)
AST: 16 U/L (ref 0–37)
Albumin: 2.9 g/dL — ABNORMAL LOW (ref 3.5–5.2)
Alkaline Phosphatase: 83 U/L (ref 39–117)
Anion gap: 9 (ref 5–15)
BILIRUBIN TOTAL: 0.5 mg/dL (ref 0.3–1.2)
BUN: 17 mg/dL (ref 6–23)
CHLORIDE: 95 mmol/L — AB (ref 96–112)
CO2: 34 mmol/L — ABNORMAL HIGH (ref 19–32)
CREATININE: 0.78 mg/dL (ref 0.50–1.35)
Calcium: 8.5 mg/dL (ref 8.4–10.5)
GFR calc Af Amer: >90 mL/min (ref >90)
GFR calc non Af Amer: >90 mL/min (ref >90)
GLUCOSE: 285 mg/dL — AB (ref 70–99)
POTASSIUM: 3.8 mmol/L (ref 3.5–5.1)
SODIUM: 138 mmol/L (ref 135–145)
Total Protein: 6.1 g/dL (ref 6.0–8.3)

## 2014-07-12 LAB — CBC
HEMATOCRIT: 42.7 % (ref 39.0–52.0)
Hemoglobin: 12.8 g/dL — ABNORMAL LOW (ref 13.0–17.0)
MCH: 27.6 pg (ref 26.0–34.0)
MCHC: 30 g/dL (ref 30.0–36.0)
MCV: 92.2 fL (ref 78.0–100.0)
Platelets: 244 10*3/uL (ref 150–400)
RBC: 4.63 MIL/uL (ref 4.22–5.81)
RDW: 15.7 % — ABNORMAL HIGH (ref 11.5–15.5)
WBC: 11.1 10*3/uL — ABNORMAL HIGH (ref 4.0–10.5)

## 2014-07-12 LAB — POCT I-STAT 3, ART BLOOD GAS (G3+)
Acid-Base Excess: 8 mmol/L — ABNORMAL HIGH (ref 0.0–2.0)
Bicarbonate: 36.1 mEq/L — ABNORMAL HIGH (ref 20.0–24.0)
O2 Saturation: 95 %
PH ART: 7.365 (ref 7.350–7.450)
PO2 ART: 84 mmHg (ref 80.0–100.0)
Patient temperature: 99.1
TCO2: 38 mmol/L (ref 0–100)
pCO2 arterial: 63.4 mmHg (ref 35.0–45.0)

## 2014-07-12 LAB — T4, FREE: Free T4: 1.06 ng/dL (ref 0.80–1.80)

## 2014-07-12 LAB — HEPARIN LEVEL (UNFRACTIONATED)
Heparin Unfractionated: 0.31 IU/mL (ref 0.30–0.70)
Heparin Unfractionated: 0.3 IU/mL (ref 0.30–0.70)

## 2014-07-12 LAB — PHOSPHORUS
PHOSPHORUS: 1.8 mg/dL — AB (ref 2.3–4.6)
PHOSPHORUS: 3.8 mg/dL (ref 2.3–4.6)

## 2014-07-12 LAB — GLUCOSE, CAPILLARY
GLUCOSE-CAPILLARY: 239 mg/dL — AB (ref 70–99)
GLUCOSE-CAPILLARY: 244 mg/dL — AB (ref 70–99)
Glucose-Capillary: 254 mg/dL — ABNORMAL HIGH (ref 70–99)
Glucose-Capillary: 257 mg/dL — ABNORMAL HIGH (ref 70–99)
Glucose-Capillary: 264 mg/dL — ABNORMAL HIGH (ref 70–99)
Glucose-Capillary: 302 mg/dL — ABNORMAL HIGH (ref 70–99)

## 2014-07-12 LAB — MAGNESIUM
Magnesium: 2.2 mg/dL (ref 1.5–2.5)
Magnesium: 2.3 mg/dL (ref 1.5–2.5)

## 2014-07-12 LAB — TSH: TSH: 0.337 u[IU]/mL — AB (ref 0.350–4.500)

## 2014-07-12 MED ORDER — FENTANYL CITRATE 0.05 MG/ML IJ SOLN
25.0000 ug | INTRAMUSCULAR | Status: DC | PRN
  Administered 2014-07-15: 100 ug via INTRAVENOUS
  Administered 2014-07-15: 50 ug via INTRAVENOUS
  Administered 2014-07-15 – 2014-07-22 (×22): 100 ug via INTRAVENOUS
  Administered 2014-07-22: 50 ug via INTRAVENOUS
  Administered 2014-07-23 (×2): 100 ug via INTRAVENOUS
  Filled 2014-07-12 (×27): qty 2

## 2014-07-12 MED ORDER — MIDAZOLAM HCL 2 MG/2ML IJ SOLN
INTRAMUSCULAR | Status: AC
  Filled 2014-07-12: qty 2

## 2014-07-12 MED ORDER — LATANOPROST 0.005 % OP SOLN
1.0000 [drp] | Freq: Every day | OPHTHALMIC | Status: DC
  Administered 2014-07-12 – 2014-07-22 (×11): 1 [drp] via OPHTHALMIC
  Filled 2014-07-12 (×3): qty 2.5

## 2014-07-12 MED ORDER — SODIUM PHOSPHATE 3 MMOLE/ML IV SOLN
20.0000 mmol | Freq: Once | INTRAVENOUS | Status: AC
  Administered 2014-07-12: 20 mmol via INTRAVENOUS
  Filled 2014-07-12: qty 6.67

## 2014-07-12 MED ORDER — INSULIN GLARGINE 100 UNIT/ML ~~LOC~~ SOLN
45.0000 [IU] | Freq: Every day | SUBCUTANEOUS | Status: DC
  Filled 2014-07-12: qty 0.45

## 2014-07-12 MED ORDER — ASPIRIN 81 MG PO CHEW
81.0000 mg | CHEWABLE_TABLET | Freq: Every day | ORAL | Status: DC
  Administered 2014-07-13 – 2014-07-23 (×7): 81 mg via ORAL
  Filled 2014-07-12 (×10): qty 1

## 2014-07-12 MED ORDER — VITAL HIGH PROTEIN PO LIQD
1000.0000 mL | ORAL | Status: DC
  Administered 2014-07-12: 21:00:00
  Administered 2014-07-12: 1000 mL
  Administered 2014-07-12 – 2014-07-13 (×4)
  Filled 2014-07-12 (×5): qty 1000

## 2014-07-12 MED ORDER — POTASSIUM CHLORIDE 20 MEQ/15ML (10%) PO SOLN
40.0000 meq | Freq: Two times a day (BID) | ORAL | Status: AC
  Administered 2014-07-12 (×2): 40 meq
  Filled 2014-07-12 (×4): qty 30

## 2014-07-12 MED ORDER — SODIUM CHLORIDE 0.9 % IV SOLN
25.0000 ug/h | INTRAVENOUS | Status: DC
  Administered 2014-07-13 – 2014-07-14 (×2): 150 ug/h via INTRAVENOUS
  Filled 2014-07-12 (×3): qty 50

## 2014-07-12 MED ORDER — ROCURONIUM BROMIDE 50 MG/5ML IV SOLN
50.0000 mg | Freq: Once | INTRAVENOUS | Status: AC
  Administered 2014-07-12: 50 mg via INTRAVENOUS

## 2014-07-12 MED ORDER — BRIMONIDINE TARTRATE 0.2 % OP SOLN
1.0000 [drp] | Freq: Two times a day (BID) | OPHTHALMIC | Status: DC
  Administered 2014-07-12 – 2014-07-23 (×22): 1 [drp] via OPHTHALMIC
  Filled 2014-07-12 (×2): qty 5

## 2014-07-12 MED ORDER — BRIMONIDINE TARTRATE 0.15 % OP SOLN
1.0000 [drp] | Freq: Two times a day (BID) | OPHTHALMIC | Status: DC
  Filled 2014-07-12: qty 5

## 2014-07-12 MED ORDER — FENTANYL CITRATE 0.05 MG/ML IJ SOLN
INTRAMUSCULAR | Status: AC
  Administered 2014-07-12: 100 ug
  Filled 2014-07-12: qty 2

## 2014-07-12 MED ORDER — DORZOLAMIDE HCL-TIMOLOL MAL 2-0.5 % OP SOLN
1.0000 [drp] | Freq: Two times a day (BID) | OPHTHALMIC | Status: DC
  Administered 2014-07-12 – 2014-07-23 (×22): 1 [drp] via OPHTHALMIC
  Filled 2014-07-12 (×2): qty 10

## 2014-07-12 MED ORDER — SODIUM PHOSPHATE 3 MMOLE/ML IV SOLN: 40.0000 meq | Freq: Once | INTRAVENOUS | Status: DC

## 2014-07-12 MED ORDER — SODIUM CHLORIDE 0.9 % IV SOLN
3.0000 g | Freq: Three times a day (TID) | INTRAVENOUS | Status: DC
  Administered 2014-07-12 – 2014-07-18 (×18): 3 g via INTRAVENOUS
  Filled 2014-07-12 (×21): qty 3

## 2014-07-12 MED ORDER — ETOMIDATE 2 MG/ML IV SOLN
20.0000 mg | Freq: Once | INTRAVENOUS | Status: AC
  Administered 2014-07-12: 20 mg via INTRAVENOUS

## 2014-07-12 MED ORDER — FUROSEMIDE 10 MG/ML IJ SOLN
40.0000 mg | Freq: Once | INTRAMUSCULAR | Status: AC
  Administered 2014-07-12: 40 mg via INTRAVENOUS
  Filled 2014-07-12: qty 4

## 2014-07-12 MED ORDER — INSULIN GLARGINE 100 UNIT/ML ~~LOC~~ SOLN
30.0000 [IU] | Freq: Every day | SUBCUTANEOUS | Status: DC
  Administered 2014-07-12 – 2014-07-13 (×2): 30 [IU] via SUBCUTANEOUS
  Filled 2014-07-12 (×3): qty 0.3

## 2014-07-12 MED ORDER — PRO-STAT SUGAR FREE PO LIQD
30.0000 mL | Freq: Two times a day (BID) | ORAL | Status: DC
Start: 2014-07-12 — End: 2014-07-13
  Administered 2014-07-12 – 2014-07-13 (×2): 30 mL
  Filled 2014-07-12 (×4): qty 30

## 2014-07-12 NOTE — Progress Notes (Addendum)
ANTICOAGULATION CONSULT NOTE - Follow Up Consult  Pharmacy Consult for Heparin  Indication: atrial fibrillation  Allergies  Allergen Reactions  . Neosporin [Neomycin-Polymyxin-Gramicidin] Other (See Comments)    unknown    Patient Measurements: Height: 5\' 7"  (170.2 cm) Weight: (!) 338 lb 10 oz (153.6 kg) IBW/kg (Calculated) : 66.1 Heparin Dosing Weight: 104 kg   Vital Signs: Temp: 99.1 F (37.3 C) (01/31 0737) Temp Source: Oral (01/31 0737) BP: 138/64 mmHg (01/31 1100) Pulse Rate: 100 (01/31 1100)  Labs:  Recent Labs  07/10/14 1209 07/10/14 1912 07/11/14 0025 07/11/14 0800 07/11/14 1830 07/12/14 0228 07/12/14 1015  HGB 13.8  --   --  13.2  --  12.8*  --   HCT 44.7  --   --  44.2  --  42.7  --   PLT 272  --   --  259  --  244  --   HEPARINUNFRC  --   --   --   --  0.16* 0.31 0.30  CREATININE 1.02  --   --  0.96  --  0.78  --   TROPONINI <0.03 <0.03 0.04* <0.03  --   --   --     Estimated Creatinine Clearance: 124.6 mL/min (by C-G formula based on Cr of 0.78).   Medical History: Past Medical History  Diagnosis Date  . GERD 07/06/2010  . Edema 10/28/2007  . DIABETES MELLITUS, TYPE II 05/04/2007  . HYPERLIPIDEMIA 07/08/2008  . ALLERGIC RHINITIS 07/08/2008  . GLAUCOMA 07/08/2008  . CEREBROVASCULAR ACCIDENT, HX OF 07/08/2008  . OBSTRUCTIVE SLEEP APNEA 03/06/2008  . DIABETIC ULCER, LEFT LEG 08/23/2009  . 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 HYPERTENSION 10/28/2007  . VENOUS INSUFFICIENCY 03/08/2009  . FATTY LIVER DISEASE 05/19/2008  . BENIGN PROSTATIC HYPERTROPHY 07/08/2008  . 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 NEPHROLITHIASIS, HX OF 07/08/2008  . Morbid obesity   . DM nephropathy/sclerosis   . 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 Lumbar spondylosis   . Diarrhea 06/08/2013    Medications:  Prescriptions prior to admission  Medication Sig Dispense Refill Last Dose  . allopurinol (ZYLOPRIM) 300 MG tablet TAKE 1 TABLET BY MOUTH EVERY DAY  30 tablet 2 07/10/2014 at Unknown time  . brimonidine (ALPHAGAN) 0.15 % ophthalmic solution Place 1 drop into both eyes 2 (two) times daily.    07/10/2014 at Unknown time  . CLARITIN-D 12 HOUR 5-120 MG per tablet TAKE 1 TABLET BY MOUTH 2 TIMES A DAY 60 tablet 11 07/10/2014 at Unknown time  . clobetasol ointment (TEMOVATE) 0.05 % Apply topically 3 (three) times daily. 60 g 4 07/10/2014 at Unknown time  . clotrimazole (MYCELEX) 10 MG troche Take 1 tablet (10 mg total) by mouth 5 (five) times daily. 35 tablet 2 07/10/2014 at Unknown time  . dexlansoprazole (DEXILANT) 60 MG capsule Take 1 capsule (60 mg total) by mouth daily. 30 capsule 6 07/10/2014 at Unknown time  . diclofenac sodium (VOLTAREN) 1 % GEL APPLY TOPICALLY 4 TIMES DAILY AS PHYSICIAN INSTRUCTED 200 g 3 07/10/2014 at Unknown time  . diltiazem (CARDIZEM CD) 180 MG 24 hr capsule TAKE 2 CAPSULES BY MOUTH DAILY 60 capsule 2 07/10/2014 at Unknown time  . dipyridamole-aspirin (AGGRENOX) 200-25 MG per 12 hr capsule TAKE 1 CAPSULE BY MOUTH 2 TIMES DAILY 60 capsule 2 07/10/2014 at Unknown time  . dorzolamide-timolol (COSOPT) 22.3-6.8 MG/ML ophthalmic solution Place 1 drop into both eyes 2 (two) times daily.    07/10/2014 at Unknown  time  . furosemide (LASIX) 80 MG tablet TAKE 1 TABLET BY MOUTH EVERY DAY 30 tablet 2 07/10/2014 at Unknown time  . Insulin NPH, Human,, Isophane, (HUMULIN N KWIKPEN) 100 UNIT/ML Kiwkpen Inject 60 Units into the skin at bedtime. 30 mL 11 07/09/2014 at Unknown time  . ketoconazole (NIZORAL) 2 % shampoo Apply 1 application topically once a week.    Past Week at Unknown time  . latanoprost (XALATAN) 0.005 % ophthalmic solution Place 1 drop into both eyes at bedtime. 2.5 mL 4 07/09/2014 at Unknown time  . lidocaine (LIDODERM) 5 % Place 1-3 patches onto the skin daily. Remove & Discard patch within 12 hours or as directed by MD 90 patch 2 07/10/2014 at Unknown time  . losartan-hydrochlorothiazide (HYZAAR) 100-25 MG per tablet TAKE 1 TABLET BY  MOUTH EVERY DAY 30 tablet 2 07/10/2014 at Unknown time  . lovastatin (MEVACOR) 40 MG tablet TAKE 1 TABLET BY MOUTH AT BEDTIME FOR CHOLESTEROL 60 tablet 4 07/09/2014 at Unknown time  . methocarbamol (ROBAXIN) 500 MG tablet Take 500 mg by mouth at bedtime. For cramping   07/09/2014 at Unknown time  . morphine (MS CONTIN) 15 MG 12 hr tablet Take 1 tablet (15 mg total) by mouth 2 (two) times daily. 60 tablet 0 07/10/2014 at Unknown time  . naproxen sodium (ANAPROX) 220 MG tablet Take 220 mg by mouth daily.    07/10/2014 at Unknown time  . NOVOLOG FLEXPEN 100 UNIT/ML FlexPen INJECT 3 TIMES PER DAY JUST BEFORE EACH MEAL (90-120-160 UNITS) (Patient taking differently: INJECT 90 UNITS WITH BREAKFAST, 120 UNITS AT LUNCH AND 160 UNITS WITH SUPPER (3 TIMES DAILY WITH MEALS)) 135 mL 1 07/10/2014 at Unknown time  . oxyCODONE-acetaminophen (PERCOCET) 7.5-325 MG per tablet Take 1 tablet by mouth every 4 (four) hours as needed for pain. (Patient taking differently: Take 1 tablet by mouth 2 (two) times daily. ) 90 tablet 0 07/10/2014 at Unknown time  . polyethylene glycol powder (GLYCOLAX/MIRALAX) powder DISSOLVE 1 CAPFUL (17GM) IN 8-10 OUNCES OF WATER & DRINK EVERY DAY 527 g 2 07/10/2014 at Unknown time  . potassium chloride SA (K-DUR,KLOR-CON) 20 MEQ tablet Take 1 tablet (20 mEq total) by mouth daily. 30 tablet 11 07/10/2014 at Unknown time  . PROVENTIL HFA 108 (90 BASE) MCG/ACT inhaler ONE PUFF EVERY 4 HOURS AS NEEDED (Patient taking differently: ONE PUFF EVERY 4 HOURS AS NEEDED FOR SHORTNESS OF BREATH) 6.7 g 5 07/10/2014 at Unknown time  . ranitidine (ZANTAC) 150 MG tablet TAKE 2 TABLETS BY MOUTH EVERY NIGHT AT BEDTIME 60 tablet 2 07/09/2014 at Unknown time  . topiramate (TOPAMAX) 50 MG tablet Take 1 tablet (50 mg total) by mouth 2 (two) times daily. 60 tablet 3 07/10/2014 at Unknown time  . zolpidem (AMBIEN) 10 MG tablet Take 1 tablet (10 mg total) by mouth at bedtime as needed. 30 tablet 5 Past Month at Unknown time  . BAYER  CONTOUR NEXT TEST test strip CHECK BLOOD SUGAR TWICE DAILY 100 each 2 Taking  . Blood Glucose Monitoring Suppl (BAYER CONTOUR NEXT MONITOR) W/DEVICE KIT 1 Device by Does not apply route once. 1 kit 0 Taking  . cyanocobalamin (,VITAMIN B-12,) 1000 MCG/ML injection Inject 1,000 mcg into the muscle every 30 (thirty) days.   Taking  . diltiazem (CARDIZEM CD) 180 MG 24 hr capsule TAKE 2 CAPSULES BY MOUTH DAILY 60 capsule 1 Taking  . docusate sodium (COLACE) 100 MG capsule TAKE 1 CAPSULE BY MOUTH 2 TIMES DAILY 60 capsule 2   .  lactulose (CHRONULAC) 10 GM/15ML solution Take 45 mLs (30 g total) by mouth 2 (two) times daily. 960 mL 11 Taking  . methocarbamol (ROBAXIN) 500 MG tablet TAKE 1 TABLET BY MOUTH 3 TIMES DAILY AS NEEDED FOR CRAMPING 90 tablet 11 Taking  . NOVOFINE 30G X 8 MM MISC USE 4 TIMES A DAY 100 each 3 Taking  . phenol (CHLORASEPTIC) 1.4 % LIQD Use as directed 1 spray in the mouth or throat as needed. 118 mL 0 Taking    Assessment: 68 YOM on IV heparin at 1800 units/hr for new Afib of unclear duration. HL this AM remains therapeutic at 0.3 but on lower end of goal range. RN reports no s/s of bleeding. Hgb and Plt ok   Goal of Therapy:  Heparin level 0.3-0.7 units/ml Monitor platelets by anticoagulation protocol: Yes   Plan:  -Increase Heparin infusion slightly to 1900 units/hr   -Monitor daily HL, CBC and s/s of bleeding   Albertina Parr, PharmD., BCPS Clinical Pharmacist Pager [phone]  Addendum: Pharmacy consulted to add Unasyn for CAP. WBC is elevated but trending down. Cultures remain negative. CrCl > 100 mL/min. Start Unasyn 3 gm IV Q 8 hours. Monitor CBC, cultures and clinical progress.   Albertina Parr, PharmD., BCPS Clinical Pharmacist Pager [phone]

## 2014-07-12 NOTE — Progress Notes (Signed)
Echocardiogram 2D Echocardiogram has been performed.  Estelle GrumblesMyers, Royale Swamy J 07/12/2014, 3:29 PM

## 2014-07-12 NOTE — Progress Notes (Signed)
PULMONARY / CRITICAL CARE MEDICINE   Name: Blake Summers MRN: [id number] DOB: January 05, 1946    ADMISSION DATE:  07/10/2014 CONSULTATION DATE:  07/10/14  REFERRING MD :  Dr. Gonzella Lexhungel  CHIEF COMPLAINT:  Shortness of breath  INITIAL PRESENTATION: Blake Summers is a 69yo man w/ PMHx of obstructive sleep apnea, obesity hypoventilation syndrome, GERD, Type 2 DM, and chronic hypercarbic respiratory failure on BiPAP at home who presented with worsening SOB for the past week. Found to be in acute on chronic hypercarbic respiratory failure.   MAJOR EVENTS/TEST RESULTS: 1/29 CT Angio Chest: No PE. Cardiomegaly, sm R pleural effusion and interstitial thickening likely CHF.  1/29 failed NPPV. Intubated 1/30 Persistent AF. UFH gtt initiated. Screened for MIND study 1/31: copious thick secretions but passed SBT. Extubated and failed several hours later. Re-intubated after failed NPPV attempt. UA looked very raw and edematous on re-intubation. Abx started for presumed PNA  INDWELLING DEVICES:: ETT 1/29 >>   MICRO DATA: Resp 1/30  >>  Resp 1/31>> Blood 1/29 >>  MRSA 1/29>>neg  Flu 1/29   ANTIMICROBIALS:  None   SUBJECTIVE:  RASS 0. ETT   VITAL SIGNS: Temp:  [98.5 F (36.9 C)-99.5 F (37.5 C)] 99.1 F (37.3 C) (01/31 0737) Pulse Rate:  [78-111] 91 (01/31 0810) Resp:  [13-21] 13 (01/31 0810) BP: (116-164)/(65-91) 116/65 mmHg (01/31 0810) SpO2:  [90 %-98 %] 96 % (01/31 0818) FiO2 (%):  [40 %-60 %] 40 % (01/31 0818) Weight:  [338 lb 10 oz (153.6 kg)] 338 lb 10 oz (153.6 kg) (01/31 0500) HEMODYNAMICS:   Vent Mode:  [-] CPAP;PSV FiO2 (%):  [40 %-60 %] 40 % Set Rate:  [18 bmp] 18 bmp Vt Set:  [500 mL] 500 mL PEEP:  [5 cmH20] 5 cmH20 Pressure Support:  [5 cmH20] 5 cmH20 Plateau Pressure:  [21 cmH20-28 cmH20] 23 cmH20 INTAKE / OUTPUT:  Intake/Output Summary (Last 24 hours) at 07/12/14 0917 Last data filed at 07/12/14 0700  Gross per 24 hour  Intake 943.81 ml  Output   1525 ml  Net  -581.19 ml    PHYSICAL EXAMINATION: General: obese, plethoric, NAD Neuro: no focal deficits, moving all 4 ext, following commands, eyes open HEENT: [place]/AT Cardiovascular: irreg rate controlled<110, no murmurs  Lungs: course breath sounds b/l Abdomen: obese, edematous Ext: 1-2+ pitting edema in lower extremities Skin: chronic venous skin changes in lower extremities, erythema   LABS: CMP Latest Ref Rng 07/12/2014 07/11/2014 07/10/2014  Glucose 70 - 99 mg/dL 409(W285(H) 119(J208(H) 478(G300(H)  BUN 6 - 23 mg/dL 17 16 16   Creatinine 0.50 - 1.35 mg/dL 9.560.78 2.130.96 0.861.02  Sodium 135 - 145 mmol/L 138 139 135  Potassium 3.5 - 5.1 mmol/L 3.8 3.8 3.4(L)  Chloride 96 - 112 mmol/L 95(L) 94(L) 91(L)  CO2 19 - 32 mmol/L 34(H) 36(H) 38(H)  Calcium 8.4 - 10.5 mg/dL 8.5 8.6 8.8  Total Protein 6.0 - 8.3 g/dL 6.1 6.6 -  Total Bilirubin 0.3 - 1.2 mg/dL 0.5 0.6 -  Alkaline Phos 39 - 117 U/L 83 94 -  AST 0 - 37 U/L 16 18 -  ALT 0 - 53 U/L 14 15 -   CBC Latest Ref Rng 07/12/2014 07/11/2014 07/10/2014  WBC 4.0 - 10.5 K/uL 11.1(H) 15.1(H) 13.1(H)  Hemoglobin 13.0 - 17.0 g/dL 12.8(L) 13.2 13.8  Hematocrit 39.0 - 52.0 % 42.7 44.2 44.7  Platelets 150 - 400 K/uL 244 259 272    CXR:  07/12/2014  FINDINGS: An endotracheal tube is in place  with tip 5.1 cm above the carina. Nasogastric tube in position, however, the distal aspect of the tube cannot be visualized secondary to underpenetration of the film related to the patient's large body habitus. Lung volumes are low. Bibasilar opacities (left much greater than right), in large part related to areas of atelectasis, with superimposed moderate left pleural effusion (which has decreased compared to the prior examination). There is cephalization of the pulmonary vasculature and slight indistinctness of the interstitial markings suggestive of mild pulmonary edema. Improved aeration throughout the left lung, presumably related to resolving atelectasis, suggesting resolving mucous  plugging. Mild cardiomegaly. The patient is rotated to the left on today's exam, resulting in distortion of the mediastinal contours and reduced diagnostic sensitivity and specificity for mediastinal pathology.  IMPRESSION: 1. The appearance the chest suggests a background of congestive heart failure, as above. 2. Improving aeration in the left hemithorax, presumably largely related to resolving atelectasis, presumably from resolving mucous plugging.  ASSESSMENT / PLAN:  PULMONARY A:  ETT 1/29>> Acute on chronic hypercarbic respiratory failure OSA/OHS Mucous plugging resolving per CXR 1/31  Copious thick tan blood tinged secretions from ETT ?tracheal bronchitis  Mild pulm edema  Mod L pleural effusion (decreased) Left hemithorax  L chest white out - likely atx and/or effusion Remote smoking P:   Cont full vent support  Daily SBT if/when meets criteria-RT will not do now due to increased secretions  Will try to extubate today Follow CXR and ABG in am  recultured secretions. Added Unasyn  Prn Alb, Pulmicort 0.25 q6, Duoneb q6    CARDIOVASCULAR A:  H/O HTN, HLD AF rate controlled, CVR - unclear duration Mild cardiomegaly  P:  Heparin gtt . Aspirin 81 per tube. Trops negative  Cont hydralazine prn q4 SBP >170 Cont metoprolol prn 2.5-5 mg q3 prn HR >115 Echocardiogram ordered 1/30 pending  Lasix 40 x 1   RENAL A:   Hypokalemia 3.8  Phos 1.8  P:   Monitor BMET (replete electrolytes prn) 40 meq K bid x 2 dose today; sodium phos 40 meQ x 1. Check Mag, check Phos Goal K >4, Mag >2  Monitor I/Os, daily wts  Lasix 40 mg x 1    GASTROINTESTINAL A:   GERD, chronic PPI use NPO with TF  P:   Protonix 40 mg iv qd  NPO after extubation and diet later if tolerates extubation   HEMATOLOGIC A:   Normocytic anemia Hbg stable  P:  DVT px: full dose heparin for AF Monitor CBC intermittently Transfuse per usual ICU guidelines   INFECTIOUS A:  No definite  infections identified Leukocytosis ?tracheal bronchitis  Abx Rocephin 1/29>>1/29 1 dose, AZM 1/29>>1/29 1 dose Unasyn 1/31>> P:   Micro and abx as above Trend CBC, PCT, follow cultures  Consider Abx added Unasyn today   ENDOCRINE A:   Type 2 DM with hyperglycemia (HA1C 8.4 05/27/14)  Low tsh ? Sick euthyroid  P:   Cont Lantus 30  Cont SSI-R  Will check free T4   NEUROLOGIC A:   Acute encephalopathy improving  Awake following commands   P:   RASS goal:0  PAD protocol Fentanyl gtt @ 5, prn Fentanyl    FAMILY  - Updates: last 1/29 will update as become available    Desma Maxim MD [phone]  07/12/14   PCCM ATTENDING: I have reviewed pt's initial presentation, consultants notes and hospital database in detail.  The above assessment and plan was formulated under my direction.  In summary:  Failed attempt @ extubation today. His airway looks horrible. Would probably benefit from trach. Rest as above    Billy Fischer, MD;  PCCM service; Mobile [phone]

## 2014-07-12 NOTE — Procedures (Signed)
Central Venous Catheter Insertion Procedure Note Blake SalenJoseph L Summers [id number] 1946-02-13  Procedure: Insertion of Central Venous Catheter Indications: Drug and/or fluid administration  Procedure Details Consent: Unable to obtain consent because of emergent medical necessity. Time Out: Verified patient identification, verified procedure, site/side was marked, verified correct patient position, special equipment/implants available, medications/allergies/relevent history reviewed, required imaging and test results available.  Performed  Maximum sterile technique was used including antiseptics, cap, gloves, gown, hand hygiene, mask and sheet. Skin prep: Chlorhexidine; local anesthetic administered A antimicrobial bonded/coated triple lumen catheter was placed in the left subclavian vein using the Seldinger technique.  Evaluation Blood flow good Complications: No apparent complications Patient did tolerate procedure well. Chest X-ray ordered to verify placement.  CXR: pending.   Billy Fischeravid Rinoa Garramone, MD ; [facility] service Mobile [phone].  After 5:30 PM or weekends, call [phone]

## 2014-07-12 NOTE — Procedures (Signed)
Intubation Procedure Note Blake Summers [id number] [birth year]  Procedure: Intubation Indications: Respiratory insufficiency  Procedure Details Consent: Unable to obtain consent because of emergent medical necessity. Time Out: Verified patient identification, verified procedure, site/side was marked, verified correct patient position, special equipment/implants available, medications/allergies/relevent history reviewed, required imaging and test results available.  Performed  4 Glidescope Very difficult airway UA very edematous and raw, beefy    Evaluation Hemodynamic Status: BP stable throughout; O2 sats: transiently fell during during procedure Patient's Current Condition: stable Complications: No apparent complications Patient did tolerate procedure well. Chest X-ray ordered to verify placement.  CXR: pending.   Billy Fischeravid Stryker Veasey, MD ; [facility] Mobile [phone].  After 5:30 PM or weekends, call [phone]

## 2014-07-12 NOTE — Progress Notes (Signed)
ANTICOAGULATION CONSULT NOTE - Follow Up Consult  Pharmacy Consult for heparin Indication: atrial fibrillation  Labs:  Recent Labs  07/10/14 1209 07/10/14 1912 07/11/14 0025 07/11/14 0800 07/11/14 1830 07/12/14 0228  HGB 13.8  --   --  13.2  --  12.8*  HCT 44.7  --   --  44.2  --  42.7  PLT 272  --   --  259  --  244  HEPARINUNFRC  --   --   --   --  0.16* 0.31  CREATININE 1.02  --   --  0.96  --   --   TROPONINI <0.03 <0.03 0.04* <0.03  --   --      Assessment: 69yo male therapeutic on heparin after rate increase though at very low end of goal.  Goal of Therapy:  Heparin level 0.3-0.7 units/ml   Plan:  Will increase heparin gtt slightly to 1800 units/hr to help stay above goal and recheck level in 6hr.  Vernard GamblesVeronda Yaphet Smethurst, PharmD, BCPS  07/12/2014,3:43 AM

## 2014-07-12 NOTE — Procedures (Signed)
Extubation Procedure Note  Patient Details:   Name: Blake Summers DOB: [birth year] MRN: [id number]   Airway Documentation:  Airway 8 mm (Active)  Secured at (cm) 23 cm 07/12/2014  8:10 AM  Measured From Lips 07/12/2014  8:10 AM  Secured Location Center 07/12/2014  8:10 AM  Secured By [employer] Tube Holder 07/12/2014  8:10 AM  Tube Holder Repositioned Yes 07/12/2014  8:10 AM  Cuff Pressure (cm H2O) 25 cm H2O 07/11/2014  7:58 PM  Site Condition Dry 07/12/2014  7:38 AM    Evaluation  O2 sats: stable throughout Complications: No apparent complications Patient did tolerate procedure well. Bilateral Breath Sounds: Diminished Suctioning: Oral Yes  Pt extubated per MD order. Pt placed on 4lpm [place]. Sp02 97%  Blake Summers, Blake Summers 07/12/2014, 12:13 PM

## 2014-07-13 ENCOUNTER — Inpatient Hospital Stay (HOSPITAL_COMMUNITY): Payer: Medicare Other

## 2014-07-13 DIAGNOSIS — J9622 Acute and chronic respiratory failure with hypercapnia: Principal | ICD-10-CM

## 2014-07-13 DIAGNOSIS — E662 Morbid (severe) obesity with alveolar hypoventilation: Secondary | ICD-10-CM

## 2014-07-13 DIAGNOSIS — G4733 Obstructive sleep apnea (adult) (pediatric): Secondary | ICD-10-CM

## 2014-07-13 LAB — BLOOD GAS, ARTERIAL
Acid-Base Excess: 10.3 mmol/L — ABNORMAL HIGH (ref 0.0–2.0)
Bicarbonate: 35.3 mEq/L — ABNORMAL HIGH (ref 20.0–24.0)
Drawn by: 25203
FIO2: 0.6 %
LHR: 18 {breaths}/min
MECHVT: 500 mL
O2 SAT: 95.5 %
PCO2 ART: 55.9 mmHg — AB (ref 35.0–45.0)
PEEP/CPAP: 5 cmH2O
Patient temperature: 98.6
TCO2: 37 mmol/L (ref 0–100)
pH, Arterial: 7.416 (ref 7.350–7.450)
pO2, Arterial: 78.7 mmHg — ABNORMAL LOW (ref 80.0–100.0)

## 2014-07-13 LAB — BASIC METABOLIC PANEL
ANION GAP: 6 (ref 5–15)
BUN: 16 mg/dL (ref 6–23)
CO2: 36 mmol/L — ABNORMAL HIGH (ref 19–32)
CREATININE: 0.72 mg/dL (ref 0.50–1.35)
Calcium: 8.5 mg/dL (ref 8.4–10.5)
Chloride: 99 mmol/L (ref 96–112)
GFR calc Af Amer: >90 mL/min (ref >90)
GFR calc non Af Amer: >90 mL/min (ref >90)
GLUCOSE: 239 mg/dL — AB (ref 70–99)
Potassium: 3.8 mmol/L (ref 3.5–5.1)
Sodium: 141 mmol/L (ref 135–145)

## 2014-07-13 LAB — GLUCOSE, CAPILLARY
GLUCOSE-CAPILLARY: 165 mg/dL — AB (ref 70–99)
GLUCOSE-CAPILLARY: 200 mg/dL — AB (ref 70–99)
Glucose-Capillary: 233 mg/dL — ABNORMAL HIGH (ref 70–99)
Glucose-Capillary: 220 mg/dL — ABNORMAL HIGH (ref 70–99)
Glucose-Capillary: 153 mg/dL — ABNORMAL HIGH (ref 70–99)
Glucose-Capillary: 178 mg/dL — ABNORMAL HIGH (ref 70–99)

## 2014-07-13 LAB — CBC
HEMATOCRIT: 41.3 % (ref 39.0–52.0)
HEMOGLOBIN: 12.4 g/dL — AB (ref 13.0–17.0)
MCH: 27.4 pg (ref 26.0–34.0)
MCHC: 30 g/dL (ref 30.0–36.0)
MCV: 91.2 fL (ref 78.0–100.0)
PLATELETS: 233 10*3/uL (ref 150–400)
RBC: 4.53 MIL/uL (ref 4.22–5.81)
RDW: 15.8 % — ABNORMAL HIGH (ref 11.5–15.5)
WBC: 9.6 10*3/uL (ref 4.0–10.5)

## 2014-07-13 LAB — HEPARIN LEVEL (UNFRACTIONATED)
HEPARIN UNFRACTIONATED: 0.29 [IU]/mL — AB (ref 0.30–0.70)
Heparin Unfractionated: 0.25 IU/mL — ABNORMAL LOW (ref 0.30–0.70)
Heparin Unfractionated: 0.5 IU/mL (ref 0.30–0.70)

## 2014-07-13 LAB — PHOSPHORUS
PHOSPHORUS: 1.8 mg/dL — AB (ref 2.3–4.6)
Phosphorus: 2.2 mg/dL — ABNORMAL LOW (ref 2.3–4.6)

## 2014-07-13 LAB — CULTURE, RESPIRATORY W GRAM STAIN

## 2014-07-13 LAB — PROCALCITONIN: Procalcitonin: 0.25 ng/mL

## 2014-07-13 LAB — MAGNESIUM
Magnesium: 2.2 mg/dL (ref 1.5–2.5)
Magnesium: 2.2 mg/dL (ref 1.5–2.5)

## 2014-07-13 LAB — CULTURE, RESPIRATORY

## 2014-07-13 MED ORDER — PRO-STAT SUGAR FREE PO LIQD
60.0000 mL | Freq: Three times a day (TID) | ORAL | Status: DC
  Administered 2014-07-13 – 2014-07-16 (×5): 60 mL
  Filled 2014-07-13 (×15): qty 60

## 2014-07-13 MED ORDER — VITAL HIGH PROTEIN PO LIQD
1000.0000 mL | ORAL | Status: DC
  Administered 2014-07-13: 1000 mL
  Administered 2014-07-14: 04:00:00
  Administered 2014-07-14: 1000 mL
  Administered 2014-07-14 (×2)
  Administered 2014-07-15: 1000 mL
  Administered 2014-07-16 (×5)
  Administered 2014-07-16: 1000 mL
  Filled 2014-07-13 (×6): qty 1000

## 2014-07-13 NOTE — Progress Notes (Signed)
ANTICOAGULATION CONSULT NOTE - Follow-up Consult  Pharmacy Consult for Heparin  Indication: atrial fibrillation  Allergies  Allergen Reactions  . Neosporin [Neomycin-Polymyxin-Gramicidin] Other (See Comments)    unknown    Patient Measurements: Height: 5\' 7"  (170.2 cm) Weight: (!) 326 lb 1 oz (147.9 kg) IBW/kg (Calculated) : 66.1 Heparin Dosing Weight: 104 kg (calculated based on admit weight)  Vital Signs: Temp: 98.1 F (36.7 C) (02/01 1613) Temp Source: Oral (02/01 1613) BP: 147/96 mmHg (02/01 1800) Pulse Rate: 93 (02/01 1800)  Labs:  Recent Labs  07/11/14 0025  07/11/14 0800  07/12/14 0228  07/13/14 0349 07/13/14 0350 07/13/14 0945 07/13/14 1800  HGB  --   < > 13.2  --  12.8*  --   --  12.4*  --   --   HCT  --   --  44.2  --  42.7  --   --  41.3  --   --   PLT  --   --  259  --  244  --   --  233  --   --   HEPARINUNFRC  --   --   --   < > 0.31  < > 0.25*  --  0.29* 0.50  CREATININE  --   --  0.96  --  0.78  --   --  0.72  --   --   TROPONINI 0.04*  --  <0.03  --   --   --   --   --   --   --   < > = values in this interval not displayed.  Estimated Creatinine Clearance: 121.8 mL/min (by C-G formula based on Cr of 0.72).  Assessment: 68 YOM to start on IV heparin for new Afib of unclear duration.   Currently heparin level 0.5 is therapeutic at 2300 units/hr.  CBC is stable. No bleeding reported.   Goal of Therapy:  Heparin level 0.3-0.7 units/ml Monitor platelets by anticoagulation protocol: Yes   Plan:  Continue heparin to 2300 units/hr.   Daily heparin level and CBC while on therapy.   Leota SauersLisa Lyndsy Gilberto Pharm.D. CPP, BCPS Clinical Pharmacist [phone] 07/13/2014 7:53 PM

## 2014-07-13 NOTE — Progress Notes (Signed)
PULMONARY / CRITICAL CARE MEDICINE   Name: Blake Summers MRN: [id number] DOB: 1945-12-05    ADMISSION DATE:  07/10/2014 CONSULTATION DATE:  07/10/14  REFERRING MD :  Dr. Gonzella Lexhungel  CHIEF COMPLAINT:  Shortness of breath  INITIAL PRESENTATION: Blake Summers is a 69yo man w/ PMHx of obstructive sleep apnea, obesity hypoventilation syndrome, GERD, Type 2 DM, and chronic hypercarbic respiratory failure on BiPAP at home who presented with worsening SOB for the past week. Found to be in acute on chronic hypercarbic respiratory failure.   MAJOR EVENTS/TEST RESULTS: 1/29 CT Angio Chest: No PE. Cardiomegaly, sm R pleural effusion and interstitial thickening likely CHF.  1/29 failed NPPV. Intubated 1/30 Persistent AF. UFH gtt initiated. Screened for MIND study 1/31: copious thick secretions but passed SBT. Extubated and failed several hours later. Re-intubated after failed NPPV attempt. UA looked very raw and edematous on re-intubation. Abx started for presumed PNA  INDWELLING DEVICES:: ETT 1/29 >>   MICRO DATA: Resp 1/30  >>  Resp 1/31>> Blood 1/29 >>  MRSA 1/29>>neg  Flu 1/29   ANTIMICROBIALS:  None   SUBJECTIVE:  Awake and interacting wants the ETT out asap  VITAL SIGNS: Temp:  [98.1 F (36.7 C)-98.9 F (37.2 C)] 98.1 F (36.7 C) (02/01 1613) Pulse Rate:  [72-108] 86 (02/01 1500) Resp:  [16-22] 18 (02/01 1500) BP: (112-145)/(50-77) 132/71 mmHg (02/01 1549) SpO2:  [92 %-98 %] 96 % (02/01 1500) FiO2 (%):  [45 %-60 %] 45 % (02/01 1549) Weight:  [147.9 kg (326 lb 1 oz)] 147.9 kg (326 lb 1 oz) (02/01 0600) HEMODYNAMICS:   Vent Mode:  [-] PRVC FiO2 (%):  [45 %-60 %] 45 % Set Rate:  [18 bmp] 18 bmp Vt Set:  [500 mL] 500 mL PEEP:  [5 cmH20] 5 cmH20 Plateau Pressure:  [21 cmH20-22 cmH20] 22 cmH20 INTAKE / OUTPUT:  Intake/Output Summary (Last 24 hours) at 07/13/14 1614 Last data filed at 07/13/14 1500  Gross per 24 hour  Intake 2254.17 ml  Output   1540 ml  Net 714.17 ml     PHYSICAL EXAMINATION: General: obese, plethoric, NAD Neuro: no focal deficits, moving all 4 ext, following commands, eyes open HEENT: [place]/AT Cardiovascular: irreg rate controlled<110, no murmurs  Lungs: course breath sounds b/l Abdomen: obese, edematous Ext: 1-2+ pitting edema in lower extremities Skin: chronic venous skin changes in lower extremities, erythema   LABS: CMP Latest Ref Rng 07/13/2014 07/12/2014 07/11/2014  Glucose 70 - 99 mg/dL 409(W239(H) 119(J285(H) 478(G208(H)  BUN 6 - 23 mg/dL 16 17 16   Creatinine 0.50 - 1.35 mg/dL 9.560.72 2.130.78 0.860.96  Sodium 135 - 145 mmol/L 141 138 139  Potassium 3.5 - 5.1 mmol/L 3.8 3.8 3.8  Chloride 96 - 112 mmol/L 99 95(L) 94(L)  CO2 19 - 32 mmol/L 36(H) 34(H) 36(H)  Calcium 8.4 - 10.5 mg/dL 8.5 8.5 8.6  Total Protein 6.0 - 8.3 g/dL - 6.1 6.6  Total Bilirubin 0.3 - 1.2 mg/dL - 0.5 0.6  Alkaline Phos 39 - 117 U/L - 83 94  AST 0 - 37 U/L - 16 18  ALT 0 - 53 U/L - 14 15   CBC Latest Ref Rng 07/13/2014 07/12/2014 07/11/2014  WBC 4.0 - 10.5 K/uL 9.6 11.1(H) 15.1(H)  Hemoglobin 13.0 - 17.0 g/dL 12.4(L) 12.8(L) 13.2  Hematocrit 39.0 - 52.0 % 41.3 42.7 44.2  Platelets 150 - 400 K/uL 233 244 259    CXR:  07/12/2014  FINDINGS: An endotracheal tube is in place with tip 5.1  cm above the carina. Nasogastric tube in position, however, the distal aspect of the tube cannot be visualized secondary to underpenetration of the film related to the patient's large body habitus. Lung volumes are low. Bibasilar opacities (left much greater than right), in large part related to areas of atelectasis, with superimposed moderate left pleural effusion (which has decreased compared to the prior examination). There is cephalization of the pulmonary vasculature and slight indistinctness of the interstitial markings suggestive of mild pulmonary edema. Improved aeration throughout the left lung, presumably related to resolving atelectasis, suggesting resolving mucous plugging. Mild  cardiomegaly. The patient is rotated to the left on today's exam, resulting in distortion of the mediastinal contours and reduced diagnostic sensitivity and specificity for mediastinal pathology.  IMPRESSION: 1. The appearance the chest suggests a background of congestive heart failure, as above. 2. Improving aeration in the left hemithorax, presumably largely related to resolving atelectasis, presumably from resolving mucous plugging.  ASSESSMENT / PLAN:  PULMONARY A:  ETT 1/29>> 1/31, 1/31 >>  Acute on chronic hypercarbic respiratory failure OSA/OHS Mucous plugging resolving per CXR 1/31  Copious thick tan blood tinged secretions from ETT ?tracheal bronchitis  Mild pulm edema  Mod L pleural effusion (decreased) L chest white out - likely atx and/or effusion Remote smoking P:   Failed extubation; discussed trach with wife, son and pt today. They are in agreement that this is the best plan. Consulted Dr Suszanne Conners with ENT to schedule.  recultured secretions. Added Unasyn  Prn Alb, Pulmicort 0.25 q6, Duoneb q6    CARDIOVASCULAR A:  H/O HTN, HLD AF rate controlled, CVR - unclear duration Mild cardiomegaly  P:  Heparin gtt . Aspirin 81 per tube. Cont hydralazine prn q4 SBP >170 Cont metoprolol prn 2.5-5 mg q3 prn HR >115 Echocardiogram  > poor study, no good windows  RENAL A:   Hypokalemia 3.8  Phos 1.8  P:   Monitor BMET (replete electrolytes prn)   Goal K >4, Mag >2  Monitor I/Os, daily wts    GASTROINTESTINAL A:   GERD, chronic PPI use NPO with TF  P:   Protonix 40 mg iv qd   HEMATOLOGIC A:   Normocytic anemia Hbg stable  P:  DVT px: full dose heparin for AF Monitor CBC intermittently Transfuse per usual ICU guidelines   INFECTIOUS A:  No definite infections identified Leukocytosis ?tracheal bronchitis  Abx Rocephin 1/29>>1/29 1 dose, AZM 1/29>>1/29 1 dose Unasyn 1/31>> P:   Micro and abx as above Trend CBC, PCT, follow cultures  Consider Abx  added Unasyn 1/31  ENDOCRINE A:   Type 2 DM with hyperglycemia (HA1C 8.4 05/27/14)  Low tsh ? Sick euthyroid; free T4 normal P:   Cont Lantus 30  Cont SSI-R   NEUROLOGIC A:   Acute encephalopathy, fully resolved Awake following commands   P:   RASS goal:0    FAMILY  - Updates: Updated pt, son, wife on 2/1   Levy Pupa, MD, PhD 07/13/2014, 4:25 PM  Pulmonary and Critical Care [phone] or if no answer [phone]

## 2014-07-13 NOTE — Progress Notes (Signed)
ANTICOAGULATION CONSULT NOTE - Follow-up Consult  Pharmacy Consult for Heparin  Indication: atrial fibrillation  Allergies  Allergen Reactions  . Neosporin [Neomycin-Polymyxin-Gramicidin] Other (See Comments)    unknown    Patient Measurements: Height: 5\' 7"  (170.2 cm) Weight: (!) 326 lb 1 oz (147.9 kg) IBW/kg (Calculated) : 66.1 Heparin Dosing Weight: 104 kg (calculated based on admit weight)  Vital Signs: Temp: 98.7 F (37.1 C) (02/01 0350) Temp Source: Oral (02/01 0350) BP: 121/69 mmHg (02/01 1000) Pulse Rate: 94 (02/01 1000)  Labs:  Recent Labs  07/10/14 1912 07/11/14 0025 07/11/14 0800  07/12/14 0228 07/12/14 1015 07/13/14 0349 07/13/14 0350 07/13/14 0945  HGB  --   --  13.2  --  12.8*  --   --  12.4*  --   HCT  --   --  44.2  --  42.7  --   --  41.3  --   PLT  --   --  259  --  244  --   --  233  --   HEPARINUNFRC  --   --   --   < > 0.31 0.30 0.25*  --  0.29*  CREATININE  --   --  0.96  --  0.78  --   --  0.72  --   TROPONINI <0.03 0.04* <0.03  --   --   --   --   --   --   < > = values in this interval not displayed.  Estimated Creatinine Clearance: 121.8 mL/min (by C-G formula based on Cr of 0.72).  Assessment: 68 YOM to start on IV heparin for new Afib of unclear duration.   Currently heparin is slightly sub-therapeutic at 2100 units/hr.  CBC is stable. No bleeding reported.   Goal of Therapy:  Heparin level 0.3-0.7 units/ml Monitor platelets by anticoagulation protocol: Yes   Plan:  Increase heparin to 2300 units/hr.  Recheck heparin level in 6 hours to confirm.  Daily heparin level and CBC while on therapy.   Link SnufferJessica Howell Groesbeck, PharmD, BCPS Clinical Pharmacist [phone] 07/13/2014, 11:46 AM

## 2014-07-13 NOTE — Progress Notes (Signed)
UR Completed.  [phone]  

## 2014-07-13 NOTE — Progress Notes (Signed)
ANTICOAGULATION CONSULT NOTE - Follow Up Consult  Pharmacy Consult for heparin Indication: atrial fibrillation  Labs:  Recent Labs  07/10/14 1209 07/10/14 1912 07/11/14 0025 07/11/14 0800  07/12/14 0228 07/12/14 1015 07/13/14 0349 07/13/14 0350  HGB 13.8  --   --  13.2  --  12.8*  --   --  12.4*  HCT 44.7  --   --  44.2  --  42.7  --   --  41.3  PLT 272  --   --  259  --  244  --   --  233  HEPARINUNFRC  --   --   --   --   < > 0.31 0.30 0.25*  --   CREATININE 1.02  --   --  0.96  --  0.78  --   --   --   TROPONINI <0.03 <0.03 0.04* <0.03  --   --   --   --   --   < > = values in this interval not displayed.   Assessment: 69yo male now subtherapeutic on heparin after rate increase for low-goal levels.  Goal of Therapy:  Heparin level 0.3-0.7 units/ml   Plan:  Will increase heparin gtt by ~1 unit/kg/hr to 2100 units/hr to and recheck level in 6hr.  Vernard GamblesVeronda Keyoni Lapinski, PharmD, BCPS  07/13/2014,4:22 AM

## 2014-07-13 NOTE — Progress Notes (Signed)
NUTRITION FOLLOW UP  Intervention:   Resume TF via OG with Vital High Protein at goal rate of 40 ml/h (960 ml per day) with Prostat 60ml TID to provide 1560 kcal, 174 g protein, 802 ml of water daily.   Nutrition Dx:   Inadequate oral intake related to inability to eat as evidenced by NPO status  Goal:   Enteral nutrition to provide 60-70% of estimated calorie needs (22-25 kcals/kg ideal body weight) and 100% estimated protein needs, based on ASPEN guidelines for hypocaloric, high protein feeding in critically ill individuals.   Monitor:   TF tolerance/adquacy, weight trends, vent status  Assessment:   69yo man w/ PMHx of obstructive sleep apnea, obesity hypoventilation syndrome, GERD, Type 2 DM, and chronic hypercarbic respiratory failure on BiPAP at home who presented with worsening SOB for the past week PTA.    Pt failed extubation and re intubated 1/31.  Pt has been receiving VHP at 40 ml/hr with Prostat 60ml TID. Tube feeding on hold since last night in anticipation of trach today. Unsure of timing of trach placement. RD discussed pt in ICU rounds today.  Plans to resume tube feeding today.  Pt currently intubated on ventilator support MV: 10.1 mL Temp (24hrs), 98.7(37.1)     Height: Ht Readings from Last 1 Encounters:  07/10/14  (1.702 m)    Weight Status:   Wt Readings from Last 1 Encounters:  07/13/14 326 lb 1 oz (147.9 kg)   BMI: Body mass index is 52.99 kg/(m^2).  Re-estimated needs:  Kcal: 2279 kcals (1450-1670 = 22-25 kcal/kg IBW) Protein: >/=168 Fluid: per MD  Skin: Stage II pressure ulcer on both feet  Diet Order:     Intake/Output Summary (Last 24 hours) at 07/13/14 1348 Last data filed at 07/13/14 1000  Gross per 24 hour  Intake 1984.47 ml  Output   2440 ml  Net -455.53 ml    Last BM: PTA   Labs:   Recent Labs Lab 07/11/14 0800 07/12/14 0228 07/12/14 1000 07/12/14 1642 07/13/14 0350  NA 139 138  --   --  141  K 3.8 3.8  --   --   3.8  CL 94* 95*  --   --  99  CO2 36* 34*  --   --  36*  BUN 16 17  --   --  16  CREATININE 0.96 0.78  --   --  0.72  CALCIUM 8.6 8.5  --   --  8.5  MG  --   --  2.2 2.3 2.2  PHOS  --   --  1.8* 3.8 1.8*  GLUCOSE 208* 285*  --   --  239*    CBG (last 3)   Recent Labs  07/13/14 0347 07/13/14 0736 07/13/14 1320  GLUCAP 220* 153* 178*    Scheduled Meds: . ampicillin-sulbactam (UNASYN) IV  3 g Intravenous Q8H  . antiseptic oral rinse  7 mL Mouth Rinse QID  . aspirin  81 mg Oral Daily  . brimonidine  1 drop Both Eyes BID  . budesonide  0.25 mg Nebulization 4 times per day  . chlorhexidine  15 mL Mouth Rinse BID  . dorzolamide-timolol  1 drop Both Eyes BID  . feeding supplement (PRO-STAT SUGAR FREE 64)  30 mL Per Tube BID  . feeding supplement (VITAL HIGH PROTEIN)  1,000 mL Per Tube Q24H  . insulin aspart  0-20 Units Subcutaneous 6 times per day  . insulin glargine  30 Units  Subcutaneous QHS  . ipratropium-albuterol  3 mL Nebulization Q6H  . latanoprost  1 drop Both Eyes QHS  . pantoprazole (PROTONIX) IV  40 mg Intravenous Q24H    Continuous Infusions: . fentaNYL infusion INTRAVENOUS 150 mcg/hr (07/13/14 1156)  . heparin 2,300 Units/hr (07/13/14 1147)    Magdalen SpatzLauren Leimomi Zervas MS Dietetic Intern Pager Number [phone]

## 2014-07-14 ENCOUNTER — Encounter (HOSPITAL_COMMUNITY)
Admission: EM | Disposition: A | Discharge status: Skilled Nursing Facility | Payer: Self-pay | Source: Home / Self Care | Attending: Emergency Medicine

## 2014-07-14 ENCOUNTER — Encounter (HOSPITAL_COMMUNITY): Payer: Self-pay | Admitting: Certified Registered Nurse Anesthetist

## 2014-07-14 ENCOUNTER — Inpatient Hospital Stay (HOSPITAL_COMMUNITY): Payer: Medicare Other | Admitting: Certified Registered"

## 2014-07-14 HISTORY — PX: TRACHEOSTOMY TUBE PLACEMENT: SHX814

## 2014-07-14 LAB — BASIC METABOLIC PANEL
ANION GAP: 5 (ref 5–15)
BUN: 18 mg/dL (ref 6–23)
CALCIUM: 8.7 mg/dL (ref 8.4–10.5)
CO2: 35 mmol/L — AB (ref 19–32)
CREATININE: 0.64 mg/dL (ref 0.50–1.35)
Chloride: 102 mmol/L (ref 96–112)
Glucose, Bld: 250 mg/dL — ABNORMAL HIGH (ref 70–99)
Potassium: 3.8 mmol/L (ref 3.5–5.1)
Sodium: 142 mmol/L (ref 135–145)

## 2014-07-14 LAB — GLUCOSE, CAPILLARY
GLUCOSE-CAPILLARY: 206 mg/dL — AB (ref 70–99)
GLUCOSE-CAPILLARY: 230 mg/dL — AB (ref 70–99)
Glucose-Capillary: 184 mg/dL — ABNORMAL HIGH (ref 70–99)
Glucose-Capillary: 189 mg/dL — ABNORMAL HIGH (ref 70–99)
Glucose-Capillary: 221 mg/dL — ABNORMAL HIGH (ref 70–99)
Glucose-Capillary: 228 mg/dL — ABNORMAL HIGH (ref 70–99)
Glucose-Capillary: 241 mg/dL — ABNORMAL HIGH (ref 70–99)

## 2014-07-14 LAB — CBC
HCT: 42.5 % (ref 39.0–52.0)
HEMOGLOBIN: 12.5 g/dL — AB (ref 13.0–17.0)
MCH: 27.4 pg (ref 26.0–34.0)
MCHC: 29.4 g/dL — ABNORMAL LOW (ref 30.0–36.0)
MCV: 93 fL (ref 78.0–100.0)
Platelets: 225 10*3/uL (ref 150–400)
RBC: 4.57 MIL/uL (ref 4.22–5.81)
RDW: 15.8 % — ABNORMAL HIGH (ref 11.5–15.5)
WBC: 9 10*3/uL (ref 4.0–10.5)

## 2014-07-14 LAB — HEPARIN LEVEL (UNFRACTIONATED): Heparin Unfractionated: 0.3 IU/mL (ref 0.30–0.70)

## 2014-07-14 LAB — CULTURE, RESPIRATORY W GRAM STAIN

## 2014-07-14 LAB — CULTURE, RESPIRATORY

## 2014-07-14 LAB — PROCALCITONIN: PROCALCITONIN: 0.17 ng/mL

## 2014-07-14 SURGERY — CREATION, TRACHEOSTOMY
Anesthesia: General | Site: Neck

## 2014-07-14 MED ORDER — PHENYLEPHRINE HCL 10 MG/ML IJ SOLN
INTRAMUSCULAR | Status: DC | PRN
  Administered 2014-07-14 (×2): 40 ug via INTRAVENOUS

## 2014-07-14 MED ORDER — FENTANYL CITRATE 0.05 MG/ML IJ SOLN
INTRAMUSCULAR | Status: AC
  Filled 2014-07-14: qty 5

## 2014-07-14 MED ORDER — MIDAZOLAM HCL 2 MG/2ML IJ SOLN
INTRAMUSCULAR | Status: AC
  Filled 2014-07-14: qty 2

## 2014-07-14 MED ORDER — HEMOSTATIC AGENTS (NO CHARGE) OPTIME
TOPICAL | Status: DC | PRN
  Administered 2014-07-14: 1 via TOPICAL

## 2014-07-14 MED ORDER — LIDOCAINE-EPINEPHRINE 2 %-1:100000 IJ SOLN
INTRAMUSCULAR | Status: AC
  Filled 2014-07-14: qty 1

## 2014-07-14 MED ORDER — FENTANYL CITRATE 0.05 MG/ML IJ SOLN
INTRAMUSCULAR | Status: DC | PRN
  Administered 2014-07-14 (×2): 100 ug via INTRAVENOUS
  Administered 2014-07-14: 50 ug via INTRAVENOUS

## 2014-07-14 MED ORDER — INSULIN GLARGINE 100 UNIT/ML ~~LOC~~ SOLN
35.0000 [IU] | Freq: Every day | SUBCUTANEOUS | Status: DC
  Administered 2014-07-14: 35 [IU] via SUBCUTANEOUS
  Filled 2014-07-14 (×2): qty 0.35

## 2014-07-14 MED ORDER — 0.9 % SODIUM CHLORIDE (POUR BTL) OPTIME
TOPICAL | Status: DC | PRN
  Administered 2014-07-14: 1000 mL

## 2014-07-14 MED ORDER — PROPOFOL 10 MG/ML IV BOLUS
INTRAVENOUS | Status: DC | PRN
  Administered 2014-07-14: 50 mg via INTRAVENOUS

## 2014-07-14 MED ORDER — BUDESONIDE 0.5 MG/2ML IN SUSP
0.5000 mg | Freq: Two times a day (BID) | RESPIRATORY_TRACT | Status: DC
  Administered 2014-07-14 – 2014-07-21 (×14): 0.5 mg via RESPIRATORY_TRACT
  Filled 2014-07-14 (×17): qty 2

## 2014-07-14 MED ORDER — LIDOCAINE-EPINEPHRINE 1 %-1:100000 IJ SOLN
INTRAMUSCULAR | Status: DC | PRN
  Administered 2014-07-14: 4 mL

## 2014-07-14 MED ORDER — LACTATED RINGERS IV SOLN
INTRAVENOUS | Status: DC | PRN
  Administered 2014-07-14: 18:00:00 via INTRAVENOUS

## 2014-07-14 MED ORDER — MIDAZOLAM HCL 5 MG/5ML IJ SOLN
INTRAMUSCULAR | Status: DC | PRN
  Administered 2014-07-14: 2 mg via INTRAVENOUS

## 2014-07-14 SURGICAL SUPPLY — 50 items
BLADE 10 SAFETY STRL DISP (BLADE) ×3 IMPLANT
BLADE 11 SAFETY STRL DISP (BLADE) IMPLANT
BLADE 15 SAFETY STRL DISP (BLADE) IMPLANT
BLADE SURG ROTATE 9660 (MISCELLANEOUS) IMPLANT
CANISTER SUCTION 2500CC (MISCELLANEOUS) ×3 IMPLANT
CLEANER TIP ELECTROSURG 2X2 (MISCELLANEOUS) ×3 IMPLANT
COVER SURGICAL LIGHT HANDLE (MISCELLANEOUS) ×3 IMPLANT
DECANTER SPIKE VIAL GLASS SM (MISCELLANEOUS) ×3 IMPLANT
ELECT COATED BLADE 2.86 ST (ELECTRODE) ×3 IMPLANT
ELECT REM PT RETURN 9FT ADLT (ELECTROSURGICAL) ×3
ELECTRODE REM PT RTRN 9FT ADLT (ELECTROSURGICAL) ×1 IMPLANT
GAUZE SPONGE 4X4 16PLY XRAY LF (GAUZE/BANDAGES/DRESSINGS) ×2 IMPLANT
GAUZE XEROFORM 5X9 LF (GAUZE/BANDAGES/DRESSINGS) IMPLANT
GLOVE BIO SURGEON STRL SZ 6.5 (GLOVE) ×2 IMPLANT
GLOVE BIO SURGEONS STRL SZ 6.5 (GLOVE) ×1
GLOVE BIOGEL PI IND STRL 6.5 (GLOVE) IMPLANT
GLOVE BIOGEL PI IND STRL 7.0 (GLOVE) IMPLANT
GLOVE BIOGEL PI INDICATOR 6.5 (GLOVE) ×2
GLOVE BIOGEL PI INDICATOR 7.0 (GLOVE) ×4
GLOVE ECLIPSE 7.5 STRL STRAW (GLOVE) ×3 IMPLANT
GLOVE SURG SS PI 6.5 STRL IVOR (GLOVE) ×2 IMPLANT
GOWN STRL REUS W/ TWL LRG LVL3 (GOWN DISPOSABLE) ×3 IMPLANT
GOWN STRL REUS W/TWL LRG LVL3 (GOWN DISPOSABLE) ×12
HEMOSTAT SURGICEL 2X14 (HEMOSTASIS) ×2 IMPLANT
HOLDER TRACH TUBE VELCRO 19.5 (MISCELLANEOUS) IMPLANT
KIT BASIN OR (CUSTOM PROCEDURE TRAY) ×3 IMPLANT
KIT ROOM TURNOVER OR (KITS) ×3 IMPLANT
KIT SUCTION CATH 14FR (SUCTIONS) IMPLANT
NDL HYPO 25GX1X1/2 BEV (NEEDLE) ×1 IMPLANT
NEEDLE HYPO 25GX1X1/2 BEV (NEEDLE) ×3 IMPLANT
NS IRRIG 1000ML POUR BTL (IV SOLUTION) ×3 IMPLANT
PAD ARMBOARD 7.5X6 YLW CONV (MISCELLANEOUS) ×6 IMPLANT
PENCIL BUTTON HOLSTER BLD 10FT (ELECTRODE) ×2 IMPLANT
SPONGE DRAIN TRACH 4X4 STRL 2S (GAUZE/BANDAGES/DRESSINGS) ×2 IMPLANT
SPONGE INTESTINAL PEANUT (DISPOSABLE) ×3 IMPLANT
SUT CHROMIC 3 0 SH 27 (SUTURE) IMPLANT
SUT PROLENE 2 0 SH DA (SUTURE) ×3 IMPLANT
SUT SILK 3 0 (SUTURE) ×3
SUT SILK 3-0 18XBRD TIE 12 (SUTURE) ×1 IMPLANT
SUT VIC AB 3-0 PS2 18 (SUTURE) ×3
SUT VIC AB 3-0 PS2 18XBRD (SUTURE) IMPLANT
SYR 20CC LL (SYRINGE) ×2 IMPLANT
SYR BULB 3OZ (MISCELLANEOUS) ×2 IMPLANT
SYR CONTROL 10ML LL (SYRINGE) ×2 IMPLANT
TOWEL OR 17X24 6PK STRL BLUE (TOWEL DISPOSABLE) ×3 IMPLANT
TOWEL OR 17X26 10 PK STRL BLUE (TOWEL DISPOSABLE) ×3 IMPLANT
TRAY ENT MC OR (CUSTOM PROCEDURE TRAY) ×3 IMPLANT
TUBE CONNECTING 12'X1/4 (SUCTIONS) ×1
TUBE CONNECTING 12X1/4 (SUCTIONS) ×2 IMPLANT
TUBE TRACH 6.0 EXL PROX CUF (TUBING) ×2 IMPLANT

## 2014-07-14 NOTE — Anesthesia Postprocedure Evaluation (Signed)
  Anesthesia Post-op Note  Patient: Blake SalenJoseph L Summers  Procedure(s) Performed: Procedure(s): TRACHEOSTOMY (N/A)  Patient Location: SICU  Anesthesia Type:General  Level of Consciousness: sedated, patient cooperative and responds to stimulation  Airway and Oxygen Therapy: Patient Spontanous Breathing and Patient placed on Ventilator (see vital sign flow sheet for setting) via trach  Post-op Pain: none  Post-op Assessment: Post-op Vital signs reviewed, Patient's Cardiovascular Status Stable, Respiratory Function Stable, Patent Airway, No signs of Nausea or vomiting and Pain level controlled  Post-op Vital Signs: Reviewed and stable  Last Vitals:  Filed Vitals:   07/14/14 2007  BP: 141/71  Pulse: 89  Temp:   Resp: 18    Complications: No apparent anesthesia complications

## 2014-07-14 NOTE — Progress Notes (Signed)
PULMONARY / CRITICAL CARE MEDICINE   Name: Blake Summers MRN: [id number] DOB: 20-Dec-1945    ADMISSION DATE:  07/10/2014 CONSULTATION DATE:  07/10/14  REFERRING MD :  Dr. Gonzella Lexhungel  CHIEF COMPLAINT:  Shortness of breath  INITIAL PRESENTATION: [name] is a 69yo man w/ PMHx of obstructive sleep apnea, obesity hypoventilation syndrome, GERD, Type 2 DM, and chronic hypercarbic respiratory failure on BiPAP at home who presented with worsening SOB for the past week. Found to be in acute on chronic hypercarbic respiratory failure.   MAJOR EVENTS/TEST RESULTS: 1/29 CT Angio Chest: No PE. Cardiomegaly, sm R pleural effusion and interstitial thickening likely CHF.  1/29 failed NPPV. Intubated 1/30 Persistent AF. UFH gtt initiated. Screened for MIND study 1/31: copious thick secretions but passed SBT. Extubated and failed several hours later. Re-intubated after failed NPPV attempt. UA looked very raw and edematous on re-intubation. Abx started for presumed PNA  INDWELLING DEVICES:: ETT 1/29 >>   MICRO DATA: Resp 1/30  >> moraxella Resp 1/31>>oral flora Blood 1/29 >> ng MRSA 1/29>>neg  Flu 1/29   ANTIMICROBIALS:  None   SUBJECTIVE:  Awake and interactive, c/o throat pain RASS+1   VITAL SIGNS: Temp:  [98.1 F (36.7 C)-98.7 F (37.1 C)] 98.7 F (37.1 C) (02/02 0700) Pulse Rate:  [78-103] 92 (02/02 0912) Resp:  [18-40] 21 (02/02 0912) BP: (119-147)/(62-96) 133/67 mmHg (02/02 0912) SpO2:  [94 %-99 %] 99 % (02/02 0912) FiO2 (%):  [40 %-50 %] 40 % (02/02 0912) Weight:  [141.9 kg (312 lb 13.3 oz)] 141.9 kg (312 lb 13.3 oz) (02/02 0600) HEMODYNAMICS:   Vent Mode:  [-] CPAP;PSV FiO2 (%):  [40 %-50 %] 40 % Set Rate:  [18 bmp] 18 bmp Vt Set:  [500 mL] 500 mL PEEP:  [5 cmH20] 5 cmH20 Pressure Support:  [8 cmH20] 8 cmH20 Plateau Pressure:  [19 cmH20-22 cmH20] 19 cmH20 INTAKE / OUTPUT:  Intake/Output Summary (Last 24 hours) at 07/14/14 1010 Last data filed at 07/14/14 0600  Gross  per 24 hour  Intake 2296.7 ml  Output   1335 ml  Net  961.7 ml    PHYSICAL EXAMINATION: General: obese, plethoric, NAD Neuro: no focal deficits, moving all 4 ext, following commands, eyes open HEENT: [place]/AT Cardiovascular: irreg rate controlled<110, no murmurs  Lungs: course breath sounds b/l, decreased BL Abdomen: obese, edematous Ext: 1-2+ pitting edema in lower extremities Skin: chronic venous skin changes in lower extremities, erythema   LABS: CMP Latest Ref Rng 07/14/2014 07/13/2014 07/12/2014  Glucose 70 - 99 mg/dL 409(W250(H) 119(J239(H) 478(G285(H)  BUN 6 - 23 mg/dL 18 16 17   Creatinine 0.50 - 1.35 mg/dL 9.560.64 2.130.72 0.860.78  Sodium 135 - 145 mmol/L 142 141 138  Potassium 3.5 - 5.1 mmol/L 3.8 3.8 3.8  Chloride 96 - 112 mmol/L 102 99 95(L)  CO2 19 - 32 mmol/L 35(H) 36(H) 34(H)  Calcium 8.4 - 10.5 mg/dL 8.7 8.5 8.5  Total Protein 6.0 - 8.3 g/dL - - 6.1  Total Bilirubin 0.3 - 1.2 mg/dL - - 0.5  Alkaline Phos 39 - 117 U/L - - 83  AST 0 - 37 U/L - - 16  ALT 0 - 53 U/L - - 14   CBC Latest Ref Rng 07/14/2014 07/13/2014 07/12/2014  WBC 4.0 - 10.5 K/uL 9.0 9.6 11.1(H)  Hemoglobin 13.0 - 17.0 g/dL 12.5(L) 12.4(L) 12.8(L)  Hematocrit 39.0 - 52.0 % 42.5 41.3 42.7  Platelets 150 - 400 K/uL 225 233 244    CXR:  cardiomegaly  ASSESSMENT / PLAN:  PULMONARY A:  ETT 1/29>> 1/31, 1/31 >>  Acute on chronic hypercarbic respiratory failure OSA/OHS Mucous plugging resolving per CXR 1/31  Copious thick tan blood tinged secretions from ETT ?tracheal bronchitis  Mild pulm edema  Mod L pleural effusion (decreased) L chest white out - likely atx -resolved Remote smoking P:   Trach planned  - Dr Suszanne Conners  Prn Alb, Pulmicort 0.25 q6, Duoneb q6    CARDIOVASCULAR A:  H/O HTN, HLD AF rate controlled, CVR - unclear duration Mild cardiomegaly  P:  Heparin gtt . Aspirin 81 per tube. Cont hydralazine prn q4 SBP >170 Cont metoprolol prn 2.5-5 mg q3 prn HR >115 Echocardiogram  > poor study, no good  windows  RENAL A:   Hypokalemia 3.8  Phos 1.8  P:   Monitor BMET (replete electrolytes prn)   Goal K >4, Mag >2  Monitor I/Os, daily wts    GASTROINTESTINAL A:   GERD, chronic PPI use NPO with TF  P:   Protonix 40 mg iv qd   HEMATOLOGIC A:   Normocytic anemia Hbg stable  P:  DVT px: full dose heparin for AF Monitor CBC intermittently Transfuse per usual ICU guidelines   INFECTIOUS A:  Leukocytosis ?tracheal bronchitis -moraxella Abx Rocephin 1/29>>1/29 1 dose, AZM 1/29>>1/29 1 dose Unasyn 1/31>> P:   Micro and abx as above Ct Unasyn   ENDOCRINE A:   Type 2 DM with hyperglycemia (HA1C 8.4 05/27/14)  Low tsh ? Sick euthyroid; free T4 normal P:   Increase Lantus 35 Cont SSI-R   NEUROLOGIC A:   Acute encephalopathy, fully resolved Awake following commands   P:   RASS goal:0    FAMILY  - Updates: Updated pt, son, wife on 2/1  Care during the described time interval was provided by me and/or other providers on the critical care team.  I have reviewed this patient's available data, including medical history, events of note, physical examination and test results as part of my evaluation  CC time x 23m Blake Summers V. MD  07/14/2014, 10:10 AM

## 2014-07-14 NOTE — Progress Notes (Signed)
ANTICOAGULATION CONSULT NOTE - Follow-up Consult  Pharmacy Consult for Heparin  Indication: atrial fibrillation  Allergies  Allergen Reactions  . Neosporin [Neomycin-Polymyxin-Gramicidin] Other (See Comments)    unknown    Patient Measurements: Height: 5\' 7"  (170.2 cm) Weight: (!) 312 lb 13.3 oz (141.9 kg) IBW/kg (Calculated) : 66.1 Heparin Dosing Weight: 104 kg (calculated based on admit weight)  Vital Signs: Temp: 98.7 F (37.1 C) (02/02 0700) Temp Source: Axillary (02/02 0700) BP: 133/67 mmHg (02/02 0912) Pulse Rate: 92 (02/02 0912)  Labs:  Recent Labs  07/12/14 0228  07/13/14 0350 07/13/14 0945 07/13/14 1800 07/14/14 0500  HGB 12.8*  --  12.4*  --   --  12.5*  HCT 42.7  --  41.3  --   --  42.5  PLT 244  --  233  --   --  225  HEPARINUNFRC 0.31  < >  --  0.29* 0.50 0.30  CREATININE 0.78  --  0.72  --   --  0.64  < > = values in this interval not displayed.  Estimated Creatinine Clearance: 118.8 mL/min (by C-G formula based on Cr of 0.64).  Assessment: 68 YOM on IV heparin for new Afib of unclear duration.   Currently heparin level 0.3 is low-end therapeutic at 2300 units/hr.  CBC is stable. No bleeding reported.   Goal of Therapy:  Heparin level 0.3-0.7 units/ml Monitor platelets by anticoagulation protocol: Yes   Plan:  Increase heparin rate slightly to 2400 units/hr.   Daily heparin level and CBC while on therapy.   Bayard HuggerMei Treyten Monestime, PharmD, BCPS  Clinical Pharmacist  Pager: [phone]   07/14/2014 10:02 AM

## 2014-07-14 NOTE — Consult Note (Signed)
Reason for Consult: Respiratory failure, vent dependent  HPI:  Blake Summers is an 69 y.o. male w/ PMHx of obstructive sleep apnea, obesity hypoventilation syndrome, GERD, Type 2 DM, and chronic hypercarbic respiratory failure on BiPAP at home who presented to [facility] last week with worsening SOB. He was found to be in acute on chronic hypercarbic respiratory failure. He was intubated on 07/10/14 and placed on vent support. He subsequently failed his extubation attempt on 07/12/14. ENT consulted for trach placement to help wean his ventilator support and to treat his OSA.  Past Medical History  Diagnosis Date  . GERD 07/06/2010  . Edema 10/28/2007  . DIABETES MELLITUS, TYPE II 05/04/2007  . HYPERLIPIDEMIA 07/08/2008  . ALLERGIC RHINITIS 07/08/2008  . GLAUCOMA 07/08/2008  . CEREBROVASCULAR ACCIDENT, HX OF 07/08/2008  . OBSTRUCTIVE SLEEP APNEA 03/06/2008  . DIABETIC ULCER, LEFT LEG 08/23/2009  . 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 HYPERTENSION 10/28/2007  . VENOUS INSUFFICIENCY 03/08/2009  . FATTY LIVER DISEASE 05/19/2008  . BENIGN PROSTATIC HYPERTROPHY 07/08/2008  . 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 NEPHROLITHIASIS, HX OF 07/08/2008  . Morbid obesity   . DM nephropathy/sclerosis   . 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 Lumbar spondylosis   . Diarrhea 06/08/2013    Past Surgical History  Procedure Laterality Date  . Lithotripsy  2007    Family History  Problem Relation Age of Onset  . Cancer Brother     Colon Cancer  . Heart disease Brother   . Heart disease Brother   . Asthma Mother     Social History:  reports that he quit smoking about 26 years ago. His smoking use included Cigarettes. He has a 7.5 pack-year smoking history. He has never used smokeless tobacco. He reports that he does not drink alcohol or use illicit drugs.  Allergies:  Allergies  Allergen Reactions  . Neosporin [Neomycin-Polymyxin-Gramicidin] Other (See Comments)    unknown     Prior to Admission medications   Medication Sig Start Date End Date Taking? Authorizing Provider  allopurinol (ZYLOPRIM) 300 MG tablet TAKE 1 TABLET BY MOUTH EVERY DAY 06/01/14  Yes Renato Shin, MD  brimonidine (ALPHAGAN) 0.15 % ophthalmic solution Place 1 drop into both eyes 2 (two) times daily.    Yes Historical Provider, MD  CLARITIN-D 12 HOUR 5-120 MG per tablet TAKE 1 TABLET BY MOUTH 2 TIMES A DAY 06/29/14  Yes Renato Shin, MD  clobetasol ointment (TEMOVATE) 0.05 % Apply topically 3 (three) times daily. 10/29/12  Yes Renato Shin, MD  clotrimazole (MYCELEX) 10 MG troche Take 1 tablet (10 mg total) by mouth 5 (five) times daily. 02/25/14  Yes Renato Shin, MD  dexlansoprazole (DEXILANT) 60 MG capsule Take 1 capsule (60 mg total) by mouth daily. 07/07/14  Yes Kathee Delton, MD  diclofenac sodium (VOLTAREN) 1 % GEL APPLY TOPICALLY 4 TIMES DAILY AS PHYSICIAN INSTRUCTED 04/01/14  Yes Meredith Staggers, MD  diltiazem (CARDIZEM CD) 180 MG 24 hr capsule TAKE 2 CAPSULES BY MOUTH DAILY 06/01/14  Yes Renato Shin, MD  dipyridamole-aspirin (AGGRENOX) 200-25 MG per 12 hr capsule TAKE 1 CAPSULE BY MOUTH 2 TIMES DAILY 06/01/14  Yes Renato Shin, MD  dorzolamide-timolol (COSOPT) 22.3-6.8 MG/ML ophthalmic solution Place 1 drop into both eyes 2 (two) times daily.  03/13/11  Yes Historical Provider, MD  furosemide (LASIX) 80 MG tablet TAKE 1 TABLET BY MOUTH EVERY DAY 06/29/14  Yes Renato Shin, MD  Insulin NPH, Human,, Isophane, (HUMULIN  N KWIKPEN) 100 UNIT/ML Kiwkpen Inject 60 Units into the skin at bedtime. 05/27/14  Yes Renato Shin, MD  ketoconazole (NIZORAL) 2 % shampoo Apply 1 application topically once a week.  03/13/11  Yes Historical Provider, MD  latanoprost (XALATAN) 0.005 % ophthalmic solution Place 1 drop into both eyes at bedtime. 10/29/12  Yes Renato Shin, MD  lidocaine (LIDODERM) 5 % Place 1-3 patches onto the skin daily. Remove & Discard patch within 12 hours or as directed by MD 05/15/13  Yes  Ivan Anchors Love, PA-C  losartan-hydrochlorothiazide (HYZAAR) 100-25 MG per tablet TAKE 1 TABLET BY MOUTH EVERY DAY   Yes Renato Shin, MD  lovastatin (MEVACOR) 40 MG tablet TAKE 1 TABLET BY MOUTH AT BEDTIME FOR CHOLESTEROL 09/26/13  Yes Renato Shin, MD  methocarbamol (ROBAXIN) 500 MG tablet Take 500 mg by mouth at bedtime. For cramping   Yes Historical Provider, MD  morphine (MS CONTIN) 15 MG 12 hr tablet Take 1 tablet (15 mg total) by mouth 2 (two) times daily. 06/19/14  Yes Meredith Staggers, MD  naproxen sodium (ANAPROX) 220 MG tablet Take 220 mg by mouth daily.    Yes Historical Provider, MD  NOVOLOG FLEXPEN 100 UNIT/ML FlexPen INJECT 3 TIMES PER DAY JUST BEFORE EACH MEAL (90-120-160 UNITS) Patient taking differently: INJECT 90 UNITS WITH BREAKFAST, 120 UNITS AT LUNCH AND 160 UNITS WITH SUPPER (3 TIMES DAILY WITH MEALS) 04/30/14  Yes Renato Shin, MD  oxyCODONE-acetaminophen (PERCOCET) 7.5-325 MG per tablet Take 1 tablet by mouth every 4 (four) hours as needed for pain. Patient taking differently: Take 1 tablet by mouth 2 (two) times daily.  06/19/14  Yes Meredith Staggers, MD  polyethylene glycol powder (GLYCOLAX/MIRALAX) powder DISSOLVE 1 CAPFUL (17GM) IN 8-10 OUNCES OF WATER & DRINK EVERY DAY 06/29/14  Yes Renato Shin, MD  potassium chloride SA (K-DUR,KLOR-CON) 20 MEQ tablet Take 1 tablet (20 mEq total) by mouth daily. 05/27/14  Yes Renato Shin, MD  PROVENTIL HFA 108 (90 BASE) MCG/ACT inhaler ONE PUFF EVERY 4 HOURS AS NEEDED Patient taking differently: ONE PUFF EVERY 4 HOURS AS NEEDED FOR SHORTNESS OF BREATH 11/06/11  Yes Renato Shin, MD  ranitidine (ZANTAC) 150 MG tablet TAKE 2 TABLETS BY MOUTH EVERY NIGHT AT BEDTIME 06/29/14  Yes Renato Shin, MD  topiramate (TOPAMAX) 50 MG tablet Take 1 tablet (50 mg total) by mouth 2 (two) times daily. 07/02/14  Yes Meredith Staggers, MD  zolpidem (AMBIEN) 10 MG tablet Take 1 tablet (10 mg total) by mouth at bedtime as needed. 03/20/13  Yes Renato Shin, MD  BAYER  CONTOUR NEXT TEST test strip CHECK BLOOD SUGAR TWICE DAILY 06/01/14   Renato Shin, MD  Blood Glucose Monitoring Suppl (BAYER CONTOUR NEXT MONITOR) W/DEVICE KIT 1 Device by Does not apply route once. 05/13/13   Renato Shin, MD  cyanocobalamin (,VITAMIN B-12,) 1000 MCG/ML injection Inject 1,000 mcg into the muscle every 30 (thirty) days.    Historical Provider, MD  diltiazem (CARDIZEM CD) 180 MG 24 hr capsule TAKE 2 CAPSULES BY MOUTH DAILY 04/30/14   Renato Shin, MD  docusate sodium (COLACE) 100 MG capsule TAKE 1 CAPSULE BY MOUTH 2 TIMES DAILY 06/01/14   Renato Shin, MD  lactulose (CHRONULAC) 10 GM/15ML solution Take 45 mLs (30 g total) by mouth 2 (two) times daily. 11/11/13   Renato Shin, MD  methocarbamol (ROBAXIN) 500 MG tablet TAKE 1 TABLET BY MOUTH 3 TIMES DAILY AS NEEDED FOR CRAMPING 04/30/14   Renato Shin, MD  NOVOFINE 30G  X 8 MM MISC USE 4 TIMES A DAY 06/02/13   Renato Shin, MD  phenol (CHLORASEPTIC) 1.4 % LIQD Use as directed 1 spray in the mouth or throat as needed. 02/08/13   Ria Bush, MD    Medications:  I have reviewed the patient's current medications. Scheduled: . ampicillin-sulbactam (UNASYN) IV  3 g Intravenous Q8H  . antiseptic oral rinse  7 mL Mouth Rinse QID  . aspirin  81 mg Oral Daily  . brimonidine  1 drop Both Eyes BID  . budesonide  0.25 mg Nebulization 4 times per day  . chlorhexidine  15 mL Mouth Rinse BID  . dorzolamide-timolol  1 drop Both Eyes BID  . feeding supplement (PRO-STAT SUGAR FREE 64)  60 mL Per Tube TID  . feeding supplement (VITAL HIGH PROTEIN)  1,000 mL Per Tube Q24H  . insulin aspart  0-20 Units Subcutaneous 6 times per day  . insulin glargine  30 Units Subcutaneous QHS  . ipratropium-albuterol  3 mL Nebulization Q6H  . latanoprost  1 drop Both Eyes QHS  . pantoprazole (PROTONIX) IV  40 mg Intravenous Q24H   OAC:ZYSAYT chloride, albuterol, fentaNYL, hydrALAZINE, metoprolol   Dg Abd 1 View  07/12/2014   CLINICAL DATA:  OG tube  placement.  EXAM: ABDOMEN - 1 VIEW  COMPARISON:  Plain film of the abdomen 07/10/2014.  FINDINGS: OG tube tip is identified with the tip just proximal to the stomach. Recommend advancement of 6-7 cm.  IMPRESSION: Rectum stress OG tube in place.  Recommend advancement of 6-7 cm.   Electronically Signed   By: Inge Rise M.D.   On: 07/12/2014 15:05   Dg Chest Port 1 View  07/13/2014   CLINICAL DATA:  Respiratory failure.  EXAM: PORTABLE CHEST - 1 VIEW  COMPARISON:  07/12/2014 .  FINDINGS: Endotracheal tube, NG tube, left subclavian line in stable position. Left subclavian line is slightly kinked at its insertion site. This is unchanged. Severe cardiomegaly is again noted. Mild basilar atelectasis and/or infiltrates again noted. Small pleural effusions cannot be excluded. No pneumothorax.  IMPRESSION: 1. Lines and tubes in stable position. The left subclavian line may be slightly kinked at its insertion site. This is unchanged in appearance. 2. Severe cardiomegaly again noted. No prominent pulmonary vascularity. 3. Bibasilar atelectasis and/or infiltrates. Small pleural effusions cannot be excluded.   Electronically Signed   By: Marcello Moores  Register   On: 07/13/2014 07:16   Dg Chest Portable 1 View  07/12/2014   CLINICAL DATA:  Line placement.  EXAM: PORTABLE CHEST - 1 VIEW  COMPARISON:  Single view of the chest 07/12/2014 at 4:58 a.m.  FINDINGS: Endotracheal tube and NG tube remain in place some projecting good position. A new left subclavian central venous catheter is seen with the tip projecting in the mid superior vena cava. The new subclavian line may be kinked at the level of the midshaft of the left clavicle. No pneumothorax is identified. Tube projecting over the right chest is likely the patient's NG tube. There is cardiomegaly and diffuse bilateral airspace disease. Likely left effusion is noted.  IMPRESSION: Tip of left subclavian catheter projects over the mid superior vena cava. No pneumothorax. Note  is made that the catheter appears kinked at the level of the mid clavicle. Support apparatus is otherwise unchanged.  No change in cardiomegaly and extensive bilateral airspace disease.   Electronically Signed   By: Inge Rise M.D.   On: 07/12/2014 15:45   REVIEW OF SYSTEMS (per  medical record):  General: Denies fever, chills, night sweats, changes in weight, changes in appetite HEENT: Denies headaches, ear pain, changes in vision, rhinorrhea, sore throat CV: Denies CP, palpitations, SOB, orthopnea GI: Denies abdominal pain, nausea, vomiting, diarrhea, constipation, melena, hematochezia GU: Denies dysuria, hematuria, frequency Msk: Denies muscle cramps, joint pains Neuro: Denies weakness, numbness, tingling Skin: Denies rashes, bruising  Blood pressure 133/67, pulse 92, temperature 98.7 F (37.1 C), temperature source Axillary, resp. rate 21, height $RemoveBe'5\' 7"'uoHHFvPXY$  (1.702 m), weight 312 lb 13.3 oz (141.9 kg), SpO2 99 %. PHYSICAL EXAMINATION: General: obese, intubated. Awake Neuro: no focal deficits, following commands, eyes open HEENT: [place]/AT [place]/OC:  ET tube in place. No lesion. Neck: Obese. Laryngeal framework not easily palpable. No lesion. Lungs: course breath sounds b/l Ext: 1-2+ pitting edema in lower extremities Skin: chronic venous skin changes in lower extremities, erythema   Assessment/Plan: Pt with h/o obesity, OSA, chronic respiratory failure. Now with acute decompensation and vent dependent.  Plane tracheostomy tube placement in OR, likely tomorrow AM.   Ascencion Dike 07/14/2014, 9:59 AM

## 2014-07-14 NOTE — Progress Notes (Signed)
Inpatient Diabetes Program Recommendations  AACE/ADA: New Consensus Statement on Inpatient Glycemic Control (2013)  Target Ranges:  Prepandial:   less than 140 mg/dL      Peak postprandial:   less than 180 mg/dL (1-2 hours)      Critically ill patients:  140 - 180 mg/dL   Reason for Assessment:  Results for Kerin SalenROXEL, Brode L (MRN [id number]) as of 07/14/2014 09:51  Ref. Range 07/13/2014 19:20 07/14/2014 00:41 07/14/2014 03:59 07/14/2014 05:00 07/14/2014 07:37  Glucose-Capillary Latest Range: 70-99 mg/dL 409200 (H) 811241 (H) 914230 (H)  228 (H)    CBG's remain greater than goal in the ICU setting.  Currently patient is on Novolog correction q 4 hours and Lantus 30 units.  Note that patient is on tube feeds.    May consider adding Tube feed coverage 4 units q 4 hours (Hold if tube feeds are held) in addition to correction.  Thanks, Beryl MeagerJenny Zackory Pudlo, RN, BC-ADM Inpatient Diabetes Coordinator Pager [phone]

## 2014-07-14 NOTE — Transfer of Care (Signed)
Immediate Anesthesia Transfer of Care Note  Patient: Blake Summers  Procedure(s) Performed: Procedure(s): TRACHEOSTOMY (N/A)  Patient Location: SICU  Anesthesia Type:General  Level of Consciousness: sedated and Patient remains intubated per anesthesia plan  Airway & Oxygen Therapy: Patient remains intubated per anesthesia plan and Patient placed on Ventilator (see vital sign flow sheet for setting)  Post-op Assessment: Report given to RN and Post -op Vital signs reviewed and stable  Post vital signs: Reviewed and stable  Last Vitals:  Filed Vitals:   07/14/14 1725  BP:   Pulse: 94  Temp:   Resp: 19    Complications: No apparent anesthesia complications

## 2014-07-14 NOTE — OR Nursing (Signed)
Dr. Suszanne Connerseoh declared this procedure an emergency when he found out patient had only been NPO for 3 hours.

## 2014-07-14 NOTE — Op Note (Signed)
DATE OF PROCEDURE:  07/14/2014                              OPERATIVE REPORT  SURGEON:  Newman PiesSu Analisa Sledd, MD  PREOPERATIVE DIAGNOSES: 1. Acute respiratory failure 2. Ventilator dependence 3. Obstructive sleep apnea  POSTOPERATIVE DIAGNOSES: 1. Acute respiratory failure 2. Ventilator dependence 3. Obstructive sleep apnea  PROCEDURE PERFORMED: Tracheostomy   ANESTHESIA: General endotracheal tube anesthesia  COMPLICATIONS: None.  ESTIMATED BLOOD LOSS: Minimal.  INDICATION FOR PROCEDURE: Blake HartJohn Baker Mecum Montez HagemanJr. is a 69 y.o. male who was recently admitted for acute respiratory failure. The patient has a history of obesity and OSA. He was intubated and vent dependent. He failed his extubation attempt. ENT was therefore consulted for tracheostomy tube placement.  DESCRIPTION: The patient was taken to the operating room and placed supine on the operating table. General endotracheal tube anesthesia was administered by the anesthesiologist. The patient was positioned and prepped and draped in a standard fashion for tracheostomy tube placement. 1% lidocaine with 1-100,000 epinephrine was infiltrated at the planned site of incision. A vertical incision was made at midline. The incision was carried down to the level of the strap muscles. The strap muscles were divided at midline and retracted laterally, exposing the thyroid gland. The thyroid isthmus was then divided at midline. The anterior tracheal wall was identified. A tracheal window was made at the second tracheal ring. The endotracheal tube was withdrawn. A #6 XLT Shiley tracheostomy tube was placed without difficulty. Good CO2 return was noted. The tracheostomy tube was sutured in place. The care of the patient was turned over to the anesthesiologist. The patient was awakened from anesthesia without difficulty. He was transferred back to the intensive care unit.  OPERATIVE FINDINGS: A #6 XLT Shiley tracheostomy tube was placed.  SPECIMEN:  None.  FOLLOWUP CARE: The patient will return to the ICU. Due to his difficult airway, we will leave the fresh tracheostomy tube in place for approximately 2 weeks.   Christien Berthelot WOOI 07/14/2014

## 2014-07-14 NOTE — Anesthesia Preprocedure Evaluation (Addendum)
Anesthesia Evaluation  Patient identified by MRN, date of birth, ID band Patient awake  General Assessment Comment:Case discussed with Dr. Suszanne Connerseoh and case deemed emergent. CE  Airway       Comment: Intubated from ICU Dental   Pulmonary shortness of breath, sleep apnea , pneumonia -, former smoker,  + rhonchi   - decreased breath sounds  + intubated    Cardiovascular hypertension, + Peripheral Vascular Disease Rhythm:Regular Rate:Abnormal     Neuro/Psych    GI/Hepatic Neg liver ROS, GERD-  ,  Endo/Other  diabetes  Renal/GU Renal disease     Musculoskeletal   Abdominal (+) + obese,   Peds  Hematology   Anesthesia Other Findings   Reproductive/Obstetrics                           Anesthesia Physical Anesthesia Plan  ASA: III and emergent  Anesthesia Plan: General   Post-op Pain Management:    Induction: Intravenous  Airway Management Planned: Oral ETT and Tracheostomy  Additional Equipment:   Intra-op Plan:   Post-operative Plan:   Informed Consent: I have reviewed the patients History and Physical, chart, labs and discussed the procedure including the risks, benefits and alternatives for the proposed anesthesia with the patient or authorized representative who has indicated his/her understanding and acceptance.   Dental advisory given  Plan Discussed with: Anesthesiologist, CRNA and Surgeon  Anesthesia Plan Comments: (Tube feeds off at 1400 today. Dr Suszanne Connerseoh to declare trach an emergent case.  Will proceed to OR)       Anesthesia Quick Evaluation

## 2014-07-15 ENCOUNTER — Inpatient Hospital Stay (HOSPITAL_COMMUNITY): Payer: Medicare Other

## 2014-07-15 ENCOUNTER — Ambulatory Visit: Payer: Medicare Other | Admitting: Pulmonary Disease

## 2014-07-15 LAB — GLUCOSE, CAPILLARY
GLUCOSE-CAPILLARY: 176 mg/dL — AB (ref 70–99)
GLUCOSE-CAPILLARY: 204 mg/dL — AB (ref 70–99)
GLUCOSE-CAPILLARY: 176 mg/dL — AB (ref 70–99)
Glucose-Capillary: 178 mg/dL — ABNORMAL HIGH (ref 70–99)
Glucose-Capillary: 183 mg/dL — ABNORMAL HIGH (ref 70–99)
Glucose-Capillary: 224 mg/dL — ABNORMAL HIGH (ref 70–99)

## 2014-07-15 LAB — CBC
HCT: 41.7 % (ref 39.0–52.0)
Hemoglobin: 12.6 g/dL — ABNORMAL LOW (ref 13.0–17.0)
MCH: 27.4 pg (ref 26.0–34.0)
MCHC: 30.2 g/dL (ref 30.0–36.0)
MCV: 90.7 fL (ref 78.0–100.0)
PLATELETS: 236 10*3/uL (ref 150–400)
RBC: 4.6 MIL/uL (ref 4.22–5.81)
RDW: 15.5 % (ref 11.5–15.5)
WBC: 9.9 10*3/uL (ref 4.0–10.5)

## 2014-07-15 LAB — BASIC METABOLIC PANEL
ANION GAP: 7 (ref 5–15)
BUN: 15 mg/dL (ref 6–23)
CO2: 34 mmol/L — AB (ref 19–32)
CREATININE: 0.69 mg/dL (ref 0.50–1.35)
Calcium: 8.8 mg/dL (ref 8.4–10.5)
Chloride: 103 mmol/L (ref 96–112)
GFR calc Af Amer: >90 mL/min (ref >90)
GFR calc non Af Amer: >90 mL/min (ref >90)
GLUCOSE: 200 mg/dL — AB (ref 70–99)
POTASSIUM: 3.8 mmol/L (ref 3.5–5.1)
Sodium: 144 mmol/L (ref 135–145)

## 2014-07-15 LAB — HEPARIN LEVEL (UNFRACTIONATED): HEPARIN UNFRACTIONATED: 0.3 [IU]/mL (ref 0.30–0.70)

## 2014-07-15 LAB — CLOSTRIDIUM DIFFICILE BY PCR: CDIFFPCR: NEGATIVE

## 2014-07-15 MED ORDER — SODIUM CHLORIDE 0.9 % IV SOLN
INTRAVENOUS | Status: DC
  Administered 2014-07-15: 10 mL/h via INTRAVENOUS

## 2014-07-15 MED ORDER — INSULIN GLARGINE 100 UNIT/ML ~~LOC~~ SOLN
40.0000 [IU] | Freq: Every day | SUBCUTANEOUS | Status: DC
  Administered 2014-07-15 – 2014-07-22 (×8): 40 [IU] via SUBCUTANEOUS
  Filled 2014-07-15 (×9): qty 0.4

## 2014-07-15 MED ORDER — HEPARIN BOLUS VIA INFUSION
4000.0000 [IU] | Freq: Once | INTRAVENOUS | Status: AC
  Administered 2014-07-15: 4000 [IU] via INTRAVENOUS
  Filled 2014-07-15: qty 4000

## 2014-07-15 MED ORDER — HEPARIN (PORCINE) IN NACL 100-0.45 UNIT/ML-% IJ SOLN
2500.0000 [IU]/h | INTRAMUSCULAR | Status: DC
  Administered 2014-07-15 (×2): 2400 [IU]/h via INTRAVENOUS
  Administered 2014-07-16 – 2014-07-21 (×10): 2500 [IU]/h via INTRAVENOUS
  Filled 2014-07-15 (×19): qty 250

## 2014-07-15 NOTE — Progress Notes (Signed)
Subjective: Pt off vent. No issues overnight.  Objective: Vital signs in last 24 hours: Temp:  [98.2 F (36.8 C)-99.8 F (37.7 C)] 99.8 F (37.7 C) (02/03 0824) Pulse Rate:  [73-105] 99 (02/03 0900) Resp:  [9-27] 27 (02/03 0900) BP: (93-155)/(36-97) 155/74 mmHg (02/03 0900) SpO2:  [86 %-100 %] 92 % (02/03 0900) FiO2 (%):  [28 %-40 %] 28 % (02/03 0752) Weight:  [330 lb 7.5 oz (149.9 kg)] 330 lb 7.5 oz (149.9 kg) (02/03 0600)  PHYSICAL EXAMINATION: General: obese, awake, off vent, on trach collar. Neuro: no focal deficits, following commands, eyes open HEENT: [place]/AT [place]/OC: Normal mucosa. No lesion. Neck: Trach midline, in place. Ext: 1-2+ pitting edema in lower extremities Skin: chronic venous skin changes in lower extremities, erythema   Recent Labs  07/14/14 0500 07/15/14 0356  WBC 9.0 9.9  HGB 12.5* 12.6*  HCT 42.5 41.7  PLT 225 236    Recent Labs  07/14/14 0500 07/15/14 0356  NA 142 144  K 3.8 3.8  CL 102 103  CO2 35* 34*  GLUCOSE 250* 200*  BUN 18 15  CREATININE 0.64 0.69  CALCIUM 8.7 8.8    Medications:  I have reviewed the patient's current medications. Scheduled: . ampicillin-sulbactam (UNASYN) IV  3 g Intravenous Q8H  . antiseptic oral rinse  7 mL Mouth Rinse QID  . aspirin  81 mg Oral Daily  . brimonidine  1 drop Both Eyes BID  . budesonide  0.5 mg Nebulization BID  . chlorhexidine  15 mL Mouth Rinse BID  . dorzolamide-timolol  1 drop Both Eyes BID  . feeding supplement (PRO-STAT SUGAR FREE 64)  60 mL Per Tube TID  . feeding supplement (VITAL HIGH PROTEIN)  1,000 mL Per Tube Q24H  . insulin aspart  0-20 Units Subcutaneous 6 times per day  . insulin glargine  40 Units Subcutaneous QHS  . ipratropium-albuterol  3 mL Nebulization Q6H  . latanoprost  1 drop Both Eyes QHS  . pantoprazole (PROTONIX) IV  40 mg Intravenous Q24H   RUE:[id number]:sodium chloride, albuterol, fentaNYL, hydrALAZINE, metoprolol  Assessment/Plan: POD#1 s/p trach. Doing well, off  vent. Due to his difficult airway, will leave fresh trach in place for 2 weeks before changing to uncuff trach tube.   LOS: 5 days   Kari Montero,SUI W 07/15/2014, 10:01 AM

## 2014-07-15 NOTE — Progress Notes (Signed)
PULMONARY / CRITICAL CARE MEDICINE   Name: Blake Summers MRN: [id number] DOB: 11/25/1945    ADMISSION DATE:  07/10/2014 CONSULTATION DATE:  07/10/14  REFERRING MD :  Dr. Gonzella Lex  CHIEF COMPLAINT:  Shortness of breath  INITIAL PRESENTATION: Mr. Speir is a 69yo man w/ PMHx of obstructive sleep apnea, obesity hypoventilation syndrome, GERD, Type 2 DM, and chronic hypercarbic respiratory failure on BiPAP at home who presented with worsening SOB for the past week. Found to be in acute on chronic hypercarbic respiratory failure.   MAJOR EVENTS/TEST RESULTS: 1/29 CT Angio Chest: No PE. Cardiomegaly, sm R pleural effusion and interstitial thickening likely CHF.  1/29 failed NPPV. Intubated 1/30 Persistent AF. UFH gtt initiated. Screened for MIND study 1/31: copious thick secretions but passed SBT. Extubated and failed several hours later. Re-intubated after failed NPPV attempt. UA looked very raw and edematous on re-intubation. Abx started for presumed PNA  INDWELLING DEVICES:: ETT 1/29 >> trach XL #6 2/2 >>  MICRO DATA: Resp 1/30  >> moraxella Resp 1/31>>oral flora Blood 1/29 >> ng MRSA 1/29>>neg  Flu 1/29 neg  ANTIMICROBIALS:  None   SUBJECTIVE:  Awake and interactive RASS+1 desatn with suctioning   VITAL SIGNS: Temp:  [98.2 F (36.8 C)-99.8 F (37.7 C)] 99.8 F (37.7 C) (02/03 0824) Pulse Rate:  [73-105] 99 (02/03 0900) Resp:  [9-27] 27 (02/03 0900) BP: (93-155)/(36-97) 155/74 mmHg (02/03 0900) SpO2:  [86 %-100 %] 92 % (02/03 0900) FiO2 (%):  [28 %-40 %] 28 % (02/03 0752) Weight:  [149.9 kg (330 lb 7.5 oz)] 149.9 kg (330 lb 7.5 oz) (02/03 0600) HEMODYNAMICS:   Vent Mode:  [-] PRVC FiO2 (%):  [28 %-40 %] 28 % Set Rate:  [18 bmp] 18 bmp Vt Set:  [500 mL] 500 mL PEEP:  [5 cmH20] 5 cmH20 Pressure Support:  [8 cmH20] 8 cmH20 Plateau Pressure:  [18 cmH20-25 cmH20] 22 cmH20 INTAKE / OUTPUT:  Intake/Output Summary (Last 24 hours) at 07/15/14 1000 Last data filed at  07/15/14 0900  Gross per 24 hour  Intake 1335.5 ml  Output   1702 ml  Net -366.5 ml    PHYSICAL EXAMINATION: General: obese, plethoric, NAD Neuro: no focal deficits, moving all 4 ext, following commands, eyes open HEENT: [place]/AT Cardiovascular: irreg rate controlled<110, no murmurs  Lungs: course breath sounds b/l, decreased BL Abdomen: obese, edematous Ext: 1-2+ pitting edema in lower extremities Skin: chronic venous skin changes in lower extremities, erythema   LABS: CMP Latest Ref Rng 07/15/2014 07/14/2014 07/13/2014  Glucose 70 - 99 mg/dL 409(W) 119(J) 478(G)  BUN 6 - 23 mg/dL Creatinine 0.50 - 1.35 mg/dL 9.56 2.13 0.86  Sodium 135 - 145 mmol/L 144 142 141  Potassium 3.5 - 5.1 mmol/L 3.8 3.8 3.8  Chloride 96 - 112 mmol/L 103 102 99  CO2 19 - 32 mmol/L 34(H) 35(H) 36(H)  Calcium 8.4 - 10.5 mg/dL 8.8 8.7 8.5  Total Protein 6.0 - 8.3 g/dL - - -  Total Bilirubin 0.3 - 1.2 mg/dL - - -  Alkaline Phos 39 - 117 U/L - - -  AST 0 - 37 U/L - - -  ALT 0 - 53 U/L - - -   CBC Latest Ref Rng 07/15/2014 07/14/2014 07/13/2014  WBC 4.0 - 10.5 K/uL 9.9 9.0 9.6  Hemoglobin 13.0 - 17.0 g/dL 12.6(L) 12.5(L) 12.4(L)  Hematocrit 39.0 - 52.0 % 41.7 42.5 41.3  Platelets 150 - 400 K/uL 236 225 233  CXR:  cardiomegaly ASSESSMENT / PLAN:  PULMONARY A:  ETT 1/29>> 1/31, 1/31 >>  Acute on chronic hypercarbic respiratory failure OSA/OHS Mucous plugging resolving per CXR 1/31  Copious thick tan blood tinged secretions from ETT ?tracheal bronchitis  Mild pulm edema  Mod L pleural effusion (decreased) L chest white out - likely atx -resolved Remote smoking P:   Trach collar as tolerated daytime Prn Alb, Pulmicort Duoneb q6    CARDIOVASCULAR A:  H/O HTN, HLD AF rate controlled, CVR - unclear duration Mild cardiomegaly  P:  Heparin gtt . Aspirin 81 per tube. Cont hydralazine prn q4 SBP >170 Cont metoprolol prn 2.5-5 mg q3 prn HR >115 Echocardiogram  > poor study, no good  windows  RENAL A:   Hypokalemia , hypophos- repleted  P:   Monitor BMET (replete electrolytes prn)   Goal K >4, Mag >2  Monitor I/Os, daily wts    GASTROINTESTINAL A:   GERD, chronic PPI use NPO with TF  P:   Protonix 40 mg iv qd   HEMATOLOGIC A:   Normocytic anemia Hbg stable  P:  DVT px: full dose heparin for AF Monitor CBC intermittently Transfuse per usual ICU guidelines   INFECTIOUS A:  Leukocytosis ?tracheal bronchitis -moraxella Abx Rocephin 1/29>>1/29 1 dose, AZM 1/29>>1/29 1 dose Unasyn 1/31>> P:   Micro and abx as above Ct Unasyn until 2/5  ENDOCRINE A:   Type 2 DM with hyperglycemia (HA1C 8.4 05/27/14)  Low tsh ? Sick euthyroid; free T4 normal P:   Increase Lantus 40 Cont SSI-R   NEUROLOGIC A:   Acute encephalopathy, fully resolved Awake following commands   P:   RASS goal:0    FAMILY  - Updates: Updated pt, son, wife on 2/1   Plan for LTAC vs vent SNF - unclear if he will need nocturnal ventilation  Care during the described time interval was provided by me and/or other providers on the critical care team.  I have reviewed this patient's available data, including medical history, events of note, physical examination and test results as part of my evaluation  CC time x 3926m ALVA,RAKESH V. MD  07/15/2014, 10:00 AM

## 2014-07-15 NOTE — Progress Notes (Signed)
**Note De-Identified Kiersten Coss Obfuscation** Patient tolerating 28% ATC; RRT will cont to monitor.

## 2014-07-15 NOTE — Progress Notes (Addendum)
1300 mcg fentanyl wasted in sink. wittnessed by 2nd RN Verl BlalockHans Garon Melander.

## 2014-07-15 NOTE — Progress Notes (Signed)
ANTICOAGULATION CONSULT NOTE - Follow-up Consult  Pharmacy Consult for Heparin/Unasyn Indication: atrial fibrillation/CAP  Allergies  Allergen Reactions  . Neosporin [Neomycin-Polymyxin-Gramicidin] Other (See Comments)    unknown    Patient Measurements: Height: 5\' 7"  (170.2 cm) Weight: (!) 330 lb 7.5 oz (149.9 kg) IBW/kg (Calculated) : 66.1 Heparin Dosing Weight: 104 kg (calculated based on admit weight)  Vital Signs: Temp: 99.8 F (37.7 C) (02/03 0824) Temp Source: Oral (02/03 0824) BP: 155/74 mmHg (02/03 0900) Pulse Rate: 99 (02/03 0900)  Labs:  Recent Labs  07/13/14 0350 07/13/14 0945 07/13/14 1800 07/14/14 0500 07/15/14 0356  HGB 12.4*  --   --  12.5* 12.6*  HCT 41.3  --   --  42.5 41.7  PLT 233  --   --  225 236  HEPARINUNFRC  --  0.29* 0.50 0.30  --   CREATININE 0.72  --   --  0.64 0.69    Estimated Creatinine Clearance: 122.8 mL/min (by C-G formula based on Cr of 0.69).  Assessment: Pt. Was on IV heparin for new afib. Heparin was stopped yesterday evening prior to trach. Pt. Is doing well after procedure. Hgb/plt stable. Confirmed with Dr. Vassie LollAlva, will resume IV heparin. Heparin rate increased to 2400 units/hr yesterday.   Pt. Is also on D#4 unasyn for pneumonia, afebrile, wbc wnl, renal function wnl. per MD note, will continue until 2/5  Ceftriaxone x 1 Azithromycin x 1  1/31 Unasyn>>   1/29 Blood Cx x2>> ngtd 1/30 trach asp>> few M. Catarrhalis  Flu>> neg   Goal of Therapy:  Heparin level 0.3-0.7 units/ml Monitor platelets by anticoagulation protocol: Yes   Plan:  Heparin bolus 4000 units Heparin infusion slightly to 2400 units/hr.   F/u 6 hr heparin level at 1700 Daily heparin level and CBC while on therapy.  Continue Unasyn 3 gm IV Q 8 hours  Bayard HuggerMei Fareeha Evon, PharmD, BCPS  Clinical Pharmacist  Pager: [phone]   07/15/2014 10:24 AM

## 2014-07-15 NOTE — Progress Notes (Signed)
NUTRITION FOLLOW UP  Intervention:   Resume TF when able Vital High Protein at goal rate of 40 ml/h (960 ml per day) with Prostat 60ml TID to provide 1560 kcal, 174 g protein, 802 ml of water daily.   Nutrition Dx:   Inadequate oral intake related to inability to eat as evidenced by NPO status  Goal:   Enteral nutrition to provide 60-70% of estimated calorie needs (22-25 kcals/kg ideal body weight) and 100% estimated protein needs, based on ASPEN guidelines for hypocaloric, high protein feeding in critically ill individuals.   Monitor:   TF re-initiation tolerance/adquacy, labs, weight trends, vent status  Assessment:   69yo man w/ PMHx of obstructive sleep apnea, obesity hypoventilation syndrome, GERD, Type 2 DM, and chronic hypercarbic respiratory failure on BiPAP at home who presented with worsening SOB for the past week PTA.    Pt failed extubation and re intubated 1/31.  Trach placed 2/2  Per Nursing-Patient currently does not have tube to feed through, it was unable to be placed at bedside. Possibly to be done by IR.   Nutrition Focused Physical Exam: No wasting-patiently morbidly obese  Height: Ht Readings from Last 1 Encounters:  07/10/14 5\' 7"  (1.702 m)   IBW: 148 lbs (67.3 kg)  Weight Status:   Wt Readings from Last 1 Encounters:  07/15/14 330 lb 7.5 oz (149.9 kg)   BMI: Body mass index is 52.99 kg/(m^2).  Re-estimated needs:  Kcal: 2226 kcals using MSJ (1480-1680 = 22-25 kcal/kg IBW) Protein: >/=168 Fluid: per MD  Skin: Stage II pressure ulcer on both feet/incision on neck  Diet Order:     Intake/Output Summary (Last 24 hours) at 07/15/14 0959 Last data filed at 07/15/14 0900  Gross per 24 hour  Intake   1546 ml  Output   1852 ml  Net   -306 ml     Last BM: 2/3, diarrhea   Labs:   Recent Labs Lab 07/12/14 1642 07/13/14 0350 07/13/14 1330 07/14/14 0500 07/15/14 0356  NA  --  141  --  142 144  K  --  3.8  --  3.8 3.8  CL  --  99  --  102  103  CO2  --  36*  --  35* 34*  BUN  --  16  --  18 15  CREATININE  --  0.72  --  0.64 0.69  CALCIUM  --  8.5  --  8.7 8.8  MG 2.3 2.2 2.2  --   --   PHOS 3.8 1.8* 2.2*  --   --   GLUCOSE  --  239*  --  250* 200*    CBG (last 3)   Recent Labs  07/14/14 2350 07/15/14 0401 07/15/14 0822  GLUCAP 206* 178* 176*    Scheduled Meds: . ampicillin-sulbactam (UNASYN) IV  3 g Intravenous Q8H  . antiseptic oral rinse  7 mL Mouth Rinse QID  . aspirin  81 mg Oral Daily  . brimonidine  1 drop Both Eyes BID  . budesonide  0.5 mg Nebulization BID  . chlorhexidine  15 mL Mouth Rinse BID  . dorzolamide-timolol  1 drop Both Eyes BID  . feeding supplement (PRO-STAT SUGAR FREE 64)  60 mL Per Tube TID  . feeding supplement (VITAL HIGH PROTEIN)  1,000 mL Per Tube Q24H  . insulin aspart  0-20 Units Subcutaneous 6 times per day  . insulin glargine  35 Units Subcutaneous QHS  . ipratropium-albuterol  3 mL Nebulization  Q6H  . latanoprost  1 drop Both Eyes QHS  . pantoprazole (PROTONIX) IV  40 mg Intravenous Q24H    Continuous Infusions: . fentaNYL infusion INTRAVENOUS Stopped (07/14/14 1900)    .Christophe Louis RD, LDN Nutrition [phone] 07/15/2014 2:15 PM

## 2014-07-15 NOTE — Care Management Note (Signed)
    Page 1 of 2   07/23/2014     2:02:35 PM CARE MANAGEMENT NOTE 07/23/2014  Patient:  Blake Summers,Blake Summers   Account Number:  [id number]  Date Initiated:  07/13/2014  Documentation initiated by:  [facility]AR[facility]  Subjective/Objective Assessment:   Admitted with hypercapnic resp failure.     Action/Plan:   Anticipated DC Date:  07/20/2014   Anticipated DC Plan:  HOME W HOME HEALTH SERVICES      DC Planning Services  CM consult      Choice offered to / List presented to:             Status of service:  Completed, signed off Medicare Important Message given?  YES (If response is "NO", the following Medicare IM given date fields will be blank) Date Medicare IM given:  07/15/2014 Medicare IM given by:  [facility] Date Additional Medicare IM given:  07/21/2014 Additional Medicare IM given [facility]:  [name]  Discharge Disposition:  SKILLED NURSING FACILITY  Per UR Regulation:  Reviewed for med. necessity/level of care/duration of stay  If discussed at Long Length of Stay Meetings, dates discussed:    Comments:  ContactFoy [place]:  Mckowen,Linda Rae Spouse [phone]  07/20/14 1400 [name] RN MSN BSN CCM Spouse wants to provide care for pt @ home - discussed requirements for taking pt home on vent.  She then states that she has no reliable back-up because son has health problems and dtr has small children and is in the process of a marital separation.  She hopes to eventually find additional support but understands pt will need to d/c to vent SNF first.  Will notify CSW.  07-17-14 11:30am Avie ArenasSarah Brown, RNBSN [phone] Discussed dc options with physician.  Patient may qualify for Ltach if meets inpt critieria for 21 days - is trached for 7 and has 3 failed documented weans.  At this time patient is on trach collar - 40% during the day and is on vent rest during nights.  on 13 hours last night. Physician feels will need vent/SNF on discharge - could be as early as  next week, anticipates will need vent at night. SW referral made.  07-15-14 2:40pm Avie ArenasSarah Brown, RNBSN [phone] Patient trached on 07-14-14.  Sitting up in chair.  Awake and alert writing notes.  Lives at home with wife - mostly independent.  Has a walker and w/c at home.  Discussed caring for trach as this will be a part on him now. Reluctantly nods head affirmative.  Discussed possible need for rehab for mobilization and further instructions and training on caring for his trach, which is his ultimate goal.  Agreeable to ST rehab.  SW referral placed.

## 2014-07-16 ENCOUNTER — Encounter (HOSPITAL_COMMUNITY): Payer: Self-pay | Admitting: Otolaryngology

## 2014-07-16 ENCOUNTER — Ambulatory Visit: Payer: Medicare Other | Admitting: Registered Nurse

## 2014-07-16 LAB — GLUCOSE, CAPILLARY
GLUCOSE-CAPILLARY: 179 mg/dL — AB (ref 70–99)
GLUCOSE-CAPILLARY: 159 mg/dL — AB (ref 70–99)
Glucose-Capillary: 122 mg/dL — ABNORMAL HIGH (ref 70–99)
Glucose-Capillary: 170 mg/dL — ABNORMAL HIGH (ref 70–99)
Glucose-Capillary: 196 mg/dL — ABNORMAL HIGH (ref 70–99)
Glucose-Capillary: 211 mg/dL — ABNORMAL HIGH (ref 70–99)

## 2014-07-16 LAB — CBC
HCT: 41.9 % (ref 39.0–52.0)
HEMOGLOBIN: 12.7 g/dL — AB (ref 13.0–17.0)
MCH: 27.3 pg (ref 26.0–34.0)
MCHC: 30.3 g/dL (ref 30.0–36.0)
MCV: 90.1 fL (ref 78.0–100.0)
PLATELETS: 239 10*3/uL (ref 150–400)
RBC: 4.65 MIL/uL (ref 4.22–5.81)
RDW: 15.7 % — AB (ref 11.5–15.5)
WBC: 11.1 10*3/uL — ABNORMAL HIGH (ref 4.0–10.5)

## 2014-07-16 LAB — HEPARIN LEVEL (UNFRACTIONATED): HEPARIN UNFRACTIONATED: 0.27 [IU]/mL — AB (ref 0.30–0.70)

## 2014-07-16 NOTE — Progress Notes (Signed)
UR Completed.  [phone]  

## 2014-07-16 NOTE — Evaluation (Signed)
[facility] Speaking Valve - Evaluation Patient Details  Name: Kerin SalenJoseph L Riker MRN: [id number] Date of Birth: [birth year]  Today's Date: 07/16/2014 Time: [id number] SLP Time Calculation (min) (ACUTE ONLY): 41 min  Past Medical History:  Past Medical History  Diagnosis Date  . GERD 07/06/2010  . Edema 10/28/2007  . DIABETES MELLITUS, TYPE II 05/04/2007  . HYPERLIPIDEMIA 07/08/2008  . ALLERGIC RHINITIS 07/08/2008  . GLAUCOMA 07/08/2008  . CEREBROVASCULAR ACCIDENT, HX OF 07/08/2008  . OBSTRUCTIVE SLEEP APNEA 03/06/2008  . DIABETIC ULCER, LEFT LEG 08/23/2009  . 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 HYPERTENSION 10/28/2007  . VENOUS INSUFFICIENCY 03/08/2009  . FATTY LIVER DISEASE 05/19/2008  . BENIGN PROSTATIC HYPERTROPHY 07/08/2008  . 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 NEPHROLITHIASIS, HX OF 07/08/2008  . Morbid obesity   . DM nephropathy/sclerosis   . 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 Lumbar spondylosis   . Diarrhea 06/08/2013   Past Surgical History:  Past Surgical History  Procedure Laterality Date  . Lithotripsy  2007  . Tracheostomy tube placement N/A 07/14/2014    Procedure: TRACHEOSTOMY;  Surgeon: Darletta MollSui W Teoh, MD;  [facility];  Service: ENT;  Laterality: N/A;   HPI:  [name] is a 69yo man w/ PMHx of obstructive sleep apnea, obesity hypoventilation syndrome, GERD, Type 2 DM, and chronic hypercarbic respiratory failure on BiPAP at home who presented with worsening SOB for the past week. Found to be in acute on chronic hypercarbic respiratory failure. Pt intubated 1/29 s/p trach 2/2    Assessment / Plan / Recommendation Clinical Impression  Pt tolerated the speaking valve well for 38 minutes under direct SLP supervision, with adequate breath support for phonation at the conversational level. No attempts to clear secretions observed or needed. Recommend for patient to wear during waking hours as tolerated with intermittent supervision as pt has not yet  practiced removal of the valve - RN present and aware. Will follow for continued tolerance as well as education related to PMV maintenance.    SLP Assessment  Patient needs continued Speech Lanaguage Pathology Services    Follow Up Recommendations  Home health SLP    Frequency and Duration min 2x/week  2 weeks   Pertinent Vitals/Pain VS stable, WFL    SLP Goals Potential to Achieve Goals (ACUTE ONLY): Good   PMSV Trial  PMSV was placed for: 38 min (and left in place upon departure) Able to redirect subglottic air through upper airway: Yes Able to Attain Phonation: Yes Voice Quality: Normal Able to Expectorate Secretions: No attempts Breath Support for Phonation: Adequate Intelligibility: Intelligible Respirations During Trial: 20 SpO2 During Trial: 94 % Pulse During Trial: 115 Behavior: Alert;Controlled;Cooperative;Expresses self well   Tracheostomy Tube       Vent Dependency  FiO2 (%): 40 %    Cuff Deflation Trial Tolerated Cuff Deflation: Yes Length of Time for Cuff Deflation Trial: 45 min Behavior: Alert;Controlled;Cooperative;Expresses self well     Maxcine HamLaura Paiewonsky, M.A. CCC-SLP [phone]  Maxcine Hamaiewonsky, Essense Bousquet 07/16/2014, 5:05 PM

## 2014-07-16 NOTE — Progress Notes (Signed)
Urine drug screen for this encounter is consistent for prescribed medication 

## 2014-07-16 NOTE — Progress Notes (Signed)
Subjective: No issues overnight. Tolerated TC yesterday. Back on vent overnight.  Objective: Vital signs in last 24 hours: Temp:  [98 F (36.7 C)-100.8 F (38.2 C)] 98 F (36.7 C) (02/04 0748) Pulse Rate:  [86-114] 98 (02/04 0740) Resp:  [17-28] 23 (02/04 0740) BP: (96-155)/(48-110) 122/59 mmHg (02/04 0740) SpO2:  [88 %-100 %] 96 % (02/04 0740) FiO2 (%):  [35 %-40 %] 40 % (02/04 0748)  PHYSICAL EXAMINATION: General: obese, awake, on vent. Neuro: no focal deficits, following commands, eyes open HEENT: [place]/AT [place]/OC: Normal mucosa. No lesion. Neck: Trach midline, in place. Ext: 1-2+ pitting edema in lower extremities Skin: chronic venous skin changes in lower extremities, erythema    Recent Labs  07/15/14 0356 07/16/14 0500  WBC 9.9 11.1*  HGB 12.6* 12.7*  HCT 41.7 41.9  PLT 236 239    Recent Labs  07/14/14 0500 07/15/14 0356  NA 142 144  K 3.8 3.8  CL 102 103  CO2 35* 34*  GLUCOSE 250* 200*  BUN 18 15  CREATININE 0.64 0.69  CALCIUM 8.7 8.8    Medications:  I have reviewed the patient's current medications. Scheduled: . ampicillin-sulbactam (UNASYN) IV  3 g Intravenous Q8H  . antiseptic oral rinse  7 mL Mouth Rinse QID  . aspirin  81 mg Oral Daily  . brimonidine  1 drop Both Eyes BID  . budesonide  0.5 mg Nebulization BID  . chlorhexidine  15 mL Mouth Rinse BID  . dorzolamide-timolol  1 drop Both Eyes BID  . feeding supplement (PRO-STAT SUGAR FREE 64)  60 mL Per Tube TID  . feeding supplement (VITAL HIGH PROTEIN)  1,000 mL Per Tube Q24H  . insulin aspart  0-20 Units Subcutaneous 6 times per day  . insulin glargine  40 Units Subcutaneous QHS  . ipratropium-albuterol  3 mL Nebulization Q6H  . latanoprost  1 drop Both Eyes QHS  . pantoprazole (PROTONIX) IV  40 mg Intravenous Q24H   WUJ:WJXBJYPRN:sodium chloride, albuterol, fentaNYL, hydrALAZINE, metoprolol  Assessment/Plan: POD#2 s/p trach. Doing well, tolerated trach collar yesterday. Due to his difficult  airway, will leave fresh trach in place for 2 weeks before changing to uncuff trach tube.    LOS: 6 days   Blake Summers,SUI W 07/16/2014, 8:23 AM

## 2014-07-16 NOTE — Progress Notes (Signed)
PULMONARY / CRITICAL CARE MEDICINE   Name: Blake Summers MRN: [id number] DOB: 09-25-1945    ADMISSION DATE:  07/10/2014 CONSULTATION DATE:  07/10/14  REFERRING MD :  Dr. Gonzella Lexhungel  CHIEF COMPLAINT:  Shortness of breath  INITIAL PRESENTATION: Blake Summers is a 69yo man w/ PMHx of obstructive sleep apnea, obesity hypoventilation syndrome, GERD, Type 2 DM, and chronic hypercarbic respiratory failure on BiPAP at home who presented with worsening SOB for the past week. Found to be in acute on chronic hypercarbic respiratory failure.   MAJOR EVENTS/TEST RESULTS: 1/29 CT Angio Chest: No PE. Cardiomegaly, sm R pleural effusion and interstitial thickening likely CHF.  1/29 failed NPPV. Intubated 1/30 Persistent AF. UFH gtt initiated. Screened for MIND study 1/31: copious thick secretions but passed SBT. Extubated and failed several hours later. Re-intubated after failed NPPV attempt. UA looked very raw and edematous on re-intubation. Abx started for presumed PNA  INDWELLING DEVICES:: ETT 1/29 >> trach XL #6 2/2 >>  MICRO DATA: Resp 1/30  >> moraxella Resp 1/31>>oral flora Blood 1/29 >> ng MRSA 1/29>>neg  Flu 1/29 neg  ANTIMICROBIALS:  None   SUBJECTIVE:  Tolerated ATC daytime 2/3    VITAL SIGNS: Temp:  [98 F (36.7 C)-99.6 F (37.6 C)] 98.7 F (37.1 C) (02/04 1500) Pulse Rate:  [75-110] 110 (02/04 1700) Resp:  [13-28] 13 (02/04 1700) BP: (88-160)/(44-126) 152/76 mmHg (02/04 1700) SpO2:  [89 %-100 %] 93 % (02/04 1700) FiO2 (%):  [40 %] 40 % (02/04 1607) HEMODYNAMICS:   Vent Mode:  [-] PRVC FiO2 (%):  [40 %] 40 % Set Rate:  [18 bmp] 18 bmp Vt Set:  [500 mL] 500 mL PEEP:  [5 cmH20] 5 cmH20 Plateau Pressure:  [26 cmH20-28 cmH20] 28 cmH20 INTAKE / OUTPUT:  Intake/Output Summary (Last 24 hours) at 07/16/14 1759 Last data filed at 07/16/14 1500  Gross per 24 hour  Intake 3010.16 ml  Output    700 ml  Net 2310.16 ml    PHYSICAL EXAMINATION: General: obese, plethoric,  NAD Neuro: no focal deficits, moving all 4 ext, following commands, eyes open HEENT: [place]/AT Cardiovascular: irreg , no murmurs  Lungs: course breath sounds b/l, decreased BL Abdomen: obese, edematous Ext: 1-2+ pitting edema in lower extremities Skin: chronic venous skin changes in lower extremities, erythema   LABS: CMP Latest Ref Rng 07/15/2014 07/14/2014 07/13/2014  Glucose 70 - 99 mg/dL 782(N200(H) 562(Z250(H) 308(M239(H)  BUN 6 - 23 mg/dL 15 18 16   Creatinine 0.50 - 1.35 mg/dL 5.780.69 4.690.64 6.290.72  Sodium 135 - 145 mmol/L 144 142 141  Potassium 3.5 - 5.1 mmol/L 3.8 3.8 3.8  Chloride 96 - 112 mmol/L 103 102 99  CO2 19 - 32 mmol/L 34(H) 35(H) 36(H)  Calcium 8.4 - 10.5 mg/dL 8.8 8.7 8.5  Total Protein 6.0 - 8.3 g/dL - - -  Total Bilirubin 0.3 - 1.2 mg/dL - - -  Alkaline Phos 39 - 117 U/L - - -  AST 0 - 37 U/L - - -  ALT 0 - 53 U/L - - -   CBC Latest Ref Rng 07/16/2014 07/15/2014 07/14/2014  WBC 4.0 - 10.5 K/uL 11.1(H) 9.9 9.0  Hemoglobin 13.0 - 17.0 g/dL 12.7(L) 12.6(L) 12.5(L)  Hematocrit 39.0 - 52.0 % 41.9 41.7 42.5  Platelets 150 - 400 K/uL 239 236 225    CXR:  cardiomegaly ASSESSMENT / PLAN:  PULMONARY A:  ETT 1/29>> 1/31, 1/31 >>  Acute on chronic hypercarbic respiratory failure OSA/OHS Mucous plugging resolving per  CXR 1/31  Copious thick tan blood tinged secretions from ETT ?tracheal bronchitis  Mild pulm edema  Mod L pleural effusion (decreased) L chest white out - likely atx -resolved Remote smoking P:   Trach collar as tolerated daytime Prn Alb, Pulmicort Duoneb q6    CARDIOVASCULAR A:  H/O HTN, HLD AF rate controlled, CVR - unclear duration Echocardiogram  > poor study, no good windows P:  Heparin gtt  - consider coumadin soon . Aspirin 81 per tube. Cont hydralazine prn q4 SBP >170 Cont metoprolol prn 2.5-5 mg q3 prn HR >115   RENAL A:   Hypokalemia , hypophos- repleted  P:   Monitor BMET (replete electrolytes prn)   Goal K >4, Mag >2  Monitor I/Os, daily wts     GASTROINTESTINAL A:   GERD, chronic PPI use NPO with TF  P:   Protonix 40 mg iv qd   HEMATOLOGIC A:   Normocytic anemia Hbg stable  P:  DVT px: full dose heparin for AF Monitor CBC intermittently Transfuse per usual ICU guidelines   INFECTIOUS A:  Leukocytosis ?tracheal bronchitis -moraxella Abx Rocephin 1/29>>1/29 1 dose, AZM 1/29>>1/29 1 dose Unasyn 1/31>> P:   Micro and abx as above Ct Unasyn until 2/5  ENDOCRINE A:   Type 2 DM with hyperglycemia (HA1C 8.4 05/27/14)  Low tsh ? Sick euthyroid; free T4 normal P:   Increase Lantus 40 Cont SSI-R   NEUROLOGIC A:   Acute encephalopathy, fully resolved Deconditioning  P:   PT/OT  PM valve -speech eval   FAMILY  - Updates: Updated pt, son, wife on 2/1   Plan for LTAC vs vent SNF - unclear if he will need nocturnal ventilation  Care during the described time interval was provided by me and/or other providers on the critical care team.  I have reviewed this patient's available data, including medical history, events of note, physical examination and test results as part of my evaluation  CC time x 60m ALVA,RAKESH V. MD  07/16/2014, 5:59 PM

## 2014-07-16 NOTE — Evaluation (Signed)
Clinical/Bedside Swallow Evaluation Patient Details  Name: IHAN PAT MRN: [id number] Date of Birth: 01-05-1946  Today's Date: 07/16/2014 Time: SLP Start Time (ACUTE ONLY): 1615 SLP Stop Time (ACUTE ONLY): 1656 SLP Time Calculation (min) (ACUTE ONLY): 41 min  Past Medical History:  Past Medical History  Diagnosis Date  . GERD 07/06/2010  . Edema 10/28/2007  . DIABETES MELLITUS, TYPE II 05/04/2007  . HYPERLIPIDEMIA 07/08/2008  . ALLERGIC RHINITIS 07/08/2008  . GLAUCOMA 07/08/2008  . CEREBROVASCULAR ACCIDENT, HX OF 07/08/2008  . OBSTRUCTIVE SLEEP APNEA 03/06/2008  . DIABETIC ULCER, LEFT LEG 08/23/2009  . 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 HYPERTENSION 10/28/2007  . VENOUS INSUFFICIENCY 03/08/2009  . FATTY LIVER DISEASE 05/19/2008  . BENIGN PROSTATIC HYPERTROPHY 07/08/2008  . 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 NEPHROLITHIASIS, HX OF 07/08/2008  . Morbid obesity   . DM nephropathy/sclerosis   . 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 Lumbar spondylosis   . Diarrhea 06/08/2013   Past Surgical History:  Past Surgical History  Procedure Laterality Date  . Lithotripsy  2007  . Tracheostomy tube placement N/A 07/14/2014    Procedure: TRACHEOSTOMY;  Surgeon: Darletta Moll, MD;  Location: [facility] OR;  Service: ENT;  Laterality: N/A;   HPI:  Mr. Treadway is a 69yo man w/ PMHx of obstructive sleep apnea, obesity hypoventilation syndrome, GERD, Type 2 DM, and chronic hypercarbic respiratory failure on BiPAP at home who presented with worsening SOB for the past week. Found to be in acute on chronic hypercarbic respiratory failure. Pt intubated 1/29 s/p trach 2/2    Assessment / Plan / Recommendation Clinical Impression  Pt has overt signs of airway compromise with ice chip trials, including immediate throat clearing and occasional coughing on ~80% of trials. No overt signs of aspiration are observed with pureed solids, although pt remains at risk given current respiratory demand and  intubations with MD report of a "very raw, edematous" upper airway. Recommend to allow small bites of puree to administer medications as NGT has been pulled, however would hold further POs until FEES can be completed - will try for next date.    Aspiration Risk  Severe    Diet Recommendation NPO except meds   Medication Administration: Crushed with puree    Other  Recommendations Recommended Consults: FEES Oral Care Recommendations: Oral care Q4 per protocol   Follow Up Recommendations   (tba)    Frequency and Duration min 2x/week      Pertinent Vitals/Pain [facility]    SLP Swallow Goals     Swallow Study Prior Functional Status       General HPI: Mr. Reister is a 69yo man w/ PMHx of obstructive sleep apnea, obesity hypoventilation syndrome, GERD, Type 2 DM, and chronic hypercarbic respiratory failure on BiPAP at home who presented with worsening SOB for the past week. Found to be in acute on chronic hypercarbic respiratory failure. Pt intubated 1/29 s/p trach 2/2  Type of Study: Bedside swallow evaluation Previous Swallow Assessment: none in chart Diet Prior to this Study: NPO Temperature Spikes Noted: Yes Respiratory Status: Trach collar Trach Size and Type: Cuff;#6;Extra long;Deflated;With PMSV in place History of Recent Intubation: Yes Length of Intubations (days): 5 days (across two intubations) Date extubated:  (trach 2/2) Behavior/Cognition: Alert;Cooperative;Pleasant mood Oral Cavity - Dentition: Adequate natural dentition Self-Feeding Abilities: Able to feed self Patient Positioning: Upright in chair Baseline Vocal Quality: Clear Volitional Cough: Strong    Oral/Motor/Sensory Function Overall Oral Motor/Sensory Function: Appears  within functional limits for tasks assessed   Ice Chips Ice chips: Impaired Presentation: Self Fed;Spoon Pharyngeal Phase Impairments: Throat Clearing - Immediate;Cough - Immediate   Thin Liquid Thin Liquid: Not tested    Nectar Thick  Nectar Thick Liquid: Not tested   Honey Thick Honey Thick Liquid: Not tested   Puree Puree: Within functional limits Presentation: Self Fed;Spoon   Solid   GO    Solid: Not tested       Maxcine HamLaura Paiewonsky, M.A. CCC-SLP [phone]  Maxcine Hamaiewonsky, Garin Mata 07/16/2014,5:14 PM

## 2014-07-16 NOTE — Progress Notes (Signed)
ANTICOAGULATION CONSULT NOTE - Follow-up Consult  Pharmacy Consult for Heparin/Unasyn Indication: atrial fibrillation/CAP  Allergies  Allergen Reactions  . Neosporin [Neomycin-Polymyxin-Gramicidin] Other (See Comments)    unknown    Patient Measurements: Height: 5\' 7"  (170.2 cm) Weight: (!) 330 lb 7.5 oz (149.9 kg) IBW/kg (Calculated) : 66.1 Heparin Dosing Weight: 104 kg (calculated based on admit weight)  Vital Signs: Temp: 98 F (36.7 C) (02/04 0748) Temp Source: Oral (02/04 0748) BP: 122/59 mmHg (02/04 0740) Pulse Rate: 98 (02/04 0740)  Labs:  Recent Labs  07/14/14 0500 07/15/14 0356 07/15/14 1726 07/16/14 0500  HGB 12.5* 12.6*  --  12.7*  HCT 42.5 41.7  --  41.9  PLT 225 236  --  239  HEPARINUNFRC 0.30  --  0.30 0.27*  CREATININE 0.64 0.69  --   --     Estimated Creatinine Clearance: 122.8 mL/min (by C-G formula based on Cr of 0.69).  Assessment: Pt. Was on IV heparin for new afib. Heparin level 0.27, slightly subtherapeutic on 2400 units/hr. CBC stable, No bleeding noted per chart.  Goal of Therapy:  Heparin level 0.3-0.7 units/ml Monitor platelets by anticoagulation protocol: Yes   Plan:  Increase Heparin rate slightly to 2500 units/hr.   Daily heparin level and CBC while on therapy.   Bayard HuggerMei Amaree Leeper, PharmD, BCPS  Clinical Pharmacist  Pager: [phone]   07/16/2014 10:39 AM

## 2014-07-16 NOTE — Clinical Social Work Note (Signed)
Attempted to assess patient, but patient did not respond will try to contact spouse tomorrow to complete assessment.  Ervin KnackEric R. Shishir Krantz, MSW, Theresia MajorsLCSWA [phone] 07/16/2014 4:47 PM

## 2014-07-16 NOTE — Trach Care Team (Signed)
Trach Care Progression Note   Patient Details Name: Kerin SalenJoseph L Stoltzfus MRN: [id number] DOB: [birth year] Today's Date: 07/16/2014   Tracheostomy Assessment    Tracheostomy Shiley 6 mm Cuffed (Active)  Status Capped 07/16/2014 12:40 PM  Site Assessment Clean;Dry 07/16/2014 12:40 PM  Site Care Cleansed;Dried;Dressing applied 07/16/2014  4:30 AM  Inner Cannula Care Changed/new 07/16/2014  4:30 AM  Ties Assessment Clean;Dry;Secure 07/16/2014 12:40 PM  Cuff pressure (cm) 28 cm 07/16/2014  4:30 AM  Emergency Equipment at bedside Yes 07/16/2014 12:40 PM     Care Needs Suture removal post-op day #7 : 07/21/14   Respiratory Therapy O2 Device: Tracheostomy Collar FiO2 (%): 40 % SpO2: 94 % Who was educated?:  (none needed at this time) Follow up recommendations:  (will continue to follow for pt progress) Respiratory barriers to progression:  (plan is for pt to eventually go home with trach ?when )    Speech Language Pathology      Physical Therapy Ambulation/Gait assistance: Min guard PT Recommendation/Assessment: Patient needs continued PT services Follow Up Recommendations: Home health PT, Supervision for mobility/OOB (pending respiratory status and vent management) PT equipment: None recommended by PT    Occupational Therapy      Nutritional Patient's Current Diet: Tube feeding, NPO Tube Feeding: Vital High Protein Tube Feeding Frequency: Continuous Tube Feeding Strength: Full strength    Case Management/Social Work      Theatre managerrovider Trach Care Team/Provider Recommendations Trach Care Team Members Present-  Cherylin MylarLauren Doyle, RT, Shon BatonJenna Holloman, SW, Joaquin CourtsKimberly Harris, RD   Trach collar trials. Continue to follow.          Adelai Achey, Silva BandyDebra Anita (scribe for team) 07/16/2014, 2:49 PM

## 2014-07-16 NOTE — Evaluation (Signed)
Physical Therapy Evaluation Patient Details Name: Blake Summers MRN: [id number] DOB: [birth year] Today's Date: 07/16/2014   History of Present Illness  Blake Summers is a 69yo man w/ PMHx of obstructive sleep apnea, obesity hypoventilation syndrome, GERD, Type 2 DM, and chronic hypercarbic respiratory failure on BiPAP at home who presented with worsening SOB for the past week. Found to be in acute on chronic hypercarbic respiratory failure. Pt intubated 1/29 s/p trach 2/2  Clinical Impression  Blake Summers was up in chair on arrival and reports long term use of WC as main transport due to chronic back and knee pain. Pt with wife at home who assists with ADLs. Pt with limited baseline mobility and will benefit from acute therapy to maximize mobility and function acutely to decrease burden of care while addressing all below functional deficits.     Follow Up Recommendations Home health PT;Supervision for mobility/OOB (pending respiratory status and vent management)    Equipment Recommendations  None recommended by PT    Recommendations for Other Services Speech consult     Precautions / Restrictions Precautions Precautions: Fall Precaution Comments: trach, panda      Mobility  Bed Mobility               General bed mobility comments: pt in chair on arrival  Transfers Overall transfer level: Needs assistance   Transfers: Sit to/from Stand Sit to Stand: Min guard         General transfer comment: cues for hand placement  Ambulation/Gait Ambulation/Gait assistance: Min guard Ambulation Distance (Feet): 10 Feet Assistive device: Rolling walker (2 wheeled) Gait Pattern/deviations: Step-through pattern;Wide base of support;Shuffle;Trunk flexed   Gait velocity interpretation: Below normal speed for age/gender General Gait Details: cues for position in Rw and limited by fatigue pt reports chronic back and knee pain  Stairs            Wheelchair Mobility    Modified  Rankin (Stroke Patients Only)       Balance Overall balance assessment: Needs assistance   Sitting balance-Leahy Scale: Fair       Standing balance-Leahy Scale: Poor                               Pertinent Vitals/Pain Pain Assessment: No/denies pain  HR 105 sats 94% on 35% trach collar    Home Living Family/patient expects to be discharged to:: Private residence Living Arrangements: Spouse/significant other Available Help at Discharge: Family;Available 24 hours/day Type of Home: House Home Access: Ramped entrance     Home Layout: One level Home Equipment: Walker - 2 wheels;Shower seat;Bedside commode;Wheelchair - manual      Prior Function Level of Independence: Needs assistance   Gait / Transfers Assistance Needed: pt states [facility] and walk max 10' with RW into bathroom  ADL's / Homemaking Assistance Needed: wife assists with bathing and dressing         Hand Dominance        Extremity/Trunk Assessment   Upper Extremity Assessment: Generalized weakness           Lower Extremity Assessment: Generalized weakness      Cervical / Trunk Assessment: Kyphotic  Communication   Communication: Tracheostomy  Cognition Arousal/Alertness: Awake/alert Behavior During Therapy: WFL for tasks assessed/performed Overall Cognitive Status: Within Functional Limits for tasks assessed  General Comments      Exercises        Assessment/Plan    PT Assessment Patient needs continued PT services  PT Diagnosis Difficulty walking;Generalized weakness   PT Problem List Decreased strength;Decreased range of motion;Decreased activity tolerance;Decreased balance;Decreased mobility;Cardiopulmonary status limiting activity;Decreased knowledge of use of DME;Obesity  PT Treatment Interventions Gait training;Functional mobility training;Therapeutic activities;Therapeutic exercise;Patient/family education   PT Goals  (Current goals can be found in the Care Plan section) Acute Rehab PT Goals Patient Stated Goal: be able to talk PT Goal Formulation: With patient Time For Goal Achievement: 07/30/14 Potential to Achieve Goals: Fair    Frequency Min 2X/week   Barriers to discharge        Co-evaluation               End of Session   Activity Tolerance: Patient limited by fatigue Patient left: in chair;with call bell/phone within reach Nurse Communication: Mobility status         Time: 1914-7829 PT Time Calculation (min) (ACUTE ONLY): 28 min   Charges:   PT Evaluation $Initial PT Evaluation Tier I: 1 Procedure PT Treatments $Therapeutic Activity: 8-22 mins   PT G CodesDelorse Summers 07/16/2014, 12:03 PM Blake Summers, PT [phone]

## 2014-07-16 NOTE — Progress Notes (Addendum)
Pt seen at this time for trach consult.  Pt on 40% ATC tolerating well, #[id number] secure.  Per MD note plan is for pt to continue ATC during day/ vent QHS. Pt could possibly be discharged home, will continue to follow pt and start trach education when discharge plan in place. No back up trach at bedside, RN aware.

## 2014-07-17 DIAGNOSIS — Z930 Tracheostomy status: Secondary | ICD-10-CM

## 2014-07-17 LAB — CBC
HCT: 40.7 % (ref 39.0–52.0)
Hemoglobin: 12.1 g/dL — ABNORMAL LOW (ref 13.0–17.0)
MCH: 27.4 pg (ref 26.0–34.0)
MCHC: 29.7 g/dL — ABNORMAL LOW (ref 30.0–36.0)
MCV: 92.1 fL (ref 78.0–100.0)
PLATELETS: 228 10*3/uL (ref 150–400)
RBC: 4.42 MIL/uL (ref 4.22–5.81)
RDW: 15.8 % — ABNORMAL HIGH (ref 11.5–15.5)
WBC: 10.3 10*3/uL (ref 4.0–10.5)

## 2014-07-17 LAB — CULTURE, BLOOD (ROUTINE X 2)
CULTURE: NO GROWTH
Culture: NO GROWTH

## 2014-07-17 LAB — GLUCOSE, CAPILLARY
Glucose-Capillary: 101 mg/dL — ABNORMAL HIGH (ref 70–99)
Glucose-Capillary: 109 mg/dL — ABNORMAL HIGH (ref 70–99)
Glucose-Capillary: 120 mg/dL — ABNORMAL HIGH (ref 70–99)
Glucose-Capillary: 173 mg/dL — ABNORMAL HIGH (ref 70–99)
Glucose-Capillary: 232 mg/dL — ABNORMAL HIGH (ref 70–99)

## 2014-07-17 LAB — HEPARIN LEVEL (UNFRACTIONATED): Heparin Unfractionated: 0.65 IU/mL (ref 0.30–0.70)

## 2014-07-17 LAB — BASIC METABOLIC PANEL
ANION GAP: 7 (ref 5–15)
BUN: 14 mg/dL (ref 6–23)
CALCIUM: 9.3 mg/dL (ref 8.4–10.5)
CO2: 35 mmol/L — ABNORMAL HIGH (ref 19–32)
Chloride: 109 mmol/L (ref 96–112)
Creatinine, Ser: 0.74 mg/dL (ref 0.50–1.35)
GFR calc Af Amer: >90 mL/min (ref >90)
GFR calc non Af Amer: >90 mL/min (ref >90)
Glucose, Bld: 120 mg/dL — ABNORMAL HIGH (ref 70–99)
Potassium: 3.9 mmol/L (ref 3.5–5.1)
Sodium: 151 mmol/L — ABNORMAL HIGH (ref 135–145)

## 2014-07-17 LAB — PROTIME-INR
INR: 1.23 (ref 0.00–1.49)
Prothrombin Time: 15.6 seconds — ABNORMAL HIGH (ref 11.6–15.2)

## 2014-07-17 MED ORDER — OXYCODONE-ACETAMINOPHEN 5-325 MG PO TABS: 2.0000 | ORAL_TABLET | Freq: Four times a day (QID) | ORAL | Status: DC | PRN

## 2014-07-17 MED ORDER — OXYCODONE-ACETAMINOPHEN 5-325 MG PO TABS
2.0000 | ORAL_TABLET | ORAL | Status: DC | PRN
  Administered 2014-07-17 – 2014-07-19 (×3): 2 via ORAL
  Filled 2014-07-17 (×4): qty 2

## 2014-07-17 MED ORDER — DEXTROSE 5 % IV SOLN
INTRAVENOUS | Status: DC
  Administered 2014-07-17 – 2014-07-19 (×3): via INTRAVENOUS

## 2014-07-17 MED ORDER — WARFARIN - PHARMACIST DOSING INPATIENT
Freq: Every day | Status: DC
  Administered 2014-07-19: 18:00:00
  Administered 2014-07-21: 1

## 2014-07-17 MED ORDER — INSULIN ASPART 100 UNIT/ML ~~LOC~~ SOLN
0.0000 [IU] | Freq: Three times a day (TID) | SUBCUTANEOUS | Status: DC
  Administered 2014-07-18 – 2014-07-19 (×6): 3 [IU] via SUBCUTANEOUS
  Administered 2014-07-20: 2 [IU] via SUBCUTANEOUS
  Administered 2014-07-20: 3 [IU] via SUBCUTANEOUS

## 2014-07-17 MED ORDER — WARFARIN SODIUM 7.5 MG PO TABS
7.5000 mg | ORAL_TABLET | Freq: Once | ORAL | Status: AC
  Administered 2014-07-17: 7.5 mg via ORAL
  Filled 2014-07-17: qty 1

## 2014-07-17 NOTE — Progress Notes (Signed)
Noted that diet has been changed to CHO Modified Medium diet.  Recommend changing Novolog insulin times to TID & HS if eating. Will continue to follow while in hospital. Smith MinceKendra Briston Lax RN BSN CDE

## 2014-07-17 NOTE — Progress Notes (Signed)
Speech Language Pathology Treatment: Hillary BowPassy Muir Speaking valve  Patient Details Name: Blake Summers L Schuessler MRN: [id number] DOB: [birth year] Today's Date: 07/17/2014 Time: 1400-1420 SLP Time Calculation (min) (ACUTE ONLY): 20 min  Assessment / Plan / Recommendation Clinical Impression  F/u for education with pt and wife re: Passy Muir valve placement/removal, cleaning, importance of use with PO intake and required removal with sleep.   Reviewed nature of trach, necessity of cuff deflation with PMV placement.  Mrs.Stefano instructed how to place and remove valve - she was able to do so independently.  Pt eating first meal; tolerated well initially; fatigue may impact safety.  Reminded to eat slowly with caution and to follow precautions.  SLP will follow.     HPI HPI: [name] is a 69yo man w/ PMHx of obstructive sleep apnea, obesity hypoventilation syndrome, GERD, Type 2 DM, and chronic hypercarbic respiratory failure on BiPAP at home who presented with worsening SOB for the past week. Found to be in acute on chronic hypercarbic respiratory failure. Pt intubated 1/29 s/p trach 2/2    Pertinent Vitals Pain Assessment: No/denies pain  SLP Plan  Continue with current plan of care    Recommendations Medication Administration: Whole meds with puree Supervision: Intermittent supervision to cue for compensatory strategies Compensations: Slow rate;Small sips/bites (wear PMV with all PO intake) Postural Changes and/or Swallow Maneuvers: Seated upright 90 degrees      Patient may use Passy-Muir Speech Valve: During all waking hours (remove during sleep) PMSV Supervision: Intermittent       Oral Care Recommendations: Oral care BID Follow up Recommendations: Home health SLP Plan: Continue with current plan of care   Annabella Elford L. Samson Fredericouture, [place] CCC/SLP Pager [phone]      Blenda MountsCouture, Amish Mintzer Laurice 07/17/2014, 2:34 PM

## 2014-07-17 NOTE — Progress Notes (Signed)
Patient transferred to 2C02 via wheelchair. Patient positioned comfortably in bed. RN and RT at bedside.

## 2014-07-17 NOTE — Procedures (Signed)
Objective Swallowing Evaluation: Fiberoptic Endoscopic Evaluation of Swallowing  Patient Details  Name: Blake SalenJoseph L Nudd MRN: [id number] Date of Birth: 09-26-1945  Today's Date: 07/17/2014 Time: SLP Start Time (ACUTE ONLY): 1140-SLP Stop Time (ACUTE ONLY): 1220 SLP Time Calculation (min) (ACUTE ONLY): 40 min  Past Medical History:  Past Medical History  Diagnosis Date  . GERD 07/06/2010  . Edema 10/28/2007  . DIABETES MELLITUS, TYPE II 05/04/2007  . HYPERLIPIDEMIA 07/08/2008  . ALLERGIC RHINITIS 07/08/2008  . GLAUCOMA 07/08/2008  . CEREBROVASCULAR ACCIDENT, HX OF 07/08/2008  . OBSTRUCTIVE SLEEP APNEA 03/06/2008  . DIABETIC ULCER, LEFT LEG 08/23/2009  . 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 HYPERTENSION 10/28/2007  . VENOUS INSUFFICIENCY 03/08/2009  . FATTY LIVER DISEASE 05/19/2008  . BENIGN PROSTATIC HYPERTROPHY 07/08/2008  . 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 NEPHROLITHIASIS, HX OF 07/08/2008  . Morbid obesity   . DM nephropathy/sclerosis   . 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 Lumbar spondylosis   . Diarrhea 06/08/2013   Past Surgical History:  Past Surgical History  Procedure Laterality Date  . Lithotripsy  2007  . Tracheostomy tube placement N/A 07/14/2014    Procedure: TRACHEOSTOMY;  Surgeon: Darletta MollSui W Teoh, MD;  [facility];  Service: ENT;  Laterality: N/A;   HPI:  HPI: [name] is a 69yo man w/ PMHx of obstructive sleep apnea, obesity hypoventilation syndrome, GERD, Type 2 DM, and chronic hypercarbic respiratory failure on BiPAP at home who presented with worsening SOB for the past week. Found to be in acute on chronic hypercarbic respiratory failure. Pt intubated 1/29 s/p trach 2/2   No Data Recorded  Assessment / Plan / Recommendation CHL IP CLINICAL IMPRESSIONS 07/17/2014  Dysphagia Diagnosis Mild pharyngeal phase dysphagia  Clinical impression Pt presents with functional oropharyngeal swallow marked only by high penetration of thin liquids and  purees during the swallow; spontaneous subsequent swallows cleared the penetrate.  Pt did not aspirate, even with taxing of the swallowing mechanism with large successive quantities and mixed consistencies.  However, while testing under conditions without and with PMV,  penetration was less likely to occur with valve in place, and pt was able to cough to eject penetrate when necessary.  Recommend allowing a regular consistency diet with thin liquids; meds whole in puree; pt should wear valve when eating.  Wife present for study; pt/wife agree with results and recommendations.        CHL IP TREATMENT RECOMMENDATION 07/17/2014  Treatment Plan Recommendations Therapy as outlined in treatment plan below     CHL IP DIET RECOMMENDATION 07/17/2014  Diet Recommendations Regular;Thin liquid  Liquid Administration via Straw;Cup  Medication Administration Whole meds with puree  Compensations Slow rate;Small sips/bites  Postural Changes and/or Swallow Maneuvers Seated upright 90 degrees     CHL IP OTHER RECOMMENDATIONS 07/17/2014  Recommended Consults (None)  Oral Care Recommendations Oral care BID  Other Recommendations (None)     CHL IP FOLLOW UP RECOMMENDATIONS 07/17/2014  Follow up Recommendations (No Data)     CHL IP FREQUENCY AND DURATION 07/17/2014  Speech Therapy Frequency (ACUTE ONLY) min 2x/week  Treatment Duration 1 week         SLP Swallow Goals No flowsheet data found.  No flowsheet data found.    CHL IP REASON FOR REFERRAL 07/17/2014  Reason for Referral Objectively evaluate swallowing function     CHL IP ORAL PHASE 07/17/2014  Lips (None)  Tongue (None)  Mucous membranes (None)  Nutritional status (None)  Other (None)  Oxygen therapy (None)  Oral Phase WFL  Oral - Pudding Teaspoon (None)  Oral - Pudding Cup (None)  Oral - Honey Teaspoon (None)  Oral - Honey Cup (None)  Oral - Honey Syringe (None)  Oral - Nectar Teaspoon (None)  Oral - Nectar Cup (None)  Oral - Nectar Straw  (None)  Oral - Nectar Syringe (None)  Oral - Ice Chips (None)  Oral - Thin Teaspoon (None)  Oral - Thin Cup (None)  Oral - Thin Straw (None)  Oral - Thin Syringe (None)  Oral - Puree (None)  Oral - Mechanical Soft (None)  Oral - Regular (None)  Oral - Multi-consistency (None)  Oral - Pill (None)  Oral Phase - Comment (None)      CHL IP PHARYNGEAL PHASE 07/17/2014  Pharyngeal Phase Impaired  Pharyngeal - Pudding Teaspoon (None)  Penetration/Aspiration details (pudding teaspoon) (None)  Pharyngeal - Pudding Cup (None)  Penetration/Aspiration details (pudding cup) (None)  Pharyngeal - Honey Teaspoon (None)  Penetration/Aspiration details (honey teaspoon) (None)  Pharyngeal - Honey Cup (None)  Penetration/Aspiration details (honey cup) (None)  Pharyngeal - Honey Syringe (None)  Penetration/Aspiration details (honey syringe) (None)  Pharyngeal - Nectar Teaspoon (None)  Penetration/Aspiration details (nectar teaspoon) (None)  Pharyngeal - Nectar Cup (None)  Penetration/Aspiration details (nectar cup) (None)  Pharyngeal - Nectar Straw (None)  Penetration/Aspiration details (nectar straw) (None)  Pharyngeal - Nectar Syringe (None)  Penetration/Aspiration details (nectar syringe) (None)  Pharyngeal - Ice Chips (None)  Penetration/Aspiration details (ice chips) (None)  Pharyngeal - Thin Teaspoon (None)  Penetration/Aspiration details (thin teaspoon) (None)  Pharyngeal - Thin Cup (None)  Penetration/Aspiration details (thin cup) (None)  Pharyngeal - Thin Straw Penetration/Aspiration during swallow  Penetration/Aspiration details (thin straw) Material enters airway, remains ABOVE vocal cords then ejected out  Pharyngeal - Thin Syringe (None)  Penetration/Aspiration details (thin syringe') (None)  Pharyngeal - Puree Penetration/Aspiration during swallow;Pharyngeal residue - valleculae  Penetration/Aspiration details (puree) Material enters airway, remains ABOVE vocal cords then ejected  out  Pharyngeal - Mechanical Soft (None)  Penetration/Aspiration details (mechanical soft) (None)  Pharyngeal - Regular (None)  Penetration/Aspiration details (regular) (None)  Pharyngeal - Multi-consistency (None)  Penetration/Aspiration details (multi-consistency) (None)  Pharyngeal - Pill (None)  Penetration/Aspiration details (pill) (None)  Pharyngeal Comment (None)     No flowsheet data found.  No flowsheet data found.        Florene Brill L. Samson Frederic, [place] CCC/SLP Pager [phone]  Blenda Mounts Laurice 07/17/2014, 1:53 PM

## 2014-07-17 NOTE — Progress Notes (Signed)
RT Note: Pt placed on vent to rest due to increased WOB, Pt on 80% ATC upon entering room. Pt on previous settings of PRVC 500/ 18/ 50%/ +5.

## 2014-07-17 NOTE — Progress Notes (Signed)
Subjective: No trach issues. Tolerated TC yesterday. Back on vent overnight.  Objective: Vital signs in last 24 hours: Temp:  [98 F (36.7 C)-99.3 F (37.4 C)] 98 F (36.7 C) (02/05 0809) Pulse Rate:  [53-116] 108 (02/05 1100) Resp:  [13-32] 20 (02/05 1100) BP: (101-160)/(55-126) 144/72 mmHg (02/05 1100) SpO2:  [92 %-100 %] 98 % (02/05 1100) FiO2 (%):  [40 %] 40 % (02/05 0900) Weight:  [329 lb 8 oz (149.46 kg)] 329 lb 8 oz (149.46 kg) (02/05 0400)  PHYSICAL EXAMINATION: General: obese, awake, on vent. Neuro: no focal deficits, following commands, eyes open HEENT: [place]/AT [place]/OC: Normal mucosa. No lesion. Neck: Trach midline, in place. Ext: 1-2+ pitting edema in lower extremities Skin: chronic venous skin changes in lower extremities, erythema    Recent Labs  07/16/14 0500 07/17/14 0655  WBC 11.1* 10.3  HGB 12.7* 12.1*  HCT 41.9 40.7  PLT 239 228    Recent Labs  07/15/14 0356 07/17/14 0655  NA 144 151*  K 3.8 3.9  CL 103 109  CO2 34* 35*  GLUCOSE 200* 120*  BUN 15 14  CREATININE 0.69 0.74  CALCIUM 8.8 9.3    Medications:  I have reviewed the patient's current medications. Scheduled: . ampicillin-sulbactam (UNASYN) IV  3 g Intravenous Q8H  . antiseptic oral rinse  7 mL Mouth Rinse QID  . aspirin  81 mg Oral Daily  . brimonidine  1 drop Both Eyes BID  . budesonide  0.5 mg Nebulization BID  . chlorhexidine  15 mL Mouth Rinse BID  . dorzolamide-timolol  1 drop Both Eyes BID  . feeding supplement (PRO-STAT SUGAR FREE 64)  60 mL Per Tube TID  . feeding supplement (VITAL HIGH PROTEIN)  1,000 mL Per Tube Q24H  . insulin aspart  0-20 Units Subcutaneous 6 times per day  . insulin glargine  40 Units Subcutaneous QHS  . ipratropium-albuterol  3 mL Nebulization Q6H  . latanoprost  1 drop Both Eyes QHS  . pantoprazole (PROTONIX) IV  40 mg Intravenous Q24H  . warfarin  7.5 mg Oral ONCE-1800  . Warfarin - Pharmacist Dosing Inpatient   Does not apply q1800    ION:GEXBMWPRN:sodium chloride, albuterol, fentaNYL, hydrALAZINE, metoprolol  Assessment/Plan: POD#3 s/p trach. Doing well, tolerated trach collar yesterday. Due to his difficult airway, will leave fresh trach in place for 2 weeks before changing to uncuff trach tube.   LOS: 7 days   Blake Summers,SUI W 07/17/2014, 11:29 AM

## 2014-07-17 NOTE — Progress Notes (Signed)
ANTICOAGULATION CONSULT NOTE - Follow-up Consult  Pharmacy Consult for Heparin/coumadin Indication: atrial fibrillation  Allergies  Allergen Reactions  . Neosporin [Neomycin-Polymyxin-Gramicidin] Other (See Comments)    unknown    Patient Measurements: Height: 5\' 7"  (170.2 cm) Weight: (!) 329 lb 8 oz (149.46 kg) IBW/kg (Calculated) : 66.1 Heparin Dosing Weight: 104 kg (calculated based on admit weight)  Vital Signs: Temp: 98 F (36.7 C) (02/05 0809) Temp Source: Oral (02/05 0809) BP: 123/80 mmHg (02/05 0900) Pulse Rate: 83 (02/05 0900)  Labs:  Recent Labs  07/15/14 0356 07/15/14 1726 07/16/14 0500 07/17/14 0655  HGB 12.6*  --  12.7* 12.1*  HCT 41.7  --  41.9 40.7  PLT 236  --  239 228  HEPARINUNFRC  --  0.30 0.27* 0.65  CREATININE 0.69  --   --  0.74    Estimated Creatinine Clearance: 122.6 mL/min (by C-G formula based on Cr of 0.74).  Assessment: Pt. Was on IV heparin for new afib. Heparin level 0.65 slightly therapeutic on 2500 units/hr. CBC stable, No bleeding noted per chart. Also to start coumadin today. No baseline INR.  Goal of Therapy:  Heparin level 0.3-0.7 units/ml Monitor platelets by anticoagulation protocol: Yes   Plan:  Continue Heparin 2500 units/hr.   Daily heparin level, PT/INR and CBC.  Coumadin 7.5mg  po x 1 Baseline INR  Bayard HuggerMei Kenley Rettinger, PharmD, BCPS  Clinical Pharmacist  Pager: [phone]  07/17/2014 9:45 AM

## 2014-07-17 NOTE — Progress Notes (Signed)
PULMONARY / CRITICAL CARE MEDICINE   Name: Blake Summers MRN: [id number] DOB: 1945-08-01    ADMISSION DATE:  07/10/2014 CONSULTATION DATE:  07/10/14  REFERRING MD :  Dr. Gonzella Lexhungel  CHIEF COMPLAINT:  Shortness of breath  INITIAL PRESENTATION: Blake Summers is a 69yo man w/ PMHx of obstructive sleep apnea, obesity hypoventilation syndrome, GERD, Type 2 DM, and chronic hypercarbic respiratory failure on BiPAP at home who presented with worsening SOB for the past week. Found to be in acute on chronic hypercarbic respiratory failure.   MAJOR EVENTS/TEST RESULTS: 1/29 CT Angio Chest: No PE. Cardiomegaly, sm R pleural effusion and interstitial thickening likely CHF.  1/29 failed NPPV. Intubated 1/30 Persistent AF. UFH gtt initiated. Screened for MIND study 1/31: copious thick secretions but passed SBT. Extubated and failed several hours later. Re-intubated after failed NPPV attempt. UA looked very raw and edematous on re-intubation. Abx started for presumed PNA  INDWELLING DEVICES:: ETT 1/29 >> trach XL #6 2/2 >>  MICRO DATA: Resp 1/30  >> moraxella Resp 1/31>>oral flora Blood 1/29 >> ng MRSA 1/29>>neg  Flu 1/29 neg  ANTIMICROBIALS:  None   SUBJECTIVE:  Tolerated ATC daytime 2/3 & 2/4 Denies pain    VITAL SIGNS: Temp:  [98 F (36.7 C)-99.3 F (37.4 C)] 98 F (36.7 C) (02/05 0809) Pulse Rate:  [53-116] 116 (02/05 0800) Resp:  [13-32] 20 (02/05 0800) BP: (88-160)/(44-126) 101/86 mmHg (02/05 0800) SpO2:  [92 %-100 %] 100 % (02/05 0800) FiO2 (%):  [40 %] 40 % (02/05 0800) Weight:  [149.46 kg (329 lb 8 oz)] 149.46 kg (329 lb 8 oz) (02/05 0400) HEMODYNAMICS:   Vent Mode:  [-] PRVC FiO2 (%):  [40 %] 40 % Set Rate:  [18 bmp] 18 bmp Vt Set:  [500 mL] 500 mL PEEP:  [5 cmH20] 5 cmH20 Plateau Pressure:  [24 cmH20-28 cmH20] 24 cmH20 INTAKE / OUTPUT:  Intake/Output Summary (Last 24 hours) at 07/17/14 0902 Last data filed at 07/17/14 0800  Gross per 24 hour  Intake 1304.83 ml   Output    600 ml  Net 704.83 ml    PHYSICAL EXAMINATION: General: obese, plethoric, NAD Neuro: no focal deficits, moving all 4 ext, following commands, eyes open HEENT: Blake Summers, tstomy XL #6 Cardiovascular: irreg , no murmurs  Lungs: course breath sounds b/l, decreased BL Abdomen: obese, edematous Ext: 1-2+ pitting edema in lower extremities Skin: chronic venous skin changes in lower extremities, erythema   LABS: CMP Latest Ref Rng 07/17/2014 07/15/2014 07/14/2014  Glucose 70 - 99 mg/dL 086(V120(H) 784(O200(H) 962(X250(H)  BUN 6 - 23 mg/dL 14 15 18   Creatinine 0.50 - 1.35 mg/dL 5.280.74 4.130.69 2.440.64  Sodium 135 - 145 mmol/L 151(H) 144 142  Potassium 3.5 - 5.1 mmol/L 3.9 3.8 3.8  Chloride 96 - 112 mmol/L 109 103 102  CO2 19 - 32 mmol/L 35(H) 34(H) 35(H)  Calcium 8.4 - 10.5 mg/dL 9.3 8.8 8.7  Total Protein 6.0 - 8.3 g/dL - - -  Total Bilirubin 0.3 - 1.2 mg/dL - - -  Alkaline Phos 39 - 117 U/L - - -  AST 0 - 37 U/L - - -  ALT 0 - 53 U/L - - -   CBC Latest Ref Rng 07/17/2014 07/16/2014 07/15/2014  WBC 4.0 - 10.5 K/uL 10.3 11.1(H) 9.9  Hemoglobin 13.0 - 17.0 g/dL 12.1(L) 12.7(L) 12.6(L)  Hematocrit 39.0 - 52.0 % 40.7 41.9 41.7  Platelets 150 - 400 K/uL 228 239 236    CXR: cardiomegaly  ASSESSMENT /  PLAN:  PULMONARY A:  ETT 1/29>> 1/31, 1/31 >>  Acute on chronic hypercarbic respiratory failure OSA/OHS Mucous plugging resolving per CXR 1/31  Copious thick tan blood tinged secretions from ETT ?tracheal bronchitis  Mod L pleural effusion (decreased) L chest white out - likely atx -resolved Remote smoking P:   Trach collar as tolerated x 24h, unclear if he will need noct ventilation Prn Alb, Pulmicort Duoneb q6    CARDIOVASCULAR A:  H/O HTN, HLD AF rate controlled, CVR - unclear duration Echocardiogram  > poor study, no good windows P:  Heparin gtt  - start coumadin  . Aspirin 81 per tube. Cont hydralazine prn q4 SBP >170 Cont metoprolol prn 2.5-5 mg q3 prn HR >115   RENAL A:   Hypokalemia  , hypophos- repleted Hypernatremia  P:  Add free water Monitor BMET (replete electrolytes prn)   Goal K >4, Mag >2  Monitor I/Os, daily wts    GASTROINTESTINAL A:   GERD, chronic PPI use NPO with TF  P:   Protonix 40 mg iv qd   HEMATOLOGIC A:   Normocytic anemia Hbg stable  P:  DVT px: full dose heparin for AF Monitor CBC intermittently Transfuse per usual ICU guidelines   INFECTIOUS A:  Leukocytosis ?tracheal bronchitis -moraxella Abx Rocephin 1/29>>1/29 1 dose, AZM 1/29>>1/29 1 dose Unasyn 1/31>>2/5 P:   Micro and abx as above Ct Unasyn until 2/5  ENDOCRINE A:   Type 2 DM with hyperglycemia (HA1C 8.4 05/27/14)  Low tsh ? Sick euthyroid; free T4 normal P:   Increase Lantus 40 Cont SSI-R   NEUROLOGIC A:   Acute encephalopathy, fully resolved Deconditioning  P:   PT/OT  PM valve -speech eval Swallow eval    FAMILY  - Updates: Updated pt, son, wife on 2/1   Plan for LTAC vs vent SNF - unclear if he will need nocturnal ventilation Transfer to vent SDU  Care during the described time interval was provided by me and/or other providers on the critical care team.  I have reviewed this patient's available data, including medical history, events of note, physical examination and test results as part of my evaluation  CC time x 69m  Blake Summers V. MD  07/17/2014, 9:02 AM

## 2014-07-17 NOTE — Progress Notes (Signed)
Received pt from 2S at 2045.  Pt was SOB with the transfer but is now resting comfortably.

## 2014-07-17 NOTE — Evaluation (Signed)
Occupational Therapy Evaluation Patient Details Name: Blake Summers MRN: [id number] DOB: 1945/12/27 Today's Date: 07/17/2014    History of Present Illness [name] is a 69yo man w/ PMHx of obstructive sleep apnea, obesity hypoventilation syndrome, GERD, Type 2 DM, and chronic hypercarbic respiratory failure on BiPAP at home who presented with worsening SOB for the past week. Found to be in acute on chronic hypercarbic respiratory failure. Pt intubated 1/29 s/p trach 2/2   Clinical Impression   Pt was assisted for showering, dressing, and pericare prior to admission.  He primarily used a w/c for mobility, but he could walk into the bathroom with a RW to toilet and shower.  Pt has chronic knee and back pain. Pt requesting upper body exercise program he can do on his own. Pt will need to be at supervision level in mobility and ADL transfers for a home discharge. Will follow acutely.  Follow Up Recommendations  No OT follow up    Equipment Recommendations  None recommended by OT    Recommendations for Other Services       Precautions / Restrictions Precautions Precautions: Fall Precaution Comments: trach Restrictions Weight Bearing Restrictions: No      Mobility Bed Mobility               General bed mobility comments: pt in chair on arrival  Transfers Overall transfer level: Needs assistance     Sit to Stand: Min guard              Balance     Sitting balance-Leahy Scale: Fair       Standing balance-Leahy Scale: Poor                              ADL Overall ADL's : Needs assistance/impaired Eating/Feeding: Independent;Sitting   Grooming: Wash/dry hands;Wash/dry face;Oral care;Sitting;Set up                       Toileting- Clothing Manipulation and Hygiene: Total assistance         General ADL Comments: Did not assess bathing and dressing as pt is dependent at baseline.     Vision                     Perception      Praxis      Pertinent Vitals/Pain Pain Assessment: No/denies pain     Hand Dominance Right   Extremity/Trunk Assessment Upper Extremity Assessment Upper Extremity Assessment: Generalized weakness   Lower Extremity Assessment Lower Extremity Assessment: Defer to PT evaluation       Communication Communication Communication: Tracheostomy;Passy-Muir valve   Cognition Arousal/Alertness: Awake/alert Behavior During Therapy: WFL for tasks assessed/performed Overall Cognitive Status: Within Functional Limits for tasks assessed                     General Comments       Exercises       Shoulder Instructions      Home Living Family/patient expects to be discharged to:: Private residence Living Arrangements: Spouse/significant other Available Help at Discharge: Family;Available 24 hours/day Type of Home: House Home Access: Ramped entrance     Home Layout: One level     Bathroom Shower/Tub: Producer, television/film/videoWalk-in shower   Bathroom Toilet: Standard     Home Equipment: Environmental consultantWalker - 2 wheels;Shower seat;Bedside commode;Wheelchair - manual;Other (comment) (lift chair)  Prior Functioning/Environment Level of Independence: Needs assistance  Gait / Transfers Assistance Needed: pt states he could get OOB to [facility] and walk max 10' with RW into bathroom ADL's / Homemaking Assistance Needed: wife assists with bathing and dressing         OT Diagnosis: Generalized weakness   OT Problem List: Decreased strength;Decreased activity tolerance;Impaired balance (sitting and/or standing);Obesity;Decreased knowledge of use of DME or AE;Cardiopulmonary status limiting activity   OT Treatment/Interventions: Self-care/ADL training;Therapeutic exercise;DME and/or AE instruction;Therapeutic activities;Patient/family education    OT Goals(Current goals can be found in the care plan section) Acute Rehab OT Goals Patient Stated Goal: return home with wife's assist OT Goal Formulation:  With patient Time For Goal Achievement: 07/31/14 Potential to Achieve Goals: Good ADL Goals Pt Will Transfer to Toilet: with supervision;ambulating Pt Will Perform Tub/Shower Transfer: Shower transfer;with supervision;rolling walker;ambulating;shower seat Additional ADL Goal #1: Pt will be independent in UE exercise program with level 2 theraband.  OT Frequency: Min 2X/week   Barriers to D/C:            Co-evaluation              End of Session Equipment Utilized During Treatment: Oxygen  Activity Tolerance: Patient limited by fatigue;Treatment limited secondary to medical complications (Comment) (afib) Patient left: in chair;with call bell/phone within reach   Time: 1610-9604 OT Time Calculation (min): 17 min Charges:  OT General Charges $OT Visit: 1 Procedure OT Evaluation $Initial OT Evaluation Tier I: 1 Procedure G-Codes:    Evern Bio 07/17/2014, 3:42 PM  [phone]

## 2014-07-18 LAB — BASIC METABOLIC PANEL
Anion gap: 7 (ref 5–15)
BUN: 17 mg/dL (ref 6–23)
CHLORIDE: 104 mmol/L (ref 96–112)
CO2: 33 mmol/L — ABNORMAL HIGH (ref 19–32)
CREATININE: 0.7 mg/dL (ref 0.50–1.35)
Calcium: 9 mg/dL (ref 8.4–10.5)
GFR calc Af Amer: >90 mL/min (ref >90)
GFR calc non Af Amer: >90 mL/min (ref >90)
Glucose, Bld: 223 mg/dL — ABNORMAL HIGH (ref 70–99)
POTASSIUM: 4.2 mmol/L (ref 3.5–5.1)
Sodium: 144 mmol/L (ref 135–145)

## 2014-07-18 LAB — CBC
HCT: 44 % (ref 39.0–52.0)
Hemoglobin: 13 g/dL (ref 13.0–17.0)
MCH: 28.1 pg (ref 26.0–34.0)
MCHC: 29.5 g/dL — ABNORMAL LOW (ref 30.0–36.0)
MCV: 95 fL (ref 78.0–100.0)
Platelets: 286 10*3/uL (ref 150–400)
RBC: 4.63 MIL/uL (ref 4.22–5.81)
RDW: 16.1 % — AB (ref 11.5–15.5)
WBC: 11.4 10*3/uL — ABNORMAL HIGH (ref 4.0–10.5)

## 2014-07-18 LAB — BLOOD GAS, ARTERIAL
Acid-Base Excess: 5.5 mmol/L — ABNORMAL HIGH (ref 0.0–2.0)
Bicarbonate: 34 mEq/L — ABNORMAL HIGH (ref 20.0–24.0)
Drawn by: 41881
FIO2: 0.4 %
O2 Saturation: 97.7 %
PATIENT TEMPERATURE: 98.6
PH ART: 7.149 — AB (ref 7.350–7.450)
PO2 ART: 137 mmHg — AB (ref 80.0–100.0)
TCO2: 37.2 mmol/L (ref 0–100)
pCO2 arterial: 102 mmHg (ref 35.0–45.0)

## 2014-07-18 LAB — GLUCOSE, CAPILLARY
GLUCOSE-CAPILLARY: 183 mg/dL — AB (ref 70–99)
Glucose-Capillary: 196 mg/dL — ABNORMAL HIGH (ref 70–99)
Glucose-Capillary: 181 mg/dL — ABNORMAL HIGH (ref 70–99)
Glucose-Capillary: 168 mg/dL — ABNORMAL HIGH (ref 70–99)
Glucose-Capillary: 189 mg/dL — ABNORMAL HIGH (ref 70–99)

## 2014-07-18 LAB — PROTIME-INR
INR: 1.11 (ref 0.00–1.49)
Prothrombin Time: 14.4 seconds (ref 11.6–15.2)

## 2014-07-18 LAB — HEPARIN LEVEL (UNFRACTIONATED): HEPARIN UNFRACTIONATED: 0.59 [IU]/mL (ref 0.30–0.70)

## 2014-07-18 MED ORDER — WARFARIN VIDEO: Freq: Once | Status: DC

## 2014-07-18 MED ORDER — WARFARIN SODIUM 7.5 MG PO TABS
7.5000 mg | ORAL_TABLET | Freq: Once | ORAL | Status: AC
  Administered 2014-07-18: 7.5 mg via ORAL
  Filled 2014-07-18: qty 1

## 2014-07-18 NOTE — Progress Notes (Addendum)
Patient placed back on trach collar per patient request.  Rt is in room and monitoring vitals.  Patient on 40% trach collar, SPO2-93, HR-108.  RT will continue to monitor.

## 2014-07-18 NOTE — Progress Notes (Signed)
Subjective: Pt back on vent due to O2 desat. No trach issues.  Objective: Vital signs in last 24 hours: Temp:  [97.9 F (36.6 C)-98.4 F (36.9 C)] 98.4 F (36.9 C) (02/06 1140) Pulse Rate:  [90-123] 116 (02/06 1140) Resp:  [11-41] 27 (02/06 1140) BP: (113-157)/(62-101) 135/63 mmHg (02/06 0736) SpO2:  [72 %-100 %] 100 % (02/06 1417) FiO2 (%):  [40 %-60 %] 50 % (02/06 1417) Weight:  [335 lb (151.955 kg)] 335 lb (151.955 kg) (02/05 2103)  PHYSICAL EXAMINATION: General: obese, awake, on vent. Neuro: no focal deficits, following commands, eyes open HEENT: [place]/AT [place]/OC: Normal mucosa. No lesion. Neck: Trach midline, in place. Ext: 1-2+ pitting edema in lower extremities Skin: chronic venous skin changes in lower extremities, erythema    Recent Labs  07/17/14 0655 07/18/14 0309  WBC 10.3 11.4*  HGB 12.1* 13.0  HCT 40.7 44.0  PLT 228 286    Recent Labs  07/17/14 0655 07/18/14 0309  NA 151* 144  K 3.9 4.2  CL 109 104  CO2 35* 33*  GLUCOSE 120* 223*  BUN 14 17  CREATININE 0.74 0.70  CALCIUM 9.3 9.0    Medications:  I have reviewed the patient's current medications. Scheduled: . antiseptic oral rinse  7 mL Mouth Rinse QID  . aspirin  81 mg Oral Daily  . brimonidine  1 drop Both Eyes BID  . budesonide  0.5 mg Nebulization BID  . chlorhexidine  15 mL Mouth Rinse BID  . dorzolamide-timolol  1 drop Both Eyes BID  . insulin aspart  0-15 Units Subcutaneous TID WC  . insulin glargine  40 Units Subcutaneous QHS  . ipratropium-albuterol  3 mL Nebulization Q6H  . latanoprost  1 drop Both Eyes QHS  . pantoprazole (PROTONIX) IV  40 mg Intravenous Q24H  . warfarin  7.5 mg Oral ONCE-1800  . warfarin   Does not apply Once  . Warfarin - Pharmacist Dosing Inpatient   Does not apply q1800   QMV:HQIONGPRN:sodium chloride, albuterol, fentaNYL, hydrALAZINE, metoprolol, oxyCODONE-acetaminophen  Assessment/Plan: POD#4 s/p trach. Doing well, tolerated trach collar yesterday. Now back on  vent. Due to his difficult airway, will leave fresh trach in place for 2 weeks before changing to uncuff trach tube.   LOS: 8 days   Blake Summers,SUI W 07/18/2014, 2:39 PM

## 2014-07-18 NOTE — Progress Notes (Signed)
eLink Physician-Brief Progress Note Patient Name: Blake SalenJoseph L Summers DOB: 02-14-1946 MRN: [id number]   Date of Service  07/18/2014  HPI/Events of Note  Call from nurse that patient is now non-verbal where as prior to the AM was verbalizing.  He did experience acute encephalopathy during this hospitalization but according to notes this was fully resolved.  Now follows commands but not verbal.  Blood sugar is 196 and ABG is 7.14/102/137/34  - to be placed back on vent  eICU Interventions  Plan; Vent Recheck ABG later this afternoon        DETERDING,ELIZABETH 07/18/2014, 6:28 AM

## 2014-07-18 NOTE — Progress Notes (Signed)
RT placed patient back on vent due to patients sats dropping. Patient is currently on PRVC, 50%, PEEP 5, set rate 18, and Vt 500. Patients sats are 94. Will continue to monitor.

## 2014-07-18 NOTE — Progress Notes (Signed)
ANTICOAGULATION CONSULT NOTE - Follow-up Consult  Pharmacy Consult for Heparin/coumadin Indication: atrial fibrillation  Allergies  Allergen Reactions  . Neosporin [Neomycin-Polymyxin-Gramicidin] Other (See Comments)    unknown    Patient Measurements: Height: 5\' 7"  (170.2 cm) Weight: (!) 335 lb (151.955 kg) IBW/kg (Calculated) : 66.1 Heparin Dosing Weight: 104 kg (calculated based on admit weight)  Vital Signs: Temp: 98.4 F (36.9 C) (02/06 0736) Temp Source: Oral (02/06 0736) BP: 135/63 mmHg (02/06 0736) Pulse Rate: 106 (02/06 0736)  Labs:  Recent Labs  07/16/14 0500 07/17/14 0655 07/17/14 1027 07/18/14 0309  HGB 12.7* 12.1*  --  13.0  HCT 41.9 40.7  --  44.0  PLT 239 228  --  286  LABPROT  --   --  15.6* 14.4  INR  --   --  1.23 1.11  HEPARINUNFRC 0.27* 0.65  --  0.59  CREATININE  --  0.74  --  0.70    Estimated Creatinine Clearance: 123.9 mL/min (by C-G formula based on Cr of 0.7).  Assessment: 69yo male on IV heparin for new afib. Heparin level therapeutic at 0.59 today on 2500 units/hr. CBC stable, No bleeding noted. Started coumadin yesterday. INR 1.11 today.  Goal of Therapy:  INR 2-3 Heparin level 0.3-0.7 units/ml Monitor platelets by anticoagulation protocol: Yes   Plan:  Continue Heparin 2500 units/hr Coumadin 7.5mg  po x 1 Monitor daily heparin level, INR, CBC, s/sx of bleeding  Waynette Butteryegan K. Rhett Najera, PharmD Clinical Pharmacy Resident Pager: [phone] 07/18/2014 10:24 AM

## 2014-07-18 NOTE — Progress Notes (Signed)
PULMONARY / CRITICAL CARE MEDICINE   Name: Blake Summers MRN: [id number] DOB: [birth year]    ADMISSION DATE:  07/10/2014 CONSULTATION DATE:  07/10/14  REFERRING MD :  Dr. Gonzella Lex  CHIEF COMPLAINT:  Shortness of breath  INITIAL PRESENTATION: Blake Summers is a 69yo man w/ PMHx of obstructive sleep apnea, obesity hypoventilation syndrome, GERD, Type 2 DM, and chronic hypercarbic respiratory failure on BiPAP at home who presented with worsening SOB for the past week. Found to be in acute on chronic hypercarbic respiratory failure.   MAJOR EVENTS/TEST RESULTS: 1/29 CT Angio Chest: No PE. Cardiomegaly, sm R pleural effusion and interstitial thickening likely CHF.  1/29 failed NPPV. Intubated 1/30 Persistent AF. UFH gtt initiated. Screened for MIND study 1/31: copious thick secretions but passed SBT. Extubated and failed several hours later. Re-intubated after failed NPPV attempt. UA looked very raw and edematous on re-intubation. Abx started for presumed PNA  INDWELLING DEVICES:: ETT 1/29 >> trach XL #6 2/2 >>  MICRO DATA: Resp 1/30  >> moraxella Resp 1/31>>oral flora Blood 1/29 >> ng MRSA 1/29>>neg  Flu 1/29 neg  ANTIMICROBIALS:  UnaSyn 1/31>>2/6   SUBJECTIVE:  Tolerated ATC daytime 2/3 & 2/4, became non-verbal on trach collar w resp acidosis pH 7.14. Back on vent to rest. Now back on Tcollar, denying discomfort. Denies pain   VITAL SIGNS: Temp:  [97.3 F (36.3 C)-98.4 F (36.9 C)] 98.4 F (36.9 C) (02/06 0736) Pulse Rate:  [90-123] 106 (02/06 0736) Resp:  [11-41] 24 (02/06 0736) BP: (113-160)/(62-101) 135/63 mmHg (02/06 0736) SpO2:  [72 %-100 %] 98 % (02/06 0736) FiO2 (%):  [40 %-60 %] 50 % (02/06 0736) Weight:  [151.955 kg (335 lb)] 151.955 kg (335 lb) (02/05 2103) HEMODYNAMICS:   Vent Mode:  [-] PRVC FiO2 (%):  [40 %-60 %] 50 % Set Rate:  [18 bmp] 18 bmp Vt Set:  [500 mL] 500 mL PEEP:  [5 cmH20] 5 cmH20 Plateau Pressure:  [26 cmH20-28 cmH20] 26 cmH20 INTAKE /  OUTPUT:  Intake/Output Summary (Last 24 hours) at 07/18/14 1114 Last data filed at 07/18/14 0600  Gross per 24 hour  Intake   2165 ml  Output      4 ml  Net   2161 ml    PHYSICAL EXAMINATION: General: obese, plethoric, NAD Neuro: no focal deficits, moving all 4 ext, following commands, eyes open, vocal w PMV/ T collar HEENT: [place]/AT, tstomy XL #6 Cardiovascular: irreg , no murmurs  Lungs: course breath sounds b/l, decreased BL Abdomen: obese, edematous Ext: 1-2+ pitting edema in lower extremities Skin: chronic venous skin changes in lower extremities, erythema   LABS: CMP Latest Ref Rng 07/18/2014 07/17/2014 07/15/2014  Glucose 70 - 99 mg/dL 213(Y) 865(H) 846(N)  BUN 6 - 23 mg/dL Creatinine 0.50 - 1.35 mg/dL 6.29 5.28 4.13  Sodium 135 - 145 mmol/L 144 151(H) 144  Potassium 3.5 - 5.1 mmol/L 4.2 3.9 3.8  Chloride 96 - 112 mmol/L 104 109 103  CO2 19 - 32 mmol/L 33(H) 35(H) 34(H)  Calcium 8.4 - 10.5 mg/dL 9.0 9.3 8.8  Total Protein 6.0 - 8.3 g/dL - - -  Total Bilirubin 0.3 - 1.2 mg/dL - - -  Alkaline Phos 39 - 117 U/L - - -  AST 0 - 37 U/L - - -  ALT 0 - 53 U/L - - -   CBC Latest Ref Rng 07/18/2014 07/17/2014 07/16/2014  WBC 4.0 - 10.5 K/uL 11.4(H) 10.3 11.1(H)  Hemoglobin 13.0 -  17.0 g/dL 16.113.0 12.1(L) 12.7(L)  Hematocrit 39.0 - 52.0 % 44.0 40.7 41.9  Platelets 150 - 400 K/uL 286 228 239    CXR: cardiomegaly  ASSESSMENT / PLAN:  PULMONARY A:  ETT 1/29>> 1/31, 1/31 >>  Acute on chronic hypercarbic respiratory failure OSA/OHS  Bibasilar atx or infiltrates as of 2/1 Remote smoking P:   Trach collar as tolerated x 24h, unclear if he will need noct ventilation Prn Alb, Pulmicort Duoneb q6  -CXR   CARDIOVASCULAR A:  H/O HTN, HLD AF rate controlled, CVR - unclear duration Echocardiogram  > poor study, no good windows P:  Heparin gtt  - start coumadin  . Aspirin 81 per tube. Cont hydralazine prn q4 SBP >170 Cont metoprolol prn 2.5-5 mg q3 prn HR  >115   RENAL A:   Hypokalemia , hypophos- repleted Hypernatremia  P:  Add free water Monitor BMET (replete electrolytes prn)   Goal K >4, Mag >2  Monitor I/Os, daily wts    GASTROINTESTINAL A:   GERD, chronic PPI use NPO with TF  P:   Protonix 40 mg iv qd   HEMATOLOGIC A:   Normocytic anemia Hbg stable  P:  DVT px: full dose heparin for AF Monitor CBC intermittently Transfuse per usual ICU guidelines   INFECTIOUS A:  Leukocytosis ?tracheal bronchitis -moraxella Abx Rocephin 1/29>>1/29 1 dose, AZM 1/29>>1/29 1 dose Unasyn 1/31>>2/6 P:   Micro and abx as above Unasyn dc'Summers 2/6  ENDOCRINE A:   Type 2 DM with hyperglycemia (HA1C 8.4 05/27/14)  Low tsh ? Sick euthyroid; free T4 normal P:   Increase Lantus 40 Cont SSI-R   NEUROLOGIC A:   Acute encephalopathy, fully resolved Deconditioning  P:   PT/OT  PM valve -speech eval Swallow eval    FAMILY  - Updates: Updated pt, son, wife on 2/1   Plan for LTAC vs vent SNF - unclear if he will need nocturnal ventilation. Did not tolerate continuous Tcollar overnight 2/5, requiring vent rest AM 2/6 Transfer to vent SDU    Blake DuhamelYOUNG,Reon Hunley D MD  07/18/2014, 11:14 AM

## 2014-07-18 NOTE — Progress Notes (Signed)
Pt was asking for pain medication for his back and when I arrived to give him medication he was hunched over, very red in the face, and not verbally responsive.  Pt followed commands x4 extremities but could not respond to verbal commands.  Elink was called and camera'd in, CBG was 196, RT collected a blood gas and results were ph 7.14, CO2 102, po2 137, and bicarb 34.  Pt was promptly put back on the vent by RT and put back in bed. Pt is now following verbal commands and states he is feeling much better.  RN will continue to monitor.

## 2014-07-19 DIAGNOSIS — E11622 Type 2 diabetes mellitus with other skin ulcer: Secondary | ICD-10-CM

## 2014-07-19 DIAGNOSIS — L97819 Non-pressure chronic ulcer of other part of right lower leg with unspecified severity: Secondary | ICD-10-CM

## 2014-07-19 DIAGNOSIS — L97829 Non-pressure chronic ulcer of other part of left lower leg with unspecified severity: Secondary | ICD-10-CM

## 2014-07-19 LAB — GLUCOSE, CAPILLARY
GLUCOSE-CAPILLARY: 158 mg/dL — AB (ref 70–99)
Glucose-Capillary: 182 mg/dL — ABNORMAL HIGH (ref 70–99)
Glucose-Capillary: 151 mg/dL — ABNORMAL HIGH (ref 70–99)
Glucose-Capillary: 186 mg/dL — ABNORMAL HIGH (ref 70–99)

## 2014-07-19 LAB — CBC
HEMATOCRIT: 42.6 % (ref 39.0–52.0)
Hemoglobin: 12.7 g/dL — ABNORMAL LOW (ref 13.0–17.0)
MCH: 28.1 pg (ref 26.0–34.0)
MCHC: 29.8 g/dL — AB (ref 30.0–36.0)
MCV: 94.2 fL (ref 78.0–100.0)
PLATELETS: 246 10*3/uL (ref 150–400)
RBC: 4.52 MIL/uL (ref 4.22–5.81)
RDW: 15.8 % — ABNORMAL HIGH (ref 11.5–15.5)
WBC: 9 10*3/uL (ref 4.0–10.5)

## 2014-07-19 LAB — PROTIME-INR
INR: 1.29 (ref 0.00–1.49)
PROTHROMBIN TIME: 16.3 s — AB (ref 11.6–15.2)

## 2014-07-19 LAB — HEPARIN LEVEL (UNFRACTIONATED): Heparin Unfractionated: 0.46 IU/mL (ref 0.30–0.70)

## 2014-07-19 MED ORDER — PATIENT'S GUIDE TO USING COUMADIN BOOK
Freq: Once | Status: AC
  Administered 2014-07-19: 1
  Filled 2014-07-19: qty 1

## 2014-07-19 MED ORDER — WARFARIN SODIUM 10 MG PO TABS
10.0000 mg | ORAL_TABLET | Freq: Once | ORAL | Status: AC
  Administered 2014-07-19: 10 mg via ORAL
  Filled 2014-07-19: qty 1

## 2014-07-19 MED ORDER — PANTOPRAZOLE SODIUM 40 MG PO TBEC
40.0000 mg | DELAYED_RELEASE_TABLET | ORAL | Status: DC
  Administered 2014-07-22: 40 mg via ORAL
  Filled 2014-07-19 (×2): qty 1

## 2014-07-19 NOTE — Progress Notes (Signed)
Placed pt on 80% trach collar so he could eat

## 2014-07-19 NOTE — Progress Notes (Signed)
ANTICOAGULATION CONSULT NOTE - Follow-up Consult  Pharmacy Consult for Heparin/coumadin Indication: atrial fibrillation  Allergies  Allergen Reactions  . Neosporin [Neomycin-Polymyxin-Gramicidin] Other (See Comments)    unknown    Patient Measurements: Height: 5\' 7"  (170.2 cm) Weight: (!) 326 lb (147.873 kg) IBW/kg (Calculated) : 66.1 Heparin Dosing Weight: 104 kg (calculated based on admit weight)  Vital Signs: Temp: 99.1 F (37.3 C) (02/07 0725) Temp Source: Axillary (02/07 0725) BP: 158/89 mmHg (02/07 0725) Pulse Rate: 93 (02/07 0725)  Labs:  Recent Labs  07/17/14 0655 07/17/14 1027 07/18/14 0309 07/19/14 0355 07/19/14 0358  HGB 12.1*  --  13.0 12.7*  --   HCT 40.7  --  44.0 42.6  --   PLT 228  --  286 246  --   LABPROT  --  15.6* 14.4 16.3*  --   INR  --  1.23 1.11 1.29  --   HEPARINUNFRC 0.65  --  0.59  --  0.46  CREATININE 0.74  --  0.70  --   --     Estimated Creatinine Clearance: 121.8 mL/min (by C-G formula based on Cr of 0.7).  Assessment: 69yo male on IV heparin for new afib. Heparin level remains therapeutic at 0.46 today on 2500 units/hr. CBC stable, No bleeding noted. Started coumadin 2/5. INR 1.29 today.  Goal of Therapy:  INR 2-3 Heparin level 0.3-0.7 units/ml Monitor platelets by anticoagulation protocol: Yes   Plan:  Continue Heparin 2500 units/hr Coumadin 10mg  po x 1 Monitor daily heparin level, INR, CBC, s/sx of bleeding  Waynette Butteryegan K. Jakory Matsuo, PharmD Clinical Pharmacy Resident Pager: [phone] 07/19/2014 8:49 AM

## 2014-07-19 NOTE — Progress Notes (Signed)
PULMONARY / CRITICAL CARE MEDICINE   Name: LAVANTE TOSO MRN: [id number] DOB: [birth year]    ADMISSION DATE:  07/10/2014 CONSULTATION DATE:  07/10/14  REFERRING MD :  Dr. Gonzella Lex  CHIEF COMPLAINT:  Shortness of breath  INITIAL PRESENTATION: Mr. Mroczkowski is a 69yo man w/ PMHx of obstructive sleep apnea, obesity hypoventilation syndrome, GERD, Type 2 DM, and chronic hypercarbic respiratory failure on BiPAP at home who presented with worsening SOB for the past week. Found to be in acute on chronic hypercarbic respiratory failure.   MAJOR EVENTS/TEST RESULTS: 1/29 CT Angio Chest: No PE. Cardiomegaly, sm R pleural effusion and interstitial thickening likely CHF.  1/29 failed NPPV. Intubated 1/30 Persistent AF. UFH gtt initiated. Screened for MIND study 1/31: copious thick secretions but passed SBT. Extubated and failed several hours later. Re-intubated after failed NPPV attempt. UA looked very raw and edematous on re-intubation. Abx started for presumed PNA  INDWELLING DEVICES:: ETT 1/29 >> trach XL #6 2/2 >>  MICRO DATA: Resp 1/30  >> moraxella Resp 1/31>>oral flora Blood 1/29 >> ng MRSA 1/29>>neg  Flu 1/29 neg  ANTIMICROBIALS:  UnaSyn 1/31>>2/6   SUBJECTIVE:  Asleep now, back on vent. Dr Avel Sensor note appreciated   VITAL SIGNS: Temp:  [98.2 F (36.8 C)-99.1 F (37.3 C)] 99.1 F (37.3 C) (02/07 0725) Pulse Rate:  [85-116] 93 (02/07 0725) Resp:  [18-31] 20 (02/07 0725) BP: (146-175)/(70-89) 158/89 mmHg (02/07 0725) SpO2:  [83 %-100 %] 98 % (02/07 0736) FiO2 (%):  [50 %-60 %] 50 % (02/07 0736) Weight:  [147.873 kg (326 lb)] 147.873 kg (326 lb) (02/07 0551) HEMODYNAMICS:   Vent Mode:  [-] PRVC FiO2 (%):  [50 %-60 %] 50 % Set Rate:  [18 bmp] 18 bmp Vt Set:  [500 mL] 500 mL PEEP:  [5 cmH20] 5 cmH20 Plateau Pressure:  [16 cmH20-27 cmH20] 27 cmH20 INTAKE / OUTPUT:  Intake/Output Summary (Last 24 hours) at 07/19/14 0954 Last data filed at 07/19/14 0800  Gross per 24 hour   Intake   1050 ml  Output   1050 ml  Net      0 ml    PHYSICAL EXAMINATION: General: obese, plethoric, NAD Neuro: no focal deficits, moving all 4 ext, following commands, eyes open, vocal w PMV/ T collar HEENT: [place]/AT, tstomy XL #6 Cardiovascular: irreg , no murmurs  Lungs: quiet breath sounds b/l, decreased BL, unlabored on vent Abdomen: obese, edematous Ext: 1-2+ pitting edema in lower extremities Skin: chronic venous skin changes in lower extremities, erythema   LABS: CMP Latest Ref Rng 07/18/2014 07/17/2014 07/15/2014  Glucose 70 - 99 mg/dL 119(J) 478(G) 956(O)  BUN 6 - 23 mg/dL Creatinine 0.50 - 1.35 mg/dL 1.30 8.65 7.84  Sodium 135 - 145 mmol/L 144 151(H) 144  Potassium 3.5 - 5.1 mmol/L 4.2 3.9 3.8  Chloride 96 - 112 mmol/L 104 109 103  CO2 19 - 32 mmol/L 33(H) 35(H) 34(H)  Calcium 8.4 - 10.5 mg/dL 9.0 9.3 8.8  Total Protein 6.0 - 8.3 g/dL - - -  Total Bilirubin 0.3 - 1.2 mg/dL - - -  Alkaline Phos 39 - 117 U/L - - -  AST 0 - 37 U/L - - -  ALT 0 - 53 U/L - - -   CBC Latest Ref Rng 07/19/2014 07/18/2014 07/17/2014  WBC 4.0 - 10.5 K/uL 9.0 11.4(H) 10.3  Hemoglobin 13.0 - 17.0 g/dL 12.7(L) 13.0 12.1(L)  Hematocrit 39.0 - 52.0 % 42.6 44.0 40.7  Platelets 150 -  400 K/uL 246 286 228    CXR: cardiomegaly  ASSESSMENT / PLAN:  PULMONARY A:  ETT 1/29>> 1/31, 1/31 >>  Acute on chronic hypercarbic respiratory failure OSA/OHS  Bibasilar atx or infiltrates as of 2/1 Remote smoking P:   Trach collar as tolerated x 24h. He is not tolerating 24 hr ATC Prn Alb, Pulmicort Duoneb q6  -CXR   CARDIOVASCULAR A:  H/O HTN, HLD AF rate controlled, CVR - unclear duration Echocardiogram  > poor study, no good windows P:  Heparin gtt  - start coumadin  . Aspirin 81 per tube. Cont hydralazine prn q4 SBP >170 Cont metoprolol prn 2.5-5 mg q3 prn HR >115   RENAL A:   Hypokalemia , hypophos- repleted Hypernatremia  P:  Add free water Monitor BMET (replete electrolytes prn)    Goal K >4, Mag >2  Monitor I/Os, daily wts    GASTROINTESTINAL A:   GERD, chronic PPI use NPO with TF  P:   Protonix 40 mg iv qd   HEMATOLOGIC A:   Normocytic anemia Hbg stable  P:  DVT px: full dose heparin for AF Monitor CBC intermittently Transfuse per usual ICU guidelines   INFECTIOUS A:  Leukocytosis ?tracheal bronchitis -moraxella Abx Rocephin 1/29>>1/29 1 dose, AZM 1/29>>1/29 1 dose Unasyn 1/31>>2/6 P:   Micro and abx as above Unasyn dc'd 2/6  ENDOCRINE A:   Type 2 DM with hyperglycemia (HA1C 8.4 05/27/14)  Low tsh ? Sick euthyroid; free T4 normal P:    Lantus 40 Cont SSI-R   NEUROLOGIC A:   Acute encephalopathy, fully resolved Deconditioning  P:   PT/OT  PM valve -speech has assisted Swallow eval    FAMILY  - Updates: Updated pt, son, wife on 2/1   Plan for LTAC vs vent SNF - unclear if he will need nocturnal ventilation. Did not tolerate continuous Tcollar overnight 2/5 or 2/6, requiring vent rest     CD Young, MD PCCM p [phone] Waymon BudgeYOUNG,CLINTON D MD  07/19/2014, 9:54 AM

## 2014-07-19 NOTE — Progress Notes (Signed)
RT placed patient back on ATC 60% at 10L. Patietn sats are 95% and patient is resting comfortably at this time. Will continue to monitor.

## 2014-07-19 NOTE — Progress Notes (Signed)
Tried by RT to place patient on trach collar but he is not able to tolerate being on trach collar.  Dr. Maple HudsonYoung was updated on this and informed him that I am not able to feed or give patient's med.  MD stated to keep a note about this because patient might end up on  GT.

## 2014-07-19 NOTE — Progress Notes (Signed)
Patient has no urine output for 12 hours.  Bladder scan done, volume is 0 ml but patient's abdomen is obese.  MD is paged to update.  Awaiting for him to call me back.

## 2014-07-19 NOTE — Progress Notes (Signed)
Subjective: Back on vent. No new trach issues  Objective: Vital signs in last 24 hours: Temp:  [98.2 F (36.8 C)-99.1 F (37.3 C)] 99.1 F (37.3 C) (02/07 0725) Pulse Rate:  [85-116] 93 (02/07 0725) Resp:  [18-31] 20 (02/07 0725) BP: (146-175)/(70-89) 158/89 mmHg (02/07 0725) SpO2:  [83 %-100 %] 98 % (02/07 0736) FiO2 (%):  [50 %-60 %] 50 % (02/07 0736) Weight:  [326 lb (147.873 kg)] 326 lb (147.873 kg) (02/07 0551)  PHYSICAL EXAMINATION: General: obese, awake, on vent. Neuro: no focal deficits, following commands, eyes open HEENT: [place]/AT [place]/OC: Normal mucosa. No lesion. Neck: Trach midline, in place. Ext: 1-2+ pitting edema in lower extremities Skin: chronic venous skin changes in lower extremities, erythema    Recent Labs  07/18/14 0309 07/19/14 0355  WBC 11.4* 9.0  HGB 13.0 12.7*  HCT 44.0 42.6  PLT 286 246    Recent Labs  07/17/14 0655 07/18/14 0309  NA 151* 144  K 3.9 4.2  CL 109 104  CO2 35* 33*  GLUCOSE 120* 223*  BUN 14 17  CREATININE 0.74 0.70  CALCIUM 9.3 9.0    Medications:  I have reviewed the patient's current medications. Scheduled: . antiseptic oral rinse  7 mL Mouth Rinse QID  . aspirin  81 mg Oral Daily  . brimonidine  1 drop Both Eyes BID  . budesonide  0.5 mg Nebulization BID  . chlorhexidine  15 mL Mouth Rinse BID  . dorzolamide-timolol  1 drop Both Eyes BID  . insulin aspart  0-15 Units Subcutaneous TID WC  . insulin glargine  40 Units Subcutaneous QHS  . ipratropium-albuterol  3 mL Nebulization Q6H  . latanoprost  1 drop Both Eyes QHS  . pantoprazole  40 mg Oral Q24H  . patient's guide to using coumadin book   Does not apply Once  . warfarin  10 mg Oral ONCE-1800  . warfarin   Does not apply Once  . Warfarin - Pharmacist Dosing Inpatient   Does not apply q1800   WUJ:WJXBJYPRN:sodium chloride, albuterol, fentaNYL, hydrALAZINE, metoprolol, oxyCODONE-acetaminophen  Assessment/Plan: POD#5 s/p trach. Doing well, tolerated trach collar  during daytime. Now back on vent. Due to his difficult airway, will leave fresh trach in place for 2 weeks before changing his trach.   LOS: 9 days   Blake Summers,SUI W 07/19/2014, 9:17 AM

## 2014-07-20 LAB — CBC
HCT: 41 % (ref 39.0–52.0)
HEMOGLOBIN: 12 g/dL — AB (ref 13.0–17.0)
MCH: 28 pg (ref 26.0–34.0)
MCHC: 29.3 g/dL — ABNORMAL LOW (ref 30.0–36.0)
MCV: 95.6 fL (ref 78.0–100.0)
Platelets: 211 10*3/uL (ref 150–400)
RBC: 4.29 MIL/uL (ref 4.22–5.81)
RDW: 15.7 % — ABNORMAL HIGH (ref 11.5–15.5)
WBC: 7.5 10*3/uL (ref 4.0–10.5)

## 2014-07-20 LAB — PROTIME-INR
INR: 2 — ABNORMAL HIGH (ref 0.00–1.49)
PROTHROMBIN TIME: 22.8 s — AB (ref 11.6–15.2)

## 2014-07-20 LAB — HEPARIN LEVEL (UNFRACTIONATED): HEPARIN UNFRACTIONATED: 0.46 [IU]/mL (ref 0.30–0.70)

## 2014-07-20 MED ORDER — SODIUM CHLORIDE 0.9 % IV SOLN
INTRAVENOUS | Status: DC
  Administered 2014-07-20: 1000 mL via INTRAVENOUS
  Administered 2014-07-21: 03:00:00 via INTRAVENOUS

## 2014-07-20 MED ORDER — WARFARIN SODIUM 5 MG PO TABS
5.0000 mg | ORAL_TABLET | Freq: Once | ORAL | Status: AC
  Filled 2014-07-20: qty 1

## 2014-07-20 NOTE — Progress Notes (Signed)
Sutures in place.  

## 2014-07-20 NOTE — Clinical Social Work Psychosocial (Signed)
Clinical Social Work Department BRIEF PSYCHOSOCIAL ASSESSMENT 07/20/2014  Patient:  Blake Summers,Blake Summers     Account Number:  [id number]     Admit date:  07/10/2014  Clinical Social Worker:  Merlyn LotHOLOMAN,Arkie Tagliaferro, CLINICAL SOCIAL WORKER  Date/Time:  07/20/2014 04:37 PM  Referred by:  Physician  Date Referred:  07/20/2014 Referred for  SNF Placement   Other Referral:   Interview type:  Family Other interview type:    PSYCHOSOCIAL DATA Living Status:  WIFE Admitted from facility:   Level of care:   Primary support name:  Blake Summers Primary support relationship to patient:  SPOUSE Degree of support available:   High level of support from patients spouse, Blake Summers, who states that they are extremely codependent on one another and love each other deeply    CURRENT CONCERNS Current Concerns  Post-Acute Placement   Other Concerns:    SOCIAL WORK ASSESSMENT / PLAN CSW spoke with patient wife concerning SNF back up to home health.  Patient wife is very hopeful that patient will be able to DC home but she has been unable to find a 2nd 24 hour caregiver that is required by the home health agency to take the pt home on the vent.  Pt wife states that her son has medical issues which prohibit him helping while the daughter has personal issues (a pending separation and small children) which limit her time.    Pt wife is very worried about pt potentially being sent far aware for SNF but understands she can not take him home at this time- pt wife is appreciative of CSW involvement and support in finding closeby Vent SNF.   Assessment/plan status:  Psychosocial Support/Ongoing Assessment of Needs Other assessment/ plan:   FL2  PASAR   Information/referral to community resources:    PATIENT'S/FAMILY'S RESPONSE TO PLAN OF CARE: Patient wife is agreeable to plan for Vent SNF if she is unable to find 2nd caregiver for home health.  Pt wife is very realistic about pt condition and is hopeful that he will improve  enough to transition back home.       Merlyn LotJenna Holoman, LCSWA Clinical Social Worker [phone]

## 2014-07-20 NOTE — Progress Notes (Signed)
Speech Language Pathology Treatment: Hillary BowPassy Muir Speaking valve  Patient Details Name: Blake Summers MRN: [id number] DOB: Dec 19, 1945 Today's Date: 07/20/2014 Time: [id number] SLP Time Calculation (min) (ACUTE ONLY): 35 min  Assessment / Plan / Recommendation Clinical Impression  Patient back on vent, writing notes to wife.  Wife and pt communicated concern re: pt's care, asking why trays are delivered to his room and pt is not allowed to eat.  SLP explained that pt is to be off the vent and wearing PMSV for po's, unless tested for po's while on the vent. Pt has been on and off vent frequently, and wife is concerned that pt is not able to obtain adequate nutrition.  Wife also reported to this SLP that when she came in on Saturday 2/6 around 13:30, the Passy-Muir Speaking valve was in place, and the cuff was fully inflated and pt was alone in his room.  Wife stated that pt was desating and pt had to be put back on the vent.  Pt states that the staff has not responded to the call bell consistenly and that he is afraid when his wife leaves.  All concerns were relayed by this SLP to pt's RN, as well as the Director of the unit, for follow-up with the pt/family, and involved staff.  Wife was reassured that her concerns would be addressed.    Recommend Nutrition Consult; consider FEES to assess swallowing while on ventilator.   HPI HPI: [name] is a 69yo man w/ PMHx of obstructive sleep apnea, obesity hypoventilation syndrome, GERD, Type 2 DM, and chronic hypercarbic respiratory failure on BiPAP at home who presented with worsening SOB for the past week. Found to be in acute on chronic hypercarbic respiratory failure. Pt intubated 1/29 s/p trach 2/2    Pertinent Vitals Pain Assessment: 0-10 Pain Score: 8  Pain Location: Back Pain Descriptors / Indicators: Constant Pain Intervention(s): Patient requesting pain meds-RN notified  SLP Plan  Continue with current plan of care;Other (Comment) (PMV adn po's  when off vent)    Recommendations        Patient may use Passy-Muir Speech Valve: During all waking hours (remove during sleep) (When off vent)       Oral Care Recommendations: Oral care BID Follow up Recommendations: Home health SLP Plan: Continue with current plan of care;Other (Comment) (PMV adn po's when off vent)    GO     Maryjo RochesterWillis, [place] Pallas T 07/20/2014, 2:06 PM

## 2014-07-20 NOTE — Progress Notes (Signed)
ATC trial tried on ATC 60%. Pt tol 5-7610min then had increased WOB and desat. Per MD for RT to place pt back on vent. Placed pt back on vent with same settings with no complications.

## 2014-07-20 NOTE — Progress Notes (Signed)
Patient was out of bed per patient's personal wheelchair using a maxisky.  Patient tolerated  well.  PO med   and drink given after patient was placed on trach collar by RT.  PT came and made aware that patient just got out of bed per [facility].  Prior to  this transfer, patient was placed on a bedpan and urinal offered.  Patient passed gas, no bowel movement and unable to urinate.  Wife at bedside this entire time.  Less than 2 hours, (from being OOB)  patient faced is flushed and he seems tired.  RT placed him back on the vent, and RT also helped us put him back on bed per maxisky.  PT also saw him today. Please refer to their notes.

## 2014-07-20 NOTE — Progress Notes (Signed)
Subjective: Back on vent. Could not tolerate prolonged trach collar.  Objective: Vital signs in last 24 hours: Temp:  [98.4 F (36.9 C)-99.3 F (37.4 C)] 98.5 F (36.9 C) (02/08 0700) Pulse Rate:  [90-111] 91 (02/08 0854) Resp:  [17-28] 20 (02/08 0854) BP: (120-178)/(44-86) 138/65 mmHg (02/08 0854) SpO2:  [92 %-100 %] 100 % (02/08 0902) FiO2 (%):  [50 %-80 %] 50 % (02/08 0902) Weight:  [331 lb (150.141 kg)] 331 lb (150.141 kg) (02/08 0500)  PHYSICAL EXAMINATION: General: obese, awake, on vent. Neuro: no focal deficits, eyes open HEENT: [place]/AT [place]/OC: Normal mucosa. No lesion. Neck: Trach midline, in place. Ext: 1-2+ pitting edema in lower extremities Skin: chronic venous skin changes in lower extremities, erythema    Recent Labs  07/19/14 0355 07/20/14 0418  WBC 9.0 7.5  HGB 12.7* 12.0*  HCT 42.6 41.0  PLT 246 211    Recent Labs  07/18/14 0309  NA 144  K 4.2  CL 104  CO2 33*  GLUCOSE 223*  BUN 17  CREATININE 0.70  CALCIUM 9.0    Medications:  I have reviewed the patient's current medications. Scheduled: . antiseptic oral rinse  7 mL Mouth Rinse QID  . aspirin  81 mg Oral Daily  . brimonidine  1 drop Both Eyes BID  . budesonide  0.5 mg Nebulization BID  . chlorhexidine  15 mL Mouth Rinse BID  . dorzolamide-timolol  1 drop Both Eyes BID  . insulin aspart  0-15 Units Subcutaneous TID WC  . insulin glargine  40 Units Subcutaneous QHS  . ipratropium-albuterol  3 mL Nebulization Q6H  . latanoprost  1 drop Both Eyes QHS  . pantoprazole  40 mg Oral Q24H  . warfarin   Does not apply Once  . Warfarin - Pharmacist Dosing Inpatient   Does not apply q1800   ZOX:WRUEAVPRN:sodium chloride, albuterol, fentaNYL, hydrALAZINE, metoprolol, oxyCODONE-acetaminophen  Assessment/Plan: POD#6 s/p trach. Doing well, tolerated trach collar during daytime. Now back on vent. Due to his difficult airway, will leave fresh trach in place for 2 weeks.  Will change trach early next week.   LOS: 10 days   Binyomin Brann,SUI W 07/20/2014, 9:57 AM

## 2014-07-20 NOTE — Progress Notes (Signed)
PULMONARY / CRITICAL CARE MEDICINE   Name: Blake Summers MRN: [id number] DOB: 06-12-1946    ADMISSION DATE:  07/10/2014 CONSULTATION DATE:  07/10/14  REFERRING MD :  Dr. Gonzella Lexhungel  CHIEF COMPLAINT:  Shortness of breath  INITIAL PRESENTATION: Blake Summers is a 69yo man w/ PMHx of obstructive sleep apnea, obesity hypoventilation syndrome, GERD, Type 2 DM, and chronic hypercarbic respiratory failure on BiPAP at home who presented with worsening SOB for the past week. Found to be in acute on chronic hypercarbic respiratory failure.   MAJOR EVENTS/TEST RESULTS: 1/29 CT Angio Chest: No PE. Cardiomegaly, sm R pleural effusion and interstitial thickening likely CHF.  1/29 failed NPPV. Intubated 1/30 Persistent AF. UFH gtt initiated. Screened for MIND study 1/31: copious thick secretions but passed SBT. Extubated and failed several hours later. Re-intubated after failed NPPV attempt. UA looked very raw and edematous on re-intubation. Abx started for presumed PNA 2/3 Trached by ENT due to difficult airway.  Note ENT recs to leave fresh trach in place for approx 2 weeks 2/8 unable to tolerate ATC, placed back on vent.  INDWELLING DEVICES:: ETT 1/29 >> trach XL #6 2/2 >>  MICRO DATA: Resp 1/30  >> moraxella Resp 1/31>>oral flora Blood 1/29 >> neg MRSA 1/29>>neg  Flu 1/29 neg  ANTIMICROBIALS:  Unasyn 1/31 >>> 2/6   SUBJECTIVE:   Unable to tolerate prolonged ATC (did tolerate for ~ 7 - 8 hours 2/7).  Placed back on vent this AM.  Now requesting to go back to ATC so Blake Summers can eat  VITAL SIGNS: Temp:  [98.4 F (36.9 C)-99.3 F (37.4 C)] 98.4 F (36.9 C) (02/08 1155) Pulse Rate:  [89-111] 89 (02/08 1155) Resp:  [18-28] 20 (02/08 1155) BP: (121-178)/(50-86) 156/74 mmHg (02/08 1155) SpO2:  [92 %-100 %] 99 % (02/08 1155) FiO2 (%):  [50 %-80 %] 50 % (02/08 1230) Weight:  [150.141 kg (331 lb)] 150.141 kg (331 lb) (02/08 0500) HEMODYNAMICS:   Vent Mode:  [-] PRVC FiO2 (%):  [50 %-80 %] 50 % Set  Rate:  [18 bmp] 18 bmp Vt Set:  [500 mL] 500 mL PEEP:  [5 cmH20] 5 cmH20 Plateau Pressure:  [17 cmH20-30 cmH20] 27 cmH20 INTAKE / OUTPUT:  Intake/Output Summary (Last 24 hours) at 07/20/14 1314 Last data filed at 07/20/14 0800  Gross per 24 hour  Intake   1415 ml  Output   1150 ml  Net    265 ml    PHYSICAL EXAMINATION: General: Obese male, resting in bed, in NAD. Neuro: no focal deficits, moving all 4 ext, following commands. HEENT: [place]/AT, tstomy XL #6 Cardiovascular: irreg , no murmurs  Lungs: quiet breath sounds b/l, decreased BL, unlabored on vent Abdomen: obese, edematous Ext: 1-2+ pitting edema in lower extremities Skin: chronic venous skin changes in lower extremities, erythema   LABS: CMP Latest Ref Rng 07/18/2014 07/17/2014 07/15/2014  Glucose 70 - 99 mg/dL 409(W223(H) 119(J120(H) 478(G200(H)  BUN 6 - 23 mg/dL 17 14 15   Creatinine 0.50 - 1.35 mg/dL 9.560.70 2.130.74 0.860.69  Sodium 135 - 145 mmol/L 144 151(H) 144  Potassium 3.5 - 5.1 mmol/L 4.2 3.9 3.8  Chloride 96 - 112 mmol/L 104 109 103  CO2 19 - 32 mmol/L 33(H) 35(H) 34(H)  Calcium 8.4 - 10.5 mg/dL 9.0 9.3 8.8  Total Protein 6.0 - 8.3 g/dL - - -  Total Bilirubin 0.3 - 1.2 mg/dL - - -  Alkaline Phos 39 - 117 U/L - - -  AST 0 - 37  U/L - - -  ALT 0 - 53 U/L - - -   CBC Latest Ref Rng 07/20/2014 07/19/2014 07/18/2014  WBC 4.0 - 10.5 K/uL 7.5 9.0 11.4(H)  Hemoglobin 13.0 - 17.0 g/dL 12.0(L) 12.7(L) 13.0  Hematocrit 39.0 - 52.0 % 41.0 42.6 44.0  Platelets 150 - 400 K/uL 211 246 286    CXR: cardiomegaly  ASSESSMENT / PLAN:  PULMONARY ETT 1/29>> 1/31, 1/31 >> 2/2  Trach 2/2 >>> A: Acute on chronic hypercarbic respiratory failure OSA/OHS  Tracheostomy status Remote smoking P:   Continue with ATC as tolerated, push for extending time to goal 24hrs. Vent rest as needed if unable to tolerate ATC. Continue PMV. PRN Alb, Pulmicort Duoneb q6 Mobilize. CXR intermittently.  CARDIOVASCULAR A:  H/O HTN, HLD AF rate controlled, CVR - unclear  duration Echocardiogram  > poor study, no good windows P:  Continue heparin gtt for now, if INR on 2/9 remains > 2 then will d/c heparin. Continue coumadin per pharmacy. Aspirin 81 per tube. Cont hydralazine prn q4 SBP >170. Cont metoprolol prn 2.5-5 mg q3 prn HR >115.  RENAL A:   Hypokalemia , hypophos- repleted Hypernatremia - resolved P:   Goal K >4, Mag >2. Monitor I/Os, daily wts. D/c D5 IVF's, change to NS, keep at 50. BMET in AM.  GASTROINTESTINAL A:   GERD, chronic PPI use Nutrition Morbid obesity P:   Protonix 40 mg iv qd. Heart heathy diet. Dietician consult for calorie counts, diet recommendations, weight loss assistance.  HEMATOLOGIC A:   Normocytic anemia Hgb stable  P:  DVT px: heparin gtt (potentiall d/c 2/9 if INR remains therapeutic) / Coumadin. Transfuse per usual guidelines. CBC in AM.  INFECTIOUS A:  Leukocytosis ? tracheal bronchitis (moraxella) - resolved 2/7 Abx Rocephin 1/29>>1/29 1 dose, AZM 1/29>>1/29 1 dose Unasyn 1/31>>2/6 P:   Continue to monitor off abx.  ENDOCRINE A:   Type 2 DM with hyperglycemia (HA1C 8.4 05/27/14)  Low tsh ? Sick euthyroid; free T4 normal P:   Lantus 40 Cont SSI-R   NEUROLOGIC A:   Acute encephalopathy, fully resolved Pain Deconditioning P:   PT/OT eval & treat. Continue Oxycodone PRN, Fentanyl PRN.  FAMILY  - Updates: Updated pt and his wife 2/8.   Plan for LTAC vs vent SNF - unclear if Blake Summers will need nocturnal ventilation. Did not tolerate continuous Tcollar overnight 2/5, 2/6, or 2/7.  Requiring vent rest    Blake Summers, [place] [facility] [place] [facility] Pgr: [phone]  or [phone] 07/20/2014, 1:43 PM  Reviewed above, and examined.  Blake Summers denies chest pain.  Trach site clean.  Blake Summers is not able to remain off vent for full 24 hrs.  Explained to his wife that Blake Summers has OSA with OHS, and that tracheostomy alone is not sufficient for him.  Explained how his health issues are  largely related to his weight.  Will try to push for trach collar during the day, but Blake Summers needs vent support while asleep.  Will ask PT to assess.  Will ask nutrition to assist with diet modification to add in weight loss.  His wife would like to arrange for home vent if possible >> not sure she has the support system at home to do this.  Coralyn Helling, MD [facility] Pulmonary/Critical Care 07/20/2014, 3:54 PM Pager:  [phone] After 3pm call: [phone]

## 2014-07-20 NOTE — Progress Notes (Signed)
ANTICOAGULATION CONSULT NOTE - Follow-up Consult  Pharmacy Consult for Heparin and Coumadin Indication: atrial fibrillation  Allergies  Allergen Reactions  . Neosporin [Neomycin-Polymyxin-Gramicidin] Other (See Comments)    unknown    Patient Measurements: Height: 5\' 7"  (170.2 cm) Weight: (!) 331 lb (150.141 kg) IBW/kg (Calculated) : 66.1 Heparin Dosing Weight: 104 kg (calculated based on admit weight)  Vital Signs: Temp: 98.5 F (36.9 C) (02/08 0700) Temp Source: Oral (02/08 0700) BP: 138/65 mmHg (02/08 0854) Pulse Rate: 91 (02/08 0854)  Labs:  Recent Labs  07/18/14 0309 07/19/14 0355 07/19/14 0358 07/20/14 0418  HGB 13.0 12.7*  --  12.0*  HCT 44.0 42.6  --  41.0  PLT 286 246  --  211  LABPROT 14.4 16.3*  --  22.8*  INR 1.11 1.29  --  2.00*  HEPARINUNFRC 0.59  --  0.46 0.46  CREATININE 0.70  --   --   --     Estimated Creatinine Clearance: 122.9 mL/min (by C-G formula based on Cr of 0.7).  Assessment: 69yo male on IV heparin for new afib. Heparin level remains therapeutic at 0.46 today on 2500 units/hr. CBC stable, no bleeding noted. Started coumadin 2/5. INR today has jumped 1.29 >>2.  Will continue heparin overlap and if INR remains >2 tomorrow, can discontinue heparin.  Goal of Therapy:  INR 2-3 Heparin level 0.3-0.7 units/ml Monitor platelets by anticoagulation protocol: Yes   Plan:  Continue Heparin 2500 units/hr Coumadin 5 mg po x 1 - Reduced dose based on recent INR increase. Monitor daily heparin level, INR, CBC, s/sx of bleeding  Toys 'R' UsKimberly Napoleon Monacelli, Pharm.D., BCPS Clinical Pharmacist Pager [phone] 07/20/2014 10:24 AM

## 2014-07-20 NOTE — Progress Notes (Signed)
Upon assessment, patient very drowsy and slow to respond. Not following all commands. Only alert to self. Patient had been on trach collar most of the day. Lung sounds rhonchus and breathing is labored, RR high 20's. Sats 87%. Patient placed back on the vent, FIO2 50%. Suctioned and inner cannula changed. Mental status greatly improved once back on vent. Responsive and following commands. Alert to self and place and is watching tv. Respiratory therapist updated. Patient will be kept on vent for now. Will continue to monitor.  M.Foster SimpsonPiel, RN

## 2014-07-20 NOTE — Evaluation (Signed)
Physical Therapy Evaluation Patient Details Name: Blake Summers MRN: [id number] DOB: Jan 01, 1946 Today's Date: 07/20/2014   History of Present Illness  [name] is a 69yo man w/ PMHx of obstructive sleep apnea, obesity hypoventilation syndrome, GERD, Type 2 DM, and chronic hypercarbic respiratory failure on BiPAP at home who presented with worsening SOB for the past week. Found to be in acute on chronic hypercarbic respiratory failure. Pt intubated 1/29 s/p trach 2/2  Clinical Impression  Patient seen for progression of mobility this session. Patient was able to perform multiple transfers during session with min guard and no physical assist. Patient did demonstrate increased fatigue and WOBing. SpO2 on 60% ATC dropped to 83% briefly, then remained at 87-88%, after several minutes of rest and cues for pursed lip breathing, patient was able to rebound to 90%.  Spoke with nursing regarding concerns for mobility, resp. Status as well as fatigue. Patient's limiting factor remains resp. Status. If this improves feel patient will progress quickly. Will continue to see and progress as tolerated.    Follow Up Recommendations Home health PT;Supervision for mobility/OOB    Equipment Recommendations  None recommended by PT    Recommendations for Other Services Speech consult     Precautions / Restrictions Precautions Precautions: Fall Precaution Comments: trach Restrictions Weight Bearing Restrictions: No      Mobility  Bed Mobility               General bed mobility comments: pt in power chair on arrival  Transfers Overall transfer level: Needs assistance Equipment used:  (used back of chair for BLE support during transfers) Transfers: Sit to/from UGI CorporationStand;Stand Pivot Transfers Sit to Stand: Min guard         General transfer comment: performed x3 sit <> stands and 2 SPT during session, increased effort but no physical assist required.  Ambulation/Gait                 Stairs            Wheelchair Mobility    Modified Rankin (Stroke Patients Only)       Balance     Sitting balance-Leahy Scale: Fair       Standing balance-Leahy Scale: Poor                               Pertinent Vitals/Pain Pain Assessment: No/denies pain    Home Living                        Prior Function                 Hand Dominance        Extremity/Trunk Assessment                         Communication      Cognition Arousal/Alertness: Awake/alert Behavior During Therapy: WFL for tasks assessed/performed Overall Cognitive Status: Within Functional Limits for tasks assessed                      General Comments General comments (skin integrity, edema, etc.): VS measure and hygiene performed throughout session, patient with poor control over lose stools during transfer as well as incontinent of urine, assisted patient with hygiene and pericare. Increased WOB during activity. Resp. therapist monitoring patient saturations    Exercises  Assessment/Plan    PT Assessment    PT Diagnosis     PT Problem List    PT Treatment Interventions     PT Goals (Current goals can be found in the Care Plan section) Acute Rehab PT Goals Patient Stated Goal: return home with wife's assist PT Goal Formulation: With patient Time For Goal Achievement: 07/30/14 Potential to Achieve Goals: Fair    Frequency Min 2X/week   Barriers to discharge        Co-evaluation               End of Session   Activity Tolerance: Patient limited by fatigue Patient left: in chair;with call bell/phone within reach Nurse Communication: Mobility status         Time: 1610-9604 PT Time Calculation (min) (ACUTE ONLY): 42 min   Charges:     PT Treatments $Therapeutic Activity: 8-22 mins $Self Care/Home Management: 23-37   PT G CodesFabio Asa 08/18/14, 5:11 PM Blake Summers, PT DPT   [phone]

## 2014-07-21 LAB — CBC
HCT: 41.5 % (ref 39.0–52.0)
Hemoglobin: 12.1 g/dL — ABNORMAL LOW (ref 13.0–17.0)
MCH: 27.3 pg (ref 26.0–34.0)
MCHC: 29.2 g/dL — AB (ref 30.0–36.0)
MCV: 93.7 fL (ref 78.0–100.0)
PLATELETS: 221 10*3/uL (ref 150–400)
RBC: 4.43 MIL/uL (ref 4.22–5.81)
RDW: 15.5 % (ref 11.5–15.5)
WBC: 6.3 10*3/uL (ref 4.0–10.5)

## 2014-07-21 LAB — GLUCOSE, CAPILLARY
GLUCOSE-CAPILLARY: 134 mg/dL — AB (ref 70–99)
GLUCOSE-CAPILLARY: 134 mg/dL — AB (ref 70–99)
Glucose-Capillary: 146 mg/dL — ABNORMAL HIGH (ref 70–99)
Glucose-Capillary: 151 mg/dL — ABNORMAL HIGH (ref 70–99)
Glucose-Capillary: 100 mg/dL — ABNORMAL HIGH (ref 70–99)
Glucose-Capillary: 107 mg/dL — ABNORMAL HIGH (ref 70–99)
Glucose-Capillary: 111 mg/dL — ABNORMAL HIGH (ref 70–99)

## 2014-07-21 LAB — BASIC METABOLIC PANEL
Anion gap: 9 (ref 5–15)
BUN: 11 mg/dL (ref 6–23)
CALCIUM: 8.8 mg/dL (ref 8.4–10.5)
CO2: 31 mmol/L (ref 19–32)
Chloride: 101 mmol/L (ref 96–112)
Creatinine, Ser: 0.66 mg/dL (ref 0.50–1.35)
GFR calc Af Amer: >90 mL/min (ref >90)
GLUCOSE: 111 mg/dL — AB (ref 70–99)
Potassium: 4.2 mmol/L (ref 3.5–5.1)
SODIUM: 141 mmol/L (ref 135–145)

## 2014-07-21 LAB — CLOSTRIDIUM DIFFICILE BY PCR: CDIFFPCR: NEGATIVE

## 2014-07-21 LAB — MAGNESIUM: MAGNESIUM: 2.1 mg/dL (ref 1.5–2.5)

## 2014-07-21 LAB — PHOSPHORUS: Phosphorus: 2 mg/dL — ABNORMAL LOW (ref 2.3–4.6)

## 2014-07-21 LAB — PROTIME-INR
INR: 2.47 — AB (ref 0.00–1.49)
Prothrombin Time: 26.9 seconds — ABNORMAL HIGH (ref 11.6–15.2)

## 2014-07-21 LAB — HEPARIN LEVEL (UNFRACTIONATED): HEPARIN UNFRACTIONATED: 0.35 [IU]/mL (ref 0.30–0.70)

## 2014-07-21 MED ORDER — DILTIAZEM HCL 60 MG PO TABS
60.0000 mg | ORAL_TABLET | Freq: Three times a day (TID) | ORAL | Status: DC
  Administered 2014-07-22 – 2014-07-23 (×3): 60 mg via ORAL
  Filled 2014-07-21 (×9): qty 1

## 2014-07-21 MED ORDER — FUROSEMIDE 10 MG/ML IJ SOLN
40.0000 mg | Freq: Once | INTRAMUSCULAR | Status: AC
  Administered 2014-07-21: 40 mg via INTRAVENOUS
  Filled 2014-07-21: qty 4

## 2014-07-21 MED ORDER — WARFARIN SODIUM 5 MG PO TABS
5.0000 mg | ORAL_TABLET | Freq: Once | ORAL | Status: DC
  Filled 2014-07-21: qty 1

## 2014-07-21 MED ORDER — PRAVASTATIN SODIUM 40 MG PO TABS
40.0000 mg | ORAL_TABLET | Freq: Every day | ORAL | Status: DC
  Administered 2014-07-22: 40 mg via ORAL
  Filled 2014-07-21 (×3): qty 1

## 2014-07-21 NOTE — Clinical Social Work Note (Signed)
[facility] accept patient tomorrow 07/22/14  Merlyn LotJenna Holoman, [facility] CenterCSWA Clinical Social Worker [phone]

## 2014-07-21 NOTE — Progress Notes (Signed)
SLP Cancellation Note  Patient Details Name: Blake Summers MRN: [id number] DOB: [birth year]   Cancelled treatment:    Pt unable to transition to ATC today per RT.  Pt talking around trach/inflated cuff, writing to communicate.  Verbal orders received per Dr. Craige CottaSood for inline vent PMV evaluation.  Spoke with RT, who states pt likely not ready today.  Will f/u next date for improved readiness.  Until then, he is unable to use traditional PMV and is NPO while supported on vent. SLP will follow.      Blenda MountsCouture, Jaeliana Lococo Laurice 07/21/2014, 2:23 PM

## 2014-07-21 NOTE — Progress Notes (Signed)
PULMONARY / CRITICAL CARE MEDICINE   Name: Blake SalenJoseph L Summers MRN: [id number] DOB: 1945/10/02    ADMISSION DATE:  07/10/2014 CONSULTATION DATE:  07/10/14  REFERRING MD :  Dr. Gonzella Lexhungel  CHIEF COMPLAINT:  Shortness of breath  INITIAL PRESENTATION:  69 yo male former smoker presented with progressive dyspnea with A on C hypoxic/hypercapnic respiratory failure from tracheobronchitis in setting of OSA/OHS.  MAJOR EVENTS: 1/29 Admit, VDRF 1/30 A fib, started heparin gtt 1/31 Extubated >> re-intubated 2/03 Trach by ENT  TEST RESULTS: 1/29 CT Angio Chest >> interstitial thickening likely from CHF, small Rt effusion  INDWELLING DEVICES:: ETT 1/29 >> trach XL #6 2/2 >>  MICRO DATA: Resp 1/30  >> moraxella  ANTIMICROBIALS:  Unasyn 1/31 >>> 2/6   SUBJECTIVE:    Denies chest/abd pain.  VITAL SIGNS: Temp:  [98.4 F (36.9 C)-98.8 F (37.1 C)] 98.8 F (37.1 C) (02/09 0741) Pulse Rate:  [87-103] 103 (02/09 0815) Resp:  [19-29] 22 (02/09 0815) BP: (105-158)/(68-98) 154/87 mmHg (02/09 0741) SpO2:  [92 %-100 %] 94 % (02/09 0741) FiO2 (%):  [50 %-60 %] 50 % (02/09 0741) HEMODYNAMICS:   Vent Mode:  [-] PRVC FiO2 (%):  [50 %-60 %] 50 % Set Rate:  [18 bmp] 18 bmp Vt Set:  [500 mL] 500 mL PEEP:  [5 cmH20] 5 cmH20 Plateau Pressure:  [17 cmH20-32 cmH20] 28 cmH20 INTAKE / OUTPUT:  Intake/Output Summary (Last 24 hours) at 07/21/14 1001 Last data filed at 07/21/14 0800  Gross per 24 hour  Intake   1650 ml  Output      0 ml  Net   1650 ml    PHYSICAL EXAMINATION: General: pleasant Neuro: normal strength HEENT: [place]/AT, tstomy XL #6 Cardiovascular: irreg , no murmurs  Lungs: quiet breath sounds b/l, decreased BL, unlabored on vent Abdomen: obese, soft Ext: 1+ pitting edema in lower extremities Skin: chronic venous skin changes in lower extremities, erythema    LABS: CMP Latest Ref Rng 07/21/2014 07/18/2014 07/17/2014  Glucose 70 - 99 mg/dL 782(N111(H) 562(Z223(H) 308(M120(H)  BUN 6 - 23 mg/dL 11 17  14   Creatinine 0.50 - 1.35 mg/dL 5.780.66 4.690.70 6.290.74  Sodium 135 - 145 mmol/L 141 144 151(H)  Potassium 3.5 - 5.1 mmol/L 4.2 4.2 3.9  Chloride 96 - 112 mmol/L 101 104 109  CO2 19 - 32 mmol/L 31 33(H) 35(H)  Calcium 8.4 - 10.5 mg/dL 8.8 9.0 9.3  Total Protein 6.0 - 8.3 g/dL - - -  Total Bilirubin 0.3 - 1.2 mg/dL - - -  Alkaline Phos 39 - 117 U/L - - -  AST 0 - 37 U/L - - -  ALT 0 - 53 U/L - - -    CBC Latest Ref Rng 07/21/2014 07/20/2014 07/19/2014  WBC 4.0 - 10.5 K/uL 6.3 7.5 9.0  Hemoglobin 13.0 - 17.0 g/dL 12.1(L) 12.0(L) 12.7(L)  Hematocrit 39.0 - 52.0 % 41.5 41.0 42.6  Platelets 150 - 400 K/uL 221 211 246    CBG (last 3)   Recent Labs  07/19/14 1233 07/19/14 1700 07/19/14 2055  GLUCAP 182* 151* 186*      ASSESSMENT / PLAN:  Acute on chronic respiratory failure 2nd to OSA/OHS. S/p tracheostomy. Plan: - try to push trach collar during the day - will need vent support at night - change BD's to prn - d/c pulmicort - f/u CXR, ABG as needed - speech to assess for PM valve  A fib with RVR >> rate controlled. Hx of HTN, HLD.  Plan: - continue ASA - coumadin per pharmacy - hold outpt hyzaar - restart lovastatin (pravastatin while in hospital), cardizem - lasix 40 mg x one on 2/09  Hypokalemia , hypophosphatemia. Hypernatremia - resolved P:   - F/u and replace electrolytes as needed  Hx of GERD. Morbid obesity. Dysphagia. P:   - Nutrition to assess diet adjustment for weight loss - Continue protonix - speech to do FEES  Type 2 DM. Plan: - SSI with lantus  Chronic pain. Deconditioning. Plan: - OOB, PT/OT - limit narcotics as tolerated  Hx of gout. Plan: - hold outpt allopurinol  Moraxella tracheobronchitis >> completed abx 2/07.  Acute encephalopathy 2nd to respiratory failure >> resolved.  Summary: His wife would like to have him set up for home vent >> not sure she has social support to manage this.  Coralyn Helling, MD [facility] Pulmonary/Critical  Care 07/21/2014, 10:12 AM Pager:  [phone] After 3pm call: [phone]

## 2014-07-21 NOTE — Progress Notes (Signed)
During assessment of pt, central line dressing was soiled. Previous dressing change was 2/7. During dressing change, small pockets of Charon Akamine puss was noted around the site. Also, the entry and suture points is red and irritated. Pt stated that the site is painful to touch. E-link called and MD notified.

## 2014-07-21 NOTE — Progress Notes (Signed)
RT Note: RT re attempted to place patient on ATC. Patient requested that RT place patients speaking valve on when off of the ventilator stating that he tolerates it better like that. Patient did not tolerate being off of the ventilator with and without his speaking valve. He only lasted a few minutes then started to complain that he could not get air in and out and felt short of breath. Patient was placed back on the ventilator. Rt will continue to monitor and patients RN was notified.

## 2014-07-21 NOTE — Progress Notes (Signed)
RT Note: RT attempted to wean patient this am. He was placed on trach collar but only lasted a few minutes. Patient began to complain of shortness of breath and difficulty breathing very quickly after being placed on trach collar. Patient was placed back on his ventilator in his rest mode and is tolerating it well. Rt will continue to monitor.

## 2014-07-21 NOTE — Progress Notes (Signed)
ANTICOAGULATION CONSULT NOTE - Follow-up Consult  Pharmacy Consult for Heparin and Coumadin Indication: atrial fibrillation  Allergies  Allergen Reactions  . Neosporin [Neomycin-Polymyxin-Gramicidin] Other (See Comments)    unknown    Patient Measurements: Height: 5\' 7"  (170.2 cm) Weight:  (unable to obtain, scale not working on bed) IBW/kg (Calculated) : 66.1 Heparin Dosing Weight: 104 kg (calculated based on admit weight)  Vital Signs: Temp: 98.8 F (37.1 C) (02/09 0741) Temp Source: Oral (02/09 0741) BP: 154/87 mmHg (02/09 0741) Pulse Rate: 103 (02/09 0815)  Labs:  Recent Labs  07/19/14 0355 07/19/14 0358 07/20/14 0418 07/21/14 0416  HGB 12.7*  --  12.0* 12.1*  HCT 42.6  --  41.0 41.5  PLT 246  --  211 221  LABPROT 16.3*  --  22.8* 26.9*  INR 1.29  --  2.00* 2.47*  HEPARINUNFRC  --  0.46 0.46 0.35  CREATININE  --   --   --  0.66    Estimated Creatinine Clearance: 122.9 mL/min (by C-G formula based on Cr of 0.66).  Assessment: 69yo male on IV heparin >> Coumadin for new afib. INR remains therapeutic today so heparin was discontinued.    Goal of Therapy:  INR 2-3 Monitor platelets by anticoagulation protocol: Yes   Plan:  Discontinue heparin and heparin levels Coumadin 5 mg po x 1 - Reduced dose based on recent INR increases. Monitor daily INR, CBC, s/sx of bleeding  Toys 'R' UsKimberly Jerimiah Wolman, Pharm.D., BCPS Clinical Pharmacist Pager [phone] 07/21/2014 11:39 AM

## 2014-07-21 NOTE — Progress Notes (Signed)
NUTRITION FOLLOW UP/CONSULT  Intervention:   48 hour Calorie Count ordered Monitor FEES results Monitor diet advancement/PO intake RD to continue to follow along; will provide diet education for weight loss when appropriate  Nutrition Dx:   Inadequate oral intake related to inability to eat as evidenced by NPO status; discontinued  Nutrition Dx:   Inadequate oral intake related to respiratory difficulty as evidenced by inability to tolerate prolonged trach collar.   Goal:   Enteral nutrition to provide 60-70% of estimated calorie needs (22-25 kcals/kg ideal body weight) and 100% estimated protein needs, based on ASPEN guidelines for hypocaloric, high protein feeding in critically ill individuals; discontinued  New Goal: Pt to meet >/= 90% of their estimated nutrition needs    Monitor:   TF re-initiation tolerance/adquacy, labs, weight trends, vent status  Assessment:   69yo man w/ PMHx of obstructive sleep apnea, obesity hypoventilation syndrome, GERD, Type 2 DM, and chronic hypercarbic respiratory failure on BiPAP at home who presented with worsening SOB for the past week PTA.    Pt failed extubation and re intubated 1/31.  Trach placed 2/2  RD consulted for assessment of nutrition status/requirements, calorie count, and diet education for weight loss.  Pt's diet was advanced to Heart Healthy on 2/5. Per nursing notes pt has consumed 25% of a few meals. Per chart, pt must wear PMV in order to eat.  Pt states that he has not been eating or drinking anything. He reports eating successfully while on trach collar before but, last time "person didn't do it right and I started choking". Per RN pt's oxygen went down to 80% last time he was on collar and eating; plan to re-attempt eating on trach collar with respiratory therapist today. Pt states he is hungry and thirsty but, he is not interested in receiving tube feedings. Diet education not appropriate at this time as pt is unable to  engage in conversation and is NPO. RD provided "Weight loss Tips" and "1800-Calorie 5-day Sample Menus" from the Academy of Nutrition and Dietetics. Encouraged pt to stop eating before getting full when he is allowed to eat.  RD contact information provided-Encouraged pt to page or call RD when PMV in use. Calorie Count initiated with envelope place on pt's door for meal ticket collection.    Height: Ht Readings from Last 1 Encounters:  07/17/14 5\' 7"  (1.702 m)   IBW: 148 lbs (67.3 kg)  Weight Status:   Wt Readings from Last 1 Encounters:  07/20/14 331 lb (150.141 kg)  07/11/14 338 lb  BMI: Body mass index is 52.99 kg/(m^2).  Re-estimated needs:  Kcal: 2200-2500 Protein: 115-130 grams Fluid: 3.3 L/day  Skin: closed incision on neck; +1 generalized edema  Diet Order: Diet heart healthy/carb modified   Intake/Output Summary (Last 24 hours) at 07/21/14 0935 Last data filed at 07/21/14 0800  Gross per 24 hour  Intake   1725 ml  Output      0 ml  Net   1725 ml     Last BM: 2/8   Labs:   Recent Labs Lab 07/17/14 0655 07/18/14 0309 07/21/14 0416  NA 151* 144 141  K 3.9 4.2 4.2  CL 109 104 101  CO2 35* 33* 31  BUN 14 17 11   CREATININE 0.74 0.70 0.66  CALCIUM 9.3 9.0 8.8  MG  --   --  2.1  PHOS  --   --  2.0*  GLUCOSE 120* 223* 111*    CBG (last 3)  Recent Labs  07/19/14 1233 07/19/14 1700 07/19/14 2055  GLUCAP 182* 151* 186*    Scheduled Meds: . antiseptic oral rinse  7 mL Mouth Rinse QID  . aspirin  81 mg Oral Daily  . brimonidine  1 drop Both Eyes BID  . budesonide  0.5 mg Nebulization BID  . chlorhexidine  15 mL Mouth Rinse BID  . dorzolamide-timolol  1 drop Both Eyes BID  . insulin aspart  0-15 Units Subcutaneous TID WC  . insulin glargine  40 Units Subcutaneous QHS  . ipratropium-albuterol  3 mL Nebulization Q6H  . latanoprost  1 drop Both Eyes QHS  . pantoprazole  40 mg Oral Q24H  . warfarin  5 mg Oral ONCE-1800  . warfarin   Does not  apply Once  . Warfarin - Pharmacist Dosing Inpatient   Does not apply q1800    Continuous Infusions: . sodium chloride Stopped (07/17/14 1114)  . sodium chloride 50 mL/hr at 07/21/14 0319    Ian Malkin RD, LDN Inpatient Clinical Dietitian Pager: [phone] After Hours Pager: [phone]

## 2014-07-21 NOTE — Progress Notes (Signed)
Occupational Therapy Treatment Patient Details Name: Blake Summers MRN: [id number] DOB: 1945/08/21 Today's Date: 07/21/2014    History of present illness [name] is a 69yo man w/ PMHx of obstructive sleep apnea, obesity hypoventilation syndrome, GERD, Type 2 DM, and chronic hypercarbic respiratory failure on BiPAP at home who presented with worsening SOB for the past week. Found to be in acute on chronic hypercarbic respiratory failure. Pt intubated 1/29 s/p trach 2/2   OT comments  Pt not able to tolerate ATC today, supported with ventilator. Focus of session on UE exercise with level 2 theraband. Tolerated well.  Follow Up Recommendations  SNF    Equipment Recommendations  None recommended by OT    Recommendations for Other Services      Precautions / Restrictions Precautions Precautions: Fall Precaution Comments: trach       Mobility Bed Mobility                  Transfers                      Balance                                   ADL                                                Vision                     Perception     Praxis      Cognition   Behavior During Therapy: WFL for tasks assessed/performed Overall Cognitive Status: Within Functional Limits for tasks assessed                       Extremity/Trunk Assessment               Exercises General Exercises - Upper Extremity Shoulder Flexion: Strengthening;Both;20 reps;Supine;Theraband Theraband Level (Shoulder Flexion): Level 2 (Red) Shoulder Extension: Strengthening;Both;20 reps;Supine;Theraband Theraband Level (Shoulder Extension): Level 2 (Red) Shoulder Horizontal ABduction: Strengthening;Both;20 reps;Supine;Theraband Theraband Level (Shoulder Horizontal Abduction): Level 2 (Red) Elbow Flexion: Strengthening;Both;20 reps;Supine;Theraband Theraband Level (Elbow Flexion): Level 2 (Red) Elbow Extension:  Strengthening;Both;20 reps;Supine;Theraband Theraband Level (Elbow Extension): Level 2 (Red)   Shoulder Instructions       General Comments      Pertinent Vitals/ Pain       Pain Assessment: No/denies pain  Home Living                                          Prior Functioning/Environment              Frequency Min 2X/week     Progress Toward Goals  OT Goals(current goals can now be found in the care plan section)  Progress towards OT goals: Progressing toward goals  Acute Rehab OT Goals Patient Stated Goal: return home with wife's assist Time For Goal Achievement: 07/31/14 Potential to Achieve Goals: Good  Plan Discharge plan needs to be updated    Co-evaluation                 End of Session Equipment Utilized  During Treatment: Other (comment) (vent)   Activity Tolerance Patient tolerated treatment well   Patient Left in bed;with call bell/phone within reach   Nurse Communication          Time: 1610-9604 OT Time Calculation (min): 20 min  Charges: OT General Charges $OT Visit: 1 Procedure OT Treatments $Therapeutic Exercise: 8-22 mins  Evern Bio 07/21/2014, 3:46 PM  [phone]

## 2014-07-21 NOTE — Progress Notes (Signed)
Pt unable to stay off ventilator as respiratory and nurse both attempted trach collar on 10 liters oxygen and pt stated he could not breath after approximately 3 minutes. Nurse noted at this time pt's oxygen saturation stayed in 90's for this short time. Respiratory observed his oxygen saturations drop below 90. Notified Dr. Craige Cottasood of this and notified him that nurse unable to give pills po at this time. Dr. Craige Cottasood stated we could hold pills for now and try trach collar again later. Will monitor.

## 2014-07-22 LAB — CBC
HEMATOCRIT: 40.6 % (ref 39.0–52.0)
HEMOGLOBIN: 12.2 g/dL — AB (ref 13.0–17.0)
MCH: 27.8 pg (ref 26.0–34.0)
MCHC: 30 g/dL (ref 30.0–36.0)
MCV: 92.5 fL (ref 78.0–100.0)
Platelets: 225 10*3/uL (ref 150–400)
RBC: 4.39 MIL/uL (ref 4.22–5.81)
RDW: 15.4 % (ref 11.5–15.5)
WBC: 7 10*3/uL (ref 4.0–10.5)

## 2014-07-22 LAB — GLUCOSE, CAPILLARY
GLUCOSE-CAPILLARY: 98 mg/dL (ref 70–99)
GLUCOSE-CAPILLARY: 71 mg/dL (ref 70–99)
Glucose-Capillary: 77 mg/dL (ref 70–99)
Glucose-Capillary: 75 mg/dL (ref 70–99)
Glucose-Capillary: 155 mg/dL — ABNORMAL HIGH (ref 70–99)

## 2014-07-22 LAB — BASIC METABOLIC PANEL
Anion gap: 7 (ref 5–15)
BUN: 8 mg/dL (ref 6–23)
CO2: 35 mmol/L — AB (ref 19–32)
Calcium: 8.8 mg/dL (ref 8.4–10.5)
Chloride: 102 mmol/L (ref 96–112)
Creatinine, Ser: 0.68 mg/dL (ref 0.50–1.35)
GFR calc non Af Amer: >90 mL/min (ref >90)
Glucose, Bld: 97 mg/dL (ref 70–99)
Potassium: 3.8 mmol/L (ref 3.5–5.1)
SODIUM: 144 mmol/L (ref 135–145)

## 2014-07-22 LAB — PROTIME-INR
INR: 2.5 — ABNORMAL HIGH (ref 0.00–1.49)
Prothrombin Time: 27.2 seconds — ABNORMAL HIGH (ref 11.6–15.2)

## 2014-07-22 MED ORDER — ASPIRIN 81 MG PO CHEW
81.0000 mg | CHEWABLE_TABLET | Freq: Every day | ORAL | Status: DC
Start: 2014-07-22 — End: 2015-09-28

## 2014-07-22 MED ORDER — DILTIAZEM HCL 60 MG PO TABS: 60.0000 mg | ORAL_TABLET | Freq: Three times a day (TID) | ORAL | Status: DC

## 2014-07-22 MED ORDER — WARFARIN SODIUM 5 MG PO TABS
5.0000 mg | ORAL_TABLET | Freq: Once | ORAL | Status: AC
Start: 2014-07-22 — End: 2014-07-22
  Administered 2014-07-22: 5 mg via ORAL
  Filled 2014-07-22: qty 1

## 2014-07-22 MED ORDER — INSULIN GLARGINE 100 UNIT/ML ~~LOC~~ SOLN
40.0000 [IU] | Freq: Every day | SUBCUTANEOUS | Status: DC
Start: 2014-07-22 — End: 2014-10-12

## 2014-07-22 MED ORDER — OXYCODONE-ACETAMINOPHEN 5-325 MG PO TABS: 1.0000 | ORAL_TABLET | ORAL | Status: DC | PRN

## 2014-07-22 MED ORDER — ALBUTEROL SULFATE (2.5 MG/3ML) 0.083% IN NEBU: 2.5000 mg | INHALATION_SOLUTION | RESPIRATORY_TRACT | Status: DC | PRN

## 2014-07-22 MED ORDER — PRAVASTATIN SODIUM 40 MG PO TABS: 40.0000 mg | ORAL_TABLET | Freq: Every day | ORAL | Status: DC

## 2014-07-22 MED ORDER — PANTOPRAZOLE SODIUM 40 MG PO TBEC
40.0000 mg | DELAYED_RELEASE_TABLET | ORAL | Status: DC
Start: 2014-07-22 — End: 2014-11-04

## 2014-07-22 MED ORDER — INSULIN ASPART 100 UNIT/ML ~~LOC~~ SOLN: 0.0000 [IU] | Freq: Three times a day (TID) | SUBCUTANEOUS | Status: DC

## 2014-07-22 NOTE — Progress Notes (Signed)
ANTICOAGULATION CONSULT NOTE - Follow-up Consult  Pharmacy Consult for Coumadin Indication: atrial fibrillation  Allergies  Allergen Reactions  . Neosporin [Neomycin-Polymyxin-Gramicidin] Other (See Comments)    unknown    Patient Measurements: Height: 5\' 7"  (170.2 cm) Weight:  (unable to obtain, scale not working on bed) IBW/kg (Calculated) : 66.1 Heparin Dosing Weight: 104 kg (calculated based on admit weight)  Vital Signs: Temp: 99.1 F (37.3 C) (02/10 1133) Temp Source: Oral (02/10 1133) BP: 139/103 mmHg (02/10 1133) Pulse Rate: 71 (02/10 1133)  Labs:  Recent Labs  07/20/14 0418 07/21/14 0416 07/22/14 0414  HGB 12.0* 12.1* 12.2*  HCT 41.0 41.5 40.6  PLT 211 221 225  LABPROT 22.8* 26.9* 27.2*  INR 2.00* 2.47* 2.50*  HEPARINUNFRC 0.46 0.35  --   CREATININE  --  0.66 0.68    Estimated Creatinine Clearance: 122.9 mL/min (by C-G formula based on Cr of 0.68).  Assessment: 69yo male continues on coumadin however, pt has not received warfarin the last 2 nights due to inability to take PO. Attempted to contact provider to clarify if pt can take PO meds but was unsuccessful. CBC and platelets remain stable and INR is at goal at 2.5. Will order warfarin for tonight but the patient ability to take PO meds needs to be address. May nee to consider a parenteral anticoagulant when INR drops below therapeutic.   Goal of Therapy:  INR 2-3 Monitor platelets by anticoagulation protocol: Yes   Plan:  1. Re-attempt warfarin 5mg  PO x 1 tonight 2. F/u ability to take PO meds  3. F/u AM INR  Lysle Pearlachel Landan Fedie, PharmD, BCPS Pager # [phone] 07/22/2014 3:18 PM

## 2014-07-22 NOTE — Progress Notes (Signed)
eLink Physician-Brief Progress Note Patient Name: Blake Summers DOB: 13-Nov-1945 MRN: [id number]   Date of Service  07/22/2014  HPI/Events of Note  Call from nurse that during CVL dressing change the area around CVL noted to be reddened with drainage at site.  No current need for CVL.  eICU Interventions  Plan: D/C CVL PIVs for IV access     Intervention Category Intermediate Interventions: Infection - evaluation and management  DETERDING,ELIZABETH 07/22/2014, 12:08 AM

## 2014-07-22 NOTE — Progress Notes (Signed)
Called [facility] for possible antidiarreal. Spoke with Dr. Bard HerbertYacob who will have MD's f/u in the morning.

## 2014-07-22 NOTE — Procedures (Signed)
Objective Swallowing Evaluation: Fiberoptic Endoscopic Evaluation of Swallowing  Patient Details  Name: Blake Summers MRN: [id number] Date of Birth: [birth year]  Today's Date: 07/22/2014 Time: SLP Start Time (ACUTE ONLY): 1510-SLP Stop Time (ACUTE ONLY): 1600 SLP Time Calculation (min) (ACUTE ONLY): 50 min  Past Medical History:  Past Medical History  Diagnosis Date  . GERD 07/06/2010  . Edema 10/28/2007  . DIABETES MELLITUS, TYPE II 05/04/2007  . HYPERLIPIDEMIA 07/08/2008  . ALLERGIC RHINITIS 07/08/2008  . GLAUCOMA 07/08/2008  . CEREBROVASCULAR ACCIDENT, HX OF 07/08/2008  . OBSTRUCTIVE SLEEP APNEA 03/06/2008  . DIABETIC ULCER, LEFT LEG 08/23/2009  . 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 HYPERTENSION 10/28/2007  . VENOUS INSUFFICIENCY 03/08/2009  . FATTY LIVER DISEASE 05/19/2008  . BENIGN PROSTATIC HYPERTROPHY 07/08/2008  . 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 NEPHROLITHIASIS, HX OF 07/08/2008  . Morbid obesity   . DM nephropathy/sclerosis   . 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 Lumbar spondylosis   . Diarrhea 06/08/2013   Past Surgical History:  Past Surgical History  Procedure Laterality Date  . Lithotripsy  2007  . Tracheostomy tube placement N/A 07/14/2014    Procedure: TRACHEOSTOMY;  Surgeon: Darletta Moll, MD;  Location: [facility] OR;  Service: ENT;  Laterality: N/A;   HPI:  HPI: Mr. 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 Found to be in acute on chronic hypercarbic respiratory failure. Pt intubated 1/29 s/p trach 2/2. PMV eval  2/4; FEES 2/5; started on regular diet, thin liquids; returned to full vent support; difficult wean to ATC.    No Data Recorded  Assessment / Plan / Recommendation CHL IP CLINICAL IMPRESSIONS 07/22/2014  Dysphagia Diagnosis Mild pharyngeal phase dysphagia   Clinical impression FEES completed with swallow assessed under two conditions: reclined in bed on full vent support with cuff inflated; upright in bed with in-line PMV in place.  On vent (PRVC), pt consumed ice chips/water from straw with premature spillage into pharynx, but no penetration nor aspiration. Standing secretions were present; unable to be cleared due to no upper airway access.  With in-line PMV in place, pt was able to clear secretions, cough, and phonate.  There remained premature spillage of liquids into the larynx, but no penetration nor aspiration of thin liquids despite large, successive boluses.  There was functional mastication of solids with normal passage through UES.    RECOMMEND: 1) Regular diet, thin liquids, meds whole in puree only when pt is using in-line PMV; 2) allow sips of water/ice chips when pt is on vent support with cuff inflated; no solid foods under this condition.   Extensive education completed with family re: swallowing precautions, circumstances under which he is safe to eat.  Pt and family verbalized understanding.        CHL IP TREATMENT RECOMMENDATION 07/22/2014  Treatment Plan Recommendations Therapy as outlined in treatment plan below     CHL IP DIET RECOMMENDATION 07/22/2014  Diet Recommendations Regular;Thin liquid  Liquid Administration via Straw;Cup  Medication Administration Whole meds with puree  Compensations Slow rate;Small sips/bites  Postural Changes and/or Swallow Maneuvers Seated upright 90 degrees     CHL IP OTHER RECOMMENDATIONS 07/22/2014  Recommended Consults (None)  Oral Care Recommendations Oral care BID  Other Recommendations (None)     CHL IP FOLLOW UP RECOMMENDATIONS 07/22/2014  Follow up Recommendations LTACH     CHL IP FREQUENCY AND DURATION 07/22/2014  Speech Therapy Frequency (ACUTE ONLY) min 1 x/week  Treatment Duration 1 week     Pertinent Vitals/Pain     SLP Swallow Goals No flowsheet data found.  No flowsheet  data found.    CHL IP REASON FOR REFERRAL 07/22/2014  Reason for Referral Objectively evaluate swallowing function     CHL IP ORAL PHASE 07/22/2014  Lips (None)  Tongue (None)  Mucous membranes (None)  Nutritional status (None)  Other (None)  Oxygen therapy (None)  Oral Phase WFL  Oral - Pudding Teaspoon (None)  Oral - Pudding Cup (None)  Oral - Honey Teaspoon (None)  Oral - Honey Cup (None)  Oral - Honey Syringe (None)  Oral - Nectar Teaspoon (None)  Oral - Nectar Cup (None)  Oral - Nectar Straw (None)  Oral - Nectar Syringe (None)  Oral - Ice Chips (None)  Oral - Thin Teaspoon (None)  Oral - Thin Cup (None)  Oral - Thin Straw (None)  Oral - Thin Syringe (None)  Oral - Puree (None)  Oral - Mechanical Soft (None)  Oral - Regular (None)  Oral - Multi-consistency (None)  Oral - Pill (None)  Oral Phase - Comment (None)      CHL IP PHARYNGEAL PHASE 07/22/2014  Pharyngeal Phase Impaired  Pharyngeal - Pudding Teaspoon (None)  Penetration/Aspiration details (pudding teaspoon) (None)  Pharyngeal - Pudding Cup (None)  Penetration/Aspiration details (pudding cup) (None)  Pharyngeal - Honey Teaspoon (None)  Penetration/Aspiration details (honey teaspoon) (None)  Pharyngeal - Honey Cup (None)  Penetration/Aspiration details (honey cup) (None)  Pharyngeal - Honey Syringe (None)  Penetration/Aspiration details (honey syringe) (None)  Pharyngeal - Nectar Teaspoon (None)  Penetration/Aspiration details (nectar teaspoon) (None)  Pharyngeal - Nectar Cup (None)  Penetration/Aspiration details (nectar cup) (None)  Pharyngeal - Nectar Straw (None)  Penetration/Aspiration details (nectar straw) (None)  Pharyngeal - Nectar Syringe (None)  Penetration/Aspiration details (nectar syringe) (None)  Pharyngeal - Ice Chips (None)  Penetration/Aspiration details (ice chips) (None)  Pharyngeal - Thin Teaspoon (None)  Penetration/Aspiration details (thin teaspoon) (None)  Pharyngeal - Thin Cup  (None)  Penetration/Aspiration details (thin cup) (None)  Pharyngeal - Thin Straw Premature spillage to pyriform sinuses  Penetration/Aspiration details (thin straw) (None)  Pharyngeal - Thin Syringe (None)  Penetration/Aspiration details (thin syringe') (None)  Pharyngeal - Puree WFL;Penetration/Aspiration during swallow  Penetration/Aspiration details (puree) Material enters airway, remains ABOVE vocal cords then ejected out  Pharyngeal - Mechanical Soft (None)  Penetration/Aspiration details (mechanical soft) (None)  Pharyngeal - Regular WFL  Penetration/Aspiration details (regular) (None)  Pharyngeal - Multi-consistency (None)  Penetration/Aspiration details (multi-consistency) (None)  Pharyngeal - Pill (None)  Penetration/Aspiration details (pill) (None)  Pharyngeal Comment (None)     No flowsheet data found.  No flowsheet data found.         Blenda MountsCouture, Ildefonso Keaney Laurice 07/22/2014, 5:36 PM

## 2014-07-22 NOTE — Progress Notes (Signed)
Subjective: On vent.  Venting well.  Objective: Vital signs in last 24 hours: Temp:  [98.6 F (37 C)-99.4 F (37.4 C)] 99.4 F (37.4 C) (02/10 0722) Pulse Rate:  [75-128] 99 (02/10 0722) Resp:  [14-22] 17 (02/10 0722) BP: (140-167)/(62-126) 140/79 mmHg (02/10 0722) SpO2:  [95 %-100 %] 99 % (02/10 0722) FiO2 (%):  [50 %] 50 % (02/10 0400)  PHYSICAL EXAMINATION: General: obese, awake, on vent. Neuro: no focal deficits, eyes open HEENT: [place]/AT [place]/OC: Normal mucosa. No lesion. Neck: Trach midline, in place. Ext: 1-2+ pitting edema in lower extremities Skin: chronic venous skin changes in lower extremities, erythema    Recent Labs  07/21/14 0416 07/22/14 0414  WBC 6.3 7.0  HGB 12.1* 12.2*  HCT 41.5 40.6  PLT 221 225    Recent Labs  07/21/14 0416 07/22/14 0414  NA 141 144  K 4.2 3.8  CL 101 102  CO2 31 35*  GLUCOSE 111* 97  BUN 11 8  CREATININE 0.66 0.68  CALCIUM 8.8 8.8    Medications:  I have reviewed the patient's current medications. Scheduled: . antiseptic oral rinse  7 mL Mouth Rinse QID  . aspirin  81 mg Oral Daily  . brimonidine  1 drop Both Eyes BID  . chlorhexidine  15 mL Mouth Rinse BID  . diltiazem  60 mg Oral 3 times per day  . dorzolamide-timolol  1 drop Both Eyes BID  . insulin aspart  0-15 Units Subcutaneous TID WC  . insulin glargine  40 Units Subcutaneous QHS  . latanoprost  1 drop Both Eyes QHS  . pantoprazole  40 mg Oral Q24H  . pravastatin  40 mg Oral q1800  . warfarin  5 mg Oral ONCE-1800  . Warfarin - Pharmacist Dosing Inpatient   Does not apply q1800   ZOX:WRUEAVPRN:sodium chloride, albuterol, fentaNYL, hydrALAZINE, metoprolol, oxyCODONE-acetaminophen  Assessment/Plan: POD#8 s/p trach. Back on vent. No trach issues. Due to his difficult airway, will leave fresh trach in place for 2 weeks. Will change trach early next week.   LOS: 12 days   Tieshia Rettinger,SUI W 07/22/2014, 8:24 AM

## 2014-07-22 NOTE — Discharge Summary (Signed)
Physician Discharge Summary  Patient ID: Blake Summers MRN: [id number] DOB/AGE: [birth year] 69 y.o.  Admit date: 07/10/2014 Discharge date: 07/23/2014    Discharge Diagnoses:  Principal Problem:   Acute on chronic respiratory failure Active Problems:   HYPOPITUITARISM   Obstructive sleep apnea   Obesity hypoventilation syndrome   DM (diabetes mellitus)   Diabetic ulcer of both lower extremities   CAP (community acquired pneumonia)   Acute on chronic respiratory failure with hypercapnia    Brief Summary: Blake Summers is a 69 y.o. y/o male with a PMH of 69yo obese male with hx OSA/OHS, DM, chronic hypercarbic resp failure on bipap at home initially presented 1/29 with 1 week hx SOB.   He was admitted with acute on chronic mixed respiratory failure r/t moraxella tracheobronchitis and decompensated OSA/OHS and quickly required intubation.  He was treated with bronchodilators, supplemental O2 and abx.   CTA chest was neg for PE and showed only interstitial thickening likely from CHF. He improved quickly and had trial extubation 1/31 but failed and was reintubated the same day and ultimately had trach placed 2/3 by ENT.  After trach placement pt weaned well on vent and has tolerated intermittent ATC but will likely always need nocturnal vent.  Course was c/b Afib with RVR, DM and nutritional needs.  Pt was followed closely by speech therapy and, despite only intermittent ATC, he passed FEES study while on vent.  ST has recommended regular diet with thin liquids with in-line PMV.  Pt is much improved, will need continued weaning efforts and ongoing nocturnal ventilation.  He is medically cleared and ready for d/c to vent SNF.   MAJOR EVENTS: 1/29 Admit, VDRF 1/30 A fib, started heparin gtt 1/31 Extubated >> re-intubated 2/03 Trach by ENT  TEST RESULTS: 1/29 CT Angio Chest >> interstitial thickening likely from CHF, small Rt effusion FEES 2/10>>> passed on vent   INDWELLING  DEVICES:: ETT 1/29 >> trach XL #6 2/2 >>  MICRO DATA: Resp 1/30 >> moraxella  ANTIMICROBIALS:  Unasyn 1/31 >>> 2/6                                                                   D/c plan by Discharge Diagnosis  Acute on chronic respiratory failure 2nd to OSA/OHS. S/p tracheostomy. Plan: - cont weaning efforts - needs optimal body positioning for weaning  - goal ATC daytime with mandatory vent support at night - PRN BD  - f/u CXR as needed - PM valve as tol with ongoing speech therapy   A fib with RVR >> rate controlled. Hx of HTN, HLD. Plan: - continue ASA - coumadin per pharmacy -- INR sub therapeutic r/t inability to take PO's prior to FEES - will d/c on 77m daily.  Will need close INR monitoring and dose adjustment.  - cont cardizem, pravastatin  - ongoing assessment of need for lasix   Hypokalemia , hypophosphatemia. Hypernatremia - resolved P:  - F/u chem and replace electrolytes as needed  Hx of GERD. Morbid obesity. Dysphagia. P:  - Nutrition to assess diet adjustment for weight loss - Continue protonix - 1) Regular diet, thin liquids, meds whole in puree only when pt is using in-line PMV.   2) allow sips of water/ice chips  when pt is on vent support with cuff inflated; no solid foods under this condition. - goal would still be daytime ATC with PMV and PO nutrition   Type 2 DM. Plan: - SSI with lantus  Chronic pain. Deconditioning. Plan: - OOB, PT/OT - limit narcotics as tolerated  Moraxella tracheobronchitis >> completed abx 2/07.    Filed Vitals:   07/23/14 0351 07/23/14 0500 07/23/14 0600 07/23/14 0746  BP: 147/99  123/83 142/76  Pulse: 78 86 72 87  Temp: 98.3 F (36.8 C)   98.6 F (37 C)  TempSrc: Oral   Oral  Resp: _0 Height:      Weight:  707 lb 10.8 oz (321 kg)    SpO2: 99% 99% 98% 97%     Discharge Labs  BMET  Recent Labs Lab 07/17/14 0655 07/18/14 0309 07/21/14 0416 07/22/14 0414  NA 151* 144 141 144   K 3.9 4.2 4.2 3.8  CL 109 104 101 102  CO2 35* 33* 31 35*  GLUCOSE 120* 223* 111* 97  BUN _1 CREATININE 0.74 0.70 0.66 0.68  CALCIUM 9.3 9.0 8.8 8.8  MG  --   --  2.1  --   PHOS  --   --  2.0*  --      CBC   Recent Labs Lab 07/21/14 0416 07/22/14 0414 07/23/14 0417  HGB 12.1* 12.2* 12.8*  HCT 41.5 40.6 42.2  WBC 6.3 7.0 8.1  PLT 221 225 242   Anti-Coagulation  Recent Labs Lab 07/19/14 0355 07/20/14 0418 07/21/14 0416 07/22/14 0414 07/23/14 0417  INR 1.29 2.00* 2.47* 2.50* 1.86*          Follow-up Information    Follow up with Renato Shin, MD.   Specialty:  Endocrinology   Contact information:   [address] [place] [address] [phone]       Follow up with Ascencion Dike, MD. Schedule an appointment as soon as possible for a visit in 2 weeks.   Specialty:  Otolaryngology   Why:  trach follow up    Contact information:   [address] [place] [address] [phone]          Medication List    STOP taking these medications        allopurinol 300 MG tablet  Commonly known as:  ZYLOPRIM     BAYER CONTOUR NEXT MONITOR W/DEVICE Kit     BAYER CONTOUR NEXT TEST test strip  Generic drug:  glucose blood     CLARITIN-D 12 HOUR 5-120 MG per tablet  Generic drug:  loratadine-pseudoephedrine     clobetasol ointment 0.05 %  Commonly known as:  TEMOVATE     clotrimazole 10 MG troche  Commonly known as:  MYCELEX     cyanocobalamin 1000 MCG/ML injection  Commonly known as:  (VITAMIN B-12)     dexlansoprazole 60 MG capsule  Commonly known as:  DEXILANT     diltiazem 180 MG 24 hr capsule  Commonly known as:  CARDIZEM CD     dipyridamole-aspirin 200-25 MG per 12 hr capsule  Commonly known as:  AGGRENOX     docusate sodium 100 MG capsule  Commonly known as:  COLACE     furosemide 80 MG tablet  Commonly known as:  LASIX     Insulin NPH (Human) (Isophane) 100 UNIT/ML Kiwkpen  Commonly known  as:  Alyse Low  lactulose 10 GM/15ML solution  Commonly known as:  CHRONULAC     losartan-hydrochlorothiazide 100-25 MG per tablet  Commonly known as:  HYZAAR     lovastatin 40 MG tablet  Commonly known as:  MEVACOR     methocarbamol 500 MG tablet  Commonly known as:  ROBAXIN     morphine 15 MG 12 hr tablet  Commonly known as:  MS CONTIN     naproxen sodium 220 MG tablet  Commonly known as:  ANAPROX     NOVOFINE 30G X 8 MM Misc  Generic drug:  Insulin Pen Needle     NOVOLOG FLEXPEN 100 UNIT/ML FlexPen  Generic drug:  insulin aspart  Replaced by:  insulin aspart 100 UNIT/ML injection     oxyCODONE-acetaminophen 7.5-325 MG per tablet  Commonly known as:  PERCOCET  Replaced by:  oxyCODONE-acetaminophen 5-325 MG per tablet     phenol 1.4 % Liqd  Commonly known as:  CHLORASEPTIC     polyethylene glycol powder powder  Commonly known as:  GLYCOLAX/MIRALAX     potassium chloride SA 20 MEQ tablet  Commonly known as:  K-DUR,KLOR-CON     PROVENTIL HFA 108 (90 BASE) MCG/ACT inhaler  Generic drug:  albuterol  Replaced by:  albuterol (2.5 MG/3ML) 0.083% nebulizer solution     ranitidine 150 MG tablet  Commonly known as:  ZANTAC     topiramate 50 MG tablet  Commonly known as:  TOPAMAX     zolpidem 10 MG tablet  Commonly known as:  AMBIEN      TAKE these medications        albuterol (2.5 MG/3ML) 0.083% nebulizer solution  Commonly known as:  PROVENTIL  Take 3 mLs (2.5 mg total) by nebulization every 3 (three) hours as needed for wheezing or shortness of breath.     aspirin 81 MG chewable tablet  Chew 1 tablet (81 mg total) by mouth daily.     brimonidine 0.15 % ophthalmic solution  Commonly known as:  ALPHAGAN  Place 1 drop into both eyes 2 (two) times daily.     diclofenac sodium 1 % Gel  Commonly known as:  VOLTAREN  APPLY TOPICALLY 4 TIMES DAILY AS PHYSICIAN INSTRUCTED     diltiazem 60 MG tablet  Commonly known as:  CARDIZEM  Take 1 tablet (60 mg  total) by mouth every 8 (eight) hours.     dorzolamide-timolol 22.3-6.8 MG/ML ophthalmic solution  Commonly known as:  COSOPT  Place 1 drop into both eyes 2 (two) times daily.     insulin aspart 100 UNIT/ML injection  Commonly known as:  novoLOG  Inject 0-15 Units into the skin 3 (three) times daily with meals.     insulin glargine 100 UNIT/ML injection  Commonly known as:  LANTUS  Inject 0.4 mLs (40 Units total) into the skin at bedtime.     ketoconazole 2 % shampoo  Commonly known as:  NIZORAL  Apply 1 application topically once a week.     latanoprost 0.005 % ophthalmic solution  Commonly known as:  XALATAN  Place 1 drop into both eyes at bedtime.     lidocaine 5 %  Commonly known as:  LIDODERM  Place 1-3 patches onto the skin daily. Remove & Discard patch within 12 hours or as directed by MD     oxyCODONE-acetaminophen 5-325 MG per tablet  Commonly known as:  PERCOCET/ROXICET  Take 1 tablet by mouth every 4 (four) hours as needed for moderate pain.  pantoprazole 40 MG tablet  Commonly known as:  PROTONIX  Take 1 tablet (40 mg total) by mouth daily.     pravastatin 40 MG tablet  Commonly known as:  PRAVACHOL  Take 1 tablet (40 mg total) by mouth daily at 6 PM.     warfarin 5 MG tablet  Commonly known as:  COUMADIN  Take 1 tablet (5 mg total) by mouth daily.          Disposition: 01-Home or Self Care  Discharged Condition: Blake Summers has met maximum benefit of inpatient care and is medically stable and cleared for discharge.  Patient is pending follow up as above.      Time spent on disposition:  Greater than 35 minutes.   SignedDarlina Sicilian, NP 07/23/2014  9:38 AM Pager: [phone] or [phone]      Chesley Mires, MD [place] 07/23/2014, 11:33 AM Pager:  [phone] After 3pm call: [phone]

## 2014-07-22 NOTE — Clinical Social Work Note (Signed)
CSW spoke with Dr- DC anticipated for tomorrow 07/23/14- facility is aware and still able to admit patient then, patient and family aware.  CSW will continue to follow.  Merlyn LotJenna Holoman, LCSWA Clinical Social Worker [phone]

## 2014-07-22 NOTE — Evaluation (Signed)
[facility] Speaking Valve - Evaluation Patient Details  Name: Blake Summers MRN: [id number] Date of Birth: [birth year]  Today's Date: 07/22/2014 Time: 0865-7846 SLP Time Calculation (min) (ACUTE ONLY): 45 min  Past Medical History:  Past Medical History  Diagnosis Date  . GERD 07/06/2010  . Edema 10/28/2007  . DIABETES MELLITUS, TYPE II 05/04/2007  . HYPERLIPIDEMIA 07/08/2008  . ALLERGIC RHINITIS 07/08/2008  . GLAUCOMA 07/08/2008  . CEREBROVASCULAR ACCIDENT, HX OF 07/08/2008  . OBSTRUCTIVE SLEEP APNEA 03/06/2008  . DIABETIC ULCER, LEFT LEG 08/23/2009  . 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 HYPERTENSION 10/28/2007  . VENOUS INSUFFICIENCY 03/08/2009  . FATTY LIVER DISEASE 05/19/2008  . BENIGN PROSTATIC HYPERTROPHY 07/08/2008  . 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 NEPHROLITHIASIS, HX OF 07/08/2008  . Morbid obesity   . DM nephropathy/sclerosis   . 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 Lumbar spondylosis   . Diarrhea 06/08/2013   Past Surgical History:  Past Surgical History  Procedure Laterality Date  . Lithotripsy  2007  . Tracheostomy tube placement N/A 07/14/2014    Procedure: TRACHEOSTOMY;  Surgeon: Darletta Moll, MD;  Location: [facility] OR;  Service: ENT;  Laterality: N/A;   HPI:  Blake Summers is a 69yo man w/ PMHx of obstructive sleep apnea, obesity hypoventilation syndrome, GERD, Type 2 DM, and chronic hypercarbic respiratory failure on BiPAP at home who presented with worsening SOB for the past week. Found to be in acute on chronic hypercarbic respiratory failure. Pt intubated 1/29 s/p trach 2/2. PMV eval  2/4; FEES 2/5; started on regular diet, thin liquids; returned to full vent support; difficult wean to ATC.     Assessment / Plan / Recommendation Clinical Impression  Pt demonstrated signs of patent upper airway upon cuff deflation, including adequate drop in expiratory tidal volume (>50%) and audible phonation. Peak pressure with drop from 35 to 25. In-line PMV was  placed in conjunction with RT for ~90 minutes with great tolerance. To compensate for air leak, PEEP was reduced from 5 to 0 and tidal volume was increased to 600, returning peak pressure to baseline level. Throughout trial, pt was able to communicate at the conversational level with adequate breath support and volume for clear intelligibility. Valve was in place throughout PO intake for FEES as well as for mobility, transfering bed>chair with PT. Education was provided to patient and family with model, in anticipation of d/c to [facility]. Per discussion with RT and RN, in-line valve was left in place and patient was encouraged to request removal when he begins to fatigue. Recommend for patient to continue to wear PMV throughout waking hours as tolerated, building up his endurance to wear throughout the day. PMV must be worn during meals. SLP to continue to follow as he remains in house.    SLP Assessment  Patient needs continued Speech Lanaguage Pathology Services    Follow Up Recommendations  LTACH    Frequency and Duration min 2x/week  1 week   Pertinent Vitals/Pain WNL as described above    SLP Goals Potential to Achieve Goals (ACUTE ONLY): Good   PMSV Trial  PMSV was placed for: ~90 min (and left in place) Able to redirect subglottic air through upper airway: Yes Able to Attain Phonation: Yes Voice Quality: Normal Able to Expectorate Secretions: No attempts Level of Secretion Expectoration with PMSV: Not observed Breath Support for Phonation: Adequate Intelligibility: Intelligible Respirations During Trial: 18 SpO2 During Trial: 98 % Pulse During Trial: 86 Behavior: Alert;Controlled;Cooperative;Expresses  self well   Tracheostomy Tube       Vent Dependency  Vent Mode: PRVC Set Rate: 18 bmp PEEP: 0 cmH20 (decreased for PMV trials) FiO2 (%): 50 % Vt Set: 600 mL (increased for PMV trials with speech therpay )    Cuff Deflation Trial Tolerated Cuff Deflation: Yes Length of Time  for Cuff Deflation Trial: 90 min Behavior: Alert;Controlled;Cooperative    Maxcine HamLaura Paiewonsky, M.A. CCC-SLP [phone]  Maxcine Hamaiewonsky, Graclynn Vanantwerp 07/22/2014, 5:19 PM

## 2014-07-22 NOTE — Progress Notes (Signed)
Calorie Count Note  48 hour calorie count ordered.  Diet: Heart Healthy/Carb Modified (NPO while on vent support) Supplements: None  Breakfast: 0 kcal, 0 grams of protein Lunch: 0 kcal, 0 grams of protein Dinner: 0 kcal, 0 grams of protein Supplements: NA  Total intake: 0 kcal  0 protein   Pt was unable to tolerate being off vent with or without speaking valve yesterday; therefore, pt had no PO intake yesterday. RD to continue to monitor.   Nutrition Dx: Inadequate oral intake related to respiratory difficulty as evidenced by inability to tolerate prolonged trach collar; ongoing  Goal: Pt to meet >/= 90% of their estimated nutrition needs; unmet   Intervention: Continue Calorie Count  If pt is unable to tolerate PO intake within 24 hours, recommend placing NGT for short term nutrition support. Recommend the following TF regimen: Initiate Glucerna 1.2 @ 20 ml/hr via OGT and increase by 10 ml every 4 hours to goal rate of 80 ml/hr. 30 ml Pro-stat once daily. Tube feeding regimen provides 2404 kcal (100% of needs), 130 grams of protein, and 1555 ml of H2O.    Ian Malkineanne Barnett RD, LDN Inpatient Clinical Dietitian Pager: [phone] After Hours Pager: [phone]

## 2014-07-22 NOTE — Progress Notes (Signed)
PULMONARY / CRITICAL CARE MEDICINE   Name: Kerin SalenJoseph L Wagar MRN: [id number] DOB: January 25, 1946    ADMISSION DATE:  07/10/2014 CONSULTATION DATE:  07/10/14  REFERRING MD :  Dr. Gonzella Lexhungel  CHIEF COMPLAINT:  Shortness of breath  INITIAL PRESENTATION:  69 yo male former smoker presented with progressive dyspnea with A on C hypoxic/hypercapnic respiratory failure from tracheobronchitis in setting of OSA/OHS.  MAJOR EVENTS: 1/29 Admit, VDRF 1/30 A fib, started heparin gtt 1/31 Extubated >> re-intubated 2/03 Trach by ENT  TEST RESULTS: 1/29 CT Angio Chest >> interstitial thickening likely from CHF, small Rt effusion  INDWELLING DEVICES:: ETT 1/29 >> trach XL #6 2/2 >>  MICRO DATA: Resp 1/30  >> moraxella  ANTIMICROBIALS:  Unasyn 1/31 >>> 2/6   SUBJECTIVE:    Not tol ATC this am.    VITAL SIGNS: Temp:  [98.6 F (37 C)-99.4 F (37.4 C)] 99.1 F (37.3 C) (02/10 1133) Pulse Rate:  [71-128] 71 (02/10 1133) Resp:  [14-21] 21 (02/10 1133) BP: (139-167)/(62-103) 139/103 mmHg (02/10 1133) SpO2:  [96 %-100 %] 100 % (02/10 1133) FiO2 (%):  [50 %] 50 % (02/10 1130) HEMODYNAMICS:   Vent Mode:  [-] PRVC FiO2 (%):  [50 %] 50 % Set Rate:  [18 bmp] 18 bmp Vt Set:  [500 mL] 500 mL PEEP:  [5 cmH20] 5 cmH20 Plateau Pressure:  [16 cmH20-26 cmH20] 16 cmH20 INTAKE / OUTPUT:  Intake/Output Summary (Last 24 hours) at 07/22/14 1440 Last data filed at 07/22/14 0950  Gross per 24 hour  Intake    200 ml  Output   2255 ml  Net  -2055 ml    PHYSICAL EXAMINATION: General: pleasant Neuro: normal strength HEENT: [place]/AT, tstomy XL #6 Cardiovascular: irreg , no murmurs  Lungs: decreased BL, unlabored on vent Abdomen: obese, soft Ext: 1+ pitting edema in lower extremities Skin: chronic venous skin changes in lower extremities, erythema    LABS: CMP Latest Ref Rng 07/22/2014 07/21/2014 07/18/2014  Glucose 70 - 99 mg/dL 97 409(W111(H) 119(J223(H)  BUN 6 - 23 mg/dL 8 11 17   Creatinine 0.50 - 1.35 mg/dL 4.780.68  2.950.66 6.210.70  Sodium 135 - 145 mmol/L 144 141 144  Potassium 3.5 - 5.1 mmol/L 3.8 4.2 4.2  Chloride 96 - 112 mmol/L 102 101 104  CO2 19 - 32 mmol/L 35(H) 31 33(H)  Calcium 8.4 - 10.5 mg/dL 8.8 8.8 9.0  Total Protein 6.0 - 8.3 g/dL - - -  Total Bilirubin 0.3 - 1.2 mg/dL - - -  Alkaline Phos 39 - 117 U/L - - -  AST 0 - 37 U/L - - -  ALT 0 - 53 U/L - - -    CBC Latest Ref Rng 07/22/2014 07/21/2014 07/20/2014  WBC 4.0 - 10.5 K/uL 7.0 6.3 7.5  Hemoglobin 13.0 - 17.0 g/dL 12.2(L) 12.1(L) 12.0(L)  Hematocrit 39.0 - 52.0 % 40.6 41.5 41.0  Platelets 150 - 400 K/uL 225 221 211    CBG (last 3)   Recent Labs  07/21/14 2209 07/22/14 0723 07/22/14 1131  GLUCAP 111* 71 77      ASSESSMENT / PLAN:  Acute on chronic respiratory failure 2nd to OSA/OHS. S/p tracheostomy. Plan: - try to push trach collar during the day -- ?positioning issue.  RT and PT to attempt wean again while OOB  - will need vent support at night - PRN BD  - f/u CXR, ABG as needed - speech to assess for PM valve  A fib with RVR >> rate  controlled. Hx of HTN, HLD. Plan: - continue ASA - coumadin per pharmacy - hold outpt hyzaar - restart lovastatin (pravastatin while in hospital), cardizem   Hypokalemia , hypophosphatemia. Hypernatremia - resolved P:   - F/u and replace electrolytes as needed  Hx of GERD. Morbid obesity. Dysphagia. P:   - Nutrition to assess diet adjustment for weight loss - Continue protonix - ? FEES - if remains unable to consistently tolerate ATC daytime, will need alternative means of nutrition.  Has refused feeding tube (panda or PEG) but may need to repeat this discussion if he is not able to consistently tolerate trach collar to allow for PO intake.   Type 2 DM. Plan: - SSI with lantus  Chronic pain. Deconditioning. Plan: - OOB, PT/OT - limit narcotics as tolerated  Hx of gout. Plan: - hold outpt allopurinol  Moraxella tracheobronchitis >> completed abx 2/07.  Acute  encephalopathy 2nd to respiratory failure >> resolved.  Summary: Overall ready for [facility].  However, with decreased trach collar tolerance, need to have more definitive plan for nutrition if unable to consistently take PO's.  Has refused panda/PEG but may need to revisit this if he continues to fail daytime ATC. PT/OT and RT to attempt ATC again today with better positioning.     Dirk Dress, NP 07/22/2014  2:40 PM Pager: [phone] or [phone]  Reviewed above, and agree.  He is having trouble tolerating being off ventilator.  Will need to see how he does with better positioning off vent.  Will then need to determine how to keep up with his nutrition and oral medications.  Coralyn Helling, MD [facility] Pulmonary/Critical Care 07/22/2014, 5:07 PM Pager:  [phone] After 3pm call: [phone]

## 2014-07-22 NOTE — Progress Notes (Signed)
Physical Therapy Treatment Patient Details Name: Blake Summers MRN: [id number] DOB: July 04, 1945 Today's Date: 07/22/2014    History of Present Illness [name] is a 69yo man w/ PMHx of obstructive sleep apnea, obesity hypoventilation syndrome, GERD, Type 2 DM, and chronic hypercarbic respiratory failure on BiPAP at home who presented with worsening SOB for the past week. Found to be in acute on chronic hypercarbic respiratory failure. Pt intubated 1/29 s/p trach 2/2    PT Comments    Extended co-treatment session with speech language pathologists and respiratory therapist for positioning and mobility during SLP FEEs assessment, as well as in line speaking valve with mobility on ventilator support.  Patient tolerated activity incredibly well. Minimal assist required.  Patient assist OOB for in room ambulation and navigation with RW while on inline valve. Vitals remained stable with saturations remaining upper 90s throughout session. Patient and family educated on mobility and positioning from PT, while SLP provided education regarding valve.  Anticipate patient would have tolerated further mobility had he not been tethered by vent and other lines.   Follow Up Recommendations  SNF;Supervision for mobility/OOB     Equipment Recommendations  None recommended by PT    Recommendations for Other Services Speech consult     Precautions / Restrictions Precautions Precautions: Fall Precaution Comments: trach on vent Restrictions Weight Bearing Restrictions: No    Mobility  Bed Mobility Overal bed mobility: Needs Assistance Bed Mobility: Supine to Sit     Supine to sit: Min assist     General bed mobility comments: min assist to pull to sitting, difficulty scooting to EOB secondary to mechanical features of the bed.   Transfers Overall transfer level: Needs assistance   Transfers: Sit to/from Stand Sit to Stand: Min guard         General transfer comment: patient able to  push up to standing with one arm on bed and one arm on RW  Ambulation/Gait Ambulation/Gait assistance: Min guard Ambulation Distance (Feet): 10 Feet Assistive device: Rolling walker (2 wheeled) Gait Pattern/deviations: Step-to pattern;Decreased stride length;Wide base of support     General Gait Details: Patient with improved posture and ability to negotiate with RW this session. Anticipate patient would have ability to mobilize further without restrictions of lines and leads/ventilator tubing. patient was able to navigate several step sforward and then turn around with RW to sit in power chair.     Stairs            Wheelchair Mobility    Modified Rankin (Stroke Patients Only)       Balance     Sitting balance-Leahy Scale: Fair       Standing balance-Leahy Scale: Fair                      Cognition Arousal/Alertness: Awake/alert Behavior During Therapy: WFL for tasks assessed/performed Overall Cognitive Status: Within Functional Limits for tasks assessed                      Exercises      General Comments        Pertinent Vitals/Pain Pain Assessment: No/denies pain    Home Living                      Prior Function            PT Goals (current goals can now be found in the care plan section) Acute Rehab PT Goals Patient  Stated Goal: return home with wife's assist PT Goal Formulation: With patient Time For Goal Achievement: 07/30/14 Potential to Achieve Goals: Fair Progress towards PT goals: Progressing toward goals    Frequency  Min 2X/week    PT Plan Current plan remains appropriate    Co-evaluation PT/OT/SLP Co-Evaluation/Treatment: Yes Reason for Co-Treatment: For patient/therapist safety;Other (comment) (assisted for positioning furing SLP FEEs assessment)         End of Session Equipment Utilized During Treatment: Oxygen Activity Tolerance: Patient tolerated treatment well Patient left: in chair;with  call bell/phone within reach (in power chair), family present, nurse in room.     Time: 1610-9604 PT Time Calculation (min) (ACUTE ONLY): 45 min  Charges:  $Therapeutic Activity: 8-22 mins                    G CodesFabio Asa 11-Aug-2014, 5:23 PM Charlotte Crumb, PT DPT  [phone]

## 2014-07-23 LAB — CBC
HCT: 42.2 % (ref 39.0–52.0)
Hemoglobin: 12.8 g/dL — ABNORMAL LOW (ref 13.0–17.0)
MCH: 27.8 pg (ref 26.0–34.0)
MCHC: 30.3 g/dL (ref 30.0–36.0)
MCV: 91.5 fL (ref 78.0–100.0)
Platelets: 242 10*3/uL (ref 150–400)
RBC: 4.61 MIL/uL (ref 4.22–5.81)
RDW: 15.5 % (ref 11.5–15.5)
WBC: 8.1 10*3/uL (ref 4.0–10.5)

## 2014-07-23 LAB — GLUCOSE, CAPILLARY: GLUCOSE-CAPILLARY: 80 mg/dL (ref 70–99)

## 2014-07-23 LAB — PROTIME-INR
INR: 1.86 — ABNORMAL HIGH (ref 0.00–1.49)
Prothrombin Time: 21.6 seconds — ABNORMAL HIGH (ref 11.6–15.2)

## 2014-07-23 MED ORDER — WARFARIN SODIUM 5 MG PO TABS: 5.0000 mg | ORAL_TABLET | Freq: Every day | ORAL | Status: DC

## 2014-07-23 MED ORDER — WARFARIN SODIUM 5 MG PO TABS
5.0000 mg | ORAL_TABLET | Freq: Once | ORAL | Status: DC
  Filled 2014-07-23: qty 1

## 2014-07-23 MED ORDER — OXYCODONE-ACETAMINOPHEN 5-325 MG PO TABS: 1.0000 | ORAL_TABLET | ORAL | Status: DC | PRN

## 2014-07-23 NOTE — Clinical Social Work Note (Signed)
Patient will discharge to [facility] Anticipated discharge date:07/23/14 Family notified: wife notified Bonita Quin(Linda) [facility] called at 10:20am  CSW signing off.  Merlyn LotJenna Holoman, LCSWA Clinical Social Worker [phone]

## 2014-07-23 NOTE — Progress Notes (Signed)
ANTICOAGULATION CONSULT NOTE - Follow-up Consult  Pharmacy Consult for Coumadin Indication: atrial fibrillation  Allergies  Allergen Reactions  . Neosporin [Neomycin-Polymyxin-Gramicidin] Other (See Comments)    unknown    Patient Measurements: Height: 5\' 7"  (170.2 cm) Weight: (!) 707 lb 10.8 oz (321 kg) IBW/kg (Calculated) : 66.1 Heparin Dosing Weight: 104 kg (calculated based on admit weight)  Vital Signs: Temp: 98.6 F (37 C) (02/11 0746) Temp Source: Oral (02/11 0746) BP: 142/76 mmHg (02/11 0746) Pulse Rate: 87 (02/11 0746)  Labs:  Recent Labs  07/21/14 0416 07/22/14 0414 07/23/14 0417  HGB 12.1* 12.2* 12.8*  HCT 41.5 40.6 42.2  PLT 221 225 242  LABPROT 26.9* 27.2* 21.6*  INR 2.47* 2.50* 1.86*  HEPARINUNFRC 0.35  --   --   CREATININE 0.66 0.68  --     Estimated Creatinine Clearance: 207.2 mL/min (by C-G formula based on Cr of 0.68).  Assessment: 69yo male continues on coumadin however, pt did not received warfarin on 2/8 or 2/9 due to inability to take PO. Pt did receive his dose last night. INR is now low at 1.86 due to multiple missed days of coumadin. CBC is stable and no bleeding noted.   Goal of Therapy:  INR 2-3 Monitor platelets by anticoagulation protocol: Yes   Plan:  1. Repeat warfarin 5mg  PO x 1 today - will give earlier in the day to hopefully prevent further decreases in INR and better assess effect on AM INR 2. F/u AM INR 3. Should the patient be put back on parenteral anticoagulation while INR is subtherapeutic? MD please address  Lysle Pearlachel Roxie Gueye, PharmD, BCPS Pager # [phone] 07/23/2014 10:44 AM

## 2014-07-23 NOTE — Progress Notes (Signed)
Report called [facility]ela[facility].  Updated on patient history, current status, and plan of care.  Patient is stable and able to transport at this time.  Wife is at bedside and updated on plan for transport.

## 2014-07-23 NOTE — Progress Notes (Signed)
Subjective: Back on vent. No trach issues.  Objective: Vital signs in last 24 hours: Temp:  [97.8 F (36.6 C)-99.1 F (37.3 C)] 98.6 F (37 C) (02/11 0746) Pulse Rate:  [71-111] 87 (02/11 0746) Resp:  [17-26] 18 (02/11 0746) BP: (123-155)/(68-103) 142/76 mmHg (02/11 0746) SpO2:  [92 %-100 %] 97 % (02/11 0746) FiO2 (%):  [50 %] 50 % (02/11 0500) Weight:  [707 lb 10.8 oz (321 kg)] 707 lb 10.8 oz (321 kg) (02/11 0500)  PHYSICAL EXAMINATION: General: obese, awake, on vent. Neuro: no focal deficits, eyes open, responsive HEENT: [place]/AT [place]/OC: Normal mucosa. No lesion. Neck: Trach midline, in place. Ext: 1-2+ pitting edema in lower extremities Skin: chronic venous skin changes in lower extremities   Recent Labs  07/22/14 0414 07/23/14 0417  WBC 7.0 8.1  HGB 12.2* 12.8*  HCT 40.6 42.2  PLT 225 242    Recent Labs  07/21/14 0416 07/22/14 0414  NA 141 144  K 4.2 3.8  CL 101 102  CO2 31 35*  GLUCOSE 111* 97  BUN 11 8  CREATININE 0.66 0.68  CALCIUM 8.8 8.8    Medications:  I have reviewed the patient's current medications. Scheduled: . antiseptic oral rinse  7 mL Mouth Rinse QID  . aspirin  81 mg Oral Daily  . brimonidine  1 drop Both Eyes BID  . chlorhexidine  15 mL Mouth Rinse BID  . diltiazem  60 mg Oral 3 times per day  . dorzolamide-timolol  1 drop Both Eyes BID  . insulin aspart  0-15 Units Subcutaneous TID WC  . insulin glargine  40 Units Subcutaneous QHS  . latanoprost  1 drop Both Eyes QHS  . pantoprazole  40 mg Oral Q24H  . pravastatin  40 mg Oral q1800  . Warfarin - Pharmacist Dosing Inpatient   Does not apply q1800   WUJ:WJXBJYPRN:sodium chloride, albuterol, fentaNYL, hydrALAZINE, metoprolol, oxyCODONE-acetaminophen  Assessment/Plan: POD#9 s/p trach. Back on vent. No trach issues. Due to his difficult airway, will leave fresh trach in place for 2 weeks. Will change trach early next week.    LOS: 13 days   Blake Summers,SUI W 07/23/2014, 8:21 AM

## 2014-08-26 ENCOUNTER — Ambulatory Visit: Payer: Medicare Other | Admitting: Endocrinology

## 2014-10-09 ENCOUNTER — Telehealth: Payer: Self-pay

## 2014-10-09 NOTE — Telephone Encounter (Signed)
Unable to reach patient ° °Will try again at a later time ° °

## 2014-10-09 NOTE — Telephone Encounter (Signed)
Error

## 2014-10-09 NOTE — Telephone Encounter (Addendum)
B.J, [facility] with [facility] call stating she is in the home right now with this patient. She stated he was released from [facility] yesterday with a trache tube and she has been ordered to check coumadin once a week. Also, the patient states he has a open wound where the rectal catheter was placed. Nurse needs verbal orders from us for wound care and if we will monitor his coumadin checks? Please advise, Thanks!

## 2014-10-09 NOTE — Telephone Encounter (Signed)
Please move up appt to next week. We'll check coumadin. Ok for wound care

## 2014-10-09 NOTE — Telephone Encounter (Signed)
I contacted B.J and advised of note below. While on the phone the nurse advised me the area of the wound was a yeast infection. The infection is on both cheeks of his bottom and his scrotum. Pt is requesting a medication be called in for this. The infection is very painful and hurts for the patient to sit down. Pt has moved his appointment to 10/12/2014 at 215 pm. Please advise, Thanks!

## 2014-10-09 NOTE — Telephone Encounter (Signed)
Patient's wife advised of note below and voiced understanding.  

## 2014-10-09 NOTE — Telephone Encounter (Signed)
Unable to reach pt. Will try again at a later time.  

## 2014-10-09 NOTE — Telephone Encounter (Signed)
i need to see this in order to rx

## 2014-10-12 ENCOUNTER — Encounter: Payer: Self-pay | Admitting: Endocrinology

## 2014-10-12 ENCOUNTER — Ambulatory Visit (INDEPENDENT_AMBULATORY_CARE_PROVIDER_SITE_OTHER): Payer: Medicare Other | Admitting: Endocrinology

## 2014-10-12 VITALS — BP 122/60 | HR 84 | Temp 98.4°F | Ht 67.0 in | Wt 296.0 lb

## 2014-10-12 DIAGNOSIS — I482 Chronic atrial fibrillation, unspecified: Secondary | ICD-10-CM

## 2014-10-12 DIAGNOSIS — I4891 Unspecified atrial fibrillation: Secondary | ICD-10-CM

## 2014-10-12 LAB — PROTIME-INR
INR: 2.2 ratio — AB (ref 0.8–1.0)
PROTHROMBIN TIME: 23.5 s — AB (ref 9.6–13.1)

## 2014-10-12 MED ORDER — INSULIN ASPART 100 UNIT/ML FLEXPEN: 25.0000 [IU] | PEN_INJECTOR | Freq: Three times a day (TID) | SUBCUTANEOUS | Status: DC

## 2014-10-12 MED ORDER — INSULIN GLARGINE 100 UNIT/ML ~~LOC~~ SOLN: 45.0000 [IU] | Freq: Every day | SUBCUTANEOUS | Status: DC

## 2014-10-12 NOTE — Patient Instructions (Addendum)
Please reduce the lantus to 45 units at bedtime only Please increase the novolog to 25 units 3 times a day (just before each meal). check your blood sugar twice a day.  vary the time of day when you check, between before the 3 meals, and at bedtime.  also check if you have symptoms of your blood sugar being too high or too low.  please keep a record of the readings and bring it to your next appointment here.  You can write it on any piece of paper.  please call us sooner if your blood sugar goes below 70, or if you have a lot of readings over 200.   Please see Dr Shelle Ironlance as scheduled.   Apply clotrimazole to the read area, to stop the yeast infection.  blood tests are requested for you today.  We'll let you know about the results.   Please see a heart specialist.  you will receive a phone call, about a day and time for an appointment.

## 2014-10-12 NOTE — Progress Notes (Signed)
Subjective:    Patient ID: Blake Summers, male    DOB: Jul 26, 1945, 69 y.o.   MRN: [id number]  HPI Pt returns for f/u of diabetes mellitus: DM type: Insulin-requiring type 2 Dx'ed: 1990 Complications: polyneuropathy, CAD, CVA, and foot ulcer Therapy: insulin since 1999 DKA: never Severe hypoglycemia: never Pancreatitis: never Other: he takes multiple daily injections; he was turned down for weight-loss surgery, due to comorbidities Interval history: no cbg record, but states cbg's vary from 160-300.  It is in general higher as the day goes on.   Her is still on nocturnal ventilator.  He says sob is much better.   He has few days of moderate rash at the perineal area, and assoc itching Past Medical History  Diagnosis Date  . GERD 07/06/2010  . Edema 10/28/2007  . DIABETES MELLITUS, TYPE II 05/04/2007  . HYPERLIPIDEMIA 07/08/2008  . ALLERGIC RHINITIS 07/08/2008  . GLAUCOMA 07/08/2008  . CEREBROVASCULAR ACCIDENT, HX OF 07/08/2008  . OBSTRUCTIVE SLEEP APNEA 03/06/2008  . DIABETIC ULCER, LEFT LEG 08/23/2009  . 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 HYPERTENSION 10/28/2007  . VENOUS INSUFFICIENCY 03/08/2009  . FATTY LIVER DISEASE 05/19/2008  . BENIGN PROSTATIC HYPERTROPHY 07/08/2008  . 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 NEPHROLITHIASIS, HX OF 07/08/2008  . Morbid obesity   . DM nephropathy/sclerosis   . 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 Lumbar spondylosis   . Diarrhea 06/08/2013    Past Surgical History  Procedure Laterality Date  . Lithotripsy  2007  . Tracheostomy tube placement N/A 07/14/2014    Procedure: TRACHEOSTOMY;  Surgeon: Darletta Moll, MD;  Location: [facility] OR;  Service: ENT;  Laterality: N/A;    History   Social History  . Marital Status: Married    Spouse Name: N/A  . Number of Children: N/A  . Years of Education: N/A   Occupational History  . Retired     Nature conservation officer   Social History Main Topics  . Smoking status: Former Smoker --  0.50 packs/day for 15 years    Types: Cigarettes    Quit date: 06/12/1988  . Smokeless tobacco: Never Used  . Alcohol Use: No  . Drug Use: No  . Sexual Activity: Not on file   Other Topics Concern  . Not on file   Social History Narrative   Diet is "good"   Exercise is limited by medical problems    Current Outpatient Prescriptions on File Prior to Visit  Medication Sig Dispense Refill  . diltiazem (CARDIZEM) 60 MG tablet Take 1 tablet (60 mg total) by mouth every 8 (eight) hours.    Marland Kitchen warfarin (COUMADIN) 5 MG tablet Take 1 tablet (5 mg total) by mouth daily.    Marland Kitchen albuterol (PROVENTIL) (2.5 MG/3ML) 0.083% nebulizer solution Take 3 mLs (2.5 mg total) by nebulization every 3 (three) hours as needed for wheezing or shortness of breath. 75 mL 12  . aspirin 81 MG chewable tablet Chew 1 tablet (81 mg total) by mouth daily.    . brimonidine (ALPHAGAN) 0.15 % ophthalmic solution Place 1 drop into both eyes 2 (two) times daily.     . diclofenac sodium (VOLTAREN) 1 % GEL APPLY TOPICALLY 4 TIMES DAILY AS PHYSICIAN INSTRUCTED 200 g 3  . dorzolamide-timolol (COSOPT) 22.3-6.8 MG/ML ophthalmic solution Place 1 drop into both eyes 2 (two) times daily.     Marland Kitchen ketoconazole (NIZORAL) 2 % shampoo Apply 1 application topically once a week.     Marland Kitchen  latanoprost (XALATAN) 0.005 % ophthalmic solution Place 1 drop into both eyes at bedtime. 2.5 mL 4  . lidocaine (LIDODERM) 5 % Place 1-3 patches onto the skin daily. Remove & Discard patch within 12 hours or as directed by MD 90 patch 2  . oxyCODONE-acetaminophen (PERCOCET/ROXICET) 5-325 MG per tablet Take 1 tablet by mouth every 4 (four) hours as needed for moderate pain. 15 tablet 0  . pantoprazole (PROTONIX) 40 MG tablet Take 1 tablet (40 mg total) by mouth daily.    . pravastatin (PRAVACHOL) 40 MG tablet Take 1 tablet (40 mg total) by mouth daily at 6 PM. 30 tablet    No current facility-administered medications on file prior to visit.    Allergies    Allergen Reactions  . Neosporin [Neomycin-Polymyxin-Gramicidin] Other (See Comments)    unknown    Family History  Problem Relation Age of Onset  . Cancer Brother     Colon Cancer  . Heart disease Brother   . Heart disease Brother   . Asthma Mother     BP 122/60 mmHg  Pulse 84  Temp(Src) 98.4 F (36.9 C) (Oral)  Ht 5\' 7"  (1.702 m)  Wt 296 lb (134.265 kg)  BMI 46.35 kg/m2  SpO2 91%     Review of Systems He denies hypoglycemia.  He has lost 50 lbs.      Objective:   Physical Exam VITAL SIGNS:  See vs page GENERAL: no distress.  Morbid obesity.  In wheelchair.  Has 02 on.  Has tracheostomy.  Pulses: dorsalis pedis intact bilat.   MSK: no deformity of the feet CV: 1+ bilat leg edema.   Skin:  no ulcer on the feet.  normal color and temp on the feet.  spotty hyperpigmentation of the legs and feet. Neuro: sensation is intact to touch on the feet, but decreased from normal.   Ext: There is bilateral onychomycosis of the toenails.   Perineal area: moderate erythema.  Lab Results  Component Value Date   INR 2.2* 10/12/2014   INR 1.86* 07/23/2014   INR 2.50* 07/22/2014   Assessment: Cutaneous skin infection pf the perineum, new DM: he needs increased rx AF: therapeutic anticoagulation.  Please continue the same coumadin resp failure: improved.         Patient is advised the following: Patient Instructions  Please reduce the lantus to 45 units at bedtime only Please increase the novolog to 25 units 3 times a day (just before each meal). check your blood sugar twice a day.  vary the time of day when you check, between before the 3 meals, and at bedtime.  also check if you have symptoms of your blood sugar being too high or too low.  please keep a record of the readings and bring it to your next appointment here.  You can write it on any piece of paper.  please call us sooner if your blood sugar goes below 70, or if you have a lot of readings over 200.   Please see Dr  Shelle Ironlance as scheduled.   Apply clotrimazole to the read area, to stop the yeast infection.  blood tests are requested for you today.  We'll let you know about the results.   Please see a heart specialist.  you will receive a phone call, about a day and time for an appointment.

## 2014-10-15 ENCOUNTER — Telehealth: Payer: Self-pay | Admitting: Endocrinology

## 2014-10-15 NOTE — Telephone Encounter (Signed)
Blake FanningJulie called from [facility] and she would like to know if [name] coumadin has been changed   Please advise   Thank you

## 2014-10-16 NOTE — Telephone Encounter (Signed)
I contacted the patient. Right now the patient is taking 6 mg of coumadin 2 times per day.

## 2014-10-16 NOTE — Telephone Encounter (Signed)
Pt advised to continue the same coumadin. Coumadin Clinic nurse will be back in the office on Tuesday. I sent a staff e-mail advising Arline AspCindy the patient needed to have coumadin follow up next week.

## 2014-10-16 NOTE — Telephone Encounter (Signed)
Please continue the same coumadin please call elam. Pt needs appt in coumadin clinic next week

## 2014-10-19 ENCOUNTER — Ambulatory Visit (INDEPENDENT_AMBULATORY_CARE_PROVIDER_SITE_OTHER): Payer: Medicare Other | Admitting: General Practice

## 2014-10-19 ENCOUNTER — Telehealth: Payer: Self-pay | Admitting: Endocrinology

## 2014-10-19 DIAGNOSIS — Z5181 Encounter for therapeutic drug level monitoring: Secondary | ICD-10-CM | POA: Insufficient documentation

## 2014-10-19 DIAGNOSIS — I4891 Unspecified atrial fibrillation: Secondary | ICD-10-CM

## 2014-10-19 NOTE — Progress Notes (Signed)
Pre visit review using our clinic review tool, if applicable. No additional management support is needed unless otherwise documented below in the visit note. 

## 2014-10-19 NOTE — Telephone Encounter (Signed)
Raynelle FanningJulie from [facility] # [phone]  Raynelle FanningJulie asking about [facility] they checked in last thursday

## 2014-10-19 NOTE — Telephone Encounter (Signed)
Left voicemail advising Blake Summers with [facility] any results can be faxed to [phone]. Also, I informed her from this point on our coumadin clinic nurse, Adasyn Mcadams MechCindy Boyd would be following up on the pt's coumadin checks. Requested call back if she would like to discuss.

## 2014-10-20 ENCOUNTER — Ambulatory Visit (INDEPENDENT_AMBULATORY_CARE_PROVIDER_SITE_OTHER): Payer: Medicare Other | Admitting: General Practice

## 2014-10-20 ENCOUNTER — Encounter: Payer: Self-pay | Admitting: Registered Nurse

## 2014-10-20 ENCOUNTER — Encounter: Payer: Medicare Other | Attending: Registered Nurse | Admitting: Registered Nurse

## 2014-10-20 VITALS — BP 139/61 | HR 61 | Resp 14

## 2014-10-20 DIAGNOSIS — K5909 Other constipation: Secondary | ICD-10-CM | POA: Insufficient documentation

## 2014-10-20 DIAGNOSIS — Z79899 Other long term (current) drug therapy: Secondary | ICD-10-CM

## 2014-10-20 DIAGNOSIS — M545 Low back pain, unspecified: Secondary | ICD-10-CM

## 2014-10-20 DIAGNOSIS — J9620 Acute and chronic respiratory failure, unspecified whether with hypoxia or hypercapnia: Secondary | ICD-10-CM | POA: Diagnosis not present

## 2014-10-20 DIAGNOSIS — Z930 Tracheostomy status: Secondary | ICD-10-CM | POA: Diagnosis not present

## 2014-10-20 DIAGNOSIS — Z5181 Encounter for therapeutic drug level monitoring: Secondary | ICD-10-CM

## 2014-10-20 DIAGNOSIS — G8929 Other chronic pain: Secondary | ICD-10-CM | POA: Diagnosis not present

## 2014-10-20 DIAGNOSIS — M25561 Pain in right knee: Secondary | ICD-10-CM | POA: Insufficient documentation

## 2014-10-20 DIAGNOSIS — T40605A Adverse effect of unspecified narcotics, initial encounter: Secondary | ICD-10-CM | POA: Diagnosis not present

## 2014-10-20 DIAGNOSIS — I4891 Unspecified atrial fibrillation: Secondary | ICD-10-CM | POA: Diagnosis not present

## 2014-10-20 DIAGNOSIS — M1711 Unilateral primary osteoarthritis, right knee: Secondary | ICD-10-CM

## 2014-10-20 DIAGNOSIS — Z8679 Personal history of other diseases of the circulatory system: Secondary | ICD-10-CM | POA: Diagnosis not present

## 2014-10-20 DIAGNOSIS — Z760 Encounter for issue of repeat prescription: Secondary | ICD-10-CM | POA: Diagnosis not present

## 2014-10-20 DIAGNOSIS — M179 Osteoarthritis of knee, unspecified: Secondary | ICD-10-CM

## 2014-10-20 LAB — POCT INR: INR: 2.9

## 2014-10-20 MED ORDER — OXYCODONE-ACETAMINOPHEN 7.5-325 MG PO TABS: ORAL_TABLET | ORAL | Status: DC

## 2014-10-20 MED ORDER — MORPHINE SULFATE ER 15 MG PO TBCR: 15.0000 mg | EXTENDED_RELEASE_TABLET | Freq: Two times a day (BID) | ORAL | Status: DC

## 2014-10-20 NOTE — Progress Notes (Signed)
Pre visit review using our clinic review tool, if applicable. No additional management support is needed unless otherwise documented below in the visit note. 

## 2014-10-20 NOTE — Progress Notes (Signed)
Subjective:    Patient ID: Blake Summers, male    DOB: 08-22-1945, 69 y.o.   MRN: [id number]  HPI: Mr. Blake SalenJoseph L Heitman is a 69 year old male who returns for follow up for chronic pain and medication refill. He says his pain is located in his lower back and right knee. He rates his pain 6. His current exercise regime is performing stretching exercises and working with [facility] physical therapist and Occupational Therapist. He arrived to clinic in his motorized wheelchair. Has bilateral velcro wraps to lower extremities.   On July 10, 2014 [facility] Diagnosis Acute on Chronic Respiratory Failure, he had a tracheostomy on 07/14/14 and Discharged on 07/23/2014 to [facility]. [facility]. He was admitted to [facility] 07/22/14 and discharged 10/08/14. He has a Research scientist (medical)Trach and Passy muir valve. Continuous oxygen nasal cannula. He's placed on the ventilator from 11:00pm till 7:00 am.  Discharge Medication's reviewed. He was discharged on MS Contin 15 mg BID and Oxycodone 5/325 mg. His MS Contin and Oxycodone will be resumed as previously prescribed. The above was discussed with Dr. Riley KillSwartz and he agrees with plan.  Wife in room all questions answered.    Pain Inventory Average Pain 8 Pain Right Now 6 My pain is sharp, stabbing and aching  In the last 24 hours, has pain interfered with the following? General activity 8 Relation with others 8 Enjoyment of life 8 What TIME of day is your pain at its worst? daytime Sleep (in general) Fair  Pain is worse with: walking, bending and standing Pain improves with: rest, heat/ice and medication Relief from Meds: 8  Mobility walk with assistance use a cane use a walker how many minutes can you walk? 0 ability to climb steps?  no do you drive?  yes use a wheelchair transfers alone  Function retired I need assistance with the following:  dressing, bathing, meal  prep, household duties and shopping  Neuro/Psych weakness numbness tingling trouble walking dizziness  Prior Studies Any changes since last visit?  yes recent hospitalization  Physicians involved in your care Any changes since last visit?  no   Family History  Problem Relation Age of Onset  . Cancer Brother     Colon Cancer  . Heart disease Brother   . Heart disease Brother   . Asthma Mother    History   Social History  . Marital Status: Married    Spouse Name: N/A  . Number of Children: N/A  . Years of Education: N/A   Occupational History  . Retired     Nature conservation officeroperations manager   Social History Main Topics  . Smoking status: Former Smoker -- 0.50 packs/day for 15 years    Types: Cigarettes    Quit date: 06/12/1988  . Smokeless tobacco: Never Used  . Alcohol Use: No  . Drug Use: No  . Sexual Activity: Not on file   Other Topics Concern  . None   Social History Narrative   Diet is "good"   Exercise is limited by medical problems   Past Surgical History  Procedure Laterality Date  . Lithotripsy  2007  . Tracheostomy tube placement N/A 07/14/2014    Procedure: TRACHEOSTOMY;  Surgeon: Darletta MollSui W Teoh, MD;  [facility];  Service: ENT;  Laterality: N/A;   Past Medical History  Diagnosis Date  . GERD 07/06/2010  . Edema 10/28/2007  . DIABETES  MELLITUS, TYPE II 05/04/2007  . HYPERLIPIDEMIA 07/08/2008  . ALLERGIC RHINITIS 07/08/2008  . GLAUCOMA 07/08/2008  . CEREBROVASCULAR ACCIDENT, HX OF 07/08/2008  . OBSTRUCTIVE SLEEP APNEA 03/06/2008  . DIABETIC ULCER, LEFT LEG 08/23/2009  . 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 HYPERTENSION 10/28/2007  . VENOUS INSUFFICIENCY 03/08/2009  . FATTY LIVER DISEASE 05/19/2008  . BENIGN PROSTATIC HYPERTROPHY 07/08/2008  . 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 NEPHROLITHIASIS, HX OF 07/08/2008  . Morbid obesity   . DM nephropathy/sclerosis   . 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 Lumbar spondylosis   . Diarrhea  06/08/2013   BP 139/61 mmHg  Pulse 61  Resp 14  SpO2 93%  Opioid Risk Score:   Fall Risk Score:  `1  Depression screen PHQ 2/9  Depression screen PHQ 2/9 10/20/2014  Decreased Interest 0  Down, Depressed, Hopeless 0  PHQ - 2 Score 0  Altered sleeping 0  Tired, decreased energy 2  Change in appetite 0  Feeling bad or failure about yourself  0  Trouble concentrating 0  Moving slowly or fidgety/restless 0  Suicidal thoughts 0  PHQ-9 Score 2     Review of Systems  Constitutional:       Weight loss  Respiratory: Positive for cough.        Respiratory infection  Gastrointestinal: Positive for constipation.  Musculoskeletal: Positive for gait problem.  Neurological: Positive for dizziness, weakness and numbness.       Tingling   All other systems reviewed and are negative.      Objective:   Physical Exam  Constitutional: He is oriented to person, place, and time. He appears well-developed and well-nourished.  HENT:  Head: Normocephalic and atraumatic.  Neck: Normal range of motion. Neck supple.  Cardiovascular: Normal rate and regular rhythm.   Pulmonary/Chest: Effort normal and breath sounds normal.  Continous oxygen  Musculoskeletal:  Normal Muscle Bulk and Muscle testing Reveals: Upper Extremities: Full ROM and Muscle strength 5/5 Lumbar Paraspinal Tenderness: L-3- L-5 Lower Extremities: Full ROM and Muscle Strength 5/5 Lower Extremities Velcro Wrap Intact Arrived in Motorized Wheelchair  Neurological: He is alert and oriented to person, place, and time.  Skin: Skin is warm and dry.  Psychiatric: He has a normal mood and affect.  Nursing note and vitals reviewed.         Assessment & Plan:  1. End-stage right knee osteoarthritis: Continue to use Voltaren gel  Refilled: MS contin 15 mg #60. One tablet every 12 hours and  Percocet 7.5/325 mg # 90 pills--use every four hours as needed for pain. No more than 3 a day. 2. Morbid obesity with generalized  deconditioning:  Continue: [facility] with PT/OT 3. Chronic low back pain/spondylosis:  Continue MS contin with prn percocet.  5. Narcotic induced constipation: Continue with Bowel Program: Senna, Colace, Miralax as prescribed. 6. Acute on Chronic Respiratory Failure: S/P Tracheostomy/ PMV/ Vent at HS.  20 minutes of face to face patient care time was spent during this visit. All questions were encouraged and answered.   F/U in 1 month

## 2014-10-20 NOTE — Progress Notes (Signed)
Agree with plan 

## 2014-10-21 ENCOUNTER — Telehealth: Payer: Self-pay | Admitting: Endocrinology

## 2014-10-21 MED ORDER — GLUCOSE BLOOD VI STRP: ORAL_STRIP | Status: DC

## 2014-10-21 NOTE — Telephone Encounter (Signed)
Patient called stating that his insurance will not cover his test strips However they will cover the one touch verio; already has meter   Rx: Test strips One touch Verio   Pharmacy: [facility]   Thank you

## 2014-10-21 NOTE — Telephone Encounter (Signed)
Rx for one touch ultra sent per pt's request.

## 2014-10-22 ENCOUNTER — Encounter: Payer: Self-pay | Admitting: Registered Nurse

## 2014-10-23 ENCOUNTER — Ambulatory Visit: Payer: Medicare Other | Admitting: Endocrinology

## 2014-10-27 ENCOUNTER — Ambulatory Visit (INDEPENDENT_AMBULATORY_CARE_PROVIDER_SITE_OTHER): Payer: Medicare Other | Admitting: General Practice

## 2014-10-27 DIAGNOSIS — Z8679 Personal history of other diseases of the circulatory system: Secondary | ICD-10-CM

## 2014-10-27 DIAGNOSIS — Z5181 Encounter for therapeutic drug level monitoring: Secondary | ICD-10-CM

## 2014-10-27 DIAGNOSIS — I4891 Unspecified atrial fibrillation: Secondary | ICD-10-CM | POA: Diagnosis not present

## 2014-10-27 LAB — POCT INR: INR: 3.2

## 2014-10-27 NOTE — Progress Notes (Signed)
Agree with plan 

## 2014-10-27 NOTE — Progress Notes (Signed)
Pre visit review using our clinic review tool, if applicable. No additional management support is needed unless otherwise documented below in the visit note. 

## 2014-11-03 ENCOUNTER — Encounter: Payer: Self-pay | Admitting: Pulmonary Disease

## 2014-11-03 ENCOUNTER — Ambulatory Visit (INDEPENDENT_AMBULATORY_CARE_PROVIDER_SITE_OTHER): Payer: Medicare Other | Admitting: General Practice

## 2014-11-03 ENCOUNTER — Ambulatory Visit (INDEPENDENT_AMBULATORY_CARE_PROVIDER_SITE_OTHER): Payer: Medicare Other | Admitting: Pulmonary Disease

## 2014-11-03 VITALS — BP 124/72 | HR 77 | Temp 98.7°F | Ht 67.0 in | Wt 308.2 lb

## 2014-11-03 DIAGNOSIS — Z930 Tracheostomy status: Secondary | ICD-10-CM | POA: Diagnosis not present

## 2014-11-03 DIAGNOSIS — Z5181 Encounter for therapeutic drug level monitoring: Secondary | ICD-10-CM

## 2014-11-03 DIAGNOSIS — J9611 Chronic respiratory failure with hypoxia: Secondary | ICD-10-CM

## 2014-11-03 DIAGNOSIS — E662 Morbid (severe) obesity with alveolar hypoventilation: Secondary | ICD-10-CM | POA: Diagnosis not present

## 2014-11-03 DIAGNOSIS — I4891 Unspecified atrial fibrillation: Secondary | ICD-10-CM | POA: Diagnosis not present

## 2014-11-03 DIAGNOSIS — Z8679 Personal history of other diseases of the circulatory system: Secondary | ICD-10-CM

## 2014-11-03 LAB — POCT INR: INR: 2

## 2014-11-03 NOTE — Assessment & Plan Note (Signed)
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 He now has been discharged from [facility], and continues on a vent at night with unknown settings. He feels that he is doing much better with this, and uses a Passy-Muir valve during the day. He denies to me any significant congestion, cough, or mucus. There is a question raised it [facility] of whether or not he had bilateral cord paralysis? I will get him a follow-up with otolaryngology for trach management, and also to look at his vocal cords. I will also get his ventilator settings from [facility] for documentation. I have encouraged the patient to continue on his ventilator at night, and to work aggressively on weight loss. Because I am leaving the practice, and the wife has expressed an interest in seeing Dr. Sherene SiresWert again for his pulmonary care. I will arrange follow-up.

## 2014-11-03 NOTE — Patient Instructions (Signed)
Will get you an apptm with your ENT doctor to establish for trach care, and to evaluate your ?vocal cord issue.  Will get your vent settings from [facility]. It is ok to discontinue your nebulized medications. Work on weight loss followup with Dr Sherene SiresWert in 2mos.

## 2014-11-03 NOTE — Progress Notes (Signed)
   Subjective:    Patient ID: Blake SalenJoseph L Summers, male    DOB: 11/20/1945, 69 y.o.   MRN: [id number]  HPI The patient comes in today for follow-up of his obesity hypoventilation syndrome with chronic respiratory failure. He was being maintained on bilevel, however presented to the hospital with acute decompensation in January of this year. Ultimately required a tracheostomy with chronic nocturnal vent support, and was transferred to [facility]. He was there until the middle of April, and discharged home. The patient has been doing fairly well at home on a nocturnal event, and uses a Passy-Muir valve with his tracheostomy during the day. Scant records from [facility] also talk about some issue with vocal cord paralysis? The patient feels that he is doing very well, and has lost 30 pounds since his last visit here.   Review of Systems  Constitutional: Negative for fever and unexpected weight change.  HENT: Positive for congestion and postnasal drip. Negative for dental problem, ear pain, nosebleeds, rhinorrhea, sinus pressure, sneezing, sore throat and trouble swallowing.   Eyes: Negative for redness and itching.  Respiratory: Positive for cough, shortness of breath and wheezing. Negative for chest tightness.   Cardiovascular: Negative for palpitations and leg swelling.  Gastrointestinal: Negative for nausea and vomiting.  Genitourinary: Negative for dysuria.  Musculoskeletal: Negative for joint swelling.  Skin: Negative for rash.  Neurological: Negative for headaches.  Hematological: Does not bruise/bleed easily.  Psychiatric/Behavioral: Negative for dysphoric mood. The patient is not nervous/anxious.        Objective:   Physical Exam Morbidly obese male in no acute distress Nose without purulence or discharge noted Tracheostomy in place with a Passy-Muir valve, clean site Chest with decreased depth of inspiration, mild basilar crackles, no wheezing Cardiac exam with regular rate and rhythm Lower  extremities with 1+ edema, no cyanosis Alert and oriented, moves all 4 extremities.       Assessment & Plan:

## 2014-11-03 NOTE — Progress Notes (Signed)
Pre visit review using our clinic review tool, if applicable. No additional management support is needed unless otherwise documented below in the visit note. 

## 2014-11-03 NOTE — Progress Notes (Signed)
I have reviewed and agree with the plan. 

## 2014-11-04 ENCOUNTER — Ambulatory Visit (INDEPENDENT_AMBULATORY_CARE_PROVIDER_SITE_OTHER): Payer: Medicare Other | Admitting: Endocrinology

## 2014-11-04 ENCOUNTER — Encounter: Payer: Self-pay | Admitting: Endocrinology

## 2014-11-04 VITALS — BP 136/84 | HR 79 | Temp 98.6°F | Wt 306.0 lb

## 2014-11-04 DIAGNOSIS — E1041 Type 1 diabetes mellitus with diabetic mononeuropathy: Secondary | ICD-10-CM | POA: Diagnosis not present

## 2014-11-04 DIAGNOSIS — E1065 Type 1 diabetes mellitus with hyperglycemia: Principal | ICD-10-CM

## 2014-11-04 DIAGNOSIS — IMO0002 Reserved for concepts with insufficient information to code with codable children: Secondary | ICD-10-CM

## 2014-11-04 DIAGNOSIS — Z0000 Encounter for general adult medical examination without abnormal findings: Secondary | ICD-10-CM | POA: Diagnosis not present

## 2014-11-04 DIAGNOSIS — M109 Gout, unspecified: Secondary | ICD-10-CM

## 2014-11-04 DIAGNOSIS — E1049 Type 1 diabetes mellitus with other diabetic neurological complication: Secondary | ICD-10-CM

## 2014-11-04 LAB — BASIC METABOLIC PANEL
BUN: 15 mg/dL (ref 6–23)
CO2: 34 mEq/L — ABNORMAL HIGH (ref 19–32)
Calcium: 8.9 mg/dL (ref 8.4–10.5)
Chloride: 97 mEq/L (ref 96–112)
Creatinine, Ser: 0.87 mg/dL (ref 0.40–1.50)
GFR: 92.27 mL/min (ref >60.00)
Glucose, Bld: 251 mg/dL — ABNORMAL HIGH (ref 70–99)
Potassium: 3.8 mEq/L (ref 3.5–5.1)
Sodium: 137 mEq/L (ref 135–145)

## 2014-11-04 LAB — LIPID PANEL
Cholesterol: 119 mg/dL (ref 0–200)
HDL: 27.3 mg/dL — ABNORMAL LOW (ref >39.00)
NonHDL: 91.7
TRIGLYCERIDES: 255 mg/dL — AB (ref 0.0–149.0)
Total CHOL/HDL Ratio: 4
VLDL: 51 mg/dL — ABNORMAL HIGH (ref 0.0–40.0)

## 2014-11-04 LAB — LDL CHOLESTEROL, DIRECT: Direct LDL: 55 mg/dL

## 2014-11-04 LAB — URIC ACID: Uric Acid, Serum: 6.2 mg/dL (ref 4.0–7.8)

## 2014-11-04 LAB — HEMOGLOBIN A1C: Hgb A1c MFr Bld: 7.7 % — ABNORMAL HIGH (ref 4.6–6.5)

## 2014-11-04 MED ORDER — SENNA 8.6 MG PO TABS: 1.0000 | ORAL_TABLET | Freq: Every day | ORAL | Status: DC | PRN

## 2014-11-04 MED ORDER — TORSEMIDE 20 MG PO TABS: 20.0000 mg | ORAL_TABLET | Freq: Every day | ORAL | Status: DC

## 2014-11-04 MED ORDER — INSULIN ASPART 100 UNIT/ML FLEXPEN: 30.0000 [IU] | PEN_INJECTOR | Freq: Three times a day (TID) | SUBCUTANEOUS | Status: DC

## 2014-11-04 MED ORDER — DEXLANSOPRAZOLE 60 MG PO CPDR: 1.0000 | DELAYED_RELEASE_CAPSULE | Freq: Every day | ORAL | Status: DC

## 2014-11-04 NOTE — Patient Instructions (Addendum)
check your blood sugar twice a day.  vary the time of day when you check, between before the 3 meals, and at bedtime.  also check if you have symptoms of your blood sugar being too high or too low.  please keep a record of the readings and bring it to your next appointment here.  You can write it on any piece of paper.  please call us sooner if your blood sugar goes below 70, or if you have a lot of readings over 200.     blood tests are requested for you today.  We'll let you know about the results.    i have sent a prescription to your pharmacy, to resume dexilant.   You don't need any blood pressure medication for now.  good diet and exercise significantly improve the control of your diabetes.  please let me know if you wish to be referred to a dietician.  high blood sugar is very risky to your health.  you should see an eye doctor and dentist every year.  It is very important to get all recommended vaccinations.  please consider these measures for your health:  minimize alcohol.  do not use tobacco products.  have a colonoscopy at least every 10 years from age 70.  Women should have an annual mammogram from age 540.  keep firearms safely stored.  always use seat belts.  have working smoke alarms in your home.  see an eye doctor and dentist regularly.  never drive under the influence of alcohol or drugs (including prescription drugs).  those with fair skin should take precautions against the sun. it is critically important to prevent falling down (keep floor areas well-lit, dry, and free of loose objects.  If you have a cane, walker, or wheelchair, you should use it, even for short trips around the house.  Also, try not to rush).   Please come back for a follow-up appointment in 3 months.

## 2014-11-04 NOTE — Progress Notes (Signed)
we discussed code status.  pt requests full code, but would not want to be started or maintained on artificial life-support measures if there was not a reasonable chance of recovery 

## 2014-11-04 NOTE — Progress Notes (Signed)
Subjective:    Patient ID: Blake Summers, male    DOB: 1945-11-25, 69 y.o.   MRN: [id number]  HPI Pt is here for regular wellness examination, and is feeling pretty well in general, and says chronic med probs are stable, except as noted below Past Medical History  Diagnosis Date  . GERD 07/06/2010  . Edema 10/28/2007  . DIABETES MELLITUS, TYPE II 05/04/2007  . HYPERLIPIDEMIA 07/08/2008  . ALLERGIC RHINITIS 07/08/2008  . GLAUCOMA 07/08/2008  . CEREBROVASCULAR ACCIDENT, HX OF 07/08/2008  . OBSTRUCTIVE SLEEP APNEA 03/06/2008  . DIABETIC ULCER, LEFT LEG 08/23/2009  . 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 HYPERTENSION 10/28/2007  . VENOUS INSUFFICIENCY 03/08/2009  . FATTY LIVER DISEASE 05/19/2008  . BENIGN PROSTATIC HYPERTROPHY 07/08/2008  . 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 NEPHROLITHIASIS, HX OF 07/08/2008  . Morbid obesity   . DM nephropathy/sclerosis   . 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 Lumbar spondylosis   . Diarrhea 06/08/2013    Past Surgical History  Procedure Laterality Date  . Lithotripsy  2007  . Tracheostomy tube placement N/A 07/14/2014    Procedure: TRACHEOSTOMY;  Surgeon: Darletta Moll, MD;  Location: [facility] OR;  Service: ENT;  Laterality: N/A;    History   Social History  . Marital Status: Married    Spouse Name: N/A  . Number of Children: N/A  . Years of Education: N/A   Occupational History  . Retired     Nature conservation officer   Social History Main Topics  . Smoking status: Former Smoker -- 0.50 packs/day for 15 years    Types: Cigarettes    Quit date: 06/12/1988  . Smokeless tobacco: Never Used  . Alcohol Use: No  . Drug Use: No  . Sexual Activity: Not on file   Other Topics Concern  . Not on file   Social History Narrative   Diet is "good"   Exercise is limited by medical problems    Current Outpatient Prescriptions on File Prior to Visit  Medication Sig Dispense Refill  . albuterol (PROVENTIL) (2.5 MG/3ML) 0.083% nebulizer  solution Take 3 mLs (2.5 mg total) by nebulization every 3 (three) hours as needed for wheezing or shortness of breath. 75 mL 12  . aspirin 81 MG chewable tablet Chew 1 tablet (81 mg total) by mouth daily.    . brimonidine (ALPHAGAN) 0.15 % ophthalmic solution Place 1 drop into both eyes 2 (two) times daily.     . budesonide (PULMICORT) 0.5 MG/2ML nebulizer solution Take 0.5 mg by nebulization 2 (two) times daily.    . diclofenac (VOLTAREN) 0.1 % ophthalmic solution 4 (four) times daily.    Marland Kitchen diltiazem (CARDIZEM) 60 MG tablet Take 1 tablet (60 mg total) by mouth every 8 (eight) hours.    . dorzolamide-timolol (COSOPT) 22.3-6.8 MG/ML ophthalmic solution Place 1 drop into both eyes 2 (two) times daily.     Marland Kitchen glucose blood (ONETOUCH VERIO) test strip Use to check blood sugar 2 times per day 100 each 2  . insulin glargine (LANTUS) 100 UNIT/ML injection Inject 0.45 mLs (45 Units total) into the skin at bedtime. 10 mL 11  . ketoconazole (NIZORAL) 2 % shampoo Apply 1 application topically once a week.     . latanoprost (XALATAN) 0.005 % ophthalmic solution Place 1 drop into both eyes at bedtime. 2.5 mL 4  . lidocaine (LIDODERM) 5 % Place 1-3 patches onto the skin daily. Remove & Discard patch within 12 hours  or as directed by MD 90 patch 2  . loratadine (CLARITIN) 10 MG tablet Take 10 mg by mouth daily.    . Melatonin 3 MG CAPS Take by mouth.    . morphine (MS CONTIN) 15 MG 12 hr tablet Take 1 tablet (15 mg total) by mouth every 12 (twelve) hours. 60 tablet 0  . oxyCODONE-acetaminophen (PERCOCET) 7.5-325 MG per tablet One tablet every 4 hours as needed. No More than 3 aday 90 tablet 0  . polyethylene glycol (MIRALAX / GLYCOLAX) packet Take 17 g by mouth daily.    . pravastatin (PRAVACHOL) 40 MG tablet Take 1 tablet (40 mg total) by mouth daily at 6 PM. 30 tablet   . tamsulosin (FLOMAX) 0.4 MG CAPS capsule Take 0.4 mg by mouth.    . warfarin (COUMADIN) 5 MG tablet Take 1 tablet (5 mg total) by mouth  daily.     No current facility-administered medications on file prior to visit.    Allergies  Allergen Reactions  . Neosporin [Neomycin-Polymyxin-Gramicidin] Other (See Comments)    unknown    Family History  Problem Relation Age of Onset  . Cancer Brother     Colon Cancer  . Heart disease Brother   . Heart disease Brother   . Asthma Mother     BP 136/84 mmHg  Pulse 79  Temp(Src) 98.6 F (37 C) (Oral)  Wt 306 lb (138.801 kg)  SpO2 95%   Review of Systems  HENT: Negative for ear pain.   Eyes: Negative for redness.  Respiratory: Negative for shortness of breath.   Cardiovascular: Negative for chest pain.  Gastrointestinal: Negative for anal bleeding.  Endocrine: Negative for polyuria.  Musculoskeletal: Positive for back pain.  Skin: Negative for wound.  Neurological: Negative for syncope.  Hematological: Does not bruise/bleed easily.  Psychiatric/Behavioral: Negative for sleep disturbance.   gerd has recurred off rx.  Allergy sxs are well-controlled.  Denies decreased urinary stream.       Objective:   Physical Exam VS: see vs page GEN: no distress.  Morbid obesity.  In wheelchair.  Has 02 on.   HEAD: head: no deformity eyes: no periorbital swelling, no proptosis external nose and ears are normal mouth: no lesion seen NECK: tracheostomy is noted.   CHEST WALL: no deformity LUNGS: clear to auscultation BREASTS:  No gynecomastia CV: reg rate and rhythm, no murmur ABD: abdomen is soft, nontender.  no hepatosplenomegaly.  not distended.  no hernia MUSCULOSKELETAL: muscle bulk and strength are grossly normal.  no obvious joint swelling.     PULSES: no carotid bruit.   NEURO:  cn 2-12 grossly intact.   readily moves all 4's.  sensation is intact to touch on the feet,  SKIN:  Normal texture and temperature.  AK's on the scalp.  NODES:  None palpable at the neck.   PSYCH: alert, well-oriented.  Does not appear anxious nor depressed.         Assessment & Plan:    Wellness visit today, with problems stable, except as noted.    SEPARATE EVALUATION FOLLOWS--EACH PROBLEM HERE IS NEW, NOT RESPONDING TO TREATMENT, OR POSES SIGNIFICANT RISK TO THE PATIENT'S HEALTH: HISTORY OF THE PRESENT ILLNESS: Pt returns for f/u of diabetes mellitus: DM type: Insulin-requiring type 2 Dx'ed: 1990 Complications: polyneuropathy, CAD, CVA, and foot ulcer Therapy: insulin since 1999 DKA: never Severe hypoglycemia: never Pancreatitis: never Other: he takes multiple daily injections; he was turned down for weight-loss surgery, due to comorbidities Interval history: he brings  a record of his cbg's which i have reviewed today.  It varies from 173-345.  There is no trend throughout the day.   PAST MEDICAL HISTORY reviewed and up to date today REVIEW OF SYSTEMS:  No recent gout sxs.  He has gained weight. PHYSICAL EXAMINATION: VITAL SIGNS:  See vs page GENERAL: no distress Pulses: dorsalis pedis intact bilat.   MSK: no deformity of the feet CV: trace bilat leg edema Skin:  no ulcer on the feet.  normal temp on the feet. spotty hyperpigmentation on the feet. Neuro: sensation is intact to touch on the feet, but decreased from normal LAB/XRAY RESULTS: Lab Results  Component Value Date   HGBA1C 7.7* 11/04/2014  IMPRESSION:  H/o gout, now off meds H/o hypokalemia, now off meds DM: he needs increased rx PLAN: increase novolog to 30 units 3 times a day (just before each meal) Check uric acid and K+ today.    Subjective:   Patient here for Medicare annual wellness visit and management of other chronic and acute problems.     Risk factors: advanced age    Roster of Physicians Providing Medical Care to Patient:  See "snapshot"   Activities of Daily Living: In your present state of health, do you have any difficulty performing the following activities (lives with wife)?:  Preparing food and eating?: yes Bathing yourself: yes Getting dressed: yes Using the  toilet: no Moving around from place to place: yes In the past year have you fallen or had a near fall?:No    Home Safety: Has smoke detector and wears seat belts. Firearms are safely stored. No excess sun exposure.    Diet and Exercise  Current exercise habits: severely limited by health probs. Dietary issues discussed: pt reports a healthy diet   Depression Screen  Q1: Over the past two weeks, have you felt down, depressed or hopeless?no  Q2: Over the past two weeks, have you felt little interest or pleasure in doing things? no   The following portions of the patient's history were reviewed and updated as appropriate: allergies, current medications, past family history, past medical history, past social history, past surgical history and problem list.   Review of Systems  Denies hearing loss, and visual loss. Objective:   Vision:  Sees opthalmologist Hearing: grossly normal Body mass index:  See vs page Msk: pt is unable to perform "get-up-and-go" from a sitting position without assistance. Cognitive Impairment Assessment: cognition, memory and judgment appear normal.  remembers 3/3 at 5 minutes.  excellent recall.  can easily read and write a sentence.  alert and oriented x 3.     Assessment:   Medicare wellness utd on preventive parameters    Plan:   During the course of the visit the patient was educated and counseled about appropriate screening and preventive services including:        Fall prevention    Diabetes screening  Nutrition counseling   Vaccines / LABS Zostavax / Pneumococcal Vaccine  today  PSA  Patient Instructions (the written plan) was given to the patient.

## 2014-11-10 ENCOUNTER — Other Ambulatory Visit: Payer: Self-pay | Admitting: General Practice

## 2014-11-10 MED ORDER — WARFARIN SODIUM 6 MG PO TABS: ORAL_TABLET | ORAL | Status: DC

## 2014-11-12 ENCOUNTER — Encounter: Payer: Self-pay | Admitting: Internal Medicine

## 2014-11-12 ENCOUNTER — Encounter: Payer: Self-pay | Admitting: *Deleted

## 2014-11-12 ENCOUNTER — Ambulatory Visit (INDEPENDENT_AMBULATORY_CARE_PROVIDER_SITE_OTHER): Payer: Medicare Other | Admitting: Internal Medicine

## 2014-11-12 VITALS — BP 144/68 | HR 78 | Ht 67.0 in | Wt 302.0 lb

## 2014-11-12 DIAGNOSIS — I482 Chronic atrial fibrillation, unspecified: Secondary | ICD-10-CM

## 2014-11-12 DIAGNOSIS — R0602 Shortness of breath: Secondary | ICD-10-CM | POA: Diagnosis not present

## 2014-11-12 NOTE — Progress Notes (Signed)
Cardiology Office Note   Date:  11/12/2014   ID:  Blake Summers, DOB 1945-09-25, MRN [id number]  PCP:  [name], MD  Cardiologist:   Dietrich Pates, MD   No chief complaint on file.     History of Present Illness: Blake Summers is a 69 y.o. male with a history of DM, stroke, HTN, morbid obesity, hypoventilation syndrom.  Has tracheostomy  He als has a history of atrial fibrillation.   He is followed by Blake Summers and k Clance    Hospitalized in Feb with bronchitis, resp failue.  Found to be in afib  Plan for rate control and coumadin    He has chronic SOB which is stable He gets SOB with minimal activity Denies palpitations.  No dizziness  Denies CP       Current Outpatient Prescriptions  Medication Sig Dispense Refill  . aspirin 81 MG chewable tablet Chew 1 tablet (81 mg total) by mouth daily.    . brimonidine (ALPHAGAN) 0.15 % ophthalmic solution Place 1 drop into both eyes 2 (two) times daily.     . budesonide (PULMICORT) 0.5 MG/2ML nebulizer solution Take 0.5 mg by nebulization 2 (two) times daily.    Marland Kitchen dexlansoprazole (DEXILANT) 60 MG capsule Take 1 capsule (60 mg total) by mouth daily. 30 capsule 6  . diclofenac (VOLTAREN) 0.1 % ophthalmic solution 4 (four) times daily.    Marland Kitchen diltiazem (CARDIZEM) 60 MG tablet Take 1 tablet (60 mg total) by mouth every 8 (eight) hours.    . dorzolamide-timolol (COSOPT) 22.3-6.8 MG/ML ophthalmic solution Place 1 drop into both eyes 2 (two) times daily.     Marland Kitchen glucose blood (ONETOUCH VERIO) test strip Use to check blood sugar 2 times per day 100 each 2  . insulin aspart (NOVOLOG FLEXPEN) 100 UNIT/ML FlexPen Inject 30 Units into the skin 3 (three) times daily with meals. 30 mL 11  . insulin glargine (LANTUS) 100 UNIT/ML injection Inject 0.45 mLs (45 Units total) into the skin at bedtime. 10 mL 11  . ketoconazole (NIZORAL) 2 % shampoo Apply 1 application topically once a week.     . latanoprost (XALATAN) 0.005 % ophthalmic solution Place 1  drop into both eyes at bedtime. 2.5 mL 4  . lidocaine (LIDODERM) 5 % Place 1-3 patches onto the skin daily. Remove & Discard patch within 12 hours or as directed by MD 90 patch 2  . loratadine (CLARITIN) 10 MG tablet Take 10 mg by mouth daily.    . Melatonin 3 MG CAPS Take by mouth.    . morphine (MS CONTIN) 15 MG 12 hr tablet Take 1 tablet (15 mg total) by mouth every 12 (twelve) hours. 60 tablet 0  . oxyCODONE-acetaminophen (PERCOCET) 7.5-325 MG per tablet One tablet every 4 hours as needed. No More than 3 aday 90 tablet 0  . polyethylene glycol (MIRALAX / GLYCOLAX) packet Take 17 g by mouth daily.    . pravastatin (PRAVACHOL) 40 MG tablet Take 1 tablet (40 mg total) by mouth daily at 6 PM. 30 tablet   . senna (SENOKOT) 8.6 MG TABS tablet Take 1 tablet (8.6 mg total) by mouth daily as needed for mild constipation. 50 each 5  . tamsulosin (FLOMAX) 0.4 MG CAPS capsule Take 0.4 mg by mouth.    . torsemide (DEMADEX) 20 MG tablet Take 1 tablet (20 mg total) by mouth daily. 30 tablet 11  . warfarin (COUMADIN) 6 MG tablet Take as directed by anticoagulation clinic 68  tablet 3   No current facility-administered medications for this visit.    Allergies:   Neosporin   Past Medical History  Diagnosis Date  . GERD 07/06/2010  . Edema 10/28/2007  . DIABETES MELLITUS, TYPE II 05/04/2007  . HYPERLIPIDEMIA 07/08/2008  . ALLERGIC RHINITIS 07/08/2008  . GLAUCOMA 07/08/2008  . CEREBROVASCULAR ACCIDENT, HX OF 07/08/2008  . OBSTRUCTIVE SLEEP APNEA 03/06/2008  . DIABETIC ULCER, LEFT LEG 08/23/2009  . 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 HYPERTENSION 10/28/2007  . VENOUS INSUFFICIENCY 03/08/2009  . FATTY LIVER DISEASE 05/19/2008  . BENIGN PROSTATIC HYPERTROPHY 07/08/2008  . 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 NEPHROLITHIASIS, HX OF 07/08/2008  . Morbid obesity   . DM nephropathy/sclerosis   . 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 Lumbar spondylosis   . Diarrhea 06/08/2013    Past  Surgical History  Procedure Laterality Date  . Lithotripsy  2007  . Tracheostomy tube placement N/A 07/14/2014    Procedure: TRACHEOSTOMY;  Surgeon: Darletta MollSui W Teoh, MD;  [facility];  Service: ENT;  Laterality: N/A;     Social History:  The patient  reports that he quit smoking about 26 years ago. His smoking use included Cigarettes. He has a 7.5 pack-year smoking history. He has never used smokeless tobacco. He reports that he does not drink alcohol or use illicit drugs.   Family History:  The patient's family history includes Asthma in his mother; Cancer in his brother; Heart disease in his brother and brother.    ROS:  Please see the history of present illness. All other systems are reviewed and  Negative to the above problem except as noted.    PHYSICAL EXAM: VS:  BP 144/68 mmHg  Pulse 78  Ht 5\' 7"  (1.702 m)  Wt 302 lb (136.986 kg)  BMI 47.29 kg/m2  SpO2 93%  GEN: Morbidly obese in NAD   HEENT: normal Neck:  Trach Cardiac: RRR; no murmurs, rubs, or gallops  Chronic LE edema  Respiratory:  RElatively clear to auscultation bilaterally, normal work of breathing GI: Obese  Nontender   MS:  Moving all extremities   Skin: NO obvious rash    EKG:  EKG is not ordered today.  ON 10/12/14  Atrial fib     Lipid Panel    Component Value Date/Time   CHOL 119 11/04/2014 1549   TRIG 255.0* 11/04/2014 1549   HDL 27.30* 11/04/2014 1549   CHOLHDL 4 11/04/2014 1549   VLDL 51.0* 11/04/2014 1549   LDLCALC 65 02/25/2014 1211   LDLDIRECT 55.0 11/04/2014 1549      Wt Readings from Last 3 Encounters:  11/12/14 302 lb (136.986 kg)  11/04/14 306 lb (138.801 kg)  11/03/14 308 lb 3.2 oz (139.799 kg)      ASSESSMENT AND PLAN: 1  Atrial fib  Will continue rate control and coumadin WOuld repeat echo to reeval LVEF  Exam in hosp limited SOme volume increase on exam  Chronic. i am not sure pt would do better with rhythm control or that he could maintain it.   NO change in medicines  now    Signed, Dietrich PatesPaula Sharmila Wrobleski, MD  11/12/2014 11:51 AM    [facility] [address], [place], [place]  [address] Phone: [phone]; Fax: [phone]

## 2014-11-12 NOTE — Patient Instructions (Signed)
Medication Instructions:  No changes today.  Labwork: None today  Testing/Procedures: Your physician has requested that you have an echocardiogram. Echocardiography is a painless test that uses sound waves to create images of your heart. It provides your doctor with information about the size and shape of your heart and how well your heart's chambers and valves are working. This procedure takes approximately one hour. There are no restrictions for this procedure.    Follow-Up: Will be determined after echo has been completed.

## 2014-11-16 ENCOUNTER — Encounter: Payer: Medicare Other | Admitting: Neurology

## 2014-11-17 ENCOUNTER — Encounter: Payer: Self-pay | Admitting: Registered Nurse

## 2014-11-17 ENCOUNTER — Encounter: Payer: Medicare Other | Attending: Registered Nurse | Admitting: Registered Nurse

## 2014-11-17 VITALS — BP 138/48 | HR 81 | Resp 20

## 2014-11-17 DIAGNOSIS — K5909 Other constipation: Secondary | ICD-10-CM | POA: Insufficient documentation

## 2014-11-17 DIAGNOSIS — Z930 Tracheostomy status: Secondary | ICD-10-CM | POA: Diagnosis not present

## 2014-11-17 DIAGNOSIS — J9620 Acute and chronic respiratory failure, unspecified whether with hypoxia or hypercapnia: Secondary | ICD-10-CM | POA: Insufficient documentation

## 2014-11-17 DIAGNOSIS — Z760 Encounter for issue of repeat prescription: Secondary | ICD-10-CM | POA: Insufficient documentation

## 2014-11-17 DIAGNOSIS — M179 Osteoarthritis of knee, unspecified: Secondary | ICD-10-CM | POA: Diagnosis not present

## 2014-11-17 DIAGNOSIS — T40605A Adverse effect of unspecified narcotics, initial encounter: Secondary | ICD-10-CM | POA: Diagnosis not present

## 2014-11-17 DIAGNOSIS — G8929 Other chronic pain: Secondary | ICD-10-CM | POA: Diagnosis not present

## 2014-11-17 DIAGNOSIS — M545 Low back pain, unspecified: Secondary | ICD-10-CM

## 2014-11-17 DIAGNOSIS — M1711 Unilateral primary osteoarthritis, right knee: Secondary | ICD-10-CM

## 2014-11-17 DIAGNOSIS — M25561 Pain in right knee: Secondary | ICD-10-CM | POA: Insufficient documentation

## 2014-11-17 DIAGNOSIS — Z5181 Encounter for therapeutic drug level monitoring: Secondary | ICD-10-CM | POA: Diagnosis not present

## 2014-11-17 DIAGNOSIS — Z79899 Other long term (current) drug therapy: Secondary | ICD-10-CM

## 2014-11-17 MED ORDER — OXYCODONE-ACETAMINOPHEN 7.5-325 MG PO TABS: ORAL_TABLET | ORAL | Status: DC

## 2014-11-17 NOTE — Progress Notes (Signed)
Subjective:    Patient ID: Blake Summers, male    DOB: 1945/12/05, 69 y.o.   MRN: [id number]  HPI: [name] is a 69 year old male who returns for follow up for chronic pain and medication refill. He says his pain is located in his lower back and right knee. He rates his pain 7. His current exercise regime is performing stretching exercises. He arrived to clinic in his motorized wheelchair. Has bilateral velcro wraps to lower extremities  Pain Inventory Average Pain 7 Pain Right Now 7 My pain is sharp, stabbing and aching  In the last 24 hours, has pain interfered with the following? General activity 8 Relation with others 7 Enjoyment of life 8 What TIME of day is your pain at its worst? daytime and evening Sleep (in general) Fair  Pain is worse with: walking and standing Pain improves with: rest, heat/ice, medication and injections Relief from Meds: 7  Mobility walk with assistance use a cane use a walker how many minutes can you walk? 0 ability to climb steps?  no do you drive?  yes use a wheelchair needs help with transfers  Function disabled: date disabled . retired I need assistance with the following:  dressing, bathing, meal prep, household duties and shopping  Neuro/Psych weakness numbness trouble walking dizziness  Prior Studies Any changes since last visit?  no  Physicians involved in your care Any changes since last visit?  no   Family History  Problem Relation Age of Onset  . Cancer Brother     Colon Cancer  . Heart disease Brother   . Heart disease Brother   . Asthma Mother    History   Social History  . Marital Status: Married    Spouse Name: N/A  . Number of Children: N/A  . Years of Education: N/A   Occupational History  . Retired     Nature conservation officer   Social History Main Topics  . Smoking status: Former Smoker -- 0.50 packs/day for 15 years    Types: Cigarettes    Quit date: 06/12/1988  . Smokeless tobacco:  Never Used  . Alcohol Use: No  . Drug Use: No  . Sexual Activity: Not on file   Other Topics Concern  . None   Social History Narrative   Diet is "good"   Exercise is limited by medical problems   Past Surgical History  Procedure Laterality Date  . Lithotripsy  2007  . Tracheostomy tube placement N/A 07/14/2014    Procedure: TRACHEOSTOMY;  Surgeon: Darletta Moll, MD;  [facility] OR;  Service: ENT;  Laterality: N/A;   Past Medical History  Diagnosis Date  . GERD 07/06/2010  . Edema 10/28/2007  . DIABETES MELLITUS, TYPE II 05/04/2007  . HYPERLIPIDEMIA 07/08/2008  . ALLERGIC RHINITIS 07/08/2008  . GLAUCOMA 07/08/2008  . CEREBROVASCULAR ACCIDENT, HX OF 07/08/2008  . OBSTRUCTIVE SLEEP APNEA 03/06/2008  . DIABETIC ULCER, LEFT LEG 08/23/2009  . 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 HYPERTENSION 10/28/2007  . VENOUS INSUFFICIENCY 03/08/2009  . FATTY LIVER DISEASE 05/19/2008  . BENIGN PROSTATIC HYPERTROPHY 07/08/2008  . 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 NEPHROLITHIASIS, HX OF 07/08/2008  . Morbid obesity   . DM nephropathy/sclerosis   . 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 Lumbar spondylosis   . Diarrhea 06/08/2013   BP 138/48 mmHg  Pulse 81  Resp 20  SpO2 88%  Opioid Risk Score:   Fall Risk Score:  High Fall Risk (>13 points) (previously educated and given handout)`1  Depression screen PHQ 2/9  Depression screen PHQ 2/9 10/20/2014  Decreased Interest 0  Down, Depressed, Hopeless 0  PHQ - 2 Score 0  Altered sleeping 0  Tired, decreased energy 2  Change in appetite 0  Feeling bad or failure about yourself  0  Trouble concentrating 0  Moving slowly or fidgety/restless 0  Suicidal thoughts 0  PHQ-9 Score 2     Review of Systems  Gastrointestinal: Positive for constipation.  Musculoskeletal: Positive for gait problem.  Neurological: Positive for dizziness, weakness and numbness.  All other systems reviewed and are negative.      Objective:    Physical Exam  Constitutional: He is oriented to person, place, and time. He appears well-developed and well-nourished.  HENT:  Head: Normocephalic and atraumatic.  Neck: Normal range of motion. Neck supple.  Cardiovascular: Normal rate and regular rhythm.   Pulmonary/Chest: Effort normal and breath sounds normal.  Continuous Oxygen at 3 liters nasal cannula Trach with Passy Muir Valve  Musculoskeletal:  Normal Muscle Bulk and Muscle Testing Reveals:  Upper Extremities: Full ROM and Muscle Strength 5/5 Lumbar Paraspinal Tenderness: L-3- L-5 Lower Extremities: Full ROM and Muscle Strength 5/5 Right Lower extremity Flexion Produces pain into Patella Bilateral Velcro Wraps Intact Arrived in Motorized wheelchair  Neurological: He is alert and oriented to person, place, and time.  Skin: Skin is warm and dry.  Psychiatric: He has a normal mood and affect.  Nursing note and vitals reviewed.         Assessment & Plan:  1. End-stage right knee osteoarthritis: Continue to use Voltaren gel  Continue MS contin 15 mg #60. One tablet every 12 hours andRefilled: Percocet 7.5/325 mg # 90 pills--use every four hours as needed for pain. No more than 3 a day. Second script given to accommodate scheduled appointment. 2. Morbid obesity with generalized deconditioning:  Continue: Encourage increase activity. 3. Chronic low back pain/spondylosis: Continue MS contin with prn percocet.  5. Narcotic induced constipation: Continue with Bowel Program: Senna, Colace, Miralax as prescribed. 6. Acute on Chronic Respiratory Failure: S/P Tracheostomy/ PMV/ Vent at HS.  20 minutes of face to face patient care time was spent during this visit. All questions were encouraged and answered.   F/U in 1 month

## 2014-11-20 ENCOUNTER — Other Ambulatory Visit: Payer: Self-pay

## 2014-11-20 ENCOUNTER — Ambulatory Visit (HOSPITAL_COMMUNITY): Payer: Medicare Other | Attending: Internal Medicine

## 2014-11-20 DIAGNOSIS — G4733 Obstructive sleep apnea (adult) (pediatric): Secondary | ICD-10-CM | POA: Insufficient documentation

## 2014-11-20 DIAGNOSIS — Z87891 Personal history of nicotine dependence: Secondary | ICD-10-CM | POA: Insufficient documentation

## 2014-11-20 DIAGNOSIS — E230 Hypopituitarism: Secondary | ICD-10-CM | POA: Insufficient documentation

## 2014-11-20 DIAGNOSIS — I872 Venous insufficiency (chronic) (peripheral): Secondary | ICD-10-CM | POA: Diagnosis not present

## 2014-11-20 DIAGNOSIS — J9610 Chronic respiratory failure, unspecified whether with hypoxia or hypercapnia: Secondary | ICD-10-CM | POA: Insufficient documentation

## 2014-11-20 DIAGNOSIS — I10 Essential (primary) hypertension: Secondary | ICD-10-CM | POA: Insufficient documentation

## 2014-11-20 DIAGNOSIS — I482 Chronic atrial fibrillation, unspecified: Secondary | ICD-10-CM

## 2014-11-20 DIAGNOSIS — E669 Obesity, unspecified: Secondary | ICD-10-CM | POA: Diagnosis not present

## 2014-11-20 DIAGNOSIS — K219 Gastro-esophageal reflux disease without esophagitis: Secondary | ICD-10-CM | POA: Insufficient documentation

## 2014-11-20 DIAGNOSIS — K760 Fatty (change of) liver, not elsewhere classified: Secondary | ICD-10-CM | POA: Insufficient documentation

## 2014-11-20 DIAGNOSIS — R0602 Shortness of breath: Secondary | ICD-10-CM

## 2014-11-20 MED ORDER — PERFLUTREN LIPID MICROSPHERE: 1.0000 mL | INTRAVENOUS | Status: AC | PRN

## 2014-11-20 MED ORDER — PERFLUTREN LIPID MICROSPHERE: 1.0000 mL | Freq: Once | INTRAVENOUS | Status: DC

## 2014-11-24 ENCOUNTER — Telehealth: Payer: Self-pay | Admitting: *Deleted

## 2014-11-24 NOTE — Telephone Encounter (Signed)
-----   Message from Pricilla Riffle, MD sent at 11/23/2014 11:06 PM EDT ----- Pumping function of the heart appears normal. Upper chambers are enlarged. I would keep on same medicines.  Plan for rate continued rate control I do not thik he would maintain SR

## 2014-11-24 NOTE — Telephone Encounter (Signed)
error 

## 2014-12-01 ENCOUNTER — Other Ambulatory Visit: Payer: Self-pay

## 2014-12-01 ENCOUNTER — Ambulatory Visit: Payer: Medicare Other

## 2014-12-01 ENCOUNTER — Ambulatory Visit (INDEPENDENT_AMBULATORY_CARE_PROVIDER_SITE_OTHER): Payer: Medicare Other | Admitting: General Practice

## 2014-12-01 DIAGNOSIS — Z8679 Personal history of other diseases of the circulatory system: Secondary | ICD-10-CM

## 2014-12-01 DIAGNOSIS — Z5181 Encounter for therapeutic drug level monitoring: Secondary | ICD-10-CM

## 2014-12-01 DIAGNOSIS — I4891 Unspecified atrial fibrillation: Secondary | ICD-10-CM

## 2014-12-01 LAB — POCT INR: INR: 2.8

## 2014-12-01 NOTE — Progress Notes (Signed)
Pre visit review using our clinic review tool, if applicable. No additional management support is needed unless otherwise documented below in the visit note. 

## 2014-12-01 NOTE — Progress Notes (Signed)
I have reviewed and agree with the plan. 

## 2014-12-07 ENCOUNTER — Ambulatory Visit: Payer: Medicare Other | Admitting: Endocrinology

## 2014-12-08 ENCOUNTER — Encounter: Payer: Self-pay | Admitting: Dietician

## 2014-12-08 ENCOUNTER — Encounter: Payer: Medicare Other | Attending: Endocrinology | Admitting: Dietician

## 2014-12-08 VITALS — Ht 67.0 in | Wt 310.0 lb

## 2014-12-08 DIAGNOSIS — Z794 Long term (current) use of insulin: Secondary | ICD-10-CM | POA: Insufficient documentation

## 2014-12-08 DIAGNOSIS — E1159 Type 2 diabetes mellitus with other circulatory complications: Secondary | ICD-10-CM | POA: Diagnosis not present

## 2014-12-08 DIAGNOSIS — Z713 Dietary counseling and surveillance: Secondary | ICD-10-CM | POA: Insufficient documentation

## 2014-12-08 DIAGNOSIS — Z6841 Body Mass Index (BMI) 40.0 and over, adult: Secondary | ICD-10-CM | POA: Insufficient documentation

## 2014-12-08 NOTE — Patient Instructions (Signed)
Plan:  Aim for 4 Carb Choices per meal (60 grams) +/- 1 either way  Aim for 0-2 Carbs per snack if hungry.  Raw veges make a good snack Include protein in moderation with your meals and snacks Consider reading food labels for Total Carbohydrate and Fat Grams of foods Be mindful about sodium/salt intake.  Avoid adding additional salt and choose low sodium foods as much as possible.

## 2014-12-08 NOTE — Progress Notes (Signed)
Medical Nutrition Therapy:  Appt start time: 1445 end time:  1600.   Assessment:  Primary concerns today: Patient is here today with his wife.  Wife would like to know if they are doing the right thing with patient's nutrition to help him lose weight and control his blood sugar.  Hx includes type 2 diabetes for the past 20 years.  Hx also includes obstructive sleep He presents in a wheel chair.  He is oxygen dependent and has a trach.  He connects himself to a ventilator each time he lays down.  Recent hospitalization Jan 27-April 28 ([facility]).   HgbA1C 7.7% 11/04/14.  Checks his blood sugar twice daily and wife reports improvement in numbers since he is back home.    Patient lives with his wife.  They watch grandchildren at times.  Wife does the shopping and cooking.  Eats out rarely.  Patient is concerned that a very low calorie diet would not be sustainable.  He complains of increased hunger and eats increased snacks.  When at home, he is in his room almost all of the time.  He eats all meals and snacks in front of the TV.  He is unwilling to change this.  Patient reports desire to lose weight and the benefits that it would have on improving his health.  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  Wife is extremely supportive.  Preferred Learning Style:   No preference indicated   Learning Readiness:   Contemplating  MEDICATIONS: see list to include 30 units Novolog with each meal and 45 units lantus every HS.   DIETARY INTAKE: Patient states that he does not add any added salt to foods.  Wife grows her own herbs and vegetables. 24-hr recall:  B (8-9 AM): yogurt or cottage cheese with fruit, 6 oz juice, and 1 English muffin with 2 slices ham and cheese.  OR cereal or oatmeal and  2% milk and cottage cheese or yogurt. OR french toast or blueberry muffin with yogurt or cottage cheese or scrambled egg whites, [place] sausage and english muffin. Snk ( AM): none  L (1-2 PM):   Soup and [place] Sandwich on honey wheat and cottage cheese on romaine lettuce with pears or pineapple and 4 crackers Snk ( PM): snacks at times on similar snacks as bedtime D ( PM): chicken or salmon or ham or hamburger starch or bread,  Snk ( PM): sugar free candy or granola bar or teddy grahams or cookies. Beverages: 1% milk, water, sugar free lemonade, coffee with half and half, hot tea  Usual physical activity: ADL's.  , dumbbells, resistance bands and other armchair exercises, Just built a pool for patient to do water exercises.  Will use a hoyer lift to get in.    Estimated energy needs: 1800 calories 200 g carbohydrates 135 g protein 50 g fat  Progress Towards Goal(s):  In progress.   Nutritional Diagnosis:  NB-1.1 Food and nutrition-related knowledge deficit As related to balance of carbohydrate, protein and fat.  As evidenced by diet hx.    Intervention:  Nutrition counseling and diabetes education initiated. Discussed Carb Counting by food group as method of portion control, reading food labels, and benefits of increased activity. Also discussed basic physiology of Diabetes, target BG ranges pre and post meals, and A1c.  . Plan:  Aim for 4 Carb Choices per meal (60 grams) +/- 1 either way  Aim for 0-2 Carbs per snack if hungry.  Raw  veges make a good snack Include protein in moderation with your meals and snacks Consider reading food labels for Total Carbohydrate and Fat Grams of foods Be mindful about sodium/salt intake.  Avoid adding additional salt and choose low sodium foods as much as possible.   Teaching Method Utilized:  Visual Auditory Hands on  Handouts given during visit include:  Meal plan card  Label reading  My plates  Snack list  Barriers to learning/adherence to lifestyle change: overall health  Demonstrated degree of understanding via:  Teach Back   Monitoring/Evaluation:  Dietary intake, exercise, label reading, and body weight in 2  month(s).

## 2014-12-09 ENCOUNTER — Other Ambulatory Visit: Payer: Self-pay | Admitting: General Practice

## 2014-12-09 ENCOUNTER — Other Ambulatory Visit: Payer: Self-pay | Admitting: Endocrinology

## 2014-12-09 ENCOUNTER — Other Ambulatory Visit: Payer: Self-pay

## 2014-12-09 ENCOUNTER — Telehealth: Payer: Self-pay | Admitting: Endocrinology

## 2014-12-09 MED ORDER — INSULIN GLARGINE 100 UNIT/ML SOLOSTAR PEN: 45.0000 [IU] | PEN_INJECTOR | Freq: Every day | SUBCUTANEOUS | Status: DC

## 2014-12-09 MED ORDER — INSULIN GLARGINE 100 UNIT/ML ~~LOC~~ SOLN: 45.0000 [IU] | Freq: Every day | SUBCUTANEOUS | Status: DC

## 2014-12-09 MED ORDER — WARFARIN SODIUM 6 MG PO TABS: 12.0000 mg | ORAL_TABLET | Freq: Once | ORAL | Status: DC

## 2014-12-09 NOTE — Telephone Encounter (Signed)
Patients pharmacy has already been notified per cindy/coumadin clinic

## 2014-12-09 NOTE — Telephone Encounter (Signed)
Patient states that the drug store will not fill coumadin because directions and dosage is not on script.  Patient's wife is very upset.  Patient uses [facility].

## 2014-12-10 ENCOUNTER — Other Ambulatory Visit: Payer: Self-pay | Admitting: *Deleted

## 2014-12-10 ENCOUNTER — Other Ambulatory Visit: Payer: Self-pay

## 2014-12-10 DIAGNOSIS — G609 Hereditary and idiopathic neuropathy, unspecified: Secondary | ICD-10-CM

## 2014-12-10 MED ORDER — PRAVASTATIN SODIUM 40 MG PO TABS: 40.0000 mg | ORAL_TABLET | Freq: Every day | ORAL | Status: DC

## 2014-12-10 MED ORDER — DILTIAZEM HCL 60 MG PO TABS: 60.0000 mg | ORAL_TABLET | Freq: Three times a day (TID) | ORAL | Status: DC

## 2014-12-15 ENCOUNTER — Ambulatory Visit (INDEPENDENT_AMBULATORY_CARE_PROVIDER_SITE_OTHER): Payer: Medicare Other | Admitting: Neurology

## 2014-12-15 DIAGNOSIS — G609 Hereditary and idiopathic neuropathy, unspecified: Secondary | ICD-10-CM | POA: Diagnosis not present

## 2014-12-15 DIAGNOSIS — E0842 Diabetes mellitus due to underlying condition with diabetic polyneuropathy: Secondary | ICD-10-CM

## 2014-12-15 DIAGNOSIS — G5603 Carpal tunnel syndrome, bilateral upper limbs: Secondary | ICD-10-CM

## 2014-12-15 NOTE — Procedures (Signed)
[facility]  [address], [address]  [place], [place] [address] Tel: [phone] Fax:  [phone] Test Date:  12/15/2014  Patient: Blake Summers DOB: 09-13-1945 Physician: Nita Sickleonika Sreshta Cressler, DO  Sex: Male Height: 5\' 7"  Ref Phys: Romero BellingSean Ellison, M.D.  ID#: [id number] Temp: 34.3C Technician: Judie PetitM. Dean   Patient Complaints: This is a 69 year old insulin-dependent diabetic presenting for evaluation of bilateral hand numbness, worse on the left.   NCV & EMG Findings: Extensive electrodiagnostic testing of the left upper extremity and additional studies of the right shows:  1. Bilateral median, radial, and right ulnar sensory responses are absent. The left ulnar sensory nerve showed prolonged distal peak latency (3.3 ms). 2. Bilateral median motor responses are prolonged with reduced amplitude (L6.1, R5.3 ms) and reduced amplitude (L1.5, R2.8 mV). Maximal stimulation of bilateral median nerves and the right ulnar nerve was not tolerated by patient, therefore, some of these findings may be technical in nature. 3. Chronic motor axon loss changes are isolated to the abductor pollicis brevis muscles bilaterally, without accompanied active denervation.  Impression: 1. 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 2. 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 3. There is no definite evidence of cervical radiculopathy affecting the upper extremities.    ___________________________ Nita Sickleonika Rahmel Nedved, DO    Nerve Conduction Studies Anti Sensory Summary Table   Site NR Peak (ms) Norm Peak (ms) P-T Amp (V) Norm P-T Amp  Left Median Anti Sensory (2nd Digit)  34.3C  Wrist NR  <3.8  >10  Right Median Anti Sensory (2nd Digit)  34.3C  Wrist NR  <3.8  >10  Left Radial Anti Sensory (Base 1st Digit)   34.3C  Wrist NR  <2.8  >10  Right Radial Anti Sensory (Base 1st Digit)  34.3C  Wrist NR  <2.8  >10  Left Ulnar Anti Sensory (5th Digit)  34.3C  Wrist    3.3 <3.2 5.3 >5  Right Ulnar Anti Sensory (5th Digit)  34.3C  Wrist NR  <3.2  >5   Motor Summary Table   Site NR Onset (ms) Norm Onset (ms) O-P Amp (mV) Norm O-P Amp Site1 Site2 Delta-0 (ms) Dist (cm) Vel (m/s) Norm Vel (m/s)  Left Median Motor (Abd Poll Brev)  34.3C    Paient did not tolerate proximal stimulation  Wrist    6.1 <4.0 1.5 >5 Elbow Wrist 5.2 27.0 52 >50  Elbow    11.3  0.2         Right Median Motor (Abd Poll Brev)  34.3C  Wrist    5.3 <4.0 2.8 >5 Elbow Wrist 5.2 26.0 50 >50  Elbow    10.5  1.8         Erbs    13.8  0.0         Left Ulnar Motor (Abd Dig Minimi)  34.3C  Wrist    3.0 <3.1 7.1 >7 B Elbow Wrist 5.4 28.0 52 >50  B Elbow    8.4  5.9  A Elbow B Elbow 1.8 10.0 56 >50  A Elbow    10.2  6.0         Right Ulnar Motor (Abd Dig Minimi)  34.3C    unable to tolerate stim  Wrist    3.1 <3.1 6.7 >7 B Elbow Wrist 4.9 25.0 51 >50  B Elbow  8.0  6.1  A Elbow B Elbow 1.5 10.0 67 >50  A Elbow    9.5  5.3          EMG   Side Muscle Ins Act Fibs Psw Fasc Number Recrt Dur Dur. Amp Amp. Poly Poly. Comment  Left 1stDorInt Nml Nml Nml Nml Nml Nml Nml Nml Nml Nml Nml Nml N/A  Left Abd Poll Brev Nml Nml Nml Nml 1- Mod-R Few 1+ Few 1+ Nml Nml N/A  Left Ext Indicis Nml Nml Nml Nml Nml Nml Nml Nml Nml Nml Nml Nml N/A  Left PronatorTeres Nml Nml Nml Nml Nml Nml Nml Nml Nml Nml Nml Nml N/A  Left Biceps Nml Nml Nml Nml Nml Nml Nml Nml Nml Nml Nml Nml N/A  Left Triceps Nml Nml Nml Nml Nml Nml Nml Nml Nml Nml Nml Nml N/A  Right 1stDorInt Nml Nml Nml Nml Nml Nml Nml Nml Nml Nml Nml Nml N/A  Right Abd Poll Brev Nml Nml Nml Nml 1- Mod-R Few 1+ Few 1+ Nml Nml N/A  Right PronatorTeres Nml Nml Nml Nml Nml Nml Nml Nml Nml Nml Nml Nml N/A      Waveforms:

## 2014-12-22 ENCOUNTER — Other Ambulatory Visit: Payer: Self-pay

## 2014-12-22 MED ORDER — TAMSULOSIN HCL 0.4 MG PO CAPS: 0.4000 mg | ORAL_CAPSULE | Freq: Every day | ORAL | Status: DC

## 2014-12-29 ENCOUNTER — Ambulatory Visit (INDEPENDENT_AMBULATORY_CARE_PROVIDER_SITE_OTHER): Payer: Medicare Other | Admitting: General Practice

## 2014-12-29 ENCOUNTER — Encounter: Payer: Self-pay | Admitting: Registered Nurse

## 2014-12-29 ENCOUNTER — Encounter: Payer: Medicare Other | Attending: Registered Nurse | Admitting: Registered Nurse

## 2014-12-29 ENCOUNTER — Other Ambulatory Visit: Payer: Self-pay | Admitting: Registered Nurse

## 2014-12-29 VITALS — BP 123/78 | HR 71 | Resp 16

## 2014-12-29 DIAGNOSIS — Z5181 Encounter for therapeutic drug level monitoring: Secondary | ICD-10-CM | POA: Diagnosis not present

## 2014-12-29 DIAGNOSIS — I4891 Unspecified atrial fibrillation: Secondary | ICD-10-CM | POA: Diagnosis not present

## 2014-12-29 DIAGNOSIS — G8929 Other chronic pain: Secondary | ICD-10-CM | POA: Diagnosis not present

## 2014-12-29 DIAGNOSIS — M179 Osteoarthritis of knee, unspecified: Secondary | ICD-10-CM | POA: Insufficient documentation

## 2014-12-29 DIAGNOSIS — T40605A Adverse effect of unspecified narcotics, initial encounter: Secondary | ICD-10-CM | POA: Insufficient documentation

## 2014-12-29 DIAGNOSIS — K5909 Other constipation: Secondary | ICD-10-CM | POA: Insufficient documentation

## 2014-12-29 DIAGNOSIS — M1711 Unilateral primary osteoarthritis, right knee: Secondary | ICD-10-CM

## 2014-12-29 DIAGNOSIS — Z930 Tracheostomy status: Secondary | ICD-10-CM | POA: Diagnosis not present

## 2014-12-29 DIAGNOSIS — M545 Low back pain, unspecified: Secondary | ICD-10-CM

## 2014-12-29 DIAGNOSIS — Z760 Encounter for issue of repeat prescription: Secondary | ICD-10-CM | POA: Insufficient documentation

## 2014-12-29 DIAGNOSIS — J9620 Acute and chronic respiratory failure, unspecified whether with hypoxia or hypercapnia: Secondary | ICD-10-CM | POA: Insufficient documentation

## 2014-12-29 DIAGNOSIS — M25561 Pain in right knee: Secondary | ICD-10-CM | POA: Diagnosis not present

## 2014-12-29 DIAGNOSIS — Z79899 Other long term (current) drug therapy: Secondary | ICD-10-CM

## 2014-12-29 DIAGNOSIS — Z8679 Personal history of other diseases of the circulatory system: Secondary | ICD-10-CM

## 2014-12-29 LAB — POCT INR: INR: 2.5

## 2014-12-29 MED ORDER — MORPHINE SULFATE ER 15 MG PO TBCR: 15.0000 mg | EXTENDED_RELEASE_TABLET | Freq: Two times a day (BID) | ORAL | Status: DC

## 2014-12-29 MED ORDER — OXYCODONE-ACETAMINOPHEN 7.5-325 MG PO TABS: ORAL_TABLET | ORAL | Status: DC

## 2014-12-29 NOTE — Progress Notes (Signed)
Subjective:    Patient ID: Blake Summers, male    DOB: Oct 12, 1945, 69 y.o.   MRN: [id number]  HPI: Mr. MEREL SANTOLI is a 69 year old male who returns for follow up for chronic pain and medication refill. He says his pain is located in his lower back and right knee. He rates his pain 7. His current exercise regime is performing stretching exercises. He arrived to clinic in his motorized wheelchair. Has bilateral velcro wraps to lower extremities. Spoke with Mr. And Mrs. Ivanov regarding swimming. Mr. Tabar stated he was afraid encouraged wife to understand his opinion she verbalizes understanding.  Spent 45 minutes all questions answered.  Pain Inventory Average Pain 7 Pain Right Now 7 My pain is sharp, stabbing and aching  In the last 24 hours, has pain interfered with the following? General activity 8 Relation with others 7 Enjoyment of life 8 What TIME of day is your pain at its worst? daytime Sleep (in general) Poor  Pain is worse with: walking, bending and standing Pain improves with: rest, heat/ice, medication and injections Relief from Meds: 7  Mobility walk with assistance use a cane use a walker ability to climb steps?  no do you drive?  yes use a wheelchair needs help with transfers  Function disabled: date disabled . retired I need assistance with the following:  dressing, bathing, meal prep, household duties and shopping  Neuro/Psych weakness numbness trouble walking dizziness  Prior Studies Any changes since last visit?  no  Physicians involved in your care Any changes since last visit?  no   Family History  Problem Relation Age of Onset  . Cancer Brother     Colon Cancer  . Heart disease Brother   . Heart disease Brother   . Asthma Mother    History   Social History  . Marital Status: Married    Spouse Name: N/A  . Number of Children: N/A  . Years of Education: N/A   Occupational History  . Retired     Nature conservation officer    Social History Main Topics  . Smoking status: Former Smoker -- 0.50 packs/day for 15 years    Types: Cigarettes    Quit date: 06/12/1988  . Smokeless tobacco: Never Used  . Alcohol Use: No  . Drug Use: No  . Sexual Activity: Not on file   Other Topics Concern  . None   Social History Narrative   Diet is "good"   Exercise is limited by medical problems   Past Surgical History  Procedure Laterality Date  . Lithotripsy  2007  . Tracheostomy tube placement N/A 07/14/2014    Procedure: TRACHEOSTOMY;  Surgeon: Darletta Moll, MD;  Location: [facility] OR;  Service: ENT;  Laterality: N/A;   Past Medical History  Diagnosis Date  . GERD 07/06/2010  . Edema 10/28/2007  . DIABETES MELLITUS, TYPE II 05/04/2007  . HYPERLIPIDEMIA 07/08/2008  . ALLERGIC RHINITIS 07/08/2008  . GLAUCOMA 07/08/2008  . CEREBROVASCULAR ACCIDENT, HX OF 07/08/2008  . OBSTRUCTIVE SLEEP APNEA 03/06/2008  . DIABETIC ULCER, LEFT LEG 08/23/2009  . 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 HYPERTENSION 10/28/2007  . VENOUS INSUFFICIENCY 03/08/2009  . FATTY LIVER DISEASE 05/19/2008  . BENIGN PROSTATIC HYPERTROPHY 07/08/2008  . 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 NEPHROLITHIASIS, HX OF 07/08/2008  . Morbid obesity   . DM nephropathy/sclerosis   . 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 Lumbar spondylosis   . Diarrhea 06/08/2013  BP 123/78 mmHg  Pulse 71  Resp 16  SpO2 93%  Opioid Risk Score:   Fall Risk Score:  `1  Depression screen PHQ 2/9  [facility] 12/29/2014 12/08/2014 10/20/2014  Decreased Interest 0 0 0  Down, Depressed, Hopeless 0 0 0  PHQ - 2 Score 0 0 0  Altered sleeping - - 0  Tired, decreased energy - - 2  Change in appetite - - 0  Feeling bad or failure about yourself  - - 0  Trouble concentrating - - 0  Moving slowly or fidgety/restless - - 0  Suicidal thoughts - - 0  PHQ-9 Score - - 2     Review of Systems  Musculoskeletal: Positive for gait problem.  Neurological:  Positive for dizziness, weakness and numbness.  All other systems reviewed and are negative.      Objective:   Physical Exam  Constitutional: He is oriented to person, place, and time. He appears well-developed and well-nourished.  HENT:  Head: Normocephalic and atraumatic.  Neck: Neck supple.  Cardiovascular: Normal rate and regular rhythm.   Pulmonary/Chest:  Continuous Oxygen 2 Liters Nasal Cannula Passy Muir Vavle Intact  Musculoskeletal:  Normal Muscle Bulk and Muscle Testing Reveals: Upper Extremities: Full ROM and Muscle Strength 5/5 Lumbar Paraspinal Tenderness: L-3- L-5 Lower Extremities: Full ROM and Muscle Strength 5/5 Right Lower Extremity Flexion Produces pain into Right Patella  Arrived in Motorized wheelchair  Neurological: He is alert and oriented to person, place, and time.  Skin: Skin is warm and dry.  Psychiatric: He has a normal mood and affect.  Nursing note and vitals reviewed.         Assessment & Plan:  1. End-stage right knee osteoarthritis: Continue to use Voltaren gel  Continue MS contin 15 mg #60. One tablet every 12 hours andRefilled: Percocet 7.5/325 mg # 90 pills--use every four hours as needed for pain. No more than 3 a day. Second script of Morphine given to accommodate scheduled appointment. 2. Morbid obesity with generalized deconditioning:  Continue: Encourage increase activity. 3. Chronic low back pain/spondylosis: Continue MS contin with prn percocet.  5. Narcotic induced constipation: Continue with Bowel Program: Senna, Colace, Miralax as prescribed. 6. Acute on Chronic Respiratory Failure: S/P Tracheostomy/ PMV/ Vent at HS.  20 minutes of face to face patient care time was spent during this visit. All questions were encouraged and answered.   F/U in 1 month

## 2014-12-29 NOTE — Progress Notes (Signed)
I have reviewed and agree with the plan. 

## 2014-12-29 NOTE — Progress Notes (Signed)
Pre visit review using our clinic review tool, if applicable. No additional management support is needed unless otherwise documented below in the visit note. 

## 2014-12-30 LAB — PMP ALCOHOL METABOLITE (ETG): ETGU: NEGATIVE ng/mL

## 2015-01-02 LAB — OPIATES/OPIOIDS (LC/MS-MS)
Codeine Urine: NEGATIVE ng/mL (ref <50)
HYDROMORPHONE: 50 ng/mL (ref <50)
Hydrocodone: NEGATIVE ng/mL (ref <50)
Morphine Urine: 5482 ng/mL (ref <50)
NORHYDROCODONE, UR: NEGATIVE ng/mL (ref <50)
Noroxycodone, Ur: 1003 ng/mL (ref <50)
OXYCODONE, UR: 1685 ng/mL (ref <50)
Oxymorphone: 916 ng/mL (ref <50)

## 2015-01-02 LAB — OXYCODONE, URINE (LC/MS-MS)
Noroxycodone, Ur: 1003 ng/mL (ref <50)
OXYMORPHONE, URINE: 916 ng/mL (ref <50)
Oxycodone, ur: 1685 ng/mL (ref <50)

## 2015-01-02 LAB — AMPHETAMINES (GC/LC/MS), URINE
Amphetamine GC/MS Conf: NEGATIVE ng/mL (ref <250)
Methamphetamine Quant, Ur: NEGATIVE ng/mL (ref <250)

## 2015-01-04 ENCOUNTER — Ambulatory Visit (INDEPENDENT_AMBULATORY_CARE_PROVIDER_SITE_OTHER)
Admission: RE | Admit: 2015-01-04 | Discharge: 2015-01-04 | Disposition: A | Discharge status: Home / Self Care | Payer: Medicare Other | Source: Ambulatory Visit | Attending: Internal Medicine | Admitting: Internal Medicine

## 2015-01-04 ENCOUNTER — Encounter: Payer: Self-pay | Admitting: Internal Medicine

## 2015-01-04 ENCOUNTER — Other Ambulatory Visit (INDEPENDENT_AMBULATORY_CARE_PROVIDER_SITE_OTHER): Payer: Medicare Other

## 2015-01-04 ENCOUNTER — Ambulatory Visit (INDEPENDENT_AMBULATORY_CARE_PROVIDER_SITE_OTHER): Payer: Medicare Other | Admitting: Internal Medicine

## 2015-01-04 VITALS — BP 142/76 | HR 77 | Ht 67.0 in | Wt 322.0 lb

## 2015-01-04 DIAGNOSIS — R0600 Dyspnea, unspecified: Secondary | ICD-10-CM | POA: Diagnosis not present

## 2015-01-04 DIAGNOSIS — J9612 Chronic respiratory failure with hypercapnia: Secondary | ICD-10-CM

## 2015-01-04 DIAGNOSIS — R9389 Abnormal findings on diagnostic imaging of other specified body structures: Secondary | ICD-10-CM

## 2015-01-04 DIAGNOSIS — R938 Abnormal findings on diagnostic imaging of other specified body structures: Secondary | ICD-10-CM

## 2015-01-04 LAB — CBC WITH DIFFERENTIAL/PLATELET
BASOS ABS: 0 10*3/uL (ref 0.0–0.1)
Basophils Relative: 0.1 % (ref 0.0–3.0)
EOS ABS: 0.5 10*3/uL (ref 0.0–0.7)
Eosinophils Relative: 4.2 % (ref 0.0–5.0)
HEMATOCRIT: 38.8 % — AB (ref 39.0–52.0)
HEMOGLOBIN: 12.4 g/dL — AB (ref 13.0–17.0)
LYMPHS ABS: 0.7 10*3/uL (ref 0.7–4.0)
Lymphocytes Relative: 5.4 % — ABNORMAL LOW (ref 12.0–46.0)
MCHC: 32 g/dL (ref 30.0–36.0)
MCV: 80.6 fl (ref 78.0–100.0)
MONOS PCT: 5.5 % (ref 3.0–12.0)
Monocytes Absolute: 0.7 10*3/uL (ref 0.1–1.0)
Neutro Abs: 10.3 10*3/uL — ABNORMAL HIGH (ref 1.4–7.7)
Neutrophils Relative %: 84.8 % — ABNORMAL HIGH (ref 43.0–77.0)
Platelets: 260 10*3/uL (ref 150.0–400.0)
RBC: 4.82 Mil/uL (ref 4.22–5.81)
RDW: 15.6 % — AB (ref 11.5–15.5)
WBC: 12.2 10*3/uL — AB (ref 4.0–10.5)

## 2015-01-04 LAB — BASIC METABOLIC PANEL
BUN: 11 mg/dL (ref 6–23)
CHLORIDE: 98 meq/L (ref 96–112)
CO2: 35 mEq/L — ABNORMAL HIGH (ref 19–32)
CREATININE: 0.7 mg/dL (ref 0.40–1.50)
Calcium: 8.9 mg/dL (ref 8.4–10.5)
GFR: 118.52 mL/min (ref >60.00)
Glucose, Bld: 251 mg/dL — ABNORMAL HIGH (ref 70–99)
POTASSIUM: 3.9 meq/L (ref 3.5–5.1)
SODIUM: 140 meq/L (ref 135–145)

## 2015-01-04 LAB — BRAIN NATRIURETIC PEPTIDE: Pro B Natriuretic peptide (BNP): 116 pg/mL — ABNORMAL HIGH (ref 0.0–100.0)

## 2015-01-04 LAB — TSH: TSH: 0.98 u[IU]/mL (ref 0.35–4.50)

## 2015-01-04 NOTE — Progress Notes (Signed)
Subjective:     Patient ID: Blake Summers, male   DOB: 19-Aug-1945, 69 y.o.   MRN: [id number]  HPI   45 yowm OHS  last doing well in May 2016 sleeping 45 degreees HOB on home vent and 02 and during  Day just 02 3lpm and pmv   01/04/2015 1st [place]   office visit/ [name]   Chief Complaint  Patient presents with  . Follow-up    Former pt of Dr Shelle Iron. Pt c/o increased SOB with exertion- relates to hot weather.   cc worse sob since May 2016  lots of nasal drainage with more suctioning required > yellowish, occ bloody says due to hot weather but really doesn't go out much and lives in a/c.  Not as comfortable on vent   No obvious patterns in  day to day or daytime variability or assoc   cp or chest tightness, subjective wheeze or overt sinus or hb symptoms. No unusual exp hx or h/o childhood pna/ asthma or knowledge of premature birth.  Sleeping ok without nocturnal  or early am exacerbation  of respiratory  c/o's or need for noct saba. Also denies any obvious fluctuation of symptoms with weather or environmental changes or other aggravating or alleviating factors except as outlined above   Current Medications, Allergies, Complete Past Medical History, Past Surgical History, Family History, and Social History were reviewed in [employer] record.  ROS  The following are not active complaints unless bolded sore throat, dysphagia, dental problems, itching, sneezing,  nasal congestion or excess/ purulent secretions, ear ache,   fever, chills, sweats, unintended wt loss, classically pleuritic or exertional cp, hemoptysis,  orthopnea pnd or leg swelling, presyncope, palpitations, abdominal pain, anorexia, nausea, vomiting, diarrhea  or change in bowel or bladder habits, change in stools or urine, dysuria,hematuria,  rash, arthralgias, visual complaints, headache, numbness, weakness or ataxia or problems with walking or coordination,  change in mood/affect or memory.         Review of Systems     Objective:   Physical Exam   Obese  W/c bound Wm nad   Wt Readings from Last 3 Encounters:  01/04/15 322 lb (146.058 kg)  12/08/14 310 lb (140.615 kg)  11/12/14 302 lb (136.986 kg)    Vital signs reviewed     HEENT: nl dentition, turbinates, and orophanx. Nl external ear canals without cough reflex   NECK :  without JVD/Nodes/TM/ nl carotid upstrokes bilaterally/ trach with pmv in place    LUNGS: no acc muscle use, clear to A and P bilaterally without cough on insp or exp maneuvers   CV:  RRR  no s3 or murmur or increase in P2, no edema   ABD:  soft and nontender with nl excursion in the supine position. No bruits or organomegaly, bowel sounds nl  MS:  warm without deformities, calf tenderness, cyanosis or clubbing  SKIN: warm and dry without lesions    NEURO:  alert, approp, no deficits    I personally reviewed images and agree with radiology impression as follows:  CXR:  01/04/15 Mild right lateral basilar opacity concerning for pneumonia or atelectasis with associated pleural effusion. Follow-up radiographs are recommended to ensure resolution.  Labs ordered/ reviewed:    Lab 01/04/15 1246  NA 140  K 3.9  CL 98  CO2 35*  BUN 11  CREATININE 0.70  GLUCOSE 251*      Lab 01/04/15 1246  HGB 12.4*  HCT 38.8*  WBC 12.2*  PLT 260.0     Lab Results  Component Value Date   TSH 0.98 01/04/2015     Lab Results  Component Value Date   PROBNP 116.0* 01/04/2015              Assessment:

## 2015-01-04 NOTE — Patient Instructions (Signed)
I will have advanced adjust your ventilator if needed   Ok to use the ventilator during the day   Please remember to go to the lab and x-ray department downstairs for your tests - we will call you with the results when they are available.  For drainage take chlortrimeton (chlorpheniramine) 4 mg every 4 hours available over the counter (may cause drowsiness)  And Pepcid 20 mg at bedime  If not better add prilosec 20 mg Take 30-60 min before first meal of the day   GERD (REFLUX)  is an extremely common cause of respiratory symptoms just like yours , many times with no obvious heartburn at all.    It can be treated with medication, but also with lifestyle changes including elevation of the head of your bed (ideally with 6 inch  bed blocks),  Smoking cessation, avoidance of late meals, excessive alcohol, and avoid fatty foods, chocolate, peppermint, colas, red wine, and acidic juices such as orange juice.  NO MINT OR MENTHOL PRODUCTS SO NO COUGH DROPS  USE SUGARLESS CANDY INSTEAD (Jolley ranchers or Stover's or Life Savers) or even ice chips will also do - the key is to swallow to prevent all throat clearing. NO OIL BASED VITAMINS - use powdered substitutes.     Please schedule a follow up visit in 3 months but call sooner if needed

## 2015-01-05 ENCOUNTER — Other Ambulatory Visit: Payer: Self-pay | Admitting: Internal Medicine

## 2015-01-05 LAB — PRESCRIPTION MONITORING PROFILE (SOLSTAS)
BARBITURATE SCREEN, URINE: NEGATIVE ng/mL
BENZODIAZEPINE SCREEN, URINE: NEGATIVE ng/mL
Buprenorphine, Urine: NEGATIVE ng/mL
COCAINE METABOLITES: NEGATIVE ng/mL
Cannabinoid Scrn, Ur: NEGATIVE ng/mL
Carisoprodol, Urine: NEGATIVE ng/mL
Creatinine, Urine: 63.7 mg/dL (ref >20.0)
ECSTASY: NEGATIVE ng/mL
Fentanyl, Ur: NEGATIVE ng/mL
Meperidine, Ur: NEGATIVE ng/mL
Methadone Screen, Urine: NEGATIVE ng/mL
NITRITES URINE, INITIAL: NEGATIVE ug/mL
Propoxyphene: NEGATIVE ng/mL
TAPENTADOLUR: NEGATIVE ng/mL
Tramadol Scrn, Ur: NEGATIVE ng/mL
Zolpidem, Urine: NEGATIVE ng/mL
pH, Initial: 5.3 pH (ref 4.5–8.9)

## 2015-01-05 MED ORDER — AMOXICILLIN-POT CLAVULANATE 875-125 MG PO TABS: 1.0000 | ORAL_TABLET | Freq: Two times a day (BID) | ORAL | Status: DC

## 2015-01-08 ENCOUNTER — Other Ambulatory Visit: Payer: Self-pay | Admitting: Endocrinology

## 2015-01-08 NOTE — Progress Notes (Signed)
Urine drug screen for this encounter is consistent for prescribed medication 

## 2015-01-10 ENCOUNTER — Encounter: Payer: Self-pay | Admitting: Internal Medicine

## 2015-01-10 DIAGNOSIS — R9389 Abnormal findings on diagnostic imaging of other specified body structures: Secondary | ICD-10-CM | POA: Insufficient documentation

## 2015-01-10 NOTE — Assessment & Plan Note (Signed)
DDX of  difficult airways management all start with A and  include Adherence, Ace Inhibitors, Acid Reflux, Active Sinus Disease, Alpha 1 Antitripsin deficiency, Anxiety masquerading as Airways dz,  ABPA,  allergy(esp in young), Aspiration (esp in elderly), Adverse effects of meds,  Active smokers, A bunch of PE's (a small clot burden can't cause this syndrome unless there is already severe underlying pulm or vascular dz with poor reserve) plus two Bs  = Bronchiectasis and Beta blocker use..and one C= CHF  Adherence is always the initial "prime suspect" and is a multilayered concern that requires a "trust but verify" approach in every patient - starting with knowing how to use medications, especially inhalers, correctly, keeping up with refills and understanding the fundamental difference between maintenance and prns vs those medications only taken for a very short course and then stopped and not refilled.   ? Acid (or non-acid) GERD > always difficult to exclude as up to 75% of pts in some series report no assoc GI/ Heartburn symptoms> rec max (24h)  acid suppression and diet restrictions/ reviewed and instructions given in writing.  A bunch of PE unlikley on coumadin whichmay need to be adjusted after a week of augmentn> advused   ? chf unlikley with such a low bnp  See instructions for specific recommendations which were reviewed directly with the patient who was given a copy with highlighter outlining the key components.

## 2015-01-10 NOTE — Assessment & Plan Note (Signed)
He has extremely small lung volumes at baseline likley related to obesity but with change in mucus/ wbc with L shift and ? Acute change on cxr rec Augmentin 875 mg take one pill twice daily  X 10 days - take at breakfast and supper with large glass of water.    F/u prn not back to baseline

## 2015-01-10 NOTE — Assessment & Plan Note (Addendum)
Prior Vent settings: S9 VPAP 14/10 [facility] 3 liters pulsed exertion and rest, 3 liters with vpap at bedtime. Per last entry 01/2014 but since then was admitted/ dc from [facility]  - Trach  per Suszanne Conners 07/14/14  - HC03 35  On  01/04/15 despite noct vent    Not clear from emr what settings he's using at present > will ask [facility] to clarify and in the meantime anytime he is sob during the day it's certainly acceptable to go back on vent   I had an extended discussion with the patient reviewing all relevant studies completed to date and  lasting 15 to 20 minutes of a 25 minute visit    Each maintenance medication was reviewed in detail including most importantly the difference between maintenance and prns and under what circumstances the prns are to be triggered using an action plan format that is not reflected in the computer generated alphabetically organized AVS.    Please see instructions for details which were reviewed in writing and the patient given a copy highlighting the part that I personally wrote and discussed at today's ov.

## 2015-01-10 NOTE — Assessment & Plan Note (Signed)
Body mass index is 50.42   Lab Results  Component Value Date   TSH 0.98 01/04/2015     Contributing to vent dep resp failure/ ohs/ reviewed need  achieve and maintain neg calorie balance > defer f/u primary care including intermittently monitoring thyroid status

## 2015-01-11 ENCOUNTER — Telehealth: Payer: Self-pay | Admitting: *Deleted

## 2015-01-11 NOTE — Telephone Encounter (Signed)
Spoke with the pt and notified of recs per MW  He will call the coumadin clinic and arrange f/u

## 2015-01-11 NOTE — Telephone Encounter (Signed)
-----   Message from Nyoka Cowden, MD sent at 01/10/2015  2:31 PM EDT ----- Needs coumadin rechecked on 01/11/15 if not already done since on augmentin

## 2015-01-21 ENCOUNTER — Other Ambulatory Visit: Payer: Self-pay | Admitting: Internal Medicine

## 2015-01-21 MED ORDER — PRAVASTATIN SODIUM 40 MG PO TABS: 40.0000 mg | ORAL_TABLET | Freq: Every day | ORAL | Status: DC

## 2015-01-26 ENCOUNTER — Ambulatory Visit (INDEPENDENT_AMBULATORY_CARE_PROVIDER_SITE_OTHER): Payer: Medicare Other | Admitting: General Practice

## 2015-01-26 DIAGNOSIS — Z5181 Encounter for therapeutic drug level monitoring: Secondary | ICD-10-CM | POA: Diagnosis not present

## 2015-01-26 DIAGNOSIS — I4891 Unspecified atrial fibrillation: Secondary | ICD-10-CM | POA: Diagnosis not present

## 2015-01-26 DIAGNOSIS — Z8679 Personal history of other diseases of the circulatory system: Secondary | ICD-10-CM

## 2015-01-26 LAB — POCT INR: INR: 2

## 2015-01-26 NOTE — Progress Notes (Signed)
Pre visit review using our clinic review tool, if applicable. No additional management support is needed unless otherwise documented below in the visit note. 

## 2015-01-26 NOTE — Progress Notes (Signed)
I have reviewed and agree with the plan. 

## 2015-02-04 ENCOUNTER — Encounter: Payer: Self-pay | Admitting: Endocrinology

## 2015-02-04 ENCOUNTER — Encounter: Payer: Self-pay | Admitting: Dietician

## 2015-02-04 ENCOUNTER — Encounter: Payer: Medicare Other | Attending: Endocrinology | Admitting: Dietician

## 2015-02-04 ENCOUNTER — Ambulatory Visit (INDEPENDENT_AMBULATORY_CARE_PROVIDER_SITE_OTHER): Payer: Medicare Other | Admitting: Endocrinology

## 2015-02-04 VITALS — BP 132/84 | HR 66 | Temp 98.3°F | Wt 308.0 lb

## 2015-02-04 DIAGNOSIS — Z6841 Body Mass Index (BMI) 40.0 and over, adult: Secondary | ICD-10-CM | POA: Insufficient documentation

## 2015-02-04 DIAGNOSIS — G5601 Carpal tunnel syndrome, right upper limb: Secondary | ICD-10-CM | POA: Diagnosis not present

## 2015-02-04 DIAGNOSIS — E118 Type 2 diabetes mellitus with unspecified complications: Secondary | ICD-10-CM | POA: Insufficient documentation

## 2015-02-04 DIAGNOSIS — M109 Gout, unspecified: Secondary | ICD-10-CM | POA: Diagnosis not present

## 2015-02-04 DIAGNOSIS — Z713 Dietary counseling and surveillance: Secondary | ICD-10-CM | POA: Diagnosis not present

## 2015-02-04 DIAGNOSIS — E1041 Type 1 diabetes mellitus with diabetic mononeuropathy: Secondary | ICD-10-CM | POA: Diagnosis not present

## 2015-02-04 DIAGNOSIS — G5602 Carpal tunnel syndrome, left upper limb: Secondary | ICD-10-CM | POA: Diagnosis not present

## 2015-02-04 DIAGNOSIS — Z794 Long term (current) use of insulin: Secondary | ICD-10-CM | POA: Insufficient documentation

## 2015-02-04 DIAGNOSIS — G5600 Carpal tunnel syndrome, unspecified upper limb: Secondary | ICD-10-CM | POA: Insufficient documentation

## 2015-02-04 DIAGNOSIS — E1049 Type 1 diabetes mellitus with other diabetic neurological complication: Secondary | ICD-10-CM

## 2015-02-04 DIAGNOSIS — E1065 Type 1 diabetes mellitus with hyperglycemia: Principal | ICD-10-CM

## 2015-02-04 DIAGNOSIS — IMO0002 Reserved for concepts with insufficient information to code with codable children: Secondary | ICD-10-CM

## 2015-02-04 DIAGNOSIS — G5603 Carpal tunnel syndrome, bilateral upper limbs: Secondary | ICD-10-CM

## 2015-02-04 LAB — POCT GLYCOSYLATED HEMOGLOBIN (HGB A1C): HEMOGLOBIN A1C: 6.3

## 2015-02-04 MED ORDER — ZOLPIDEM TARTRATE 10 MG PO TABS: 10.0000 mg | ORAL_TABLET | Freq: Every evening | ORAL | Status: DC | PRN

## 2015-02-04 MED ORDER — INSULIN ASPART 100 UNIT/ML FLEXPEN: 25.0000 [IU] | PEN_INJECTOR | Freq: Three times a day (TID) | SUBCUTANEOUS | Status: DC

## 2015-02-04 NOTE — Progress Notes (Signed)
Medical Nutrition Therapy:  Appt start time: 1200 end time:  1230.  Assessment: 12/08/14: Primary concerns today: Patient is here today with his wife.  Wife would like to know if they are doing the right thing with patient's nutrition to help him lose weight and control his blood sugar.  Hx includes type 2 diabetes for the past 20 years.  Hx also includes obstructive sleep He presents in a wheel chair.  He is oxygen dependent and has a trach.  He connects himself to a ventilator each time he lays down.  Recent hospitalization Jan 27-April 28 ([facility] and [facility]).   HgbA1C 7.7% 11/04/14.  Checks his blood sugar twice daily and wife reports improvement in numbers since he is back home.    Patient lives with his wife.  They watch grandchildren at times.  Wife does the shopping and cooking.  Eats out rarely.  Patient is concerned that a very low calorie diet would not be sustainable.  He complains of increased hunger and eats increased snacks.  When at home, he is in his room almost all of the time.  He eats all meals and snacks in front of the TV.  He is unwilling to change this.  Patient reports desire to lose weight and the benefits that it would have on improving his health.  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  Wife is extremely supportive.  02/04/15: Patient is here with his wife.  She has been reducing his portion sizes, using a smaller plate, and reducing the fat content of meals. He rarely snacks now.   He continues a no added salt diet when at home.  Patient states that he is hungry usually but understands need to change intake and lose weight.  He has been too nervous to use the pool with the trach.  He continues to use the weights and do armchair exercises at home.   He has lost 2 lbs since last visit and feels that his clothes fit differently.  They do not feel that the 7/25 weight was accurate.  His HgbA1C has reduced from 7.7% in May to 6.3% today.  He states that he is often hungry.   Discussed that food is often used for emotional reasons and worked to identify these.    Wt Readings from Last 3 Encounters:  02/04/15 308 lb (139.708 kg)  01/04/15 322 lb (146.058 kg)  12/08/14 310 lb (140.615 kg)    Preferred Learning Style:   No preference indicated   Learning Readiness:   Contemplating  MEDICATIONS: see list to include 30 units Novolog with each meal and 45 units lantus every HS.   DIETARY INTAKE: Patient states that he does not add any added salt to foods.  Wife grows her own herbs and vegetables. 24-hr recall:  B (8-9 AM): yogurt or cottage cheese with fruit and English muffin OR 1 English muffin with applesauce, yogurt, cereal and lite yogurt  Snk ( AM): none  L (1-2 PM):  1/2 sub and 1/2 chef salad with ranch Snk ( PM): none or protein bar or popcorn D ( PM): chicken or salmon or ham or hamburger starch or bread,  Snk ( PM):protein bar at times (wife limits these to 1 per day) Beverages: 1% milk, water, sugar free lemonade, coffee with half and half, hot tea  Usual physical activity: ADL's.  , dumbbells, resistance bands and other armchair exercises, Just built a pool for patient to do water exercises.  Will  use a hoyer lift to get in.    Estimated energy needs: 1800 calories 200 g carbohydrates 135 g protein 50 g fat  Progress Towards Goal(s):  In progress.   Nutritional Diagnosis:  NB-1.1 Food and nutrition-related knowledge deficit As related to balance of carbohydrate, protein and fat.  As evidenced by diet hx.    Intervention:  Nutrition counseling and diabetes education continued with guidelines appropriate for weight loss.  Wife is very supportive and has helped patient make many changes.  Discussed hunger/satiety scale.  . Continue to avoid added salt at the table and in cooking. Be mindful of your appetite and when you are full/satisfied.  Avoid being overstuffed.  Stop when you are satisfied. Great job on the changes that you have  made! Consider a food journal.  Aim for 4 Carb Choices per meal (60 grams) +/- 1 either way  Aim for 0-2 Carbs per snack if hungry.  Raw veges make a good snack Include protein in moderation with your meals and snacks Consider reading food labels for Total Carbohydrate and Fat Grams of foods   Teaching Method Utilized:  Visual Auditory  Handouts given during visit include:  Hunger/Satiety scale  Barriers to learning/adherence to lifestyle change: overall health  Demonstrated degree of understanding via:  Teach Back   Monitoring/Evaluation:  Dietary intake, exercise, label reading, and body weight in 2 month(s).

## 2015-02-04 NOTE — Patient Instructions (Addendum)
check your blood sugar twice a day.  vary the time of day when you check, between before the 3 meals, and at bedtime.  also check if you have symptoms of your blood sugar being too high or too low.  please keep a record of the readings and bring it to your next appointment here.  You can write it on any piece of paper.  please call us sooner if your blood sugar goes below 70, or if you have a lot of readings over 200.       Please come back for a follow-up appointment in 3 months.   Please reduce the novolog to 25 units 3 times a day (just before each meal).

## 2015-02-04 NOTE — Patient Instructions (Signed)
Continue to avoid added salt at the table and in cooking. Be mindful of your appetite and when you are full/satisfied.  Avoid being overstuffed.  Stop when you are satisfied. Great job on the changes that you have made! Consider a food journal.  Aim for 4 Carb Choices per meal (60 grams) +/- 1 either way  Aim for 0-2 Carbs per snack if hungry.  Raw veges make a good snack Include protein in moderation with your meals and snacks Consider reading food labels for Total Carbohydrate and Fat Grams of foods

## 2015-02-04 NOTE — Progress Notes (Signed)
Subjective:    Patient ID: Blake Summers, male    DOB: 1945-08-04, 69 y.o.   MRN: [id number]  HPI Pt returns for f/u of diabetes mellitus: DM type: Insulin-requiring type 2 Dx'ed: 1990 Complications: polyneuropathy, CAD, CVA, and foot ulcer Therapy: insulin since 1999 DKA: never Severe hypoglycemia: never Pancreatitis: never Other: he takes multiple daily injections; he was turned down for weight-loss surgery, due to comorbidities Interval history: He seldom has hypoglycemia, and these are mild.  no cbg record, but states cbg's are lowest after the evening meal, but also higher at hs than in am.  I asked wife, who says if a meal is delayed, glucose starts to increase.   Past Medical History  Diagnosis Date  . GERD 07/06/2010  . Edema 10/28/2007  . DIABETES MELLITUS, TYPE II 05/04/2007  . HYPERLIPIDEMIA 07/08/2008  . ALLERGIC RHINITIS 07/08/2008  . GLAUCOMA 07/08/2008  . CEREBROVASCULAR ACCIDENT, HX OF 07/08/2008  . OBSTRUCTIVE SLEEP APNEA 03/06/2008  . DIABETIC ULCER, LEFT LEG 08/23/2009  . 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 HYPERTENSION 10/28/2007  . VENOUS INSUFFICIENCY 03/08/2009  . FATTY LIVER DISEASE 05/19/2008  . BENIGN PROSTATIC HYPERTROPHY 07/08/2008  . 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 NEPHROLITHIASIS, HX OF 07/08/2008  . Morbid obesity   . DM nephropathy/sclerosis   . 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 Lumbar spondylosis   . Diarrhea 06/08/2013    Past Surgical History  Procedure Laterality Date  . Lithotripsy  2007  . Tracheostomy tube placement N/A 07/14/2014    Procedure: TRACHEOSTOMY;  Surgeon: Darletta Moll, MD;  Location: [facility] OR;  Service: ENT;  Laterality: N/A;    Social History   Social History  . Marital Status: Married    Spouse Name: N/A  . Number of Children: N/A  . Years of Education: N/A   Occupational History  . Retired     Nature conservation officer   Social History Main Topics  . Smoking status: Former Smoker -- 0.50  packs/day for 15 years    Types: Cigarettes    Quit date: 06/12/1988  . Smokeless tobacco: Never Used  . Alcohol Use: No  . Drug Use: No  . Sexual Activity: Not on file   Other Topics Concern  . Not on file   Social History Narrative   Diet is "good"   Exercise is limited by medical problems    Current Outpatient Prescriptions on File Prior to Visit  Medication Sig Dispense Refill  . aspirin 81 MG chewable tablet Chew 1 tablet (81 mg total) by mouth daily.    . brimonidine (ALPHAGAN) 0.15 % ophthalmic solution Place 1 drop into both eyes 2 (two) times daily.     Marland Kitchen dexlansoprazole (DEXILANT) 60 MG capsule Take 1 capsule (60 mg total) by mouth daily. 30 capsule 6  . diclofenac (VOLTAREN) 0.1 % ophthalmic solution 4 (four) times daily.    Marland Kitchen diltiazem (CARDIZEM) 60 MG tablet Take 1 tablet (60 mg total) by mouth every 8 (eight) hours. 90 tablet 11  . dorzolamide-timolol (COSOPT) 22.3-6.8 MG/ML ophthalmic solution Place 1 drop into both eyes 2 (two) times daily.     . furosemide (LASIX) 80 MG tablet     . Insulin Glargine (LANTUS SOLOSTAR) 100 UNIT/ML Solostar Pen Inject 45 Units into the skin daily at 10 pm. 5 pen 2  . ketoconazole (NIZORAL) 2 % shampoo Apply 1 application topically once a week.     . latanoprost (XALATAN) 0.005 % ophthalmic  solution Place 1 drop into both eyes at bedtime. 2.5 mL 4  . lidocaine (LIDODERM) 5 % Place 1-3 patches onto the skin daily. Remove & Discard patch within 12 hours or as directed by MD 90 patch 2  . loratadine (CLARITIN) 10 MG tablet Take 10 mg by mouth daily.    . Melatonin 3 MG CAPS Take by mouth.    . morphine (MS CONTIN) 15 MG 12 hr tablet Take 1 tablet (15 mg total) by mouth every 12 (twelve) hours. 60 tablet 0  . ONETOUCH VERIO test strip CHECK BLOOD SUGAR TWICE A DAY 100 each 5  . oxyCODONE-acetaminophen (PERCOCET) 7.5-325 MG per tablet One tablet every 4 hours as needed. No More than 3 aday 90 tablet 0  . polyethylene glycol (MIRALAX /  GLYCOLAX) packet Take 17 g by mouth daily.    . polyethylene glycol powder (GLYCOLAX/MIRALAX) powder     . polyethylene glycol powder (GLYCOLAX/MIRALAX) powder MIX 1 CAPFUL (17 GRAMS) IN 8 TO 10 OUNCES OF LIQUID AND DRINK ONCE DAILY AS DIRECTED 527 g 2  . pravastatin (PRAVACHOL) 40 MG tablet Take 1 tablet (40 mg total) by mouth daily at 6 PM. 90 tablet 3  . senna (SENOKOT) 8.6 MG TABS tablet Take 1 tablet (8.6 mg total) by mouth daily as needed for mild constipation. 50 each 5  . tamsulosin (FLOMAX) 0.4 MG CAPS capsule Take 1 capsule (0.4 mg total) by mouth daily. 30 capsule 6  . torsemide (DEMADEX) 20 MG tablet Take 1 tablet (20 mg total) by mouth daily. 30 tablet 11  . warfarin (COUMADIN) 6 MG tablet Take 2 tablets (12 mg total) by mouth one time only at 6 PM. Take as directed by anticoagulation clinic 60 tablet 3  . amoxicillin-clavulanate (AUGMENTIN) 875-125 MG per tablet Take 1 tablet by mouth 2 (two) times daily. (Patient not taking: Reported on 02/04/2015) 20 tablet 0   No current facility-administered medications on file prior to visit.    Allergies  Allergen Reactions  . Neosporin [Neomycin-Polymyxin-Gramicidin] Other (See Comments)    unknown    Family History  Problem Relation Age of Onset  . Cancer Brother     Colon Cancer  . Heart disease Brother   . Heart disease Brother   . Asthma Mother     BP 132/84 mmHg  Pulse 66  Temp(Src) 98.3 F (36.8 C) (Oral)  Wt 308 lb (139.708 kg)  SpO2 90%  Review of Systems Denies LOC    Objective:   Physical Exam VITAL SIGNS:  See vs page GENERAL: no distress Pulses: dorsalis pedis intact bilat.   MSK: no deformity of the feet CV: trace bilat leg edema Skin:  no ulcer on the feet.  normal temp on the feet. spotty hyperpigmentation on the feet. Neuro: sensation is intact to touch on the feet, but decreased from normal Ext: legs are bandaged.    A1c=6.3%    Assessment & Plan:  DM: slightly overcontrolled.   Patient is  advised the following: Patient Instructions  check your blood sugar twice a day.  vary the time of day when you check, between before the 3 meals, and at bedtime.  also check if you have symptoms of your blood sugar being too high or too low.  please keep a record of the readings and bring it to your next appointment here.  You can write it on any piece of paper.  please call us sooner if your blood sugar goes below 70, or if  you have a lot of readings over 200.       Please come back for a follow-up appointment in 3 months.   Please reduce the novolog to 25 units 3 times a day (just before each meal).

## 2015-02-16 ENCOUNTER — Encounter: Payer: Medicare Other | Attending: Registered Nurse | Admitting: Physical Medicine & Rehabilitation

## 2015-02-16 ENCOUNTER — Encounter: Payer: Self-pay | Admitting: Physical Medicine & Rehabilitation

## 2015-02-16 DIAGNOSIS — M25561 Pain in right knee: Secondary | ICD-10-CM | POA: Insufficient documentation

## 2015-02-16 DIAGNOSIS — M179 Osteoarthritis of knee, unspecified: Secondary | ICD-10-CM | POA: Insufficient documentation

## 2015-02-16 DIAGNOSIS — M1711 Unilateral primary osteoarthritis, right knee: Secondary | ICD-10-CM | POA: Diagnosis not present

## 2015-02-16 DIAGNOSIS — J9612 Chronic respiratory failure with hypercapnia: Secondary | ICD-10-CM

## 2015-02-16 DIAGNOSIS — M1712 Unilateral primary osteoarthritis, left knee: Secondary | ICD-10-CM | POA: Diagnosis not present

## 2015-02-16 DIAGNOSIS — Z930 Tracheostomy status: Secondary | ICD-10-CM | POA: Insufficient documentation

## 2015-02-16 DIAGNOSIS — J9620 Acute and chronic respiratory failure, unspecified whether with hypoxia or hypercapnia: Secondary | ICD-10-CM | POA: Diagnosis not present

## 2015-02-16 DIAGNOSIS — K5909 Other constipation: Secondary | ICD-10-CM | POA: Insufficient documentation

## 2015-02-16 DIAGNOSIS — T40605A Adverse effect of unspecified narcotics, initial encounter: Secondary | ICD-10-CM | POA: Diagnosis not present

## 2015-02-16 DIAGNOSIS — Z760 Encounter for issue of repeat prescription: Secondary | ICD-10-CM | POA: Insufficient documentation

## 2015-02-16 DIAGNOSIS — G8929 Other chronic pain: Secondary | ICD-10-CM | POA: Insufficient documentation

## 2015-02-16 DIAGNOSIS — M545 Low back pain: Secondary | ICD-10-CM | POA: Diagnosis not present

## 2015-02-16 MED ORDER — OXYCODONE-ACETAMINOPHEN 7.5-325 MG PO TABS: ORAL_TABLET | ORAL | Status: DC

## 2015-02-16 MED ORDER — TOPIRAMATE 50 MG PO TABS: 50.0000 mg | ORAL_TABLET | Freq: Every day | ORAL | Status: DC

## 2015-02-16 MED ORDER — MORPHINE SULFATE ER 15 MG PO TBCR: 15.0000 mg | EXTENDED_RELEASE_TABLET | Freq: Two times a day (BID) | ORAL | Status: DC

## 2015-02-16 NOTE — Patient Instructions (Signed)
WEAR YOUR CARPAL TUNNEL SPLINTS AS OFTEN AS YOU CAN.   CONTINUE TO WORK ON YOUR DIET AND WEIGHT LOSS!!!

## 2015-02-16 NOTE — Progress Notes (Signed)
Subjective:    Patient ID: Blake Summers, male    DOB: July 10, 1945, 69 y.o.   MRN: [id number]  HPI   Blake Summers is here in follow up of his chronic pain. He has had continued issues from a respiratory standpoint. He has a home ventilator which he goes on each night.  He has a chronic trach with a cuff which he has inflated at night while he's on the vent. He uses his PMV during the day. He is followed by pulmonary and ENT.  He continues to work on weight loss. He has lost some weight. His wife is realing pushing vegetables and healthier eating.    Pain Inventory Average Pain 7 Pain Right Now 7 My pain is sharp, stabbing and tingling  In the last 24 hours, has pain interfered with the following? General activity 8 Relation with others 8 Enjoyment of life 8 What TIME of day is your pain at its worst? daytime Sleep (in general) Poor  Pain is worse with: walking and standing Pain improves with: rest, heat/ice, medication and injections Relief from Meds: 6  Mobility use a cane use a walker use a wheelchair needs help with transfers  Function retired I need assistance with the following:  dressing, bathing, toileting, meal prep, household duties and shopping  Neuro/Psych weakness numbness tingling trouble walking dizziness  Prior Studies Any changes since last visit?  no  Physicians involved in your care Any changes since last visit?  no   Family History  Problem Relation Age of Onset  . Cancer Brother     Colon Cancer  . Heart disease Brother   . Heart disease Brother   . Asthma Mother    Social History   Social History  . Marital Status: Married    Spouse Name: N/A  . Number of Children: N/A  . Years of Education: N/A   Occupational History  . Retired     Nature conservation officer   Social History Main Topics  . Smoking status: Former Smoker -- 0.50 packs/day for 15 years    Types: Cigarettes    Quit date: 06/12/1988  . Smokeless tobacco: Never Used    . Alcohol Use: No  . Drug Use: No  . Sexual Activity: Not Asked   Other Topics Concern  . None   Social History Narrative   Diet is "good"   Exercise is limited by medical problems   Past Surgical History  Procedure Laterality Date  . Lithotripsy  2007  . Tracheostomy tube placement N/A 07/14/2014    Procedure: TRACHEOSTOMY;  Surgeon: Darletta Moll, MD;  Location: [facility] OR;  Service: ENT;  Laterality: N/A;   Past Medical History  Diagnosis Date  . GERD 07/06/2010  . Edema 10/28/2007  . DIABETES MELLITUS, TYPE II 05/04/2007  . HYPERLIPIDEMIA 07/08/2008  . ALLERGIC RHINITIS 07/08/2008  . GLAUCOMA 07/08/2008  . CEREBROVASCULAR ACCIDENT, HX OF 07/08/2008  . OBSTRUCTIVE SLEEP APNEA 03/06/2008  . DIABETIC ULCER, LEFT LEG 08/23/2009  . 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 HYPERTENSION 10/28/2007  . VENOUS INSUFFICIENCY 03/08/2009  . FATTY LIVER DISEASE 05/19/2008  . BENIGN PROSTATIC HYPERTROPHY 07/08/2008  . 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 NEPHROLITHIASIS, HX OF 07/08/2008  . Morbid obesity   . DM nephropathy/sclerosis   . 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 Lumbar spondylosis   . Diarrhea 06/08/2013   BP 131/48 mmHg  Pulse 60  SpO2 82%  Opioid Risk Score:   Fall  Risk Score:  `1  Depression screen PHQ 2/9  Depression screen [facility] 2/9 02/16/2015 02/04/2015 12/29/2014 12/08/2014 10/20/2014  Decreased Interest 0 0 0 0 0  Down, Depressed, Hopeless 0 0 0 0 0  PHQ - 2 Score 0 0 0 0 0  Altered sleeping - - - - 0  Tired, decreased energy - - - - 2  Change in appetite - - - - 0  Feeling bad or failure about yourself  - - - - 0  Trouble concentrating - - - - 0  Moving slowly or fidgety/restless - - - - 0  Suicidal thoughts - - - - 0  PHQ-9 Score - - - - 2     Review of Systems  Respiratory:       Trach  Musculoskeletal: Positive for gait problem.  Neurological: Positive for dizziness, weakness and numbness.       Tingling  All other systems reviewed and are  negative.      Objective:   Physical Exam  Constitutional: He is oriented to person, place, and time. He appears well-developed and well-nourished.  Looks bright and in no distress  HENT:  Head: Normocephalic and atraumatic.  Eyes: Conjunctivae are normal. Pupils are equal, round, and reactive to light.  Neck: #6 XL shiley trach with PMV and oxygen.  Cardiovascular: Normal rate and regular rhythm.  Pulmonary/Chest: Effort normal and breath sounds normal. No respiratory distress. He has no wheezes.  Abdominal: Soft. Bowel sounds are normal. He exhibits no distension. There is no tenderness.  Musculoskeletal:  . Muscle testing reveals 5/5 muscle strength of the upper extremity, and 4/5 of the lower extremity. Exam limited by powered chair/habitus Neurological: He is alert and oriented to person, place, and time. Tinel's + left wrist. He is not earing splints. Skin: Skin is warm and dry.  Psychiatric: He has a normal mood and affect. His behavior is normal. Thought content normal.   Assessment & Plan:   1. End-stage right knee osteoarthritis: Continue to use lidocaine patches.  Refilled: MS contin 15 mg #60  bid.  Percocet 7.5/325 mg # 90 pills--use every 6 hours as needed for pain.  Second rx'es were provided for October 2. Morbid obesity with generalized deconditioning---w/c bound essentially   -continue to address diet/healthier living 3. Chronic low back pain/spondylosis:  -consider further imaging if we want to do anything more interventional down the road   5. Neuropathy and CTS---continued DAILY splinting, diabetes controlled---probably not a great surgical   -resume hs topamax, 50mg  qhs (has rx still at home) 6. Narcotic induced constipation: miralax/senna and diet modifications  -consider movantik trial  15 minutes of face to face patient care time were spent during this visit. All questions were encouraged and answered. Follow up in 2 months with NP .

## 2015-02-24 NOTE — Progress Notes (Signed)
Cardiology Office Note   Date:  02/28/2015   ID:  Blake Summers, DOB [birth year], MRN [id number]  PCP:  [name], MD  Cardiologist:   Dietrich Pates, MD   F/U of Hypertension and atrial fib      History of Present Illness: Blake Summers is a 69 y.o. male with a history of DM, stroke, HTN, morbid obesity, hypoventilation syndrom. Has tracheostomy He also has a history of atrial fibrillation.  (rate control and coumadin)  I saw the pt in June 2016  Echo ordered  LVEF was normal    SInce seen his breathing is rel unchanged  Not very interested in doing breathing exercises.      Current Outpatient Prescriptions  Medication Sig Dispense Refill  . aspirin 81 MG chewable tablet Chew 1 tablet (81 mg total) by mouth daily.    . brimonidine (ALPHAGAN) 0.15 % ophthalmic solution Place 1 drop into both eyes 2 (two) times daily.     Marland Kitchen dexlansoprazole (DEXILANT) 60 MG capsule Take 1 capsule (60 mg total) by mouth daily. 30 capsule 6  . diltiazem (CARDIZEM) 60 MG tablet Take 1 tablet (60 mg total) by mouth every 8 (eight) hours. 90 tablet 11  . dorzolamide-timolol (COSOPT) 22.3-6.8 MG/ML ophthalmic solution Place 1 drop into both eyes 2 (two) times daily.     . furosemide (LASIX) 80 MG tablet Take 80 mg by mouth daily.     . insulin aspart (NOVOLOG FLEXPEN) 100 UNIT/ML FlexPen Inject 25 Units into the skin 3 (three) times daily with meals. 30 mL 11  . Insulin Glargine (LANTUS SOLOSTAR) 100 UNIT/ML Solostar Pen Inject 45 Units into the skin daily at 10 pm. 5 pen 2  . ketoconazole (NIZORAL) 2 % shampoo Apply 1 application topically once a week.     . latanoprost (XALATAN) 0.005 % ophthalmic solution Place 1 drop into both eyes at bedtime. 2.5 mL 4  . lidocaine (LIDODERM) 5 % Place 1-3 patches onto the skin daily. Remove & Discard patch within 12 hours or as directed by MD 90 patch 2  . Melatonin 3 MG CAPS Take 1 capsule by mouth daily.     Marland Kitchen morphine (MS CONTIN) 15 MG 12 hr tablet Take 1  tablet (15 mg total) by mouth every 12 (twelve) hours. 60 tablet 0  . ONETOUCH VERIO test strip CHECK BLOOD SUGAR TWICE A DAY 100 each 5  . oxyCODONE-acetaminophen (PERCOCET) 7.5-325 MG per tablet Take 1 tablet by mouth every 8 (eight) hours as needed (pain).    . polyethylene glycol powder (GLYCOLAX/MIRALAX) powder MIX 1 CAPFUL (17 GRAMS) IN 8 TO 10 OUNCES OF LIQUID AND DRINK ONCE DAILY AS DIRECTED 527 g 2  . pravastatin (PRAVACHOL) 40 MG tablet Take 1 tablet (40 mg total) by mouth daily at 6 PM. 90 tablet 3  . senna (SENOKOT) 8.6 MG TABS tablet Take 1 tablet (8.6 mg total) by mouth daily as needed for mild constipation. 50 each 5  . tamsulosin (FLOMAX) 0.4 MG CAPS capsule Take 1 capsule (0.4 mg total) by mouth daily. 30 capsule 6  . topiramate (TOPAMAX) 50 MG tablet Take 1 tablet (50 mg total) by mouth at bedtime. 30 tablet 2  . torsemide (DEMADEX) 20 MG tablet Take 1 tablet (20 mg total) by mouth daily. 30 tablet 11  . warfarin (COUMADIN) 6 MG tablet Take 2 tablets (12 mg total) by mouth one time only at 6 PM. Take as directed by anticoagulation clinic 60 tablet 3  .  zolpidem (AMBIEN) 10 MG tablet Take 1 tablet (10 mg total) by mouth at bedtime as needed for sleep. 30 tablet 5   No current facility-administered medications for this visit.    Allergies:   Neosporin   Past Medical History  Diagnosis Date  . GERD 07/06/2010  . Edema 10/28/2007  . DIABETES MELLITUS, TYPE II 05/04/2007  . HYPERLIPIDEMIA 07/08/2008  . ALLERGIC RHINITIS 07/08/2008  . GLAUCOMA 07/08/2008  . CEREBROVASCULAR ACCIDENT, HX OF 07/08/2008  . OBSTRUCTIVE SLEEP APNEA 03/06/2008  . DIABETIC ULCER, LEFT LEG 08/23/2009  . 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 HYPERTENSION 10/28/2007  . VENOUS INSUFFICIENCY 03/08/2009  . FATTY LIVER DISEASE 05/19/2008  . BENIGN PROSTATIC HYPERTROPHY 07/08/2008  . 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 NEPHROLITHIASIS, HX OF 07/08/2008  . Morbid obesity   . DM nephropathy/sclerosis   . DEGENERATIVE  JOINT DISEASE 05/24/2009    R knee, end stage  . Lumbar spondylosis   . Diarrhea 06/08/2013    Past Surgical History  Procedure Laterality Date  . Lithotripsy  2007  . Tracheostomy tube placement N/A 07/14/2014    Procedure: TRACHEOSTOMY;  Surgeon: Darletta Moll, MD;  Location: [facility] OR;  Service: ENT;  Laterality: N/A;     Social History:  The patient  reports that he quit smoking about 26 years ago. His smoking use included Cigarettes. He has a 7.5 pack-year smoking history. He has never used smokeless tobacco. He reports that he does not drink alcohol or use illicit drugs.   Family History:  The patient's family history includes Asthma in his mother; Cancer in his brother; Heart disease in his brother and brother.    ROS:  Please see the history of present illness. All other systems are reviewed and  Negative to the above problem except as noted.    PHYSICAL EXAM: VS:  BP 138/58 mmHg  Pulse 105  Ht  (1.702 m)  Wt 296 lb 1.9 oz (134.319 kg)  BMI 46.37 kg/m2  SpO2 73%   With O2 (3 L ) sat increased to 9 GEN: Morbidly obese 69 yo examined in wheelchair HEENT: normal Neck: Trach Cardiac: RRR; no murmurs, rubs, or gallops, 1+ edema  Respiratory:  clear to auscultation bilaterally, normal work of breathing GI: soft, nontender, nondistended, + BS  No hepatomegaly  MS: no deformity Moving all extremities   Skin: warm and dry, no rash Neuro:  Strength and sensation are intact Psych: euthymic mood, full affect   EKG:  EKG is not ordered today.   Lipid Panel    Component Value Date/Time   CHOL 119 11/04/2014 1549   TRIG 255.0* 11/04/2014 1549   HDL 27.30* 11/04/2014 1549   CHOLHDL 4 11/04/2014 1549   VLDL 51.0* 11/04/2014 1549   LDLCALC 65 02/25/2014 1211   LDLDIRECT 55.0 11/04/2014 1549      Wt Readings from Last 3 Encounters:  02/26/15 296 lb 1.9 oz (134.319 kg)  02/04/15 308 lb (139.708 kg)  01/04/15 322 lb (146.058 kg)      ASSESSMENT AND PLAN:   1  Atral  fib  Continue rate control  I do not think he would maintain SR with large atria  Continue coumadin  2.  Hypoventilation syndrome  Stressed importance of breathing exercises  3.  Morbid obesity  Low activity level limits wt loss    I will set to see in spring   Signed, Dietrich Pates, MD  02/28/2015 9:07 PM    [facility] [address] [place], [place]  [address] Phone: [phone]; Fax: [phone]

## 2015-02-26 ENCOUNTER — Ambulatory Visit (INDEPENDENT_AMBULATORY_CARE_PROVIDER_SITE_OTHER): Payer: Medicare Other | Admitting: *Deleted

## 2015-02-26 ENCOUNTER — Ambulatory Visit (INDEPENDENT_AMBULATORY_CARE_PROVIDER_SITE_OTHER): Payer: Medicare Other | Admitting: Internal Medicine

## 2015-02-26 ENCOUNTER — Encounter: Payer: Self-pay | Admitting: Internal Medicine

## 2015-02-26 VITALS — BP 138/58 | HR 105 | Ht 67.0 in | Wt 296.1 lb

## 2015-02-26 DIAGNOSIS — E662 Morbid (severe) obesity with alveolar hypoventilation: Secondary | ICD-10-CM | POA: Diagnosis not present

## 2015-02-26 DIAGNOSIS — I481 Persistent atrial fibrillation: Secondary | ICD-10-CM

## 2015-02-26 DIAGNOSIS — I4891 Unspecified atrial fibrillation: Secondary | ICD-10-CM | POA: Diagnosis not present

## 2015-02-26 DIAGNOSIS — Z8679 Personal history of other diseases of the circulatory system: Secondary | ICD-10-CM | POA: Diagnosis not present

## 2015-02-26 DIAGNOSIS — I4819 Other persistent atrial fibrillation: Secondary | ICD-10-CM

## 2015-02-26 DIAGNOSIS — Z5181 Encounter for therapeutic drug level monitoring: Secondary | ICD-10-CM | POA: Diagnosis not present

## 2015-02-26 LAB — POCT INR: INR: 2.8

## 2015-02-26 NOTE — Patient Instructions (Signed)
Your physician recommends that you continue on your current medications as directed. Please refer to the Current Medication list given to you today. Your physician wants you to follow-up in: 6 months with Dr. Ross.  You will receive a reminder letter in the mail two months in advance. If you don't receive a letter, please call our office to schedule the follow-up appointment.  

## 2015-03-05 ENCOUNTER — Ambulatory Visit (INDEPENDENT_AMBULATORY_CARE_PROVIDER_SITE_OTHER): Payer: Medicare Other | Admitting: Internal Medicine

## 2015-03-05 ENCOUNTER — Encounter: Payer: Self-pay | Admitting: Internal Medicine

## 2015-03-05 VITALS — BP 104/66 | HR 56 | Ht 67.0 in | Wt 304.0 lb

## 2015-03-05 DIAGNOSIS — J9612 Chronic respiratory failure with hypercapnia: Secondary | ICD-10-CM | POA: Diagnosis not present

## 2015-03-05 NOTE — Progress Notes (Signed)
Subjective:     Patient ID: Blake Summers, male   DOB: 1945-09-26, .   MRN: [id number]     Brief patient profile:  69 yowm OHS  last doing well in May 2016 sleeping 45 degreees HOB on home vent and 02 and during  Day just 02 3lpm and pmv   History of Present Illness  01/04/2015 1st [place]   office visit/ Wert   Chief Complaint  Patient presents with  . Follow-up    Former pt of Dr Shelle Iron. Pt c/o increased SOB with exertion- relates to hot weather.   cc worse sob since May 2016  lots of nasal drainage with more suctioning required > yellowish,  occ bloody says due to hot weather but really doesn't go out much and lives in a/c.  Not as comfortable on vent  rec I will have advanced adjust your ventilator if needed  Ok to use the ventilator during the day  For drainage take chlortrimeton (chlorpheniramine) 4 mg every 4 hours available over the counter (may cause drowsiness)  And Pepcid 20 mg at bedime If not better add prilosec 20 mg Take 30-60 min before first meal of the day  GERD diet  Please schedule a follow up visit in 3 months but call sooner if needed  Late add :  ? pna > rx Augmentin 875 mg take one pill twice daily  X 10 days   Admit [facility] dx hypercarbia/ ? narc overuse > placed back on vent and did fine per discharge summary   03/05/2015  f/u ov/Wert re: ohs/vent dep / now on ACEi  Chief Complaint  Patient presents with  . HFU    Pt states admitted to [facility] from 9/19-9/19 with respiratory failure and elevated co2. His breathing has improved since he was discharged home.     Wife thinks the reason he's better is that 02 is now per trach but note at the time he worsened was placed on home vent and still had severe hypercarbia on arrival to [facility].  He has been no obvious change in his ventilator in terms of settings since he was complaining the ventilator was not given him enough breath at his last ov    No obvious patterns in  day to day or daytime variability or assoc    cp or chest tightness, subjective wheeze or overt sinus or hb symptoms. No unusual exp hx or h/o childhood pna/ asthma or knowledge of premature birth.  Sleeping ok without nocturnal  or early am exacerbation  of respiratory  c/o's or need for noct saba. Also denies any obvious fluctuation of symptoms with weather or environmental changes or other aggravating or alleviating factors except as outlined above   Current Medications, Allergies, Complete Past Medical History, Past Surgical History, Family History, and Social History were reviewed in [employer] record.  ROS  The following are not active complaints unless bolded sore throat, dysphagia, dental problems, itching, sneezing,  nasal congestion or excess/ purulent secretions, ear ache,   fever, chills, sweats, unintended wt loss, classically pleuritic or exertional cp, hemoptysis,  orthopnea pnd or leg swelling, presyncope, palpitations, abdominal pain, anorexia, nausea, vomiting, diarrhea  or change in bowel or bladder habits, change in stools or urine, dysuria,hematuria,  rash, arthralgias, visual complaints, headache, numbness, weakness or ataxia or problems with walking or coordination,  change in mood/affect or memory.            Objective:   Physical Exam  Obese  W/c bound Wm nad  03/05/2015        304 Wt Readings from Last 3 Encounters:  01/04/15 322 lb (146.058 kg)  12/08/14 310 lb (140.615 kg)  11/12/14 302 lb (136.986 kg)    Vital signs reviewed     HEENT: nl dentition, turbinates, and orophanx. Nl external ear canals without cough reflex   NECK :  without JVD/Nodes/TM/ nl carotid upstrokes bilaterally/ trach with pmv in place    LUNGS: no acc muscle use, clear to A and P bilaterally without cough on insp or exp maneuvers   CV:  RRR  no s3 or murmur or increase in P2, no edema   ABD:  soft and nontender with nl excursion in the supine position. No bruits or organomegaly, bowel sounds  nl  MS:  warm without deformities, calf tenderness, cyanosis or clubbing  SKIN: warm and dry without lesions    NEURO:  alert, approp, no deficits    I personally reviewed images and agree with radiology impression as follows:  CXR:  01/04/15 Mild right lateral basilar opacity concerning for pneumonia or atelectasis with associated pleural effusion. Follow-up radiographs are recommended to ensure resolution.  Labs ordered/ reviewed:    Lab 01/04/15 1246  NA 140  K 3.9  CL 98  CO2 35*  BUN 11  CREATININE 0.70  GLUCOSE 251*      Lab 01/04/15 1246  HGB 12.4*  HCT 38.8*  WBC 12.2*  PLT 260.0     Lab Results  Component Value Date   TSH 0.98 01/04/2015     Lab Results  Component Value Date   PROBNP 116.0* 01/04/2015              Assessment:

## 2015-03-05 NOTE — Patient Instructions (Addendum)
No change in recs for now  - beware of choking sensation from lisinopril - if this develops it needs to be stopped and a substitute found per Dr Pecola Lawless discretion  I recommend you establish with [facility] and see [place] as needed (needs to be seen every 3 months)

## 2015-03-07 ENCOUNTER — Encounter: Payer: Self-pay | Admitting: Internal Medicine

## 2015-03-07 NOTE — Assessment & Plan Note (Addendum)
Prior Vent settings: S9 VPAP 14/10 [facility] Admit 06/2014 with worsening RF, required trach and nocturnal vent, required [facility] until 10/08/14. 3 liters pulsed exertion and rest, 3 liters with vpap at bedtime. Per last entry 01/2014 but since then was admitted/ dc from [facility]  - Trach  per Suszanne Conners 07/14/14  - HC03 35  On  01/04/15 despite noct vent  On nocturnal vent as of 01/05/15 : [id number] Settings SIMV/VC, Rate=6, PEEP=+5, PSV=12, VT=500, with 3LPM O2    Will request confirmation of  present vent setting from [facility].  I stress to his wife that regardless of the problem, he is better off on the ventilator if he is having any trouble at all with mentation or shortness of breath and it is not clear to me in retrospect why he was so hypercarbic on arrival to the hospital if she had put him back on the ventilator at the onset of the problem is she says she did. I suspect that the details of taking care of a home ventilator patient are a bit overwhelming to her and try to help explain all this today. I also emphasized that if at all possible I would like him to be admitted to become system so we can also on the same page in terms of his care  I had an extended discussion with the patient reviewing all relevant studies completed to date and  lasting 15 to 20 minutes of a 25 minute visit    Each maintenance medication was reviewed in detail including most importantly the difference between maintenance and prns and under what circumstances the prns are to be triggered using an action plan format that is not reflected in the computer generated alphabetically organized AVS.    Please see instructions for details which were reviewed in writing and the patient given a copy highlighting the part that I personally wrote and discussed at today's ov.

## 2015-03-07 NOTE — Assessment & Plan Note (Signed)
Body mass index is 47.6 kg/(m^2).  Lab Results  Component Value Date   TSH 0.98 01/04/2015     Contributing to gerd tendency/ ohs/ reviewed the need and the process to achieve and maintain neg calorie balance > defer f/u primary care including intermittently monitoring thyroid status

## 2015-03-08 ENCOUNTER — Ambulatory Visit (INDEPENDENT_AMBULATORY_CARE_PROVIDER_SITE_OTHER): Payer: Medicare Other | Admitting: Endocrinology

## 2015-03-08 ENCOUNTER — Encounter: Payer: Self-pay | Admitting: Endocrinology

## 2015-03-08 VITALS — BP 132/82 | HR 75 | Temp 98.8°F | Ht 67.0 in | Wt 310.0 lb

## 2015-03-08 DIAGNOSIS — E1041 Type 1 diabetes mellitus with diabetic mononeuropathy: Secondary | ICD-10-CM

## 2015-03-08 DIAGNOSIS — E1065 Type 1 diabetes mellitus with hyperglycemia: Secondary | ICD-10-CM

## 2015-03-08 DIAGNOSIS — Z23 Encounter for immunization: Secondary | ICD-10-CM | POA: Diagnosis not present

## 2015-03-08 DIAGNOSIS — IMO0002 Reserved for concepts with insufficient information to code with codable children: Secondary | ICD-10-CM

## 2015-03-08 DIAGNOSIS — E876 Hypokalemia: Secondary | ICD-10-CM

## 2015-03-08 DIAGNOSIS — M109 Gout, unspecified: Secondary | ICD-10-CM | POA: Diagnosis not present

## 2015-03-08 DIAGNOSIS — E1049 Type 1 diabetes mellitus with other diabetic neurological complication: Secondary | ICD-10-CM

## 2015-03-08 LAB — BASIC METABOLIC PANEL
BUN: 21 mg/dL (ref 6–23)
CALCIUM: 8.9 mg/dL (ref 8.4–10.5)
CHLORIDE: 99 meq/L (ref 96–112)
CO2: 37 meq/L — AB (ref 19–32)
CREATININE: 0.8 mg/dL (ref 0.40–1.50)
GFR: 101.54 mL/min (ref >60.00)
GLUCOSE: 132 mg/dL — AB (ref 70–99)
Potassium: 4.5 mEq/L (ref 3.5–5.1)
Sodium: 141 mEq/L (ref 135–145)

## 2015-03-08 MED ORDER — LOSARTAN POTASSIUM 50 MG PO TABS: 50.0000 mg | ORAL_TABLET | Freq: Every day | ORAL | Status: DC

## 2015-03-08 MED ORDER — NYSTATIN 100000 UNIT/GM EX POWD: Freq: Four times a day (QID) | CUTANEOUS | Status: DC

## 2015-03-08 MED ORDER — LIDOCAINE HCL 2 % EX GEL: 1.0000 "application " | Freq: Three times a day (TID) | CUTANEOUS | Status: DC

## 2015-03-08 NOTE — Patient Instructions (Addendum)
i have sent a prescription to your pharmacy, to refill the nystatin powder.  blood tests are requested for you today.  We'll let you know about the results. i have sent a prescription to your pharmacy, to change the lisinopril to a medication which does not affect your breathing Please reduce the supper novolog to 20 units.  check your blood sugar twice a day.  vary the time of day when you check, between before the 3 meals, and at bedtime.  also check if you have symptoms of your blood sugar being too high or too low.  please keep a record of the readings and bring it to your next appointment here.  You can write it on any piece of paper.  please call us sooner if your blood sugar goes below 70, or if you have a lot of readings over 200. Please come back for a follow-up appointment in 2 months.   i have sent a prescription to your pharmacy, to change the lidoderm patch to an ointment.

## 2015-03-08 NOTE — Progress Notes (Signed)
Subjective:    Patient ID: Blake Summers, male    DOB: 12-01-1945, 69 y.o.   MRN: [id number]  HPI The state of at least three ongoing medical problems is addressed today, with interval history of each noted here: Pt returns for f/u of diabetes mellitus: DM type: Insulin-requiring type 2 Dx'ed: 1990 Complications: polyneuropathy, CAD, CVA, and foot ulcer Therapy: insulin since 1999 DKA: never Severe hypoglycemia: never Pancreatitis: never Other: he takes multiple daily injections; he was turned down for weight-loss surgery, due to comorbidities Interval history: He frequently has mild hypoglycemia at HS.  Denies LOC Superficial mycosis: I asked wife, who says it is much better with nystatin powder Pt was recently in the hospital for acute resp failure, and got better with artificial ventilation.  Hypokelemia: pt was rx'ed klor in the hospital, and he still takes.  No cramps.  Past Medical History  Diagnosis Date  . GERD 07/06/2010  . Edema 10/28/2007  . DIABETES MELLITUS, TYPE II 05/04/2007  . HYPERLIPIDEMIA 07/08/2008  . ALLERGIC RHINITIS 07/08/2008  . GLAUCOMA 07/08/2008  . CEREBROVASCULAR ACCIDENT, HX OF 07/08/2008  . OBSTRUCTIVE SLEEP APNEA 03/06/2008  . DIABETIC ULCER, LEFT LEG 08/23/2009  . 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 HYPERTENSION 10/28/2007  . VENOUS INSUFFICIENCY 03/08/2009  . FATTY LIVER DISEASE 05/19/2008  . BENIGN PROSTATIC HYPERTROPHY 07/08/2008  . 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 NEPHROLITHIASIS, HX OF 07/08/2008  . Morbid obesity   . DM nephropathy/sclerosis   . 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 Lumbar spondylosis   . Diarrhea 06/08/2013    Past Surgical History  Procedure Laterality Date  . Lithotripsy  2007  . Tracheostomy tube placement N/A 07/14/2014    Procedure: TRACHEOSTOMY;  Surgeon: Darletta Moll, MD;  Location: [facility] OR;  Service: ENT;  Laterality: N/A;    Social History   Social History  . Marital Status: Married    Spouse Name: N/A  . Number of Children: N/A  . Years of Education: N/A   Occupational History  . Retired     Nature conservation officer   Social History Main Topics  . Smoking status: Former Smoker -- 0.50 packs/day for 15 years    Types: Cigarettes    Quit date: 06/12/1988  . Smokeless tobacco: Never Used  . Alcohol Use: No  . Drug Use: No  . Sexual Activity: Not on file   Other Topics Concern  . Not on file   Social History Narrative   Diet is "good"   Exercise is limited by medical problems    Current Outpatient Prescriptions on File Prior to Visit  Medication Sig Dispense Refill  . aspirin 81 MG chewable tablet Chew 1 tablet (81 mg total) by mouth daily.    . brimonidine (ALPHAGAN) 0.15 % ophthalmic solution Place 1 drop into both eyes 2 (two) times daily.     Marland Kitchen dexlansoprazole (DEXILANT) 60 MG capsule Take 1 capsule (60 mg total) by mouth daily. 30 capsule 6  . diltiazem (CARDIZEM) 60 MG tablet Take 1 tablet (60 mg total) by mouth every 8 (eight) hours. 90 tablet 11  . dorzolamide-timolol (COSOPT) 22.3-6.8 MG/ML ophthalmic solution Place 1 drop into both eyes 2 (two) times daily.     . famotidine (PEPCID) 20 MG tablet Take 20 mg by mouth 2 (two) times daily.    . furosemide (LASIX) 80 MG tablet Take 80 mg by mouth daily.     . Insulin Glargine (LANTUS SOLOSTAR) 100 UNIT/ML  Solostar Pen Inject 45 Units into the skin daily at 10 pm. 5 pen 2  . latanoprost (XALATAN) 0.005 % ophthalmic solution Place 1 drop into both eyes at bedtime. 2.5 mL 4  . Melatonin 3 MG CAPS Take 1 capsule by mouth daily.     Marland Kitchen morphine (MS CONTIN) 15 MG 12 hr tablet Take 1 tablet (15 mg total) by mouth every 12 (twelve) hours. 60 tablet 0  . ONETOUCH VERIO test strip CHECK BLOOD SUGAR TWICE A DAY 100 each 5  . oxyCODONE-acetaminophen (PERCOCET) 7.5-325 MG per tablet Take 1 tablet by mouth every 8 (eight) hours as needed (pain).    . polyethylene glycol powder (GLYCOLAX/MIRALAX) powder MIX 1 CAPFUL (17 GRAMS)  IN 8 TO 10 OUNCES OF LIQUID AND DRINK ONCE DAILY AS DIRECTED 527 g 2  . potassium chloride (K-DUR) 10 MEQ tablet Take 10 mEq by mouth daily.    . pravastatin (PRAVACHOL) 40 MG tablet Take 1 tablet (40 mg total) by mouth daily at 6 PM. 90 tablet 3  . senna (SENOKOT) 8.6 MG TABS tablet Take 1 tablet (8.6 mg total) by mouth daily as needed for mild constipation. 50 each 5  . tamsulosin (FLOMAX) 0.4 MG CAPS capsule Take 1 capsule (0.4 mg total) by mouth daily. 30 capsule 6  . topiramate (TOPAMAX) 50 MG tablet Take 1 tablet (50 mg total) by mouth at bedtime. 30 tablet 2  . torsemide (DEMADEX) 20 MG tablet Take 1 tablet (20 mg total) by mouth daily. 30 tablet 11  . warfarin (COUMADIN) 6 MG tablet Take 2 tablets (12 mg total) by mouth one time only at 6 PM. Take as directed by anticoagulation clinic 60 tablet 3  . zolpidem (AMBIEN) 10 MG tablet Take 1 tablet (10 mg total) by mouth at bedtime as needed for sleep. 30 tablet 5  . ketoconazole (NIZORAL) 2 % shampoo Apply 1 application topically once a week.      No current facility-administered medications on file prior to visit.    Allergies  Allergen Reactions  . Neosporin [Neomycin-Polymyxin-Gramicidin] Other (See Comments)    unknown    Family History  Problem Relation Age of Onset  . Cancer Brother     Colon Cancer  . Heart disease Brother   . Heart disease Brother   . Asthma Mother     BP 132/82 mmHg  Pulse 75  Temp(Src) 98.8 F (37.1 C) (Oral)  Ht  (1.702 m)  Wt 310 lb (140.615 kg)  BMI 48.54 kg/m2  SpO2 88%   Review of Systems Denies weight change.  He also has itching at the inguinal areas    Objective:   Physical Exam VITAL SIGNS:  See vs page GENERAL: no distress.  Morbid obesity.  In wheelchair.  Has 02 on.   Skin: at both inguinal areas, he has moderate red rash   Lab Results  Component Value Date   CREATININE 0.80 03/08/2015   BUN 21 03/08/2015   NA 141 03/08/2015   K 4.5 03/08/2015   CL 99 03/08/2015    CO2 37* 03/08/2015      Assessment & Plan:  Hypokalemia: well-replaced: Please continue the same medication. DM: slightly overcontrolled Rash, improved, but persists. resp failure: continue f/u with pulm HTN: well-controlled  Patient is advised the following: Patient Instructions  i have sent a prescription to your pharmacy, to refill the nystatin powder.  blood tests are requested for you today.  We'll let you know about the results. i  have sent a prescription to your pharmacy, to change the lisinopril to a medication which does not affect your breathing Please reduce the supper novolog to 20 units.  check your blood sugar twice a day.  vary the time of day when you check, between before the 3 meals, and at bedtime.  also check if you have symptoms of your blood sugar being too high or too low.  please keep a record of the readings and bring it to your next appointment here.  You can write it on any piece of paper.  please call us sooner if your blood sugar goes below 70, or if you have a lot of readings over 200. Please come back for a follow-up appointment in 2 months.   i have sent a prescription to your pharmacy, to change the lidoderm patch to an ointment.

## 2015-03-09 ENCOUNTER — Telehealth: Payer: Self-pay | Admitting: Endocrinology

## 2015-03-09 NOTE — Telephone Encounter (Signed)
Amber from [facility] stated that patient wife would like ask if a speech therapist could come out and help with patient short term memory please advise, [phone]

## 2015-03-09 NOTE — Telephone Encounter (Signed)
See note below and please advise, Thanks! 

## 2015-03-09 NOTE — Telephone Encounter (Signed)
ok 

## 2015-03-09 NOTE — Telephone Encounter (Signed)
I contacted [facility] doctor with [facility] and gave verbal order for Speech Therapy.

## 2015-03-15 DIAGNOSIS — J9622 Acute and chronic respiratory failure with hypercapnia: Secondary | ICD-10-CM | POA: Diagnosis not present

## 2015-03-16 ENCOUNTER — Ambulatory Visit: Payer: Medicare Other | Admitting: Internal Medicine

## 2015-03-18 ENCOUNTER — Other Ambulatory Visit: Payer: Self-pay | Admitting: Endocrinology

## 2015-03-25 ENCOUNTER — Other Ambulatory Visit: Payer: Self-pay | Admitting: Endocrinology

## 2015-04-07 ENCOUNTER — Ambulatory Visit: Payer: Medicare Other | Admitting: General Practice

## 2015-04-07 ENCOUNTER — Ambulatory Visit (INDEPENDENT_AMBULATORY_CARE_PROVIDER_SITE_OTHER): Payer: Medicare Other | Admitting: General Practice

## 2015-04-07 DIAGNOSIS — Z5181 Encounter for therapeutic drug level monitoring: Secondary | ICD-10-CM | POA: Diagnosis not present

## 2015-04-07 DIAGNOSIS — Z8679 Personal history of other diseases of the circulatory system: Secondary | ICD-10-CM

## 2015-04-07 DIAGNOSIS — I4891 Unspecified atrial fibrillation: Secondary | ICD-10-CM

## 2015-04-07 LAB — POCT INR: INR: 1.9

## 2015-04-07 NOTE — Progress Notes (Signed)
Pre visit review using our clinic review tool, if applicable. No additional management support is needed unless otherwise documented below in the visit note. 

## 2015-04-08 NOTE — Progress Notes (Signed)
I have reviewed and agree with the plan. 

## 2015-04-13 ENCOUNTER — Telehealth: Payer: Self-pay

## 2015-04-13 ENCOUNTER — Other Ambulatory Visit: Payer: Self-pay | Admitting: Endocrinology

## 2015-04-13 NOTE — Telephone Encounter (Signed)
Left voicemail advising OT of verbal orders.

## 2015-04-13 NOTE — Telephone Encounter (Signed)
Ritu Occupational Therapist need a verbal order to see Patient 1 x a week For four weeks, please advise [phone]

## 2015-04-13 NOTE — Telephone Encounter (Signed)
Ritu Occupational Therapistu need a verbal order to see Patient 1 x a week  For four weeks, please advise [phone]

## 2015-04-13 NOTE — Telephone Encounter (Signed)
ok 

## 2015-04-14 ENCOUNTER — Other Ambulatory Visit: Payer: Self-pay | Admitting: Internal Medicine

## 2015-04-14 MED ORDER — WARFARIN SODIUM 6 MG PO TABS: ORAL_TABLET | ORAL | Status: DC

## 2015-04-14 NOTE — Telephone Encounter (Signed)
Please advise, pt needing refill on Warfarin.

## 2015-04-14 NOTE — Telephone Encounter (Signed)
Pt's wife calling requesting a refill on Warfarin 6 mg. Wife stated that pt only has one day left. Please advise

## 2015-04-19 DIAGNOSIS — Z0279 Encounter for issue of other medical certificate: Secondary | ICD-10-CM

## 2015-04-20 ENCOUNTER — Encounter: Payer: Medicare Other | Admitting: Physical Medicine & Rehabilitation

## 2015-04-29 ENCOUNTER — Telehealth: Payer: Self-pay | Admitting: Endocrinology

## 2015-04-29 NOTE — Telephone Encounter (Signed)
ok 

## 2015-04-29 NOTE — Telephone Encounter (Signed)
Ritu from [facility] called stating that she would like a verbal order to continue service   Order: Occ. Therapy 1 time a week for 6 weeks  Call back: [phone]    Please advise    Thank you

## 2015-04-29 NOTE — Telephone Encounter (Signed)
I contacted occupational therapist and gave verbal ok per Dr. Everardo AllEllison.

## 2015-04-29 NOTE — Telephone Encounter (Signed)
See note below and please advise, Thanks! 

## 2015-05-03 ENCOUNTER — Encounter: Payer: Medicare Other | Attending: Registered Nurse | Admitting: Registered Nurse

## 2015-05-03 ENCOUNTER — Encounter: Payer: Self-pay | Admitting: Registered Nurse

## 2015-05-03 VITALS — BP 144/62 | HR 65 | Resp 14

## 2015-05-03 DIAGNOSIS — M179 Osteoarthritis of knee, unspecified: Secondary | ICD-10-CM | POA: Diagnosis not present

## 2015-05-03 DIAGNOSIS — Z930 Tracheostomy status: Secondary | ICD-10-CM | POA: Insufficient documentation

## 2015-05-03 DIAGNOSIS — M1712 Unilateral primary osteoarthritis, left knee: Secondary | ICD-10-CM

## 2015-05-03 DIAGNOSIS — T40605A Adverse effect of unspecified narcotics, initial encounter: Secondary | ICD-10-CM | POA: Diagnosis not present

## 2015-05-03 DIAGNOSIS — Z5181 Encounter for therapeutic drug level monitoring: Secondary | ICD-10-CM

## 2015-05-03 DIAGNOSIS — M1711 Unilateral primary osteoarthritis, right knee: Secondary | ICD-10-CM

## 2015-05-03 DIAGNOSIS — J9620 Acute and chronic respiratory failure, unspecified whether with hypoxia or hypercapnia: Secondary | ICD-10-CM | POA: Insufficient documentation

## 2015-05-03 DIAGNOSIS — M545 Low back pain, unspecified: Secondary | ICD-10-CM

## 2015-05-03 DIAGNOSIS — Z760 Encounter for issue of repeat prescription: Secondary | ICD-10-CM | POA: Insufficient documentation

## 2015-05-03 DIAGNOSIS — M25561 Pain in right knee: Secondary | ICD-10-CM | POA: Diagnosis not present

## 2015-05-03 DIAGNOSIS — G8929 Other chronic pain: Secondary | ICD-10-CM | POA: Diagnosis present

## 2015-05-03 DIAGNOSIS — K5909 Other constipation: Secondary | ICD-10-CM | POA: Insufficient documentation

## 2015-05-03 DIAGNOSIS — Z79899 Other long term (current) drug therapy: Secondary | ICD-10-CM

## 2015-05-03 MED ORDER — OXYCODONE-ACETAMINOPHEN 7.5-325 MG PO TABS: 1.0000 | ORAL_TABLET | Freq: Three times a day (TID) | ORAL | Status: DC | PRN

## 2015-05-03 MED ORDER — MORPHINE SULFATE ER 15 MG PO TBCR: 15.0000 mg | EXTENDED_RELEASE_TABLET | Freq: Two times a day (BID) | ORAL | Status: DC

## 2015-05-03 NOTE — Progress Notes (Signed)
Subjective:    Patient ID: Blake Summers, male    DOB: 07/13/1945, 69 y.o.   MRN: [id number]  HPI: Mr. Blake SalenJoseph L Grieger is a 69 year old male who returns for follow up for chronic pain and medication refill. He says his pain is located in his lower back and right knee. He rates his pain 8. His current exercise regime is performing stretching exercises and light weight, also receiving occupational therapy weekly. He arrived to clinic in his motorized wheelchair. Has bilateral velcro wraps to lower extremities.  [name] was admitted to [facility] on 03/01/2015 and discharge 03/03/2015 for Acute on Chronic Respiratory failure with hypercapnia, Ms. Dunford states" he slumped over became unresponsive and EMS was called.   Pain Inventory Average Pain 7 Pain Right Now 8 My pain is sharp, stabbing and aching  In the last 24 hours, has pain interfered with the following? General activity 8 Relation with others 9 Enjoyment of life 9 What TIME of day is your pain at its worst? daytime, evening  Sleep (in general) Poor  Pain is worse with: walking Pain improves with: rest and medication Relief from Meds: 8  Mobility use a walker how many minutes can you walk? 0 ability to climb steps?  no do you drive?  yes use a wheelchair needs help with transfers  Function retired I need assistance with the following:  dressing, bathing, toileting, meal prep, household duties and shopping  Neuro/Psych weakness numbness tingling trouble walking dizziness  Prior Studies Any changes since last visit?  no  Physicians involved in your care Any changes since last visit?  no   Family History  Problem Relation Age of Onset  . Cancer Brother     Colon Cancer  . Heart disease Brother   . Heart disease Brother   . Asthma Mother    Social History   Social History  . Marital Status: Married    Spouse Name: N/A  . Number of Children: N/A  . Years of Education: N/A    Occupational History  . Retired     Nature conservation officeroperations manager   Social History Main Topics  . Smoking status: Former Smoker -- 0.50 packs/day for 15 years    Types: Cigarettes    Quit date: 06/12/1988  . Smokeless tobacco: Never Used  . Alcohol Use: No  . Drug Use: No  . Sexual Activity: Not Asked   Other Topics Concern  . None   Social History Narrative   Diet is "good"   Exercise is limited by medical problems   Past Surgical History  Procedure Laterality Date  . Lithotripsy  2007  . Tracheostomy tube placement N/A 07/14/2014    Procedure: TRACHEOSTOMY;  Surgeon: Darletta MollSui W Teoh, MD;  [facility];  Service: ENT;  Laterality: N/A;   Past Medical History  Diagnosis Date  . GERD 07/06/2010  . Edema 10/28/2007  . DIABETES MELLITUS, TYPE II 05/04/2007  . HYPERLIPIDEMIA 07/08/2008  . ALLERGIC RHINITIS 07/08/2008  . GLAUCOMA 07/08/2008  . CEREBROVASCULAR ACCIDENT, HX OF 07/08/2008  . OBSTRUCTIVE SLEEP APNEA 03/06/2008  . DIABETIC ULCER, LEFT LEG 08/23/2009  . 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 HYPERTENSION 10/28/2007  . VENOUS INSUFFICIENCY 03/08/2009  . FATTY LIVER DISEASE 05/19/2008  . BENIGN PROSTATIC HYPERTROPHY 07/08/2008  . 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 NEPHROLITHIASIS, HX OF 07/08/2008  . Morbid obesity (HCC)   . DM nephropathy/sclerosis   . 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  Lumbar spondylosis   . Diarrhea 06/08/2013   BP 144/62 mmHg  Pulse 65  Resp 14  SpO2 95%  Opioid Risk Score:   Fall Risk Score:  `1  Depression screen PHQ 2/9  Depression screen [facility] 2/9 02/16/2015 02/04/2015 12/29/2014 12/08/2014 10/20/2014  Decreased Interest 0 0 0 0 0  Down, Depressed, Hopeless 0 0 0 0 0  PHQ - 2 Score 0 0 0 0 0  Altered sleeping - - - - 0  Tired, decreased energy - - - - 2  Change in appetite - - - - 0  Feeling bad or failure about yourself  - - - - 0  Trouble concentrating - - - - 0  Moving slowly or fidgety/restless - - - - 0  Suicidal  thoughts - - - - 0  PHQ-9 Score - - - - 2     Review of Systems  Musculoskeletal: Positive for gait problem.  Neurological: Positive for dizziness, weakness and numbness.       Tingling       Objective:   Physical Exam  Constitutional: He is oriented to person, place, and time. He appears well-developed and well-nourished.  Morbid Obesity  HENT:  Head: Normocephalic and atraumatic.  Neck: Normal range of motion. Neck supple.  Cardiovascular: Normal rate and regular rhythm.   Pulmonary/Chest: Effort normal and breath sounds normal.  Continuous oxygen 3 liters via PMV/ Trach  Musculoskeletal:  Normal Muscle Bulk and Muscle Testing Reveals: Upper Extremities: Full ROM and Muscle Strength 5/5 Lower Extremities: Full ROM and Muscle Strength 5/5 Right Lower Extremity Flexion Produces pain into Patella Arrives in Motorized wheelchair   Neurological: He is alert and oriented to person, place, and time.  Skin: Skin is warm and dry.  Psychiatric: He has a normal mood and affect.  Nursing note and vitals reviewed.         Assessment & Plan:  1. End-stage right knee osteoarthritis: Continue to use Voltaren gel Continue MS contin 15 mg #60. One tablet every 12 hours andRefilled: Percocet 7.5/325 mg # 90 pills--use every 8 hours as needed for pain. No more than 3 a day. Second script's for the following month. 2. Morbid obesity with generalized deconditioning:  Continue to monitor 3. Chronic low back pain/spondylosis: Continue MS contin with prn percocet.  5. Narcotic induced constipation: Continue with Bowel Program: Senna, Colace, Miralax as prescribed. 6. Acute on Chronic Respiratory Failure: Continuous Oxygen: S/P Tracheostomy/ PMV/ Vent at HS.  20 minutes of face to face patient care time was spent during this visit. All questions were encouraged and answered.   F/U in 2 month

## 2015-05-05 ENCOUNTER — Ambulatory Visit (INDEPENDENT_AMBULATORY_CARE_PROVIDER_SITE_OTHER): Payer: Medicare Other | Admitting: General Practice

## 2015-05-05 DIAGNOSIS — Z5181 Encounter for therapeutic drug level monitoring: Secondary | ICD-10-CM

## 2015-05-05 DIAGNOSIS — Z8679 Personal history of other diseases of the circulatory system: Secondary | ICD-10-CM | POA: Diagnosis not present

## 2015-05-05 DIAGNOSIS — I4891 Unspecified atrial fibrillation: Secondary | ICD-10-CM | POA: Diagnosis not present

## 2015-05-05 LAB — POCT INR: INR: 2.1

## 2015-05-05 NOTE — Progress Notes (Signed)
Pre visit review using our clinic review tool, if applicable. No additional management support is needed unless otherwise documented below in the visit note. 

## 2015-05-05 NOTE — Progress Notes (Signed)
I have reviewed and agree with the plan. 

## 2015-05-13 ENCOUNTER — Telehealth: Payer: Self-pay | Admitting: Endocrinology

## 2015-05-13 NOTE — Telephone Encounter (Signed)
Blake Summers with [facility] requesting orders for nurse and PT # [phone]

## 2015-05-14 ENCOUNTER — Telehealth: Payer: Self-pay | Admitting: General Practice

## 2015-05-14 ENCOUNTER — Encounter: Payer: Self-pay | Admitting: Endocrinology

## 2015-05-14 ENCOUNTER — Ambulatory Visit
Admission: RE | Admit: 2015-05-14 | Discharge: 2015-05-14 | Disposition: A | Discharge status: Home / Self Care | Payer: Medicare Other | Source: Ambulatory Visit | Attending: Endocrinology | Admitting: Endocrinology

## 2015-05-14 ENCOUNTER — Encounter: Payer: Medicare Other | Attending: Endocrinology | Admitting: Dietician

## 2015-05-14 ENCOUNTER — Encounter: Payer: Self-pay | Admitting: Internal Medicine

## 2015-05-14 ENCOUNTER — Ambulatory Visit (INDEPENDENT_AMBULATORY_CARE_PROVIDER_SITE_OTHER): Payer: Medicare Other | Admitting: Endocrinology

## 2015-05-14 ENCOUNTER — Telehealth: Payer: Self-pay

## 2015-05-14 VITALS — Wt 330.0 lb

## 2015-05-14 VITALS — BP 146/82 | HR 73 | Temp 98.6°F | Ht 67.0 in | Wt 330.0 lb

## 2015-05-14 DIAGNOSIS — E1065 Type 1 diabetes mellitus with hyperglycemia: Secondary | ICD-10-CM

## 2015-05-14 DIAGNOSIS — E1040 Type 1 diabetes mellitus with diabetic neuropathy, unspecified: Secondary | ICD-10-CM | POA: Insufficient documentation

## 2015-05-14 DIAGNOSIS — Z713 Dietary counseling and surveillance: Secondary | ICD-10-CM | POA: Diagnosis not present

## 2015-05-14 DIAGNOSIS — R9389 Abnormal findings on diagnostic imaging of other specified body structures: Secondary | ICD-10-CM

## 2015-05-14 DIAGNOSIS — R938 Abnormal findings on diagnostic imaging of other specified body structures: Secondary | ICD-10-CM

## 2015-05-14 DIAGNOSIS — IMO0002 Reserved for concepts with insufficient information to code with codable children: Secondary | ICD-10-CM

## 2015-05-14 DIAGNOSIS — E1159 Type 2 diabetes mellitus with other circulatory complications: Secondary | ICD-10-CM

## 2015-05-14 MED ORDER — FLUCONAZOLE 150 MG PO TABS: 150.0000 mg | ORAL_TABLET | Freq: Every day | ORAL | Status: DC

## 2015-05-14 MED ORDER — NYSTATIN 100000 UNIT/GM EX POWD: Freq: Four times a day (QID) | CUTANEOUS | Status: DC

## 2015-05-14 MED ORDER — INSULIN GLARGINE 100 UNIT/ML SOLOSTAR PEN: PEN_INJECTOR | SUBCUTANEOUS | Status: DC

## 2015-05-14 MED ORDER — LIDOCAINE HCL 2 % EX GEL: 1.0000 "application " | Freq: Three times a day (TID) | CUTANEOUS | Status: AC

## 2015-05-14 MED ORDER — ZOSTER VACCINE LIVE 19400 UNT/0.65ML ~~LOC~~ SOLR: 0.6500 mL | Freq: Once | SUBCUTANEOUS | Status: DC

## 2015-05-14 MED ORDER — FUROSEMIDE 80 MG PO TABS: 80.0000 mg | ORAL_TABLET | Freq: Two times a day (BID) | ORAL | Status: DC

## 2015-05-14 NOTE — Telephone Encounter (Signed)
ok 

## 2015-05-14 NOTE — Telephone Encounter (Signed)
Order faxed to [facility] to check patient's INR on Monday, 05-17-15 and to call results to Bailey Mechindy Boyd, RN @ [phone].

## 2015-05-14 NOTE — Progress Notes (Signed)
Subjective:    Patient ID: Blake Summers, male    DOB: 1946-01-20, 69 y.o.   MRN: [id number]  HPI The state of at least three ongoing medical problems is addressed today, with interval history of each noted here: Pt returns for f/u of diabetes mellitus: DM type: Insulin-requiring type 2 Dx'ed: 1990 Complications: polyneuropathy, CAD, CVA, and foot ulcer Therapy: insulin since 1999 DKA: never Severe hypoglycemia: never Pancreatitis: never Other: he takes multiple daily injections; he was turned down for weight-loss surgery, due to comorbidities Interval history: he brings a record of his cbg's which i have reviewed today.  It varies from 66-200.  There is no trend throughout the day.   resp failure: he uses ventilator at night, and PRN during the day.  Pt and wife say 02 sat is in the 90's at home.  Use of the speaking valve also reduces the 02 sat slightly.   Chronic back and bilat knee pain, due to OA: MS Contin controls pain well.     Past Medical History  Diagnosis Date  . GERD 07/06/2010  . Edema 10/28/2007  . DIABETES MELLITUS, TYPE II 05/04/2007  . HYPERLIPIDEMIA 07/08/2008  . ALLERGIC RHINITIS 07/08/2008  . GLAUCOMA 07/08/2008  . CEREBROVASCULAR ACCIDENT, HX OF 07/08/2008  . OBSTRUCTIVE SLEEP APNEA 03/06/2008  . DIABETIC ULCER, LEFT LEG 08/23/2009  . 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 HYPERTENSION 10/28/2007  . VENOUS INSUFFICIENCY 03/08/2009  . FATTY LIVER DISEASE 05/19/2008  . BENIGN PROSTATIC HYPERTROPHY 07/08/2008  . 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 NEPHROLITHIASIS, HX OF 07/08/2008  . Morbid obesity (HCC)   . DM nephropathy/sclerosis   . 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 Lumbar spondylosis   . Diarrhea 06/08/2013    Past Surgical History  Procedure Laterality Date  . Lithotripsy  2007  . Tracheostomy tube placement N/A 07/14/2014    Procedure: TRACHEOSTOMY;  Surgeon: Darletta MollSui W Teoh, MD;  Location: [facility] OR;  Service: ENT;  Laterality: N/A;     Social History   Social History  . Marital Status: Married    Spouse Name: N/A  . Number of Children: N/A  . Years of Education: N/A   Occupational History  . Retired     Nature conservation officeroperations manager   Social History Main Topics  . Smoking status: Former Smoker -- 0.50 packs/day for 15 years    Types: Cigarettes    Quit date: 06/12/1988  . Smokeless tobacco: Never Used  . Alcohol Use: No  . Drug Use: No  . Sexual Activity: Not on file   Other Topics Concern  . Not on file   Social History Narrative   Diet is "good"   Exercise is limited by medical problems    Current Outpatient Prescriptions on File Prior to Visit  Medication Sig Dispense Refill  . aspirin 81 MG chewable tablet Chew 1 tablet (81 mg total) by mouth daily.    . brimonidine (ALPHAGAN) 0.15 % ophthalmic solution Place 1 drop into both eyes 2 (two) times daily.     Marland Kitchen. dexlansoprazole (DEXILANT) 60 MG capsule Take 1 capsule (60 mg total) by mouth daily. 30 capsule 6  . diltiazem (CARDIZEM) 60 MG tablet Take 1 tablet (60 mg total) by mouth every 8 (eight) hours. 90 tablet 11  . dorzolamide-timolol (COSOPT) 22.3-6.8 MG/ML ophthalmic solution Place 1 drop into both eyes 2 (two) times daily.     . famotidine (PEPCID) 20 MG tablet Take 20 mg by mouth 2 (  two) times daily.    . insulin aspart (NOVOLOG FLEXPEN) 100 UNIT/ML FlexPen Inject into the skin 3 (three) times daily with meals. 3 times a day (just before each meal) 20-20-10 units    . ketoconazole (NIZORAL) 2 % shampoo Apply 1 application topically once a week.     . latanoprost (XALATAN) 0.005 % ophthalmic solution Place 1 drop into both eyes at bedtime. 2.5 mL 4  . losartan (COZAAR) 50 MG tablet Take 1 tablet (50 mg total) by mouth daily. 30 tablet 11  . morphine (MS CONTIN) 15 MG 12 hr tablet Take 1 tablet (15 mg total) by mouth every 12 (twelve) hours. 60 tablet 0  . ONETOUCH VERIO test strip CHECK BLOOD SUGAR TWICE A DAY 100 each 5  . oxyCODONE-acetaminophen  (PERCOCET) 7.5-325 MG tablet Take 1 tablet by mouth every 8 (eight) hours as needed (pain). 90 tablet 0  . polyethylene glycol powder (GLYCOLAX/MIRALAX) powder MIX 1 CAPFUL (17 GRAMS) IN 8 TO 10 OUNCES OF LIQUID AND DRINK ONCE DAILY ASDIRECTED 527 g 2  . potassium chloride (K-DUR) 10 MEQ tablet Take 10 mEq by mouth daily.    . pravastatin (PRAVACHOL) 40 MG tablet Take 1 tablet (40 mg total) by mouth daily at 6 PM. 90 tablet 3  . senna (SENOKOT) 8.6 MG TABS tablet Take 1 tablet (8.6 mg total) by mouth daily as needed for mild constipation. 50 each 5  . tamsulosin (FLOMAX) 0.4 MG CAPS capsule Take 1 capsule (0.4 mg total) by mouth daily. 30 capsule 6  . topiramate (TOPAMAX) 50 MG tablet Take 1 tablet (50 mg total) by mouth at bedtime. 30 tablet 2  . warfarin (COUMADIN) 6 MG tablet Take up to 2 tablets daily or take as directed by anticoagulation clinic 60 tablet 3  . zolpidem (AMBIEN) 10 MG tablet Take 1 tablet (10 mg total) by mouth at bedtime as needed for sleep. 30 tablet 5   No current facility-administered medications on file prior to visit.    Allergies  Allergen Reactions  . Neosporin [Neomycin-Polymyxin-Gramicidin] Other (See Comments)    unknown    Family History  Problem Relation Age of Onset  . Cancer Brother     Colon Cancer  . Heart disease Brother   . Heart disease Brother   . Asthma Mother     BP 146/82 mmHg  Pulse 73  Temp(Src) 98.6 F (37 C) (Oral)  Ht  (1.702 m)  Wt 330 lb (149.687 kg)  BMI 51.67 kg/m2  SpO2 89%  Review of Systems Denies LOC    Objective:   Physical Exam VITAL SIGNS:  See vs page GENERAL: no distress.  In wheelchair.  Has 02 on.  Morbid obesity.   LUNGS:  Clear to auscultation, except for decreased BS at the bases.  Skin: at the abd skin folds, erythema is again noted.     (I asked wife, who says a1c was 5.8%, 2 weeks ago). CXR: improved.    Assessment & Plan:  resp failure, persistent.  F/u as sched with pulm.  increase the  furosemide to twice a day.   DM: overcontrolled Superficial mycosis, worse AF: there is an interaction between coumadin and fluconazole.   Chronic pain syndrome: Despite risk of hypoventilation, he is on it for QOL, considering poor prognosis.     Patient is advised the following: Patient Instructions  Please reduce insulins to the numbers listed below.    check your blood sugar twice a day.  vary the  time of day when you check, between before the 3 meals, and at bedtime.  also check if you have symptoms of your blood sugar being too high or too low.  please keep a record of the readings and bring it to your next appointment here.  You can write it on any piece of paper.  please call us sooner if your blood sugar goes below 70, or if you have a lot of readings over 200.   Please come back for a follow-up appointment in 3 months.  i have sent a prescription to your pharmacy, for a larger quantity of the lidocaine gel.  i have also sent a prescription to your pharmacy, to refill the nystatin powder, and for a pill for this. We'll call coumadin clinic also, due to an interaction.

## 2015-05-14 NOTE — Patient Instructions (Addendum)
Please reduce insulins to the numbers listed below.    check your blood sugar twice a day.  vary the time of day when you check, between before the 3 meals, and at bedtime.  also check if you have symptoms of your blood sugar being too high or too low.  please keep a record of the readings and bring it to your next appointment here.  You can write it on any piece of paper.  please call us sooner if your blood sugar goes below 70, or if you have a lot of readings over 200.   Please come back for a follow-up appointment in 3 months.  i have sent a prescription to your pharmacy, for a larger quantity of the lidocaine gel.  i have also sent a prescription to your pharmacy, to refill the nystatin powder, and for a pill for this. We'll call coumadin clinic also, due to an interaction.

## 2015-05-14 NOTE — Telephone Encounter (Signed)
See note below and please advise, Thanks! 

## 2015-05-14 NOTE — Telephone Encounter (Signed)
I contacted [facility] at the number listed below and let a voicemail giving the requested verbal orders.

## 2015-05-14 NOTE — Telephone Encounter (Signed)
I contacted the coumadin clinic and advised we started Diflucan 150 mg for this patient. Coumadin clinic nurse Arline AspCindy, stated she would fax [facility] and possibly have them check him on Monday if not she will check him in the office next week.

## 2015-05-14 NOTE — Progress Notes (Signed)
Medical Nutrition Therapy:  Appt start time:  end time:  1230.  Assessment: 12/08/14: Primary concerns today: Patient is here today with his wife.  Wife would like to know if they are doing the right thing with patient's nutrition to help him lose weight and control his blood sugar.  Hx includes type 2 diabetes for the past 20 years.  Hx also includes obstructive sleep He presents in a wheel chair.  He is oxygen dependent and has a trach.  He connects himself to a ventilator each time he lays down.  Recent hospitalization Jan 27-April 28 ([facility]).   HgbA1C 7.7% 11/04/14.  Checks his blood sugar twice daily and wife reports improvement in numbers since he is back home.    Patient lives with his wife.  They watch grandchildren at times.  Wife does the shopping and cooking.  Eats out rarely.  Patient is concerned that a very low calorie diet would not be sustainable.  He complains of increased hunger and eats increased snacks.  When at home, he is in his room almost all of the time.  He eats all meals and snacks in front of the TV.  He is unwilling to change this.  Patient reports desire to lose weight and the benefits that it would have on improving his health.  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  Wife is extremely supportive.  02/04/15: Patient is here with his wife.  She has been reducing his portion sizes, using a smaller plate, and reducing the fat content of meals. He rarely snacks now.   He continues a no added salt diet when at home.  Patient states that he is hungry usually but understands need to change intake and lose weight.  He has been too nervous to use the pool with the trach.  He continues to use the weights and do armchair exercises at home.   He has lost 2 lbs since last visit and feels that his clothes fit differently.  They do not feel that the 7/25 weight was accurate.  His HgbA1C has reduced from 7.7% in May to 6.3% today.  He states that he is often hungry.   Discussed that food is often used for emotional reasons and worked to identify these.    Wt Readings from Last 3 Encounters:  05/14/15 330 lb (149.687 kg)  05/14/15 330 lb (149.687 kg)  03/08/15 310 lb (140.615 kg)   05/14/15: Patient is here with his wife.  Wife is concerned because his weight is up to 330 lbs today and was 311 lbs 2 weeks ago.  Discussed that this is probably fluid.   Breakfast was high in sodium this am due to ham, cheese, and cottage cheese.  Usual intake of 2 meals per day with 1 snack.  Low blood sugar of 62 last night treated with juice.  Low blood sugar once per month recently.  Last HgbA1C per wife of 5.8% 2 weeks ago.  CBG's have been 66-200 with no trend.  He was turned down for weight loss surgery secondary to co morbidities.    Preferred Learning Style:   No preference indicated   Learning Readiness:   Contemplating  MEDICATIONS: Lantus decreased to 20 units every HS and Novolog decreased to 20 units with breakfast and lunch and 10 units with dinner.  Lasix and Demadex.   DIETARY INTAKE: Patient states that he does not add any added salt to foods.  Wife grows her own  herbs and vegetables.  Usually 2 meals per day with 1-2 snacks. 24-hr recall:  B (8-9 AM): 2 eggs, 2 slices toast, 1 oz ham, 1 slice cheese, 1/4 cup cottage cheese, 3 slices mandarin oranges Snk ( AM): none  L (1-2 PM):  Soup or skips at times. Snk ( PM): none or protein bar or popcorn D ( PM): trout, cabbage, mixed vegetables (green beans, asparagus, carrots, corn)  Snk ( PM):protein bar at times (wife limits these to 1 per day) Beverages: 1% milk, water, sugar free lemonade, coffee with half and half, hot tea  Usual physical activity: ADL's.  , dumbbells, resistance bands and other armchair exercises, Just built a pool for patient to do water exercises.  Will use a hoyer lift to get in.    Estimated energy needs: 1800 calories 200 g carbohydrates 135 g protein 50 g fat  Progress  Towards Goal(s):  In progress.   Nutritional Diagnosis:  NB-1.1 Food and nutrition-related knowledge deficit As related to balance of carbohydrate, protein and fat.  As evidenced by diet hx.    Intervention:  Nutrition counseling and diabetes education continued.  Further discussed low sodium guidelines, label reading.  Reviewed diet.  Overall consistent in carbohydrate and appropriate portions.  Continue to avoid added salt at the table and in cooking. Be mindful of your appetite and when you are full/satisfied.  Avoid being overstuffed.  Stop when you are satisfied. Great job on the changes that you have made! Consider a food journal.  Read labels for sodium.  Limit sodium to 2000 mg per day.  Decreased processed meat and cheese. Continue with diet with small amounts of consistent carbohydrate and avoiding added salt. Continue exercise as able. Continue medications as prescribed. Check blood sugar daily as directed by MD.  Teaching Method Utilized:  Visual Auditory  Handouts given during visit include:  Hunger/Satiety scale  Barriers to learning/adherence to lifestyle change: overall health  Demonstrated degree of understanding via:  Teach Back   Monitoring/Evaluation:  Dietary intake, exercise, label reading, and body weight prn

## 2015-05-17 ENCOUNTER — Telehealth: Payer: Self-pay | Admitting: Endocrinology

## 2015-05-17 ENCOUNTER — Telehealth: Payer: Self-pay | Admitting: Internal Medicine

## 2015-05-17 MED ORDER — GLUCOSE BLOOD VI STRP: ORAL_STRIP | Status: DC

## 2015-05-17 NOTE — Telephone Encounter (Signed)
I contacted Wilma and with [facility] and advised to refax the INR request.

## 2015-05-17 NOTE — Telephone Encounter (Signed)
I contacted the pt's wife and advised of note below. She voiced understanding.  

## 2015-05-17 NOTE — Telephone Encounter (Signed)
Please call and talk to bart at [facility] to clarify the shingles issue

## 2015-05-17 NOTE — Telephone Encounter (Signed)
ATC pt but line keeps ringing busy. WCB

## 2015-05-17 NOTE — Telephone Encounter (Signed)
Stop deep suctioning Hold coumadin until bleeding stops then restart

## 2015-05-17 NOTE — Telephone Encounter (Signed)
The suctioning is a question for pulmonary We can give the shingles shot at your next ov here.

## 2015-05-17 NOTE — Telephone Encounter (Signed)
Called spoke with pt spouse. Pt saw PCP (Dr. Everardo AllEllison) on Friday and pt o2 was 70% on 3 liters O2. Pt finally got up to 89%. Pt did CXR (in epic) on 12/2. She has been suctioning bright red blood from tach x Friday every morning In the evenings it is clear.  She reports she called PCP office this AM to report the blood and was told to call us. Spouse requesting recs.  Please advise MW thanks

## 2015-05-17 NOTE — Telephone Encounter (Signed)
Wilma from [facility] called and would like to speak with Caswell CorwinMegan   Cindy from medical records had faxed over a request for INR   Call back: [phone] Wilma   Thank you

## 2015-05-17 NOTE — Telephone Encounter (Signed)
[facility]

## 2015-05-17 NOTE — Telephone Encounter (Signed)
See note below and please advise, Thanks! 

## 2015-05-17 NOTE — Telephone Encounter (Signed)
Has been suctioning out bright red blood all weekend in the AM mostly, turns clear by nighttime.  Also, pt could not get the shingles shot

## 2015-05-18 NOTE — Telephone Encounter (Signed)
Pt's wife is aware of MW's recommendations. Nothing further was needed at this time,

## 2015-05-21 ENCOUNTER — Telehealth: Payer: Self-pay | Admitting: Endocrinology

## 2015-05-21 NOTE — Telephone Encounter (Signed)
please call [facility]: The fax you sent says diclofenac powder, but this what was rx'ed here is nystatin powder. Where did you receive the information about the diclofenac?

## 2015-05-21 NOTE — Telephone Encounter (Signed)
Requested a call back from [place] with [facility] to discuss.

## 2015-05-24 ENCOUNTER — Telehealth: Payer: Self-pay | Admitting: Endocrinology

## 2015-05-24 ENCOUNTER — Other Ambulatory Visit: Payer: Self-pay

## 2015-05-24 NOTE — Telephone Encounter (Signed)
Wilma from [facility] states she was returning Megans phone call    Please advise    Thank you

## 2015-05-24 NOTE — Telephone Encounter (Addendum)
Requested a call back from the [facility] to discuss.

## 2015-05-24 NOTE — Telephone Encounter (Signed)
Requested a call back from the [facility] to discuss. 

## 2015-05-25 DIAGNOSIS — J9622 Acute and chronic respiratory failure with hypercapnia: Secondary | ICD-10-CM | POA: Diagnosis not present

## 2015-05-25 DIAGNOSIS — E119 Type 2 diabetes mellitus without complications: Secondary | ICD-10-CM | POA: Diagnosis not present

## 2015-05-25 DIAGNOSIS — I4891 Unspecified atrial fibrillation: Secondary | ICD-10-CM | POA: Diagnosis not present

## 2015-05-25 DIAGNOSIS — J441 Chronic obstructive pulmonary disease with (acute) exacerbation: Secondary | ICD-10-CM | POA: Diagnosis not present

## 2015-05-26 ENCOUNTER — Other Ambulatory Visit: Payer: Self-pay | Admitting: Endocrinology

## 2015-05-27 NOTE — Telephone Encounter (Signed)
Please advise if ok to refill. Medication is not on pt's current med list. Thanks!

## 2015-05-28 ENCOUNTER — Telehealth: Payer: Self-pay | Admitting: Endocrinology

## 2015-05-28 NOTE — Telephone Encounter (Signed)
ok 

## 2015-05-28 NOTE — Telephone Encounter (Signed)
Physical Therapist advised on verbal ok.

## 2015-05-28 NOTE — Telephone Encounter (Signed)
[facility] and would like an order for a social worker   Call back: [phone] Marjorie SmolderDerrick  Thank you

## 2015-05-28 NOTE — Telephone Encounter (Signed)
See note below and please advise, Thanks! 

## 2015-05-31 ENCOUNTER — Encounter: Payer: Self-pay | Admitting: Endocrinology

## 2015-05-31 ENCOUNTER — Ambulatory Visit (INDEPENDENT_AMBULATORY_CARE_PROVIDER_SITE_OTHER): Payer: Medicare Other | Admitting: Endocrinology

## 2015-05-31 ENCOUNTER — Telehealth: Payer: Self-pay

## 2015-05-31 VITALS — BP 136/82 | HR 69 | Temp 98.2°F | Ht 67.0 in | Wt 298.0 lb

## 2015-05-31 DIAGNOSIS — IMO0002 Reserved for concepts with insufficient information to code with codable children: Secondary | ICD-10-CM

## 2015-05-31 DIAGNOSIS — E1065 Type 1 diabetes mellitus with hyperglycemia: Secondary | ICD-10-CM

## 2015-05-31 DIAGNOSIS — R0600 Dyspnea, unspecified: Secondary | ICD-10-CM | POA: Diagnosis not present

## 2015-05-31 DIAGNOSIS — E1159 Type 2 diabetes mellitus with other circulatory complications: Secondary | ICD-10-CM | POA: Diagnosis not present

## 2015-05-31 DIAGNOSIS — E1040 Type 1 diabetes mellitus with diabetic neuropathy, unspecified: Secondary | ICD-10-CM | POA: Diagnosis not present

## 2015-05-31 LAB — POCT GLYCOSYLATED HEMOGLOBIN (HGB A1C): Hemoglobin A1C: 6.1

## 2015-05-31 MED ORDER — FAMOTIDINE 20 MG PO TABS: 20.0000 mg | ORAL_TABLET | Freq: Two times a day (BID) | ORAL | Status: DC

## 2015-05-31 MED ORDER — INSULIN GLARGINE 100 UNIT/ML SOLOSTAR PEN: 15.0000 [IU] | PEN_INJECTOR | Freq: Every day | SUBCUTANEOUS | Status: DC

## 2015-05-31 NOTE — Patient Instructions (Addendum)
check your blood sugar twice a day.  vary the time of day when you check, between before the 3 meals, and at bedtime.  also check if you have symptoms of your blood sugar being too high or too low.  please keep a record of the readings and bring it to your next appointment here.  You can write it on any piece of paper.  please call us sooner if your blood sugar goes below 70, or if you have a lot of readings over 200.   Please come back for a follow-up appointment in 2 months.  We have to balance the coumadin and bleeding as best we can.   By the same token, please continue to balance the benefits of the pain and sleep medications against the side-effects as best you can.  blood tests are requested for you today.  We'll let you know about the results.   Please reduce the lantus to 15 units at bedtime.

## 2015-05-31 NOTE — Telephone Encounter (Signed)
Please refill prn 

## 2015-05-31 NOTE — Telephone Encounter (Signed)
Rx submitted per pt's request.  

## 2015-05-31 NOTE — Telephone Encounter (Signed)
Received a refill request on the medication famotidine 20 mg. Please advise if ok to refill. Rx is listed under a historical provider. Thanks!

## 2015-05-31 NOTE — Progress Notes (Signed)
Subjective:    Patient ID: Blake Summers, male    DOB: March 27, 1946, 69 y.o.   MRN: [id number]  HPI The state of at least three ongoing medical problems is addressed today, with interval history of each noted here: Pt returns for f/u of diabetes mellitus: DM type: Insulin-requiring type 2 Dx'ed: 1990 Complications: polyneuropathy, CAD, CVA, and foot ulcer Therapy: insulin since 1999 DKA: never Severe hypoglycemia: never Pancreatitis: never Other: he takes multiple daily injections; he was turned down for weight-loss surgery, due to comorbidities Interval history: I asked wife, who brings a record of his cbg's which i have reviewed today.  It varies from 75-200's.  There is no trend throughout the day.   Bleeding from suction tube is less, but recurs intermittently.  He has lost 20 lbs in the past 3 weeks (since lasix was increased).  Past Medical History  Diagnosis Date  . GERD 07/06/2010  . Edema 10/28/2007  . DIABETES MELLITUS, TYPE II 05/04/2007  . HYPERLIPIDEMIA 07/08/2008  . ALLERGIC RHINITIS 07/08/2008  . GLAUCOMA 07/08/2008  . CEREBROVASCULAR ACCIDENT, HX OF 07/08/2008  . OBSTRUCTIVE SLEEP APNEA 03/06/2008  . DIABETIC ULCER, LEFT LEG 08/23/2009  . 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 HYPERTENSION 10/28/2007  . VENOUS INSUFFICIENCY 03/08/2009  . FATTY LIVER DISEASE 05/19/2008  . BENIGN PROSTATIC HYPERTROPHY 07/08/2008  . 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 NEPHROLITHIASIS, HX OF 07/08/2008  . Morbid obesity (HCC)   . DM nephropathy/sclerosis   . 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 Lumbar spondylosis   . Diarrhea 06/08/2013    Past Surgical History  Procedure Laterality Date  . Lithotripsy  2007  . Tracheostomy tube placement N/A 07/14/2014    Procedure: TRACHEOSTOMY;  Surgeon: Darletta MollSui W Teoh, MD;  [facility];  Service: ENT;  Laterality: N/A;    Social History   Social History  . Marital Status: Married    Spouse Name: N/A  . Number of  Children: N/A  . Years of Education: N/A   Occupational History  . Retired     Nature conservation officeroperations manager   Social History Main Topics  . Smoking status: Former Smoker -- 0.50 packs/day for 15 years    Types: Cigarettes    Quit date: 06/12/1988  . Smokeless tobacco: Never Used  . Alcohol Use: No  . Drug Use: No  . Sexual Activity: Not on file   Other Topics Concern  . Not on file   Social History Narrative   Diet is "good"   Exercise is limited by medical problems    Current Outpatient Prescriptions on File Prior to Visit  Medication Sig Dispense Refill  . aspirin 81 MG chewable tablet Chew 1 tablet (81 mg total) by mouth daily.    . brimonidine (ALPHAGAN) 0.15 % ophthalmic solution Place 1 drop into both eyes 2 (two) times daily.     Marland Kitchen. dexlansoprazole (DEXILANT) 60 MG capsule Take 1 capsule (60 mg total) by mouth daily. 30 capsule 6  . diltiazem (CARDIZEM) 60 MG tablet Take 1 tablet (60 mg total) by mouth every 8 (eight) hours. 90 tablet 11  . dorzolamide-timolol (COSOPT) 22.3-6.8 MG/ML ophthalmic solution Place 1 drop into both eyes 2 (two) times daily.     . fluconazole (DIFLUCAN) 150 MG tablet Take 1 tablet (150 mg total) by mouth daily. 10 tablet 0  . furosemide (LASIX) 80 MG tablet Take 1 tablet (80 mg total) by mouth 2 (two) times daily. 60 tablet 5  .  glucose blood (ONETOUCH VERIO) test strip CHECK BLOOD SUGAR THREE TIMES A DAY 100 each 5  . insulin aspart (NOVOLOG FLEXPEN) 100 UNIT/ML FlexPen Inject into the skin 3 (three) times daily with meals. 3 times a day (just before each meal) 20-20-10 units    . ketoconazole (NIZORAL) 2 % shampoo Apply 1 application topically once a week.     . lactulose (CHRONULAC) 10 GM/15ML solution TAKE 45 ML'S (3 TABLESPOONFULS) (30 GM TOTAL) TWO TIMES DAILY 960 mL 11  . latanoprost (XALATAN) 0.005 % ophthalmic solution Place 1 drop into both eyes at bedtime. 2.5 mL 4  . lidocaine (XYLOCAINE) 2 % jelly Apply 1 application topically 3 (three) times  daily. As needed for pain 40 mL 11  . losartan (COZAAR) 50 MG tablet Take 1 tablet (50 mg total) by mouth daily. 30 tablet 11  . morphine (MS CONTIN) 15 MG 12 hr tablet Take 1 tablet (15 mg total) by mouth every 12 (twelve) hours. 60 tablet 0  . nystatin (MYCOSTATIN) powder Apply topically 4 (four) times daily. 120 g 5  . oxyCODONE-acetaminophen (PERCOCET) 7.5-325 MG tablet Take 1 tablet by mouth every 8 (eight) hours as needed (pain). 90 tablet 0  . polyethylene glycol powder (GLYCOLAX/MIRALAX) powder MIX 1 CAPFUL (17 GRAMS) IN 8 TO 10 OUNCES OF LIQUID AND DRINK ONCE DAILY ASDIRECTED 527 g 2  . potassium chloride (K-DUR) 10 MEQ tablet Take 10 mEq by mouth daily.    . pravastatin (PRAVACHOL) 40 MG tablet Take 1 tablet (40 mg total) by mouth daily at 6 PM. 90 tablet 3  . senna (SENOKOT) 8.6 MG TABS tablet Take 1 tablet (8.6 mg total) by mouth daily as needed for mild constipation. 50 each 5  . tamsulosin (FLOMAX) 0.4 MG CAPS capsule Take 1 capsule (0.4 mg total) by mouth daily. 30 capsule 6  . topiramate (TOPAMAX) 50 MG tablet Take 1 tablet (50 mg total) by mouth at bedtime. 30 tablet 2  . warfarin (COUMADIN) 6 MG tablet Take up to 2 tablets daily or take as directed by anticoagulation clinic 60 tablet 3  . zolpidem (AMBIEN) 10 MG tablet Take 1 tablet (10 mg total) by mouth at bedtime as needed for sleep. 30 tablet 5  . zoster vaccine live, PF, (ZOSTAVAX) 40981 UNT/0.65ML injection Inject 19,400 Units into the skin once. 1 each 0   No current facility-administered medications on file prior to visit.    Allergies  Allergen Reactions  . Neosporin [Neomycin-Polymyxin-Gramicidin] Other (See Comments)    unknown    Family History  Problem Relation Age of Onset  . Cancer Brother     Colon Cancer  . Heart disease Brother   . Heart disease Brother   . Asthma Mother     BP 136/82 mmHg  Pulse 69  Temp(Src) 98.2 F (36.8 C) (Oral)  Ht  (1.702 m)  Wt 298 lb (135.172 kg)  BMI 46.66  kg/m2  SpO2 94%  Review of Systems Edema is less now.  No falls.      Objective:   Physical Exam VITAL SIGNS:  See vs page GENERAL: no distress.  In wheelchair.  Has 02 on LUNGS:  Clear to auscultation    A1c=6.1% Lab Results  Component Value Date   CREATININE 1.06 05/31/2015   BUN 18 05/31/2015   NA 140 05/31/2015   K 4.4 05/31/2015   CL 95* 05/31/2015   CO2 39* 05/31/2015   BNP is elevated    Assessment & Plan:  DM: slightly overcontrolled.   CHF: improved.  Please continue the same lasix.   Bleeding from trach suction: recurrent but milder.  I advised pt and wife to ask Dr Sherene Sires about this at f/u ov in a few days.   Chronic pain and insomnia: nocturnal ventilation mitigates the adverse effects of these meds, but we still have to balance QOL vs adverse effects as best we can.     Patient is advised the following: Patient Instructions  check your blood sugar twice a day.  vary the time of day when you check, between before the 3 meals, and at bedtime.  also check if you have symptoms of your blood sugar being too high or too low.  please keep a record of the readings and bring it to your next appointment here.  You can write it on any piece of paper.  please call us sooner if your blood sugar goes below 70, or if you have a lot of readings over 200.   Please come back for a follow-up appointment in 2 months.  We have to balance the coumadin and bleeding as best we can.   By the same token, please continue to balance the benefits of the pain and sleep medications against the side-effects as best you can.  blood tests are requested for you today.  We'll let you know about the results.   Please reduce the lantus to 15 units at bedtime.

## 2015-06-01 LAB — BASIC METABOLIC PANEL
BUN: 18 mg/dL (ref 6–23)
CHLORIDE: 95 meq/L — AB (ref 96–112)
CO2: 39 meq/L — AB (ref 19–32)
CREATININE: 1.06 mg/dL (ref 0.40–1.50)
Calcium: 9.3 mg/dL (ref 8.4–10.5)
GFR: 73.34 mL/min (ref >60.00)
GLUCOSE: 139 mg/dL — AB (ref 70–99)
POTASSIUM: 4.4 meq/L (ref 3.5–5.1)
Sodium: 140 mEq/L (ref 135–145)

## 2015-06-01 LAB — BRAIN NATRIURETIC PEPTIDE: Pro B Natriuretic peptide (BNP): 163 pg/mL — ABNORMAL HIGH (ref 0.0–100.0)

## 2015-06-04 ENCOUNTER — Encounter: Payer: Self-pay | Admitting: Internal Medicine

## 2015-06-04 ENCOUNTER — Ambulatory Visit (INDEPENDENT_AMBULATORY_CARE_PROVIDER_SITE_OTHER): Payer: Medicare Other | Admitting: Internal Medicine

## 2015-06-04 VITALS — BP 112/60 | HR 52 | Ht 66.0 in | Wt 298.0 lb

## 2015-06-04 DIAGNOSIS — J9612 Chronic respiratory failure with hypercapnia: Secondary | ICD-10-CM | POA: Diagnosis not present

## 2015-06-04 NOTE — Patient Instructions (Addendum)
We will ask advanced humidity for your trach collar and the ventilator   Please schedule a follow up visit in 3 months but call sooner if needed

## 2015-06-04 NOTE — Progress Notes (Signed)
Subjective:     Patient ID: Blake Summers, male   DOB: 1945/12/26, .   MRN: [id number]     Brief patient profile:  69 yowm OHS  last doing well in May 2016 sleeping 45 degreees HOB on home vent and 02 and during  Day just 02 3lpm and pmv   History of Present Illness  01/04/2015 1st [facility]   office visit/ [place] Blake Summers   Chief Complaint  Patient presents with  . Follow-up    Former pt of Dr Shelle Iron. Pt c/o increased SOB with exertion- relates to hot weather.   cc worse sob since May 2016  lots of nasal drainage with more suctioning required > yellowish,  occ bloody says due to hot weather but really doesn't go out much and lives in a/c.  Not as comfortable on vent  rec I will have advanced adjust your ventilator if needed  Ok to use the ventilator during the day  For drainage take chlortrimeton (chlorpheniramine) 4 mg every 4 hours available over the counter (may cause drowsiness)  And Pepcid 20 mg at bedime If not better add prilosec 20 mg Take 30-60 min before first meal of the day  GERD diet  Please schedule a follow up visit in 3 months but call sooner if needed  Late add :  ? pna > rx Augmentin 875 mg take one pill twice daily  X 10 days   Admit [facility] dx hypercarbia/ ? narc overuse > placed back on vent and did fine per discharge summary   03/05/2015  f/u ov/Blake Summers re: ohs/vent dep / now on ACEi  Chief Complaint  Patient presents with  . HFU    Pt states admitted to [facility] from 9/[facility]-9/19 with respiratory failure and elevated co2. His breathing has improved since he was discharged home.   Wife thinks the reason he's better is that 02 is now per trach but note at the time he worsened was placed on home vent and still had severe hypercarbia on arrival to [facility] ER.  He has been no obvious change in his ventilator in terms of settings since he was complaining the ventilator was not given him enough breath at his last ov  rec No change in recs for now  - beware of choking sensation from  lisinopril - if this develops it needs to be stopped and a substitute found per Dr Pecola Lawless discretion I recommend you establish with [facility] and see [place] as needed (needs to be seen every 3 months)    06/04/2015  f/u ov/Blake Summers re: OHS/ vent and 02 dep  Chief Complaint  Patient presents with  . Follow-up    Pt states overall his breathing is "pretty good". Spouse has noticed occ blood when she suctions his trach.   does not have any humidity and secretions are quite dry but sleeping well on vent s am or daytime HA/ no hypersomnolence   No obvious patterns in  day to day or daytime variability or assoc   cp or chest tightness, subjective wheeze or overt sinus or hb symptoms. No unusual exp hx or h/o childhood pna/ asthma or knowledge of premature birth.  Sleeping ok without nocturnal  or early am exacerbation  of respiratory  c/o's or need for noct saba. Also denies any obvious fluctuation of symptoms with weather or environmental changes or other aggravating or alleviating factors except as outlined above   Current Medications, Allergies, Complete Past Medical History, Past Surgical History, Family History, and  Social History were reviewed in [facility] electronic medical record.  ROS  The following are not active complaints unless bolded sore throat, dysphagia, dental problems, itching, sneezing,  nasal congestion or excess/ purulent secretions, ear ache,   fever, chills, sweats, unintended wt loss, classically pleuritic or exertional cp, hemoptysis,  orthopnea pnd or leg swelling, presyncope, palpitations, abdominal pain, anorexia, nausea, vomiting, diarrhea  or change in bowel or bladder habits, change in stools or urine, dysuria,hematuria,  rash, arthralgias, visual complaints, headache, numbness, weakness or ataxia or problems with walking or coordination,  change in mood/affect or memory.            Objective:   Physical Exam   Obese  W/c bound Wm nad  03/05/2015        304  >  06/04/2015   298     01/04/15 322 lb (146.058 kg)  12/08/14 310 lb (140.615 kg)  11/12/14 302 lb (136.986 kg)    Vital signs reviewed     HEENT: nl dentition, turbinates, and orophanx. Nl external ear canals without cough reflex   NECK :  without JVD/Nodes/TM/ nl carotid upstrokes bilaterally/ trach with pmv in place    LUNGS: no acc muscle use, clear to A and P bilaterally without cough on insp or exp maneuvers   CV:  RRR  no s3 or murmur or increase in P2, no edema   ABD:  soft and nontender with nl excursion in the supine position. No bruits or organomegaly, bowel sounds nl  MS:  warm without deformities, calf tenderness, cyanosis or clubbing  SKIN: warm and dry without lesions    NEURO:  alert, approp, no deficits       Recent Labs Lab 05/31/15 1653  NA 140  K 4.4  CL 95*  CO2 39*  BUN 18  CREATININE 1.06  GLUCOSE 139*      Lab Results  Component Value Date   TSH 0.98 01/04/2015     Lab Results  Component Value Date   PROBNP 163.0* 05/31/2015                     Assessment:

## 2015-06-07 ENCOUNTER — Encounter: Payer: Self-pay | Admitting: Internal Medicine

## 2015-06-07 NOTE — Assessment & Plan Note (Signed)
Prior Vent settings: S9 VPAP 14/10 [facility] Admit 06/2014 with worsening RF, required trach and nocturnal vent, required [facility] until 10/08/14. 3 liters pulsed exertion and rest, 3 liters with vpap at bedtime. Per last entry 01/2014 but since then was admitted/ dc from [facility]  - Trach  per Suszanne Connerseoh 07/14/14  - HC03 35  On  01/04/15 despite noct vent  On nocturnal vent as of 01/05/15 : [id number] Settings SIMV/VC, Rate=6, PEEP=+5, PSV=12, VT=500, with 3LPM O2 - HC03  05/31/15 = 39    I had an extended discussion with the patient reviewing all relevant studies completed to date and  lasting 15 to 20 minutes of a 25 minute visit on the following ongoing concerns:   1) despite severity of disease and aggressive increase in baseline bicarbonate level which would indicate chronic hypercarbic respiratory failure even on the ventilatorat night, he leads a pretty good quality of life with good daytime function in terms of alertness and lackof a headache.   2)  therefore I would not treat the numbers by placing on the ventilator more often but only if he develops  daytime hypersomnolence or respiratory symptoms  3) I specifically instructed his wife on the above measures - she seems to have really good insight into his care and they should do fine  4) needs all 02 to be humidified   5) Each maintenance medication was reviewed in detail including most importantly the difference between maintenance and as needed and under what circumstances the prns are to be used.  Please see instructions for details which were reviewed in writing and the patient given a copy.  .Marland Kitchen

## 2015-06-07 NOTE — Assessment & Plan Note (Signed)
Body mass index is 48.12 trending up   Lab Results  Component Value Date   TSH 0.98 01/04/2015     Contributing to OHS /reviewed the need and the process to achieve and maintain neg calorie balance > defer f/u primary care including intermittently monitoring thyroid status

## 2015-06-16 ENCOUNTER — Ambulatory Visit (INDEPENDENT_AMBULATORY_CARE_PROVIDER_SITE_OTHER): Payer: Medicare Other | Admitting: General Practice

## 2015-06-16 DIAGNOSIS — Z8679 Personal history of other diseases of the circulatory system: Secondary | ICD-10-CM | POA: Diagnosis not present

## 2015-06-16 DIAGNOSIS — I4891 Unspecified atrial fibrillation: Secondary | ICD-10-CM | POA: Diagnosis not present

## 2015-06-16 DIAGNOSIS — Z5181 Encounter for therapeutic drug level monitoring: Secondary | ICD-10-CM | POA: Diagnosis not present

## 2015-06-16 LAB — POCT INR: INR: 2.1

## 2015-06-16 NOTE — Progress Notes (Signed)
I have reviewed and agree with the plan. 

## 2015-06-16 NOTE — Progress Notes (Signed)
Pre visit review using our clinic review tool, if applicable. No additional management support is needed unless otherwise documented below in the visit note. 

## 2015-06-28 ENCOUNTER — Encounter: Payer: Self-pay | Admitting: Registered Nurse

## 2015-06-28 ENCOUNTER — Encounter: Payer: Medicare Other | Attending: Registered Nurse | Admitting: Registered Nurse

## 2015-06-28 VITALS — BP 127/86 | HR 68

## 2015-06-28 DIAGNOSIS — Z79899 Other long term (current) drug therapy: Secondary | ICD-10-CM

## 2015-06-28 DIAGNOSIS — J9620 Acute and chronic respiratory failure, unspecified whether with hypoxia or hypercapnia: Secondary | ICD-10-CM | POA: Insufficient documentation

## 2015-06-28 DIAGNOSIS — Z930 Tracheostomy status: Secondary | ICD-10-CM | POA: Insufficient documentation

## 2015-06-28 DIAGNOSIS — Z760 Encounter for issue of repeat prescription: Secondary | ICD-10-CM | POA: Diagnosis not present

## 2015-06-28 DIAGNOSIS — G8929 Other chronic pain: Secondary | ICD-10-CM | POA: Diagnosis not present

## 2015-06-28 DIAGNOSIS — M545 Low back pain, unspecified: Secondary | ICD-10-CM

## 2015-06-28 DIAGNOSIS — K5909 Other constipation: Secondary | ICD-10-CM | POA: Insufficient documentation

## 2015-06-28 DIAGNOSIS — M1711 Unilateral primary osteoarthritis, right knee: Secondary | ICD-10-CM | POA: Diagnosis not present

## 2015-06-28 DIAGNOSIS — T40605A Adverse effect of unspecified narcotics, initial encounter: Secondary | ICD-10-CM | POA: Diagnosis not present

## 2015-06-28 DIAGNOSIS — M25561 Pain in right knee: Secondary | ICD-10-CM | POA: Insufficient documentation

## 2015-06-28 DIAGNOSIS — Z5181 Encounter for therapeutic drug level monitoring: Secondary | ICD-10-CM | POA: Diagnosis not present

## 2015-06-28 DIAGNOSIS — M1712 Unilateral primary osteoarthritis, left knee: Secondary | ICD-10-CM

## 2015-06-28 DIAGNOSIS — M179 Osteoarthritis of knee, unspecified: Secondary | ICD-10-CM | POA: Diagnosis not present

## 2015-06-28 MED ORDER — OXYCODONE-ACETAMINOPHEN 7.5-325 MG PO TABS: 1.0000 | ORAL_TABLET | Freq: Three times a day (TID) | ORAL | Status: DC | PRN

## 2015-06-28 MED ORDER — MORPHINE SULFATE ER 15 MG PO TBCR: 15.0000 mg | EXTENDED_RELEASE_TABLET | Freq: Two times a day (BID) | ORAL | Status: DC

## 2015-06-28 NOTE — Progress Notes (Signed)
Subjective:    Patient ID: Blake Summers, male    DOB: [birth year], 70 y.o.   MRN: [id number]  HPI: Mr. [name] is a 70 year old male who returns for follow up for chronic pain and medication refill. He says his pain is located in his lower back and right knee. He rates his pain 9. His current exercise regime is performing stretching exercises and walking with walker short distances in the home. He arrived to clinic in his motorized wheelchair. Has bilateral velcro wraps to lower extremities.  Pain Inventory Average Pain 9 Pain Right Now 9 My pain is sharp, dull, stabbing and aching  In the last 24 hours, has pain interfered with the following? General activity 7 Relation with others 8 Enjoyment of life 9 What TIME of day is your pain at its worst? daytime Sleep (in general) Poor  Pain is worse with: walking and standing Pain improves with: rest, heat/ice, medication and injections Relief from Meds: 7  Mobility walk with assistance use a cane use a walker ability to climb steps?  no do you drive?  yes use a wheelchair needs help with transfers  Function retired I need assistance with the following:  dressing, bathing, toileting, meal prep, household duties and shopping  Neuro/Psych weakness tingling dizziness  Prior Studies Any changes since last visit?  no  Physicians involved in your care Any changes since last visit?  no   Family History  Problem Relation Age of Onset  . Cancer Brother     Colon Cancer  . Heart disease Brother   . Heart disease Brother   . Asthma Mother    Social History   Social History  . Marital Status: Married    Spouse Name: N/A  . Number of Children: N/A  . Years of Education: N/A   Occupational History  . Retired     Nature conservation officer   Social History Main Topics  . Smoking status: Former Smoker -- 0.50 packs/day for 15 years    Types: Cigarettes    Quit date: 06/12/1988  . Smokeless tobacco: Never Used  .  Alcohol Use: No  . Drug Use: No  . Sexual Activity: Not Asked   Other Topics Concern  . None   Social History Narrative   Diet is "good"   Exercise is limited by medical problems   Past Surgical History  Procedure Laterality Date  . Lithotripsy  2007  . Tracheostomy tube placement N/A 07/14/2014    Procedure: TRACHEOSTOMY;  Surgeon: Darletta Moll, MD;  Location: [facility] OR;  Service: ENT;  Laterality: N/A;   Past Medical History  Diagnosis Date  . GERD 07/06/2010  . Edema 10/28/2007  . DIABETES MELLITUS, TYPE II 05/04/2007  . HYPERLIPIDEMIA 07/08/2008  . ALLERGIC RHINITIS 07/08/2008  . GLAUCOMA 07/08/2008  . CEREBROVASCULAR ACCIDENT, HX OF 07/08/2008  . OBSTRUCTIVE SLEEP APNEA 03/06/2008  . DIABETIC ULCER, LEFT LEG 08/23/2009  . 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 HYPERTENSION 10/28/2007  . VENOUS INSUFFICIENCY 03/08/2009  . FATTY LIVER DISEASE 05/19/2008  . BENIGN PROSTATIC HYPERTROPHY 07/08/2008  . 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 NEPHROLITHIASIS, HX OF 07/08/2008  . Morbid obesity (HCC)   . DM nephropathy/sclerosis   . 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 Lumbar spondylosis   . Diarrhea 06/08/2013   BP 127/86 mmHg  Pulse 58  SpO2 98%  Opioid Risk Score:   Fall Risk Score:  `1  Depression screen PHQ  2/9  Depression screen PHQ 2/9 06/28/2015 02/16/2015 02/04/2015 12/29/2014 12/08/2014 10/20/2014  Decreased Interest 0 0 0 0 0 0  Down, Depressed, Hopeless 0 0 0 0 0 0  PHQ - 2 Score 0 0 0 0 0 0  Altered sleeping - - - - - 0  Tired, decreased energy - - - - - 2  Change in appetite - - - - - 0  Feeling bad or failure about yourself  - - - - - 0  Trouble concentrating - - - - - 0  Moving slowly or fidgety/restless - - - - - 0  Suicidal thoughts - - - - - 0  PHQ-9 Score - - - - - 2     Review of Systems  All other systems reviewed and are negative.      Objective:   Physical Exam  Constitutional: He is oriented to person, place, and time. He appears  well-developed and well-nourished.  HENT:  Head: Normocephalic and atraumatic.  Neck: Normal range of motion. Neck supple.  Cardiovascular: Normal rate and regular rhythm.   Pulmonary/Chest: Effort normal and breath sounds normal.  Continuous Oxygen 3 Liters via PMV/Trach  Musculoskeletal:  Normal Muscle Bulk and Muscle Testing Reveals: Upper Extremities: Full ROM and Muscle Strength 5/5 Back without spinal or paraspinal tenderness Lower Extremities: Full ROM and Muscle Strength 5/5 Arrived in Motorized wheelchair  Neurological: He is alert and oriented to person, place, and time.  Skin: Skin is warm and dry.  Psychiatric: He has a normal mood and affect.  Nursing note and vitals reviewed.         Assessment & Plan:  1. End-stage right knee osteoarthritis: Continue to use Lidocaine gel . Refilled: MS contin 15 mg #60. One tablet every 12 hours and Percocet 7.5/325 mg # 90 pills--use every 8 hours as needed for pain #90. Second script's for the following month. 2. Morbid obesity with generalized deconditioning:  Continue to monitor 3. Chronic low back pain/spondylosis: Continue MS contin with prn percocet.  5. Narcotic induced constipation: Continue with Bowel Program: Senna, Colace, Miralax as prescribed. 6. Acute on Chronic Respiratory Failure: Continuous Oxygen: S/P Tracheostomy/ PMV/ Vent at HS.  20 minutes of face to face patient care time was spent during this visit. All questions were encouraged and answered.   F/U in 2 month

## 2015-07-03 LAB — TOXASSURE SELECT,+ANTIDEPR,UR: PDF: 0

## 2015-07-03 LAB — 6-ACETYLMORPHINE,TOXASSURE ADD
6-ACETYLMORPHINE: NEGATIVE
6-acetylmorphine: NOT DETECTED ng/mg creat

## 2015-07-05 NOTE — Progress Notes (Signed)
Urine drug screen for this encounter is consistent for prescribed medication 

## 2015-07-16 ENCOUNTER — Other Ambulatory Visit: Payer: Self-pay | Admitting: Physical Medicine & Rehabilitation

## 2015-07-16 NOTE — Telephone Encounter (Signed)
Please advise on refill? In September, you stated for the pt to take 50 mg QHS, but this is requesting BID.

## 2015-07-21 ENCOUNTER — Telehealth: Payer: Self-pay | Admitting: Endocrinology

## 2015-07-21 NOTE — Telephone Encounter (Signed)
Please verify novolog is 20 units 3 times a day (just before each meal) Then please increase lunch to 30 units

## 2015-07-21 NOTE — Telephone Encounter (Signed)
Consistently every day mid afternoon is consistently over 200 the rest of the time is fine, please advise

## 2015-07-21 NOTE — Telephone Encounter (Signed)
See note below and please advise, Thanks! 

## 2015-07-21 NOTE — Telephone Encounter (Signed)
Pt's wife notified of note below and voiced understanding.

## 2015-07-30 ENCOUNTER — Ambulatory Visit: Payer: Medicare Other

## 2015-07-30 ENCOUNTER — Ambulatory Visit (INDEPENDENT_AMBULATORY_CARE_PROVIDER_SITE_OTHER): Payer: Medicare Other | Admitting: General Practice

## 2015-07-30 DIAGNOSIS — Z8679 Personal history of other diseases of the circulatory system: Secondary | ICD-10-CM

## 2015-07-30 DIAGNOSIS — Z5181 Encounter for therapeutic drug level monitoring: Secondary | ICD-10-CM | POA: Diagnosis not present

## 2015-07-30 DIAGNOSIS — I4891 Unspecified atrial fibrillation: Secondary | ICD-10-CM | POA: Diagnosis not present

## 2015-07-30 LAB — POCT INR: INR: 2.5

## 2015-07-30 NOTE — Progress Notes (Signed)
I have reviewed and agree with the plan. 

## 2015-07-30 NOTE — Progress Notes (Signed)
Pre visit review using our clinic review tool, if applicable. No additional management support is needed unless otherwise documented below in the visit note. 

## 2015-08-02 ENCOUNTER — Other Ambulatory Visit: Payer: Self-pay

## 2015-08-03 NOTE — Telephone Encounter (Signed)
Please ask PCP to fill

## 2015-08-04 ENCOUNTER — Telehealth: Payer: Self-pay | Admitting: Endocrinology

## 2015-08-04 MED ORDER — TAMSULOSIN HCL 0.4 MG PO CAPS: 0.4000 mg | ORAL_CAPSULE | Freq: Every day | ORAL | Status: DC

## 2015-08-04 NOTE — Telephone Encounter (Signed)
Rx submitted per pt's request.  

## 2015-08-04 NOTE — Telephone Encounter (Signed)
Patient's care giver called requesting a refill on his Rx   Rx: Flomax  Pharmacy: [facility]    Thank you

## 2015-08-07 ENCOUNTER — Other Ambulatory Visit: Payer: Self-pay | Admitting: Endocrinology

## 2015-08-11 ENCOUNTER — Other Ambulatory Visit: Payer: Self-pay | Admitting: General Practice

## 2015-08-11 ENCOUNTER — Telehealth: Payer: Self-pay | Admitting: Internal Medicine

## 2015-08-11 ENCOUNTER — Telehealth: Payer: Self-pay | Admitting: General Practice

## 2015-08-11 DIAGNOSIS — E1040 Type 1 diabetes mellitus with diabetic neuropathy, unspecified: Secondary | ICD-10-CM

## 2015-08-11 DIAGNOSIS — E1065 Type 1 diabetes mellitus with hyperglycemia: Principal | ICD-10-CM

## 2015-08-11 DIAGNOSIS — IMO0002 Reserved for concepts with insufficient information to code with codable children: Secondary | ICD-10-CM

## 2015-08-11 DIAGNOSIS — M109 Gout, unspecified: Secondary | ICD-10-CM

## 2015-08-11 MED ORDER — DEXLANSOPRAZOLE 60 MG PO CPDR: 1.0000 | DELAYED_RELEASE_CAPSULE | Freq: Every day | ORAL | Status: DC

## 2015-08-11 MED ORDER — WARFARIN SODIUM 6 MG PO TABS: ORAL_TABLET | ORAL | Status: DC

## 2015-08-11 NOTE — Telephone Encounter (Signed)
Spoke with pt. He needs a refill on Dexilant. Rx has been sent in. Nothing further was needed.

## 2015-08-11 NOTE — Telephone Encounter (Signed)
Patient's wife called in to request that coumadin be sent in to [facility] (on file). States that he will be out today.

## 2015-08-12 ENCOUNTER — Other Ambulatory Visit: Payer: Self-pay

## 2015-08-12 MED ORDER — LOSARTAN POTASSIUM 50 MG PO TABS: 50.0000 mg | ORAL_TABLET | Freq: Every day | ORAL | Status: DC

## 2015-08-13 ENCOUNTER — Ambulatory Visit (INDEPENDENT_AMBULATORY_CARE_PROVIDER_SITE_OTHER): Payer: Medicare Other | Admitting: Endocrinology

## 2015-08-13 ENCOUNTER — Encounter: Payer: Self-pay | Admitting: Endocrinology

## 2015-08-13 VITALS — BP 122/84 | HR 64 | Temp 99.1°F | Ht 66.0 in

## 2015-08-13 DIAGNOSIS — Z794 Long term (current) use of insulin: Secondary | ICD-10-CM

## 2015-08-13 DIAGNOSIS — E1065 Type 1 diabetes mellitus with hyperglycemia: Secondary | ICD-10-CM | POA: Diagnosis not present

## 2015-08-13 DIAGNOSIS — K649 Unspecified hemorrhoids: Secondary | ICD-10-CM | POA: Diagnosis not present

## 2015-08-13 DIAGNOSIS — E1040 Type 1 diabetes mellitus with diabetic neuropathy, unspecified: Secondary | ICD-10-CM

## 2015-08-13 DIAGNOSIS — IMO0002 Reserved for concepts with insufficient information to code with codable children: Secondary | ICD-10-CM

## 2015-08-13 DIAGNOSIS — L97509 Non-pressure chronic ulcer of other part of unspecified foot with unspecified severity: Secondary | ICD-10-CM

## 2015-08-13 DIAGNOSIS — E11621 Type 2 diabetes mellitus with foot ulcer: Secondary | ICD-10-CM

## 2015-08-13 LAB — POCT GLYCOSYLATED HEMOGLOBIN (HGB A1C): HEMOGLOBIN A1C: 6.1

## 2015-08-13 NOTE — Progress Notes (Signed)
Subjective:    Patient ID: Blake Summers, male    DOB: [birth year], 70 y.o.   MRN: [id number]  HPI Pt returns for f/u of diabetes mellitus: DM type: Insulin-requiring type 2 Dx'ed: 1990 Complications: polyneuropathy, CAD, CVA, and foot ulcer.   Therapy: insulin since 1999 DKA: never Severe hypoglycemia: never Pancreatitis: never Other: he takes multiple daily injections; he was turned down for weight-loss surgery, due to comorbidities Interval history: I asked wife, who brings a record of his cbg's which i have reviewed today.  It varies from 65-300's.  It is lowest in the afternoon and HS.   Pt states slight bleeding from the rectum, and assoc pain.  Past Medical History  Diagnosis Date  . GERD 07/06/2010  . Edema 10/28/2007  . DIABETES MELLITUS, TYPE II 05/04/2007  . HYPERLIPIDEMIA 07/08/2008  . ALLERGIC RHINITIS 07/08/2008  . GLAUCOMA 07/08/2008  . CEREBROVASCULAR ACCIDENT, HX OF 07/08/2008  . OBSTRUCTIVE SLEEP APNEA 03/06/2008  . DIABETIC ULCER, LEFT LEG 08/23/2009  . 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 HYPERTENSION 10/28/2007  . VENOUS INSUFFICIENCY 03/08/2009  . FATTY LIVER DISEASE 05/19/2008  . BENIGN PROSTATIC HYPERTROPHY 07/08/2008  . 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 NEPHROLITHIASIS, HX OF 07/08/2008  . Morbid obesity (HCC)   . DM nephropathy/sclerosis   . 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 Lumbar spondylosis   . Diarrhea 06/08/2013    Past Surgical History  Procedure Laterality Date  . Lithotripsy  2007  . Tracheostomy tube placement N/A 07/14/2014    Procedure: TRACHEOSTOMY;  Surgeon: Darletta Moll, MD;  Location: [facility] OR;  Service: ENT;  Laterality: N/A;    Social History   Social History  . Marital Status: Married    Spouse Name: N/A  . Number of Children: N/A  . Years of Education: N/A   Occupational History  . Retired     Nature conservation officer   Social History Main Topics  . Smoking status: Former Smoker -- 0.50 packs/day for 15  years    Types: Cigarettes    Quit date: 06/12/1988  . Smokeless tobacco: Never Used  . Alcohol Use: No  . Drug Use: No  . Sexual Activity: Not on file   Other Topics Concern  . Not on file   Social History Narrative   Diet is "good"   Exercise is limited by medical problems    Current Outpatient Prescriptions on File Prior to Visit  Medication Sig Dispense Refill  . aspirin 81 MG chewable tablet Chew 1 tablet (81 mg total) by mouth daily.    . brimonidine (ALPHAGAN) 0.15 % ophthalmic solution Place 1 drop into both eyes 2 (two) times daily.     Marland Kitchen dexlansoprazole (DEXILANT) 60 MG capsule Take 1 capsule (60 mg total) by mouth daily. 30 capsule 5  . diltiazem (CARDIZEM) 60 MG tablet Take 1 tablet (60 mg total) by mouth every 8 (eight) hours. 90 tablet 11  . dorzolamide-timolol (COSOPT) 22.3-6.8 MG/ML ophthalmic solution Place 1 drop into both eyes 2 (two) times daily.     . famotidine (PEPCID) 20 MG tablet Take 1 tablet (20 mg total) by mouth 2 (two) times daily. 60 tablet 3  . fluconazole (DIFLUCAN) 150 MG tablet Take 1 tablet by mouth. As directed    . furosemide (LASIX) 80 MG tablet Take 1 tablet (80 mg total) by mouth 2 (two) times daily. 60 tablet 5  . glucose blood (ONETOUCH VERIO) test strip CHECK BLOOD SUGAR THREE  TIMES A DAY 100 each 5  . insulin aspart (NOVOLOG FLEXPEN) 100 UNIT/ML FlexPen Inject into the skin 3 (three) times daily with meals. 3 times a day (just before each meal) 20-25-15 units    . Insulin Glargine (LANTUS SOLOSTAR) 100 UNIT/ML Solostar Pen Inject 15 Units into the skin at bedtime. 15 mL 2  . ketoconazole (NIZORAL) 2 % shampoo Apply 1 application topically once a week.     . lactulose (CHRONULAC) 10 GM/15ML solution TAKE 45 ML'S (3 TABLESPOONFULS) (30 GM TOTAL) TWO TIMES DAILY 960 mL 11  . latanoprost (XALATAN) 0.005 % ophthalmic solution Place 1 drop into both eyes at bedtime. 2.5 mL 4  . lidocaine (XYLOCAINE) 2 % jelly Apply 1 application topically 3  (three) times daily. As needed for pain 40 mL 11  . losartan (COZAAR) 50 MG tablet Take 1 tablet (50 mg total) by mouth daily. 30 tablet 11  . morphine (MS CONTIN) 15 MG 12 hr tablet Take 1 tablet (15 mg total) by mouth every 12 (twelve) hours. 60 tablet 0  . nystatin (MYCOSTATIN) powder Apply topically 4 (four) times daily. 120 g 5  . oxyCODONE-acetaminophen (PERCOCET) 7.5-325 MG tablet Take 1 tablet by mouth every 8 (eight) hours as needed (pain). 90 tablet 0  . OXYGEN Oxygen 3-4 lpm  Vent with sleep    . polyethylene glycol powder (GLYCOLAX/MIRALAX) powder MIX 17 GRAMS IN 8 TO 10 OUNCES OF LIQUIDAND DRINK EVERY DAY AS PHYSICIAN INSTRUCTED 527 g 2  . pravastatin (PRAVACHOL) 40 MG tablet Take 1 tablet (40 mg total) by mouth daily at 6 PM. 90 tablet 3  . senna (SENOKOT) 8.6 MG TABS tablet Take 1 tablet (8.6 mg total) by mouth daily as needed for mild constipation. 50 each 5  . tamsulosin (FLOMAX) 0.4 MG CAPS capsule Take 1 capsule (0.4 mg total) by mouth daily. 30 capsule 6  . topiramate (TOPAMAX) 50 MG tablet Take 1 tablet (50 mg total) by mouth at bedtime. 30 tablet 2  . topiramate (TOPAMAX) 50 MG tablet TAKE 1 TABLET BY MOUTH 2 TIMES A DAY 60 tablet 5  . torsemide (DEMADEX) 20 MG tablet Take 1 tablet by mouth daily.    Marland Kitchen warfarin (COUMADIN) 6 MG tablet Take up to 2 tablets daily or take as directed by anticoagulation clinic 60 tablet 3  . zolpidem (AMBIEN) 10 MG tablet Take 1 tablet (10 mg total) by mouth at bedtime as needed for sleep. 30 tablet 5   No current facility-administered medications on file prior to visit.    Allergies  Allergen Reactions  . Neosporin [Neomycin-Polymyxin-Gramicidin] Other (See Comments)    unknown    Family History  Problem Relation Age of Onset  . Cancer Brother     Colon Cancer  . Heart disease Brother   . Heart disease Brother   . Asthma Mother     BP 122/84 mmHg  Pulse 64  Temp(Src) 99.1 F (37.3 C) (Oral)  Ht  (1.676 m)  SpO2  95%  Review of Systems No weight change.  Insomnia persists.    Objective:   Physical Exam VITAL SIGNS:  See vs page GENERAL: no distress.  Morbid obesity.  In wheelchair.  Has 02 on.   Rectal: normal except for 1 small hemorrhoid.  No blood seen.     A1c=6.1%    Assessment & Plan:  DM: overcontrolled. BRBPR, new.  He is too ill to benefit from colonoscopy.  Insomnia: persistent, but he is on nocturnal  ventilation.   Patient is advised the following: Patient Instructions  check your blood sugar twice a day.  vary the time of day when you check, between before the 3 meals, and at bedtime.  also check if you have symptoms of your blood sugar being too high or too low.  please keep a record of the readings and bring it to your next appointment here.  You can write it on any piece of paper.  please call us sooner if your blood sugar goes below 70, or if you have a lot of readings over 200.   Please come back for a follow-up appointment in 2 months.  Please reduce the novolog to 3 times a day (just before each meal) 20-25-15 units. Put hydrocortisone cream on the rectal area, to help the pain.   miralax also helps keep things moving, and therefore helps the rectal pain.   We have to balance the coumadin and bleeding as best we can.   By the same token, please continue to balance the benefits of the pain and sleep medications against the side-effects as best you can.  Please continue the same lantus.

## 2015-08-13 NOTE — Patient Instructions (Addendum)
check your blood sugar twice a day.  vary the time of day when you check, between before the 3 meals, and at bedtime.  also check if you have symptoms of your blood sugar being too high or too low.  please keep a record of the readings and bring it to your next appointment here.  You can write it on any piece of paper.  please call us sooner if your blood sugar goes below 70, or if you have a lot of readings over 200.   Please come back for a follow-up appointment in 2 months.  Please reduce the novolog to 3 times a day (just before each meal) 20-25-15 units. Put hydrocortisone cream on the rectal area, to help the pain.   miralax also helps keep things moving, and therefore helps the rectal pain.   We have to balance the coumadin and bleeding as best we can.   By the same token, please continue to balance the benefits of the pain and sleep medications against the side-effects as best you can.  Please continue the same lantus.

## 2015-08-14 DIAGNOSIS — E119 Type 2 diabetes mellitus without complications: Secondary | ICD-10-CM | POA: Insufficient documentation

## 2015-08-30 ENCOUNTER — Other Ambulatory Visit: Payer: Self-pay | Admitting: Endocrinology

## 2015-09-01 ENCOUNTER — Encounter: Payer: Medicare Other | Admitting: Physical Medicine & Rehabilitation

## 2015-09-06 ENCOUNTER — Encounter: Payer: Self-pay | Admitting: Registered Nurse

## 2015-09-06 ENCOUNTER — Encounter: Payer: Self-pay | Admitting: Internal Medicine

## 2015-09-06 ENCOUNTER — Ambulatory Visit (INDEPENDENT_AMBULATORY_CARE_PROVIDER_SITE_OTHER): Payer: Medicare Other | Admitting: Internal Medicine

## 2015-09-06 ENCOUNTER — Encounter: Payer: Medicare Other | Attending: Registered Nurse | Admitting: Registered Nurse

## 2015-09-06 ENCOUNTER — Ambulatory Visit (INDEPENDENT_AMBULATORY_CARE_PROVIDER_SITE_OTHER): Payer: Medicare Other | Admitting: *Deleted

## 2015-09-06 VITALS — BP 122/60 | HR 63 | Ht 66.0 in | Wt 303.8 lb

## 2015-09-06 VITALS — BP 138/61 | HR 61 | Resp 14

## 2015-09-06 DIAGNOSIS — Z760 Encounter for issue of repeat prescription: Secondary | ICD-10-CM | POA: Insufficient documentation

## 2015-09-06 DIAGNOSIS — K5909 Other constipation: Secondary | ICD-10-CM | POA: Diagnosis not present

## 2015-09-06 DIAGNOSIS — M25561 Pain in right knee: Secondary | ICD-10-CM | POA: Insufficient documentation

## 2015-09-06 DIAGNOSIS — G8929 Other chronic pain: Secondary | ICD-10-CM | POA: Diagnosis not present

## 2015-09-06 DIAGNOSIS — I10 Essential (primary) hypertension: Secondary | ICD-10-CM

## 2015-09-06 DIAGNOSIS — M545 Low back pain, unspecified: Secondary | ICD-10-CM

## 2015-09-06 DIAGNOSIS — Z5181 Encounter for therapeutic drug level monitoring: Secondary | ICD-10-CM

## 2015-09-06 DIAGNOSIS — M179 Osteoarthritis of knee, unspecified: Secondary | ICD-10-CM | POA: Diagnosis not present

## 2015-09-06 DIAGNOSIS — M1711 Unilateral primary osteoarthritis, right knee: Secondary | ICD-10-CM | POA: Diagnosis not present

## 2015-09-06 DIAGNOSIS — I4891 Unspecified atrial fibrillation: Secondary | ICD-10-CM | POA: Diagnosis not present

## 2015-09-06 DIAGNOSIS — Z930 Tracheostomy status: Secondary | ICD-10-CM | POA: Diagnosis not present

## 2015-09-06 DIAGNOSIS — Z79899 Other long term (current) drug therapy: Secondary | ICD-10-CM

## 2015-09-06 DIAGNOSIS — J9620 Acute and chronic respiratory failure, unspecified whether with hypoxia or hypercapnia: Secondary | ICD-10-CM | POA: Insufficient documentation

## 2015-09-06 DIAGNOSIS — M1712 Unilateral primary osteoarthritis, left knee: Secondary | ICD-10-CM

## 2015-09-06 DIAGNOSIS — Z8679 Personal history of other diseases of the circulatory system: Secondary | ICD-10-CM

## 2015-09-06 DIAGNOSIS — T40605A Adverse effect of unspecified narcotics, initial encounter: Secondary | ICD-10-CM | POA: Diagnosis not present

## 2015-09-06 LAB — BASIC METABOLIC PANEL
BUN: 19 mg/dL (ref 7–25)
CHLORIDE: 100 mmol/L (ref 98–110)
CO2: 32 mmol/L — AB (ref 20–31)
Calcium: 8.7 mg/dL (ref 8.6–10.3)
Creat: 0.91 mg/dL (ref 0.70–1.18)
Glucose, Bld: 226 mg/dL — ABNORMAL HIGH (ref 65–99)
POTASSIUM: 4 mmol/L (ref 3.5–5.3)
Sodium: 141 mmol/L (ref 135–146)

## 2015-09-06 LAB — CBC
HEMATOCRIT: 34.2 % — AB (ref 39.0–52.0)
HEMOGLOBIN: 10.7 g/dL — AB (ref 13.0–17.0)
MCH: 25.1 pg — AB (ref 26.0–34.0)
MCHC: 31.3 g/dL (ref 30.0–36.0)
MCV: 80.3 fL (ref 78.0–100.0)
MPV: 9.8 fL (ref 8.6–12.4)
Platelets: 259 10*3/uL (ref 150–400)
RBC: 4.26 MIL/uL (ref 4.22–5.81)
RDW: 15.8 % — ABNORMAL HIGH (ref 11.5–15.5)
WBC: 9.5 10*3/uL (ref 4.0–10.5)

## 2015-09-06 LAB — POCT INR: INR: 1.6

## 2015-09-06 MED ORDER — MORPHINE SULFATE ER 15 MG PO TBCR
15.0000 mg | EXTENDED_RELEASE_TABLET | Freq: Two times a day (BID) | ORAL | Status: DC
Start: 2015-09-06 — End: 2015-11-01

## 2015-09-06 MED ORDER — OXYCODONE-ACETAMINOPHEN 7.5-325 MG PO TABS
1.0000 | ORAL_TABLET | Freq: Three times a day (TID) | ORAL | Status: DC | PRN
Start: 2015-09-06 — End: 2015-09-06

## 2015-09-06 MED ORDER — OXYCODONE-ACETAMINOPHEN 7.5-325 MG PO TABS
1.0000 | ORAL_TABLET | Freq: Three times a day (TID) | ORAL | Status: DC | PRN
Start: 2015-09-06 — End: 2015-11-01

## 2015-09-06 MED ORDER — MORPHINE SULFATE ER 15 MG PO TBCR: 15.0000 mg | EXTENDED_RELEASE_TABLET | Freq: Two times a day (BID) | ORAL | Status: DC

## 2015-09-06 NOTE — Progress Notes (Signed)
Cardiology Office Note   Date:  09/06/2015   ID:  Blake Summers, DOB 11/16/1945, MRN [id number]  PCP:  Blake Belling, MD  Cardiologist:   Dietrich Pates, MD   F/u up of CAD and afib     History of Present Illness: Blake Summers is a 70 y.o. male with a history of CAD, CVA, polyneuropathy, DM, hypoventilation and atrial fib  (rate control and coumadin)  I saw him in clinic in September 2016.  LVEF normal last summer Breathing stable  No CP  No palpitations   Not active       Outpatient Prescriptions Prior to Visit  Medication Sig Dispense Refill  . aspirin 81 MG chewable tablet Chew 1 tablet (81 mg total) by mouth daily.    . brimonidine (ALPHAGAN) 0.15 % ophthalmic solution Place 1 drop into both eyes 2 (two) times daily.     Marland Kitchen dexlansoprazole (DEXILANT) 60 MG capsule Take 1 capsule (60 mg total) by mouth daily. 30 capsule 5  . diltiazem (CARDIZEM) 60 MG tablet Take 1 tablet (60 mg total) by mouth every 8 (eight) hours. 90 tablet 11  . dorzolamide-timolol (COSOPT) 22.3-6.8 MG/ML ophthalmic solution Place 1 drop into both eyes 2 (two) times daily.     . famotidine (PEPCID) 20 MG tablet Take 1 tablet (20 mg total) by mouth 2 (two) times daily. 60 tablet 3  . furosemide (LASIX) 80 MG tablet Take 1 tablet (80 mg total) by mouth 2 (two) times daily. 60 tablet 5  . glucose blood (ONETOUCH VERIO) test strip CHECK BLOOD SUGAR THREE TIMES A DAY 100 each 5  . insulin aspart (NOVOLOG FLEXPEN) 100 UNIT/ML FlexPen Inject into the skin 3 (three) times daily with meals. 3 times a day (just before each meal) 20-25-15 units    . Insulin Glargine (LANTUS SOLOSTAR) 100 UNIT/ML Solostar Pen Inject 15 Units into the skin at bedtime. 15 mL 2  . ketoconazole (NIZORAL) 2 % shampoo Apply 1 application topically once a week.     . lactulose (CHRONULAC) 10 GM/15ML solution TAKE 45 ML'S (3 TABLESPOONFULS) (30 GM TOTAL) TWO TIMES DAILY 960 mL 11  . latanoprost (XALATAN) 0.005 % ophthalmic solution Place 1  drop into both eyes at bedtime. 2.5 mL 4  . lidocaine (XYLOCAINE) 2 % jelly Apply 1 application topically 3 (three) times daily. As needed for pain 40 mL 11  . losartan (COZAAR) 50 MG tablet Take 1 tablet (50 mg total) by mouth daily. 30 tablet 11  . morphine (MS CONTIN) 15 MG 12 hr tablet Take 1 tablet (15 mg total) by mouth every 12 (twelve) hours. 60 tablet 0  . nystatin (MYCOSTATIN) powder Apply topically 4 (four) times daily. 120 g 5  . oxyCODONE-acetaminophen (PERCOCET) 7.5-325 MG tablet Take 1 tablet by mouth every 8 (eight) hours as needed (pain). 90 tablet 0  . OXYGEN Oxygen 3-4 lpm  Vent with sleep    . polyethylene glycol powder (GLYCOLAX/MIRALAX) powder MIX 17 GRAMS IN 8 TO 10 OUNCES OF LIQUIDAND DRINK EVERY DAY AS PHYSICIAN INSTRUCTED 527 g 2  . pravastatin (PRAVACHOL) 40 MG tablet Take 1 tablet (40 mg total) by mouth daily at 6 PM. 90 tablet 3  . senna (SENOKOT) 8.6 MG TABS tablet Take 1 tablet (8.6 mg total) by mouth daily as needed for mild constipation. 50 each 5  . tamsulosin (FLOMAX) 0.4 MG CAPS capsule Take 1 capsule (0.4 mg total) by mouth daily. 30 capsule 6  . topiramate (TOPAMAX)  50 MG tablet Take 1 tablet (50 mg total) by mouth at bedtime. (Patient taking differently: Take 50 mg by mouth 2 (two) times daily. ) 30 tablet 2  . torsemide (DEMADEX) 20 MG tablet Take 1 tablet by mouth daily.    Marland Kitchen. warfarin (COUMADIN) 6 MG tablet Take up to 2 tablets daily or take as directed by anticoagulation clinic 60 tablet 3  . zolpidem (AMBIEN) 10 MG tablet TAKE 1 TABLET BY MOUTH EACH NIGHT AT BEDTIME AS NEEDED SLEEP 30 tablet 5  . fluconazole (DIFLUCAN) 150 MG tablet Take 1 tablet by mouth. Reported on 09/06/2015    . topiramate (TOPAMAX) 50 MG tablet TAKE 1 TABLET BY MOUTH 2 TIMES A DAY (Patient not taking: Reported on 09/06/2015) 60 tablet 5   No facility-administered medications prior to visit.     Allergies:   Neosporin   Past Medical History  Diagnosis Date  . GERD 07/06/2010  .  Edema 10/28/2007  . DIABETES MELLITUS, TYPE II 05/04/2007  . HYPERLIPIDEMIA 07/08/2008  . ALLERGIC RHINITIS 07/08/2008  . GLAUCOMA 07/08/2008  . CEREBROVASCULAR ACCIDENT, HX OF 07/08/2008  . OBSTRUCTIVE SLEEP APNEA 03/06/2008  . DIABETIC ULCER, LEFT LEG 08/23/2009  . 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 HYPERTENSION 10/28/2007  . VENOUS INSUFFICIENCY 03/08/2009  . FATTY LIVER DISEASE 05/19/2008  . BENIGN PROSTATIC HYPERTROPHY 07/08/2008  . 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 NEPHROLITHIASIS, HX OF 07/08/2008  . Morbid obesity (HCC)   . DM nephropathy/sclerosis   . 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 Lumbar spondylosis   . Diarrhea 06/08/2013    Past Surgical History  Procedure Laterality Date  . Lithotripsy  2007  . Tracheostomy tube placement N/A 07/14/2014    Procedure: TRACHEOSTOMY;  Surgeon: Darletta MollSui W Teoh, MD;  [facility];  Service: ENT;  Laterality: N/A;     Social History:  The patient  reports that he quit smoking about 27 years ago. His smoking use included Cigarettes. He has a 7.5 pack-year smoking history. He has never used smokeless tobacco. He reports that he does not drink alcohol or use illicit drugs.   Family History:  The patient's family history includes Asthma in his mother; Cancer in his brother; Heart disease in his brother and brother.    ROS:  Please see the history of present illness. All other systems are reviewed and  Negative to the above problem except as noted.    PHYSICAL EXAM: VS:  BP 122/60 mmHg  Pulse 63  Ht 5\' 6"  (1.676 m)  Wt 303 lb 12.8 oz (137.803 kg)  BMI 49.06 kg/m2  SpO2 93%  GEN: Morbidly obese 70 yo, in no acute distress HEENT: normal Neck: Trach  Cardiac: RRR; no murmurs, rubs, or gallops,1+ edema  Respiratory:  clear to auscultation bilaterally, normal work of breathing  Mild wheeze  Rhonchi GI: soft, nontender, nondistended, + BS  No hepatomegaly  MS: no deformity Moving all extremities   Skin: warm and dry,  no rash Neuro:  Strength and sensation are intact Psych: euthymic mood, full affect   EKG:  EKG is not ordered today.   Lipid Panel    Component Value Date/Time   CHOL 119 11/04/2014 1549   TRIG 255.0* 11/04/2014 1549   HDL 27.30* 11/04/2014 1549   CHOLHDL 4 11/04/2014 1549   VLDL 51.0* 11/04/2014 1549   LDLCALC 65 02/25/2014 1211   LDLDIRECT 55.0 11/04/2014 1549      Wt Readings from Last 3 Encounters:  09/06/15 303 lb 12.8 oz (137.803 kg)  06/04/15 298 lb (135.172 kg)  05/31/15 298 lb (135.172 kg)      ASSESSMENT AND PLAN:  1  Atrial fib  Keep on rate control and coumadin    2.  Hypoventilation syndrome  Followed by Jerilee Hoh   3.   CAD  No symptoms to sugg active angina  4   Morbid obesity  Needs to lose wt.    Wiil get BMET and CBC   Follow up in December   Signed, Dietrich Pates, MD  09/06/2015 3:04 PM    [facility] [address] [place], [place], [place]  [address] Phone: [phone]; Fax: [phone]

## 2015-09-06 NOTE — Patient Instructions (Signed)
Your physician recommends that you continue on your current medications as directed. Please refer to the Current Medication list given to you today. Your physician recommends that you return for lab work in: today (BMET, CBC)  Your physician wants you to follow-up in: December, 2017 with Dr. Tenny Crawoss.  You will receive a reminder letter in the mail two months in advance. If you don't receive a letter, please call our office to schedule the follow-up appointment.

## 2015-09-06 NOTE — Progress Notes (Signed)
Subjective:    Patient ID: Blake Summers, male    DOB: 23-Jan-1946, 70 y.o.   MRN: [id number]  HPI: [name] is a 70 year old male who returns for follow up for chronic pain and medication refill. He states his pain is located in his lower back and right knee. He rates his pain 8. His current exercise regime is performing stretching exercises with bands, light weights and walking with walker short distances in the home. He arrived to clinic in his motorized wheelchair. Wearing his bilateral velcro wraps to lower extremities.  Pain Inventory Average Pain 7 Pain Right Now 8 My pain is sharp, stabbing and aching  In the last 24 hours, has pain interfered with the following? General activity 8 Relation with others 8 Enjoyment of life 8 What TIME of day is your pain at its worst? daytime, evening  Sleep (in general) Poor  Pain is worse with: walking and standing Pain improves with: rest, medication and injections Relief from Meds: 7  Mobility walk without assistance walk with assistance use a cane use a walker how many minutes can you walk? 0 ability to climb steps?  no do you drive?  no use a wheelchair needs help with transfers  Function retired I need assistance with the following:  dressing, bathing, toileting, meal prep, household duties and shopping Do you have any goals in this area?  yes  Neuro/Psych weakness numbness tingling trouble walking dizziness  Prior Studies Any changes since last visit?  no  Physicians involved in your care Any changes since last visit?  no   Family History  Problem Relation Age of Onset  . Cancer Brother     Colon Cancer  . Heart disease Brother   . Heart disease Brother   . Asthma Mother    Social History   Social History  . Marital Status: Married    Spouse Name: N/A  . Number of Children: N/A  . Years of Education: N/A   Occupational History  . Retired     Nature conservation officeroperations manager   Social History Main  Topics  . Smoking status: Former Smoker -- 0.50 packs/day for 15 years    Types: Cigarettes    Quit date: 06/12/1988  . Smokeless tobacco: Never Used  . Alcohol Use: No  . Drug Use: No  . Sexual Activity: Not Asked   Other Topics Concern  . None   Social History Narrative   Diet is "good"   Exercise is limited by medical problems   Past Surgical History  Procedure Laterality Date  . Lithotripsy  2007  . Tracheostomy tube placement N/A 07/14/2014    Procedure: TRACHEOSTOMY;  Surgeon: Darletta MollSui W Teoh, MD;  [facility];  Service: ENT;  Laterality: N/A;   Past Medical History  Diagnosis Date  . GERD 07/06/2010  . Edema 10/28/2007  . DIABETES MELLITUS, TYPE II 05/04/2007  . HYPERLIPIDEMIA 07/08/2008  . ALLERGIC RHINITIS 07/08/2008  . GLAUCOMA 07/08/2008  . CEREBROVASCULAR ACCIDENT, HX OF 07/08/2008  . OBSTRUCTIVE SLEEP APNEA 03/06/2008  . DIABETIC ULCER, LEFT LEG 08/23/2009  . 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 HYPERTENSION 10/28/2007  . VENOUS INSUFFICIENCY 03/08/2009  . FATTY LIVER DISEASE 05/19/2008  . BENIGN PROSTATIC HYPERTROPHY 07/08/2008  . 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 NEPHROLITHIASIS, HX OF 07/08/2008  . Morbid obesity (HCC)   . DM nephropathy/sclerosis   . 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 Lumbar spondylosis   .  Diarrhea 06/08/2013   BP 138/61 mmHg  Pulse 61  Resp 14  SpO2 100%  Opioid Risk Score:   Fall Risk Score:  `1  Depression screen PHQ 2/9  Depression screen [facility] 2/9 06/28/2015 02/16/2015 02/04/2015 12/29/2014 12/08/2014 10/20/2014  Decreased Interest 0 0 0 0 0 0  Down, Depressed, Hopeless 0 0 0 0 0 0  PHQ - 2 Score 0 0 0 0 0 0  Altered sleeping - - - - - 0  Tired, decreased energy - - - - - 2  Change in appetite - - - - - 0  Feeling bad or failure about yourself  - - - - - 0  Trouble concentrating - - - - - 0  Moving slowly or fidgety/restless - - - - - 0  Suicidal thoughts - - - - - 0  PHQ-9 Score - - - - - 2   ]  Review  of Systems  All other systems reviewed and are negative.      Objective:   Physical Exam  Constitutional: He is oriented to person, place, and time. He appears well-developed and well-nourished.  HENT:  Head: Normocephalic and atraumatic.  Neck: Normal range of motion. Neck supple.  Cardiovascular: Normal rate and regular rhythm.   Pulmonary/Chest: Effort normal and breath sounds normal.  Continuous Oxygen 2 liters via PMV/Trach  Musculoskeletal:  Normal Muscle Bulk and Muscle Testing Reveals: Upper Extremities: Full ROM and Muscle Strength 5/5 Back without spinal tenderness Lower Extremities: Full ROM and Muscle Strength 5/5 Arrived in Motorized wheelchair  Neurological: He is alert and oriented to person, place, and time.  Skin: Skin is warm and dry.  Psychiatric: He has a normal mood and affect.  Nursing note and vitals reviewed.         Assessment & Plan:  1. End-stage right knee osteoarthritis: Continue to use Lidocaine gel. Refilled: MS contin 15 mg #60. One tablet every 12 hours and Percocet 7.5/325 mg # 90 pills--use every 8 hours as needed for pain #90. Second script's for the following month. 2. Morbid obesity with generalized deconditioning:  Continue to monitor 3. Chronic low back pain/spondylosis: Continue MS contin with prn percocet.  5. Narcotic induced constipation: Continue with Bowel Program: Senna and Miralax as prescribed. 6. Acute on Chronic Respiratory Failure: Continuous Oxygen: S/P Tracheostomy/ PMV/ Vent at HS.  20 minutes of face to face patient care time was spent during this visit. All questions were encouraged and answered.   F/U in 2 month

## 2015-09-07 ENCOUNTER — Ambulatory Visit: Payer: Medicare Other | Admitting: Internal Medicine

## 2015-09-09 ENCOUNTER — Other Ambulatory Visit: Payer: Self-pay | Admitting: Endocrinology

## 2015-09-09 ENCOUNTER — Telehealth: Payer: Self-pay | Admitting: Endocrinology

## 2015-09-09 NOTE — Telephone Encounter (Signed)
Attempted to reach NaselleNicole. Will try again at a later time.

## 2015-09-09 NOTE — Telephone Encounter (Signed)
Joni ReiningNicole, nurse case Manager would like to know patient could get skilled nursing visits, to help give his wife a break please advise. [phone]  Ex [phone]

## 2015-09-09 NOTE — Telephone Encounter (Signed)
ok 

## 2015-09-09 NOTE — Telephone Encounter (Signed)
See note below and please advise, Thanks! 

## 2015-09-10 ENCOUNTER — Telehealth: Payer: Self-pay | Admitting: *Deleted

## 2015-09-10 DIAGNOSIS — I4891 Unspecified atrial fibrillation: Secondary | ICD-10-CM

## 2015-09-10 NOTE — Telephone Encounter (Signed)
Pt has been notified of lab results and findings by phone with verbal understanding. Pt lives in [place], [place] and has appt 4/12 w/Dr. Sherene SiresWert and would like to have lab work that day since [place]. I said I think Dr. Tenny Crawoss will be ok with this.

## 2015-09-14 NOTE — Telephone Encounter (Signed)
Left a vm advising of MD's note below.

## 2015-09-15 ENCOUNTER — Telehealth: Payer: Self-pay | Admitting: Endocrinology

## 2015-09-15 NOTE — Telephone Encounter (Signed)
[employer] Nurse returning your call about the Skilled Nursing Visits that were requested to be ordered for him.  CB# [phone] x [phone]

## 2015-09-16 ENCOUNTER — Telehealth: Payer: Self-pay | Admitting: Endocrinology

## 2015-09-16 NOTE — Telephone Encounter (Signed)
please call coumadin clinic: Do you want pt to take aspirin in addition to coumadin?

## 2015-09-16 NOTE — Telephone Encounter (Signed)
Message sent to coumadin clinic nurse Bently Morath Mech(Cindy Boyd) waiting on response.

## 2015-09-17 NOTE — Telephone Encounter (Signed)
Blake Summers responded and stated she does have pt's on coumadin and Asprin. Message fwd to Dr. Everardo AllEllison

## 2015-09-20 ENCOUNTER — Telehealth: Payer: Self-pay | Admitting: Endocrinology

## 2015-09-20 NOTE — Telephone Encounter (Signed)
[facility] calling regarding the skilled nursing care order [phone] ext [phone]

## 2015-09-22 ENCOUNTER — Ambulatory Visit (INDEPENDENT_AMBULATORY_CARE_PROVIDER_SITE_OTHER): Payer: Medicare Other | Admitting: General Practice

## 2015-09-22 ENCOUNTER — Ambulatory Visit: Payer: Medicare Other

## 2015-09-22 ENCOUNTER — Encounter: Payer: Self-pay | Admitting: Internal Medicine

## 2015-09-22 ENCOUNTER — Ambulatory Visit (INDEPENDENT_AMBULATORY_CARE_PROVIDER_SITE_OTHER): Payer: Medicare Other | Admitting: Internal Medicine

## 2015-09-22 ENCOUNTER — Other Ambulatory Visit (INDEPENDENT_AMBULATORY_CARE_PROVIDER_SITE_OTHER): Payer: Medicare Other

## 2015-09-22 VITALS — BP 106/64 | HR 71 | Ht 66.0 in | Wt 307.0 lb

## 2015-09-22 DIAGNOSIS — Z5181 Encounter for therapeutic drug level monitoring: Secondary | ICD-10-CM | POA: Diagnosis not present

## 2015-09-22 DIAGNOSIS — I4891 Unspecified atrial fibrillation: Secondary | ICD-10-CM

## 2015-09-22 DIAGNOSIS — J9612 Chronic respiratory failure with hypercapnia: Secondary | ICD-10-CM

## 2015-09-22 DIAGNOSIS — Z8679 Personal history of other diseases of the circulatory system: Secondary | ICD-10-CM | POA: Diagnosis not present

## 2015-09-22 DIAGNOSIS — Z930 Tracheostomy status: Secondary | ICD-10-CM

## 2015-09-22 LAB — CBC WITH DIFFERENTIAL/PLATELET
BASOS ABS: 0 {cells}/uL (ref 0–200)
Basophils Relative: 0 %
EOS ABS: 658 {cells}/uL — AB (ref 15–500)
EOS PCT: 7 %
HCT: 34.9 % — ABNORMAL LOW (ref 38.5–50.0)
Hemoglobin: 10.9 g/dL — ABNORMAL LOW (ref 13.2–17.1)
LYMPHS PCT: 11 %
Lymphs Abs: 1034 cells/uL (ref 850–3900)
MCH: 25.1 pg — AB (ref 27.0–33.0)
MCHC: 31.2 g/dL — AB (ref 32.0–36.0)
MCV: 80.2 fL (ref 80.0–100.0)
MONOS PCT: 8 %
MPV: 10.2 fL (ref 7.5–12.5)
Monocytes Absolute: 752 cells/uL (ref 200–950)
NEUTROS PCT: 74 %
Neutro Abs: 6956 cells/uL (ref 1500–7800)
PLATELETS: 295 10*3/uL (ref 140–400)
RBC: 4.35 MIL/uL (ref 4.20–5.80)
RDW: 15.7 % — AB (ref 11.0–15.0)
WBC: 9.4 10*3/uL (ref 3.8–10.8)

## 2015-09-22 LAB — POCT INR: INR: 2.9

## 2015-09-22 NOTE — Progress Notes (Signed)
Subjective:     Patient ID: Blake Summers, male   DOB: 1946/01/12, .   MRN: [id number]     Brief patient profile:  69 yowm OHS  last doing well in May 2016 sleeping 45 degreees HOB on home vent and 02 and during  Day just 02 3lpm and pmv   History of Present Illness  01/04/2015 1st [place]   office visit/ [name]   Chief Complaint  Patient presents with  . Follow-up    Former pt of Dr Shelle Iron. Pt c/o increased SOB with exertion- relates to hot weather.   cc worse sob since May 2016  lots of nasal drainage with more suctioning required > yellowish,  occ bloody says due to hot weather but really doesn't go out much and lives in a/c.  Not as comfortable on vent  rec I will have advanced adjust your ventilator if needed  Ok to use the ventilator during the day  For drainage take chlortrimeton (chlorpheniramine) 4 mg every 4 hours available over the counter (may cause drowsiness)  And Pepcid 20 mg at bedime If not better add prilosec 20 mg Take 30-60 min before first meal of the day  GERD diet  Please schedule a follow up visit in 3 months but call sooner if needed  Late add :  ? pna > rx Augmentin 875 mg take one pill twice daily  X 10 days   Admit [facility] dx hypercarbia/ ? narc overuse > placed back on vent and did fine per discharge summary   03/05/2015  f/u ov/[name] re: ohs/vent dep / now on ACEi  Chief Complaint  Patient presents with  . HFU    Pt states admitted to [facility] from 9/19-9/19 with respiratory failure and elevated co2. His breathing has improved since he was discharged home.   Wife thinks the reason he's better is that 02 is now per trach but note at the time he worsened was placed on home vent and still had severe hypercarbia on arrival to [facility] ER.  He has been no obvious change in his ventilator in terms of settings since he was complaining the ventilator was not given him enough breath at his last ov  rec No change in recs for now  - beware of choking sensation from  lisinopril - if this develops it needs to be stopped and a substitute found per Dr Pecola Lawless discretion I recommend you establish with [facility] and see [place] as needed (needs to be seen every 3 months)    06/04/2015  f/u ov/[name] re: OHS/ vent and 02 dep  Chief Complaint  Patient presents with  . Follow-up    Pt states overall his breathing is "pretty good". Spouse has noticed occ blood when she suctions his trach.   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 Follow-up    Increased mucus production since the last visit.    mucus is thick on chlorpheniramine / problems with coughing causing balloon to inflate p am deflation > wife squeezed the bulb and it goes away and then returns when he coughs and she keeps repeating this during the day thinking she's squeezing the air out but  clearing just transfering it back to the cuff   No obvious patterns in  day to day or daytime variability or assoc  cp or chest tightness, subjective wheeze or overt sinus or hb symptoms. No unusual exp hx or h/o childhood pna/ asthma or knowledge of premature birth.  Sleeping ok without nocturnal  or early am exacerbation  of respiratory  c/o's or need for noct saba. Also denies any obvious fluctuation of symptoms with weather or environmental changes or other aggravating or alleviating factors except as outlined above   Current Medications, Allergies, Complete Past Medical History, Past Surgical History, Family History, and Social History were reviewed in [facility] electronic medical record.  ROS  The following are not active complaints unless bolded sore throat, dysphagia, dental problems, itching, sneezing,  nasal congestion  or excess/ purulent secretions, ear ache,   fever, chills, sweats, unintended wt loss, classically pleuritic or exertional cp, hemoptysis,  orthopnea pnd or leg swelling, presyncope, palpitations, abdominal pain, anorexia, nausea, vomiting, diarrhea  or change in bowel or bladder habits, change in stools or urine, dysuria,hematuria,  rash, arthralgias, visual complaints, headache, numbness, weakness or ataxia or problems with walking or coordination,  change in mood/affect or memory.            Objective:  Physical Exam   Obese  W/c bound massively obese Wm nad on T collar   03/05/2015        304  > 06/04/2015   298 > 09/22/2015  307     01/04/15 322 lb (146.058 kg)  12/08/14 310 lb (140.615 kg)  11/12/14 302 lb (136.986 kg)    Vital signs reviewed     HEENT: nl dentition, turbinates, and orophanx. Nl external ear canals without cough reflex   NECK :  without JVD/Nodes/TM/ nl carotid upstrokes bilaterally/ trach with pmv in place    LUNGS: no acc muscle use, clear to A and P bilaterally without cough on insp or exp maneuvers   CV:  RRR  no s3 or murmur or increase in P2, no edema   ABD:  soft and nontender with nl excursion in the supine position. No bruits or organomegaly, bowel sounds nl  MS:  warm without deformities, calf tenderness, cyanosis or clubbing  SKIN: warm and dry without lesions    NEURO:  alert, approp, no deficits      Labs reviewed      Chemistry      Component Value Date/Time   NA 141 09/06/2015 1541   K 4.0 09/06/2015 1541   CL 100 09/06/2015 1541   CO2 32* 09/06/2015 1541   BUN 19 09/06/2015 1541   CREATININE 0.91 09/06/2015 1541   CREATININE 1.06 05/31/2015 1653      Component Value Date/Time   CALCIUM 8.7 09/06/2015 1541   CALCIUM 8.6 07/28/2011 1106   ALKPHOS 83 07/12/2014 0228   AST 16 07/12/2014 0228   ALT 14 07/12/2014 0228   BILITOT 0.5 07/12/2014 0228        Lab Results  Component Value Date   TSH 0.98 01/04/2015      Lab Results  Component Value Date   PROBNP 163.0* 05/31/2015                     Assessment:

## 2015-09-22 NOTE — Progress Notes (Signed)
Pre visit review using our clinic review tool, if applicable. No additional management support is needed unless otherwise documented below in the visit note. 

## 2015-09-22 NOTE — Patient Instructions (Signed)
Stop chlorphenriamine and if the nose gets drippy options are zyrtec or allergra/clariton  Suck the air out of the balloon if it gets inflated   Please schedule a follow up visit in 3 months but call sooner if needed

## 2015-09-22 NOTE — Telephone Encounter (Signed)
I contacted [facility] Reining[facility] and requested a call back to verify if we need to place a order for the nursing care or if they just need a verbal order.

## 2015-09-23 ENCOUNTER — Telehealth: Payer: Self-pay | Admitting: Endocrinology

## 2015-09-23 NOTE — Assessment & Plan Note (Signed)
Advised to aspirate the remaining air in bulb anytime it accumulates during the day so trach can fxn normally during the day/ f/u Teoh planned

## 2015-09-23 NOTE — Assessment & Plan Note (Signed)
Complicated by HBP/ Hyperlipidemia/ DM / OHS  Body mass index is 49.57    Lab Results  Component Value Date   TSH 0.98 01/04/2015     Contributing to gerd tendency/ doe/reviewed the need and the process to achieve and maintain neg calorie balance > defer f/u primary care including intermittently monitoring thyroid status

## 2015-09-23 NOTE — Assessment & Plan Note (Signed)
Prior Vent settings: S9 VPAP 14/10 [facility] Admit 06/2014 with worsening RF, required trach and nocturnal vent, required [facility] until 10/08/14. 3 liters pulsed exertion and rest, 3 liters with vpap at bedtime. Per last entry 01/2014 but since then was admitted/ dc from [facility]  - Trach  per Suszanne Connerseoh 07/14/14  - HC03 35  On  01/04/15 despite noct vent  On nocturnal vent as of 01/05/15 : [id number] Settings SIMV/VC, Rate=6, PEEP=+5, PSV=12, VT=500, with 3LPM O2 - HC03  05/31/15 = 39  Down to 32 09/06/15 c/w improving hypercarbia   He is quite alert and the only remaining issue is handling secretions which appear too thick on H1 > try off   I had an extended discussion with the patient and wife reviewing all relevant studies completed to date and  lasting 15 to 20 minutes of a 25 minute visit    Each maintenance medication was reviewed in detail including most importantly the difference between maintenance and prns and under what circumstances the prns are to be triggered using an action plan format that is not reflected in the computer generated alphabetically organized AVS.    Please see instructions for details which were reviewed in writing and the patient given a copy highlighting the part that I personally wrote and discussed at today's ov.

## 2015-09-23 NOTE — Telephone Encounter (Signed)
Nurse case manager returning your call about the orders for this patient she said it is for Skilled Nursing Care 1-2 hours 2-3 times a week [facility]i Reiningicole [phone] She asks for a call back

## 2015-09-26 ENCOUNTER — Telehealth: Payer: Self-pay | Admitting: Endocrinology

## 2015-09-26 NOTE — Telephone Encounter (Signed)
please call patient: I received wound care form If pt needs wound services, he should see wound care clinic. Please let me know

## 2015-09-26 NOTE — Telephone Encounter (Signed)
Paula:  Do you want this pt to take both ASA and coumadin? Thanks  [employer] Lots[employer]an

## 2015-09-28 ENCOUNTER — Telehealth: Payer: Self-pay | Admitting: Endocrinology

## 2015-09-28 NOTE — Telephone Encounter (Signed)
I contacted the pt and advised of note below. Pt voiced understanding.  

## 2015-09-28 NOTE — Telephone Encounter (Signed)
please call patient: I asked Dr Tenny Crawoss, and she wants you to D/C aspirin.

## 2015-09-28 NOTE — Telephone Encounter (Signed)
Forms placed on desk

## 2015-09-28 NOTE — Telephone Encounter (Addendum)
I left a vm advising Dr. Everardo AllEllison is ok with the order below.

## 2015-09-28 NOTE — Telephone Encounter (Signed)
I contacted the pt and advised of note below. Pt stated he and his wife had been purchasing the supplies needed for his leg wound out of pocket and the forms we received would allow the pt to get these supplies for free. Pt wanted to know if we could sign the forms because he does not want to be referred to a Wound Care Clinic at this time.

## 2015-09-28 NOTE — Telephone Encounter (Signed)
Ok, please return the form here

## 2015-10-13 ENCOUNTER — Encounter: Payer: Self-pay | Admitting: Endocrinology

## 2015-10-13 ENCOUNTER — Ambulatory Visit (INDEPENDENT_AMBULATORY_CARE_PROVIDER_SITE_OTHER): Payer: Medicare Other | Admitting: Endocrinology

## 2015-10-13 VITALS — BP 126/74 | HR 65 | Temp 98.1°F | Resp 14 | Ht 66.0 in | Wt 306.6 lb

## 2015-10-13 DIAGNOSIS — L97509 Non-pressure chronic ulcer of other part of unspecified foot with unspecified severity: Secondary | ICD-10-CM

## 2015-10-13 DIAGNOSIS — Z794 Long term (current) use of insulin: Secondary | ICD-10-CM

## 2015-10-13 DIAGNOSIS — E11621 Type 2 diabetes mellitus with foot ulcer: Secondary | ICD-10-CM

## 2015-10-13 LAB — POCT GLYCOSYLATED HEMOGLOBIN (HGB A1C): Hemoglobin A1C: 6.8

## 2015-10-13 MED ORDER — INSULIN GLARGINE 100 UNIT/ML SOLOSTAR PEN: 15.0000 [IU] | PEN_INJECTOR | Freq: Every day | SUBCUTANEOUS | Status: DC

## 2015-10-13 NOTE — Progress Notes (Signed)
Subjective:    Patient ID: Blake Summers, male    DOB: 1945/11/09, 70 y.o.   MRN: [id number]  HPI Pt returns for f/u of diabetes mellitus: DM type: Insulin-requiring type 2 Dx'ed: 1990 Complications: polyneuropathy, CAD, CVA, and foot ulcer.   Therapy: insulin since 1999 DKA: never Severe hypoglycemia: never Pancreatitis: never Other: he takes multiple daily injections; he was turned down for weight-loss surgery, due to comorbidities Interval history: I asked wife, who brings a record of his cbg's which i have reviewed today.  It varies from 61-200's.  It is lowest in the afternoon and HS.   bleeding from the rectum has resolved.   Past Medical History  Diagnosis Date  . GERD 07/06/2010  . Edema 10/28/2007  . DIABETES MELLITUS, TYPE II 05/04/2007  . HYPERLIPIDEMIA 07/08/2008  . ALLERGIC RHINITIS 07/08/2008  . GLAUCOMA 07/08/2008  . CEREBROVASCULAR ACCIDENT, HX OF 07/08/2008  . OBSTRUCTIVE SLEEP APNEA 03/06/2008  . DIABETIC ULCER, LEFT LEG 08/23/2009  . 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 HYPERTENSION 10/28/2007  . VENOUS INSUFFICIENCY 03/08/2009  . FATTY LIVER DISEASE 05/19/2008  . BENIGN PROSTATIC HYPERTROPHY 07/08/2008  . 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 NEPHROLITHIASIS, HX OF 07/08/2008  . Morbid obesity (HCC)   . DM nephropathy/sclerosis   . 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 Lumbar spondylosis   . Diarrhea 06/08/2013    Past Surgical History  Procedure Laterality Date  . Lithotripsy  2007  . Tracheostomy tube placement N/A 07/14/2014    Procedure: TRACHEOSTOMY;  Surgeon: Darletta MollSui W Teoh, MD;  [facility];  Service: ENT;  Laterality: N/A;    Social History   Social History  . Marital Status: Married    Spouse Name: N/A  . Number of Children: N/A  . Years of Education: N/A   Occupational History  . Retired     Nature conservation officeroperations manager   Social History Main Topics  . Smoking status: Former Smoker -- 0.50 packs/day for 15 years    Types:  Cigarettes    Quit date: 06/12/1988  . Smokeless tobacco: Never Used  . Alcohol Use: No  . Drug Use: No  . Sexual Activity: Not on file   Other Topics Concern  . Not on file   Social History Narrative   Diet is "good"   Exercise is limited by medical problems    Current Outpatient Prescriptions on File Prior to Visit  Medication Sig Dispense Refill  . brimonidine (ALPHAGAN) 0.15 % ophthalmic solution Place 1 drop into both eyes 2 (two) times daily.     . chlorpheniramine (CHLOR-TRIMETON) 4 MG tablet Take 4 mg by mouth 2 (two) times daily as needed for allergies.    Marland Kitchen. dexlansoprazole (DEXILANT) 60 MG capsule Take 1 capsule (60 mg total) by mouth daily. 30 capsule 5  . diltiazem (CARDIZEM) 60 MG tablet Take 1 tablet (60 mg total) by mouth every 8 (eight) hours. 90 tablet 11  . dorzolamide-timolol (COSOPT) 22.3-6.8 MG/ML ophthalmic solution Place 1 drop into both eyes 2 (two) times daily.     . famotidine (PEPCID) 20 MG tablet TAKE 1 TABLET BY MOUTH 2 TIMES A DAY 60 tablet 2  . furosemide (LASIX) 80 MG tablet Take 1 tablet (80 mg total) by mouth 2 (two) times daily. 60 tablet 5  . glucose blood (ONETOUCH VERIO) test strip CHECK BLOOD SUGAR THREE TIMES A DAY 100 each 5  . insulin aspart (NOVOLOG FLEXPEN) 100 UNIT/ML FlexPen Inject into the  skin 3 (three) times daily with meals. 3 times a day (just before each meal) 20-20-10 units    . ketoconazole (NIZORAL) 2 % shampoo Apply 1 application topically once a week.     . lactulose (CHRONULAC) 10 GM/15ML solution TAKE 45 ML'S (3 TABLESPOONFULS) (30 GM TOTAL) TWO TIMES DAILY 960 mL 11  . latanoprost (XALATAN) 0.005 % ophthalmic solution Place 1 drop into both eyes at bedtime. 2.5 mL 4  . lidocaine (XYLOCAINE) 2 % jelly Apply 1 application topically 3 (three) times daily. As needed for pain 40 mL 11  . losartan (COZAAR) 50 MG tablet Take 1 tablet (50 mg total) by mouth daily. 30 tablet 11  . morphine (MS CONTIN) 15 MG 12 hr tablet Take 1 tablet  (15 mg total) by mouth every 12 (twelve) hours. 60 tablet 0  . nystatin (MYCOSTATIN) powder Apply topically 4 (four) times daily. 120 g 5  . oxyCODONE-acetaminophen (PERCOCET) 7.5-325 MG tablet Take 1 tablet by mouth every 8 (eight) hours as needed (pain). 90 tablet 0  . OXYGEN Oxygen 3-4 lpm  Vent with sleep    . polyethylene glycol powder (GLYCOLAX/MIRALAX) powder MIX 17 GRAMS IN 8 TO 10 OUNCES OF LIQUIDAND DRINK EVERY DAY AS PHYSICIAN INSTRUCTED 527 g 2  . pravastatin (PRAVACHOL) 40 MG tablet Take 1 tablet (40 mg total) by mouth daily at 6 PM. 90 tablet 3  . senna (SENOKOT) 8.6 MG TABS tablet Take 1 tablet (8.6 mg total) by mouth daily as needed for mild constipation. 50 each 5  . tamsulosin (FLOMAX) 0.4 MG CAPS capsule Take 1 capsule (0.4 mg total) by mouth daily. 30 capsule 6  . topiramate (TOPAMAX) 50 MG tablet Take 1 tablet (50 mg total) by mouth at bedtime. (Patient taking differently: Take 50 mg by mouth 2 (two) times daily. ) 30 tablet 2  . torsemide (DEMADEX) 20 MG tablet Take 1 tablet by mouth daily.    Marland Kitchen warfarin (COUMADIN) 6 MG tablet Take up to 2 tablets daily or take as directed by anticoagulation clinic 60 tablet 3  . zolpidem (AMBIEN) 10 MG tablet TAKE 1 TABLET BY MOUTH EACH NIGHT AT BEDTIME AS NEEDED SLEEP 30 tablet 5   No current facility-administered medications on file prior to visit.    Allergies  Allergen Reactions  . Neosporin [Neomycin-Polymyxin-Gramicidin] Other (See Comments)    unknown    Family History  Problem Relation Age of Onset  . Cancer Brother     Colon Cancer  . Heart disease Brother   . Heart disease Brother   . Asthma Mother     BP 126/74 mmHg  Pulse 65  Temp(Src) 98.1 F (36.7 C) (Oral)  Resp 14  Ht  (1.676 m)  Wt 306 lb 9.6 oz (139.073 kg)  BMI 49.51 kg/m2  SpO2 98%  Review of Systems No weight change    Objective:   Physical Exam VITAL SIGNS:  See vs page GENERAL: no distress.  Morbid obesity.  In wheelchair.  Has 02 on.    SKIN:  Insulin injection sites at the anterior abdomen are normal   A1c=6.8%    Assessment & Plan:  DM: slightly overcontrolled.   Patient is advised the following: Patient Instructions  check your blood sugar twice a day.  vary the time of day when you check, between before the 3 meals, and at bedtime.  also check if you have symptoms of your blood sugar being too high or too low.  please keep a  record of the readings and bring it to your next appointment here.  You can write it on any piece of paper.  please call us sooner if your blood sugar goes below 70, or if you have a lot of readings over 200.   Please come back for a regular physical appointment in 3 months.  For that visit, please unwrap your legs prior to that appointment.   Please reduce the novolog to 3 times a day (just before each meal) 20-20-10 units.  Please continue the same lantus (15 units at bedtime).

## 2015-10-13 NOTE — Patient Instructions (Addendum)
check your blood sugar twice a day.  vary the time of day when you check, between before the 3 meals, and at bedtime.  also check if you have symptoms of your blood sugar being too high or too low.  please keep a record of the readings and bring it to your next appointment here.  You can write it on any piece of paper.  please call us sooner if your blood sugar goes below 70, or if you have a lot of readings over 200.   Please come back for a regular physical appointment in 3 months.  For that visit, please unwrap your legs prior to that appointment.   Please reduce the novolog to 3 times a day (just before each meal) 20-20-10 units.  Please continue the same lantus (15 units at bedtime).

## 2015-10-19 ENCOUNTER — Telehealth: Payer: Self-pay | Admitting: Endocrinology

## 2015-10-19 NOTE — Telephone Encounter (Signed)
please call patient: A recent medication review showed both furosemide and torsemide Ok to d/c torsemide and continue the same furosemide?

## 2015-10-19 NOTE — Telephone Encounter (Signed)
Pt's wife advised of note below and voiced understanding. The Pt will d/c will the torsemide and continue the same furosemide.

## 2015-10-22 ENCOUNTER — Ambulatory Visit (INDEPENDENT_AMBULATORY_CARE_PROVIDER_SITE_OTHER): Payer: Medicare Other | Admitting: General Practice

## 2015-10-22 ENCOUNTER — Telehealth: Payer: Self-pay

## 2015-10-22 DIAGNOSIS — I4891 Unspecified atrial fibrillation: Secondary | ICD-10-CM | POA: Diagnosis not present

## 2015-10-22 DIAGNOSIS — Z5181 Encounter for therapeutic drug level monitoring: Secondary | ICD-10-CM

## 2015-10-22 LAB — POCT INR: INR: 3.6

## 2015-10-22 NOTE — Progress Notes (Signed)
Pre visit review using our clinic review tool, if applicable. No additional management support is needed unless otherwise documented below in the visit note. 

## 2015-10-22 NOTE — Telephone Encounter (Signed)
Referral to [facility] has been sent.

## 2015-10-22 NOTE — Progress Notes (Signed)
I have reviewed and agree with the plan. 

## 2015-10-27 ENCOUNTER — Telehealth: Payer: Self-pay | Admitting: Endocrinology

## 2015-10-27 NOTE — Telephone Encounter (Signed)
Information faxed to [employer]  

## 2015-10-27 NOTE — Telephone Encounter (Signed)
Blake Summers case Production designer, theatre/television/[facility]manager [facility]  Need copy of patient med list phone # [phone]  Ex [phone] Fax # [phone]

## 2015-10-28 ENCOUNTER — Other Ambulatory Visit: Payer: Self-pay | Admitting: Endocrinology

## 2015-11-01 ENCOUNTER — Encounter: Payer: Medicare Other | Attending: Registered Nurse | Admitting: Physical Medicine & Rehabilitation

## 2015-11-01 ENCOUNTER — Encounter: Payer: Self-pay | Admitting: Physical Medicine & Rehabilitation

## 2015-11-01 VITALS — BP 129/69 | HR 71 | Resp 15

## 2015-11-01 DIAGNOSIS — M1711 Unilateral primary osteoarthritis, right knee: Secondary | ICD-10-CM | POA: Diagnosis not present

## 2015-11-01 DIAGNOSIS — M25561 Pain in right knee: Secondary | ICD-10-CM | POA: Diagnosis not present

## 2015-11-01 DIAGNOSIS — Z760 Encounter for issue of repeat prescription: Secondary | ICD-10-CM | POA: Diagnosis not present

## 2015-11-01 DIAGNOSIS — M179 Osteoarthritis of knee, unspecified: Secondary | ICD-10-CM | POA: Diagnosis not present

## 2015-11-01 DIAGNOSIS — G8929 Other chronic pain: Secondary | ICD-10-CM | POA: Insufficient documentation

## 2015-11-01 DIAGNOSIS — Z930 Tracheostomy status: Secondary | ICD-10-CM | POA: Insufficient documentation

## 2015-11-01 DIAGNOSIS — Z5181 Encounter for therapeutic drug level monitoring: Secondary | ICD-10-CM

## 2015-11-01 DIAGNOSIS — Z79899 Other long term (current) drug therapy: Secondary | ICD-10-CM

## 2015-11-01 DIAGNOSIS — E662 Morbid (severe) obesity with alveolar hypoventilation: Secondary | ICD-10-CM | POA: Diagnosis not present

## 2015-11-01 DIAGNOSIS — G609 Hereditary and idiopathic neuropathy, unspecified: Secondary | ICD-10-CM | POA: Diagnosis not present

## 2015-11-01 DIAGNOSIS — K5909 Other constipation: Secondary | ICD-10-CM | POA: Insufficient documentation

## 2015-11-01 DIAGNOSIS — M1712 Unilateral primary osteoarthritis, left knee: Secondary | ICD-10-CM | POA: Diagnosis not present

## 2015-11-01 DIAGNOSIS — M545 Low back pain: Secondary | ICD-10-CM | POA: Insufficient documentation

## 2015-11-01 DIAGNOSIS — T40605A Adverse effect of unspecified narcotics, initial encounter: Secondary | ICD-10-CM | POA: Diagnosis not present

## 2015-11-01 DIAGNOSIS — G894 Chronic pain syndrome: Secondary | ICD-10-CM

## 2015-11-01 DIAGNOSIS — J9620 Acute and chronic respiratory failure, unspecified whether with hypoxia or hypercapnia: Secondary | ICD-10-CM | POA: Insufficient documentation

## 2015-11-01 MED ORDER — MORPHINE SULFATE ER 15 MG PO TBCR: 15.0000 mg | EXTENDED_RELEASE_TABLET | Freq: Two times a day (BID) | ORAL | Status: DC

## 2015-11-01 MED ORDER — OXYCODONE-ACETAMINOPHEN 7.5-325 MG PO TABS: 1.0000 | ORAL_TABLET | Freq: Three times a day (TID) | ORAL | Status: DC | PRN

## 2015-11-01 NOTE — Progress Notes (Signed)
Subjective:    Patient ID: Blake Summers, male    DOB: 09/30/1945, 70 y.o.   MRN: [id number]  HPI   Blake Summers is here in follow up of his chronic pain. His pain levels have been fairly stable. He has become increasingly sedentary. He has a powered chair and doesn't move from that very oftern. He has a trach and is on chronic oxygen via trach. His pain however seems to be the most inhibiting factor for his movement however. He is not very motivated to move however. He has free weights and a theraband. He has a Research scientist (physical sciences)membership to [employer] as well as a pool at home. In honesty, they arent options given his medical issues and weakness.   He continues on percocet 7.5 for pain contro, 90 per month. This provides some relief and allows what little activity he does.       Pain Inventory Average Pain 8 Pain Right Now 7 My pain is sharp and stabbing  In the last 24 hours, has pain interfered with the following? General activity 8 Relation with others 7 Enjoyment of life 7 What TIME of day is your pain at its worst? daytime Sleep (in general) Poor  Pain is worse with: walking and standing Pain improves with: rest, heat/ice, medication and injections Relief from Meds: 8  Mobility walk with assistance use a cane use a walker ability to climb steps?  no do you drive?  yes use a wheelchair transfers alone  Function retired I need assistance with the following:  dressing, bathing, toileting, meal prep, household duties and shopping  Neuro/Psych weakness numbness tingling trouble walking dizziness  Prior Studies Any changes since last visit?  no  Physicians involved in your care Any changes since last visit?  no   Family History  Problem Relation Age of Onset  . Cancer Brother     Colon Cancer  . Heart disease Brother   . Heart disease Brother   . Asthma Mother    Social History   Social History  . Marital Status: Married    Spouse Name: N/A  . Number of  Children: N/A  . Years of Education: N/A   Occupational History  . Retired     Nature conservation officeroperations manager   Social History Main Topics  . Smoking status: Former Smoker -- 0.50 packs/day for 15 years    Types: Cigarettes    Quit date: 06/12/1988  . Smokeless tobacco: Never Used  . Alcohol Use: No  . Drug Use: No  . Sexual Activity: Not Asked   Other Topics Concern  . None   Social History Narrative   Diet is "good"   Exercise is limited by medical problems   Past Surgical History  Procedure Laterality Date  . Lithotripsy  2007  . Tracheostomy tube placement N/A 07/14/2014    Procedure: TRACHEOSTOMY;  Surgeon: Darletta MollSui W Teoh, MD;  [facility];  Service: ENT;  Laterality: N/A;   Past Medical History  Diagnosis Date  . GERD 07/06/2010  . Edema 10/28/2007  . DIABETES MELLITUS, TYPE II 05/04/2007  . HYPERLIPIDEMIA 07/08/2008  . ALLERGIC RHINITIS 07/08/2008  . GLAUCOMA 07/08/2008  . CEREBROVASCULAR ACCIDENT, HX OF 07/08/2008  . OBSTRUCTIVE SLEEP APNEA 03/06/2008  . DIABETIC ULCER, LEFT LEG 08/23/2009  . 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 HYPERTENSION 10/28/2007  . VENOUS INSUFFICIENCY 03/08/2009  . FATTY LIVER DISEASE 05/19/2008  . BENIGN PROSTATIC HYPERTROPHY 07/08/2008  . 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 NEPHROLITHIASIS, HX OF 07/08/2008  .  Morbid obesity (HCC)   . DM nephropathy/sclerosis   . 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 Lumbar spondylosis   . Diarrhea 06/08/2013   BP 129/69 mmHg  Pulse 71  Resp 15  SpO2 97%  Opioid Risk Score:   Fall Risk Score:  `1  Depression screen PHQ 2/9  Depression screen [facility] 2/9 06/28/2015 02/16/2015 02/04/2015 12/29/2014 12/08/2014 10/20/2014  Decreased Interest 0 0 0 0 0 0  Down, Depressed, Hopeless 0 0 0 0 0 0  PHQ - 2 Score 0 0 0 0 0 0  Altered sleeping - - - - - 0  Tired, decreased energy - - - - - 2  Change in appetite - - - - - 0  Feeling bad or failure about yourself  - - - - - 0  Trouble concentrating - - - -  - 0  Moving slowly or fidgety/restless - - - - - 0  Suicidal thoughts - - - - - 0  PHQ-9 Score - - - - - 2      Review of Systems  Neurological: Positive for dizziness, weakness and numbness.       Tingling Gait Instability  All other systems reviewed and are negative.      Objective:   Physical Exam  Constitutional: He is oriented to person, place, and time. He appears well-developed and well-nourished.  Looks bright and in no distress  HENT:  Head: Normocephalic and atraumatic.  Eyes: Conjunctivae are normal. Pupils are equal, round, and reactive to light.  Neck: #6 XL shiley trach with PMV and oxygen continues.  Cardiovascular: Normal rate and regular rhythm.  Pulmonary/Chest: Effort normal and breath sounds normal. No respiratory distress. He has no wheezes.  Abdominal: Soft. Bowel sounds are normal. He exhibits no distension. There is no tenderness.  Musculoskeletal:  . Muscle testing reveals 4-5/5 muscle strength of the upper extremity, and 4/5 of the lower extremity. Exam limited by powered chair/habitus Neurological: He is alert and oriented to person, place, and time. Tinel's + left wrist. He is not earing splints.  Skin: Skin is warm and dry.  Psychiatric: He has a normal mood and affect. His behavior is normal. Thought content normal.    Assessment & Plan:   1. End-stage right knee osteoarthritis: Continue to use lidocaine patches.  Refilled: MS contin 15 mg #60 bid.  Percocet 7.5/325 mg # 90 pills--use every 6 hours as needed for pain.  Second rx'es were provided for next month.  2. Morbid obesity with generalized deconditioning---  -discussed strategies for increasing his mobility.  -he needs to work on it every day.  -HE has to WANT to work through this and make the sacrifices needed -could consider HH PT to get him started with mobility.  Not ready for aquatic therapy yet 3. Chronic low back pain/spondylosis:  -consider further imaging if we want to do  anything more interventional down the road  5. Neuropathy and CTS---continued DAILY splinting, diabetes controlled---probably not a great surgical candidate -continue hs topamax,  qhs  6. Narcotic induced constipation: miralax/senna and diet modifications  -consider movantik trial   15 minutes of face to face patient care time were spent during this visit. All questions were encouraged and answered. Follow up in 2 months with NP .

## 2015-11-01 NOTE — Patient Instructions (Signed)
CONTINUE TO WORK ON YOUR DIET AND REGULAR EXERCISE.   YOU HAVE TO MAKE A DAILY EFFORT TO INCREASE YOUR STRENGTH AND STAMINA.    PLEASE CALL ME WITH ANY PROBLEMS OR QUESTIONS [phone]).

## 2015-11-01 NOTE — Addendum Note (Signed)
Addended by: Letta KocherHADLEY, Kadyn Chovan A on: 11/01/2015 03:01 PM   Modules accepted: Orders

## 2015-11-06 ENCOUNTER — Other Ambulatory Visit: Payer: Self-pay | Admitting: Endocrinology

## 2015-11-06 LAB — 6-ACETYLMORPHINE,TOXASSURE ADD
6-ACETYLMORPHINE: NEGATIVE
6-ACETYLMORPHINE: NOT DETECTED ng/mg{creat}

## 2015-11-06 LAB — TOXASSURE SELECT,+ANTIDEPR,UR

## 2015-11-11 NOTE — Progress Notes (Signed)
Urine drug screen for this encounter is consistent for prescribed medication 

## 2015-11-19 ENCOUNTER — Ambulatory Visit: Payer: Medicare Other

## 2015-11-26 ENCOUNTER — Ambulatory Visit (INDEPENDENT_AMBULATORY_CARE_PROVIDER_SITE_OTHER): Payer: Medicare Other | Admitting: General Practice

## 2015-11-26 DIAGNOSIS — I4891 Unspecified atrial fibrillation: Secondary | ICD-10-CM

## 2015-11-26 DIAGNOSIS — Z5181 Encounter for therapeutic drug level monitoring: Secondary | ICD-10-CM

## 2015-11-26 LAB — POCT INR: INR: 2.9

## 2015-11-26 NOTE — Progress Notes (Signed)
I have reviewed and agree with the plan. 

## 2015-11-26 NOTE — Progress Notes (Signed)
Pre visit review using our clinic review tool, if applicable. No additional management support is needed unless otherwise documented below in the visit note. 

## 2015-12-01 ENCOUNTER — Telehealth: Payer: Self-pay | Admitting: Endocrinology

## 2015-12-01 NOTE — Telephone Encounter (Signed)
Pt wife needs call back Pt has above 200 readings every morning for the past week, has infection in both legs has been put on Cipro.

## 2015-12-01 NOTE — Telephone Encounter (Signed)
See note below and please advise during Dr. Ellison's absence? Thanks! 

## 2015-12-01 NOTE — Telephone Encounter (Signed)
He can increase his Lantus from 15 units to 25 units until morning sugars are below 140

## 2015-12-01 NOTE — Telephone Encounter (Signed)
Pt's spouse call report that [name] is on antibiotic (Cipro 500 mg twice a day) and we need to adjust his coumadin. Please give them a call back

## 2015-12-02 NOTE — Telephone Encounter (Signed)
Per. Dr. Lucianne MussKumar, he contacted the pt last night and advised of new instructions. Pt verbalized understanding.

## 2015-12-06 ENCOUNTER — Ambulatory Visit: Payer: Medicare Other

## 2015-12-08 ENCOUNTER — Ambulatory Visit (INDEPENDENT_AMBULATORY_CARE_PROVIDER_SITE_OTHER): Payer: Medicare Other | Admitting: General Practice

## 2015-12-08 ENCOUNTER — Other Ambulatory Visit: Payer: Self-pay | Admitting: Physical Medicine & Rehabilitation

## 2015-12-08 DIAGNOSIS — Z5181 Encounter for therapeutic drug level monitoring: Secondary | ICD-10-CM | POA: Diagnosis not present

## 2015-12-08 DIAGNOSIS — I4891 Unspecified atrial fibrillation: Secondary | ICD-10-CM

## 2015-12-08 LAB — POCT INR: INR: 1.3

## 2015-12-08 NOTE — Progress Notes (Signed)
Pre visit review using our clinic review tool, if applicable. No additional management support is needed unless otherwise documented below in the visit note. 

## 2015-12-08 NOTE — Progress Notes (Signed)
I have reviewed and agree with the plan. 

## 2015-12-09 ENCOUNTER — Telehealth: Payer: Self-pay | Admitting: General Practice

## 2015-12-09 ENCOUNTER — Telehealth: Payer: Self-pay | Admitting: Endocrinology

## 2015-12-09 NOTE — Telephone Encounter (Signed)
I contacted the pt's wife

## 2015-12-09 NOTE — Telephone Encounter (Signed)
He can increase the evening Lantus up to 34 units, NovoLog the same

## 2015-12-09 NOTE — Telephone Encounter (Signed)
I contacted the pt's wife. She stated the pt is now taking Cipro 500 mg and doxycycline 100 mg. Blood sugar reading as follows:  12/06/2015:  Fasting: 189 8 pm: 179  12/07/2015: Fasting 166 8 pm: 170  12/08/2015:  Fasting: 160 8 pm: 112  12/09/2015: Fasting: 161  Pt is taking 30 units of lantus in the pm and 25 units of novolog tid. Could you please advise during Dr. George HughEllison's absence? Thanks!

## 2015-12-09 NOTE — Telephone Encounter (Signed)
PT wife called to let you know PT has staff infection on his leg, he has been prescribed Doxycycline 100MG .  Also wanted you to know his sugars have still been high.

## 2015-12-09 NOTE — Telephone Encounter (Signed)
I contacted the pt's wife and advised of note below. She voiced understanding. Will call next week to report blood sugar readings.

## 2015-12-09 NOTE — Telephone Encounter (Signed)
Attempted to call patient about coumadin dosing.

## 2015-12-15 ENCOUNTER — Other Ambulatory Visit: Payer: Self-pay | Admitting: *Deleted

## 2015-12-15 ENCOUNTER — Other Ambulatory Visit: Payer: Self-pay | Admitting: Endocrinology

## 2015-12-15 MED ORDER — DILTIAZEM HCL 60 MG PO TABS: 60.0000 mg | ORAL_TABLET | Freq: Three times a day (TID) | ORAL | Status: DC

## 2015-12-17 ENCOUNTER — Telehealth: Payer: Self-pay

## 2015-12-17 NOTE — Telephone Encounter (Signed)
You could go back to the Humulin NPH, but you can also increase the lantus to 40 units QHS

## 2015-12-17 NOTE — Telephone Encounter (Signed)
Pt's wife to called report blood sugar readings.  12/11/2015: Fasting: 176 Pm: 113  12/12/2015: Fasting: 164 Pm: 148  12/13/2015: Fasting: 161 Pm: 208  12/14/2015: Fasting: 173 Pm: 106  12/15/2015:  Fasting: 160  Pm: 137  12/16/2015:  Fasting 131 Pm: 126  12/17/2015: Fasting: 160  Pt is currently taking Lanuts 34 units in the evening and Novolog 30 unit tid with meals. Pt's wife stated she did not think the Lantus was working as well has the humulin did for the pt.  Please advise, Thanks!

## 2015-12-17 NOTE — Telephone Encounter (Signed)
Attempted to reach the pt. Pt was unavailable and vm was not set up. Will try again at a later time.  

## 2015-12-17 NOTE — Telephone Encounter (Signed)
Tried to call patient. No voicemail to leave a message. Will try again later.

## 2015-12-17 NOTE — Telephone Encounter (Signed)
Pt's wife advised of note below. She voiced understanding and agreed to staring lantus 40 units for 1 week and will call back to report blood sugar readings.

## 2015-12-22 ENCOUNTER — Encounter: Payer: Self-pay | Admitting: Internal Medicine

## 2015-12-22 ENCOUNTER — Ambulatory Visit (INDEPENDENT_AMBULATORY_CARE_PROVIDER_SITE_OTHER): Payer: Medicare Other | Admitting: Internal Medicine

## 2015-12-22 VITALS — BP 88/58 | HR 49 | Ht 66.0 in | Wt 320.0 lb

## 2015-12-22 DIAGNOSIS — J9612 Chronic respiratory failure with hypercapnia: Secondary | ICD-10-CM | POA: Diagnosis not present

## 2015-12-22 NOTE — Assessment & Plan Note (Signed)
Complicated by HBP/ Hyperlipidemia/ DM / OHS   Body mass index is 51.67 kg/(m^2).  Lab Results  Component Value Date   TSH 0.98 01/04/2015     Contributing to gerd tendency/ doe/reviewed the need and the process to achieve and maintain neg calorie balance > defer f/u primary care including intermittently monitoring thyroid status

## 2015-12-22 NOTE — Assessment & Plan Note (Signed)
Prior Vent settings: S9 VPAP 14/10 [facility] Admit 06/2014 with worsening RF, required trach and nocturnal vent, required [facility] until 10/08/14. 3 liters pulsed exertion and rest, 3 liters with vpap at bedtime. Per last entry 01/2014 but since then was admitted/ dc from [facility]  - Trach  per Suszanne Connerseoh 07/14/14  - HC03 35  On  01/04/15 despite noct vent  On nocturnal vent as of 01/05/15 : [id number] Settings SIMV/VC, Rate=6, PEEP=+5, PSV=12, VT=500, with 3LPM O2 - HC03  05/31/15 = 39  Down to 32 09/06/15 c/w improving hypercarbia  Wants to try off vent a few times a week > ok with me as long as monitors AMS/ ha/ daytime energy level on days p off vs on and no more than twice weekly  I had an extended discussion with the patient reviewing all relevant studies completed to date and  lasting 15 to 20 minutes of a 25 minute visit    Each maintenance medication was reviewed in detail including most importantly the difference between maintenance and prns and under what circumstances the prns are to be triggered using an action plan format that is not reflected in the computer generated alphabetically organized AVS.    Please see instructions for details which were reviewed in writing and the patient given a copy highlighting the part that I personally wrote and discussed at today's ov.

## 2015-12-22 NOTE — Progress Notes (Signed)
Subjective:     Patient ID: Blake Summers, male   DOB: [birth year], .   MRN: [id number]     Brief patient profile:  69 yowm OHS  last doing well in May 2016 sleeping 45 degreees HOB on home vent and 02 and during  Day just 02 3lpm and pmv   History of Present Illness  01/04/2015 1st [place]   office visit/ Ruthanne Mcneish   Chief Complaint  Patient presents with  . Follow-up    Former pt of Dr Shelle Iron. Pt c/o increased SOB with exertion- relates to hot weather.   cc worse sob since May 2016  lots of nasal drainage with more suctioning required > yellowish,  occ bloody says due to hot weather but really doesn't go out much and lives in a/c.  Not as comfortable on vent  rec I will have advanced adjust your ventilator if needed  Ok to use the ventilator during the day  For drainage take chlortrimeton (chlorpheniramine) 4 mg every 4 hours available over the counter (may cause drowsiness)  And Pepcid 20 mg at bedime If not better add prilosec 20 mg Take 30-60 min before first meal of the day  GERD diet  Please schedule a follow up visit in 3 months but call sooner if needed  Late add :  ? pna > rx Augmentin 875 mg take one pill twice daily  X 10 days   Admit [facility] dx hypercarbia/ ? narc overuse > placed back on vent and did fine per discharge summary         12/22/2015  f/u ov/Alani Lacivita re: chronic resp falure/ ohs/ vent dep hs only / no resp rx x 3lpm at home/ 4 lpm with activity  Chief Complaint  Patient presents with  . Follow-up  walking across the house s 02 eg to bathroom but does not check 02 then/ wants POC  No obvious patterns in  day to day or daytime variability or assoc  cp or chest tightness, subjective wheeze or overt sinus or hb symptoms. No unusual exp hx or h/o childhood pna/ asthma or knowledge of premature birth.  Sleeping ok without nocturnal  or early am exacerbation  of respiratory  c/o's or need for noct saba. Also denies any obvious fluctuation of symptoms with weather or  environmental changes or other aggravating or alleviating factors except as outlined above   Current Medications, Allergies, Complete Past Medical History, Past Surgical History, Family History, and Social History were reviewed in [employer] record.  ROS  The following are not active complaints unless bolded sore throat, dysphagia, dental problems, itching, sneezing,  nasal congestion or excess/ purulent secretions, ear ache,   fever, chills, sweats, unintended wt loss, classically pleuritic or exertional cp, hemoptysis,  orthopnea pnd or leg swelling, presyncope, palpitations, abdominal pain, anorexia, nausea, vomiting, diarrhea  or change in bowel or bladder habits, change in stools or urine, dysuria,hematuria,  rash, arthralgias, visual complaints, headache, numbness, weakness or ataxia or problems with walking or coordination,  change in mood/affect or memory.            Objective:  Physical Exam   Obese  W/c bound massively obese Wm nad on T collar   03/05/2015        304  > 06/04/2015   298 > 09/22/2015  307 >  12/22/2015   320     01/04/15 322 lb (146.058 kg)  12/08/14 310 lb (140.615 kg)  11/12/14 302 lb (136.986 kg)  Vital signs reviewed     HEENT: nl dentition, turbinates, and orophanx. Nl external ear canals without cough reflex   NECK :  without JVD/Nodes/TM/ nl carotid upstrokes bilaterally/ trach with pmv in place    LUNGS: no acc muscle use, clear to A and P bilaterally without cough on insp or exp maneuvers   CV:  RRR  no s3 or murmur or increase in P2, no edema   ABD:  soft and nontender with nl excursion in the supine position. No bruits or organomegaly, bowel sounds nl  MS:  warm without deformities, calf tenderness, cyanosis or clubbing  SKIN: warm and dry without lesions    NEURO:  alert, approp, no deficits      Labs reviewed      Chemistry      Component Value Date/Time   NA 141 09/06/2015 1541   K 4.0 09/06/2015 1541    CL 100 09/06/2015 1541   CO2 32* 09/06/2015 1541   BUN 19 09/06/2015 1541   CREATININE 0.91 09/06/2015 1541   CREATININE 1.06 05/31/2015 1653      Component Value Date/Time   CALCIUM 8.7 09/06/2015 1541   CALCIUM 8.6 07/28/2011 1106   ALKPHOS 83 07/12/2014 0228   AST 16 07/12/2014 0228   ALT 14 07/12/2014 0228   BILITOT 0.5 07/12/2014 0228        Lab Results  Component Value Date   TSH 0.98 01/04/2015     Lab Results  Component Value Date   PROBNP 163.0* 05/31/2015                     Assessment:

## 2015-12-22 NOTE — Patient Instructions (Addendum)
Ok to try off the vent a few nights a week unless more groggy / headache in am or less energy during the day but make sure your elevation is the same or higher   Keep working on the weight   Please schedule a follow up visit in 6 months but call sooner if needed

## 2015-12-24 ENCOUNTER — Ambulatory Visit: Payer: Medicare Other

## 2015-12-31 ENCOUNTER — Encounter: Payer: Medicare Other | Attending: Registered Nurse | Admitting: Registered Nurse

## 2015-12-31 ENCOUNTER — Encounter: Payer: Self-pay | Admitting: Registered Nurse

## 2015-12-31 ENCOUNTER — Telehealth: Payer: Self-pay | Admitting: Endocrinology

## 2015-12-31 VITALS — BP 154/67 | HR 78 | Resp 14

## 2015-12-31 DIAGNOSIS — M545 Low back pain, unspecified: Secondary | ICD-10-CM

## 2015-12-31 DIAGNOSIS — M1712 Unilateral primary osteoarthritis, left knee: Secondary | ICD-10-CM

## 2015-12-31 DIAGNOSIS — J9620 Acute and chronic respiratory failure, unspecified whether with hypoxia or hypercapnia: Secondary | ICD-10-CM | POA: Insufficient documentation

## 2015-12-31 DIAGNOSIS — M179 Osteoarthritis of knee, unspecified: Secondary | ICD-10-CM | POA: Insufficient documentation

## 2015-12-31 DIAGNOSIS — Z930 Tracheostomy status: Secondary | ICD-10-CM | POA: Diagnosis not present

## 2015-12-31 DIAGNOSIS — Z760 Encounter for issue of repeat prescription: Secondary | ICD-10-CM | POA: Insufficient documentation

## 2015-12-31 DIAGNOSIS — G609 Hereditary and idiopathic neuropathy, unspecified: Secondary | ICD-10-CM | POA: Diagnosis not present

## 2015-12-31 DIAGNOSIS — G8929 Other chronic pain: Secondary | ICD-10-CM | POA: Insufficient documentation

## 2015-12-31 DIAGNOSIS — K5909 Other constipation: Secondary | ICD-10-CM | POA: Diagnosis not present

## 2015-12-31 DIAGNOSIS — M1711 Unilateral primary osteoarthritis, right knee: Secondary | ICD-10-CM

## 2015-12-31 DIAGNOSIS — T40605A Adverse effect of unspecified narcotics, initial encounter: Secondary | ICD-10-CM | POA: Insufficient documentation

## 2015-12-31 DIAGNOSIS — E662 Morbid (severe) obesity with alveolar hypoventilation: Secondary | ICD-10-CM

## 2015-12-31 DIAGNOSIS — M25561 Pain in right knee: Secondary | ICD-10-CM | POA: Diagnosis not present

## 2015-12-31 DIAGNOSIS — G894 Chronic pain syndrome: Secondary | ICD-10-CM

## 2015-12-31 MED ORDER — OXYCODONE-ACETAMINOPHEN 7.5-325 MG PO TABS: 1.0000 | ORAL_TABLET | Freq: Three times a day (TID) | ORAL | Status: DC | PRN

## 2015-12-31 MED ORDER — MORPHINE SULFATE ER 15 MG PO TBCR: 15.0000 mg | EXTENDED_RELEASE_TABLET | Freq: Two times a day (BID) | ORAL | Status: DC

## 2015-12-31 NOTE — Telephone Encounter (Signed)
PT wife called for blood sugar readings: Monday: 145 AM, 184 PM Tuesday: 126 AM, 100 PM Wednesday: 158 AM, 150 PM Thursday: 142 AM, 117 PM Friday: 143 AM  Pt wife said shes doing 30 Units of regular insulin and 40 Units of Lantus.  She also stated she is on her last Lantus pen today.

## 2015-12-31 NOTE — Telephone Encounter (Signed)
please call patient: Not bad Please continue the same insulins I'll see you next time.

## 2015-12-31 NOTE — Telephone Encounter (Signed)
I contacted the pt's wife and advised of note md's note below. She voiced understanding. Rx for Lanut's sent. Wife wanted to get your opinion on her taking a trip with husband to [place]. She wanted to clarify if you thought he was health wise ok to take the trip?

## 2015-12-31 NOTE — Progress Notes (Signed)
Subjective:    Patient ID: Blake Summers, male    DOB: [birth year], 70 y.o.   MRN: [id number]  HPI: [name] is a 70 year old male who returns for follow up for chronic pain and medication refill. He states his pain is located in his lower back, bilateral knees and lower extremities. He rates his pain 7. His current exercise regime is performing stretching exercises with bands, light weights and walking with walker short distances in the home. He arrived to clinic in his motorized wheelchair. Wearing  bilateral lower extremities dressing, [facility]  He's using the Vent as needed, he's using it on Friday- Sunday.  Wife in room all questions answered.  Pain Inventory Average Pain 8 Pain Right Now 7 My pain is sharp, burning, stabbing, tingling and aching  In the last 24 hours, has pain interfered with the following? General activity 8 Relation with others 8 Enjoyment of life 8 What TIME of day is your pain at its worst? daytime Sleep (in general) Poor  Pain is worse with: walking and standing Pain improves with: rest, medication and injections Relief from Meds: 7  Mobility walk with assistance use a walker ability to climb steps?  no do you drive?  yes use a wheelchair transfers alone  Function retired I need assistance with the following:  dressing, bathing, meal prep, household duties and shopping  Neuro/Psych bladder control problems weakness numbness tingling trouble walking dizziness  Prior Studies Any changes since last visit?  no  Physicians involved in your care Any changes since last visit?  no   Family History  Problem Relation Age of Onset  . Cancer Brother     Colon Cancer  . Heart disease Brother   . Heart disease Brother   . Asthma Mother    Social History   Social History  . Marital Status: Married    Spouse Name: N/A  . Number of Children: N/A  . Years of Education: N/A   Occupational History    . Retired     Nature conservation officer   Social History Main Topics  . Smoking status: Former Smoker -- 0.50 packs/day for 15 years    Types: Cigarettes    Quit date: 06/12/1988  . Smokeless tobacco: Never Used  . Alcohol Use: No  . Drug Use: No  . Sexual Activity: Not Asked   Other Topics Concern  . None   Social History Narrative   Diet is "good"   Exercise is limited by medical problems   Past Surgical History  Procedure Laterality Date  . Lithotripsy  2007  . Tracheostomy tube placement N/A 07/14/2014    Procedure: TRACHEOSTOMY;  Surgeon: Darletta Moll, MD;  Location: [facility];  Service: ENT;  Laterality: N/A;   Past Medical History  Diagnosis Date  . GERD 07/06/2010  . Edema 10/28/2007  . DIABETES MELLITUS, TYPE II 05/04/2007  . HYPERLIPIDEMIA 07/08/2008  . ALLERGIC RHINITIS 07/08/2008  . GLAUCOMA 07/08/2008  . CEREBROVASCULAR ACCIDENT, HX OF 07/08/2008  . OBSTRUCTIVE SLEEP APNEA 03/06/2008  . DIABETIC ULCER, LEFT LEG 08/23/2009  . 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 HYPERTENSION 10/28/2007  . VENOUS INSUFFICIENCY 03/08/2009  . FATTY LIVER DISEASE 05/19/2008  . BENIGN PROSTATIC HYPERTROPHY 07/08/2008  . 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 NEPHROLITHIASIS, HX OF 07/08/2008  . Morbid obesity (HCC)   . DM nephropathy/sclerosis   . 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  Lumbar spondylosis   . Diarrhea 06/08/2013   BP 154/67 mmHg  Pulse 78  Resp 14  SpO2 95%  Opioid Risk Score:   Fall Risk Score:  `1  Depression screen PHQ 2/9  Depression screen [facility] 2/9 06/28/2015 02/16/2015 02/04/2015 12/29/2014 12/08/2014 10/20/2014  Decreased Interest 0 0 0 0 0 0  Down, Depressed, Hopeless 0 0 0 0 0 0  PHQ - 2 Score 0 0 0 0 0 0  Altered sleeping - - - - - 0  Tired, decreased energy - - - - - 2  Change in appetite - - - - - 0  Feeling bad or failure about yourself  - - - - - 0  Trouble concentrating - - - - - 0  Moving slowly or fidgety/restless - - - - - 0  Suicidal  thoughts - - - - - 0  PHQ-9 Score - - - - - 2     Review of Systems  HENT: Negative.   Eyes: Negative.   Respiratory: Positive for shortness of breath.   Gastrointestinal: Negative.   Endocrine: Negative.   Genitourinary: Negative.   Musculoskeletal: Positive for back pain.  Allergic/Immunologic: Negative.   Neurological: Positive for dizziness, weakness and numbness.       Tingling   Hematological: Negative.   Psychiatric/Behavioral: Negative.   All other systems reviewed and are negative.      Objective:   Physical Exam  Constitutional: He is oriented to person, place, and time. He appears well-developed and well-nourished.  HENT:  Head: Normocephalic and atraumatic.  Neck: Normal range of motion. Neck supple.  Cardiovascular: Normal rate and regular rhythm.   Pulmonary/Chest: Effort normal and breath sounds normal.  Continuous PMV/ Tach 2 liters Nasal Cannula  Musculoskeletal:  Normal Muscle Bulk and Muscle Testing Reveals: Upper Extremities: Full ROM and Muscle Strength 5/5 Back without tenderness Lower Extremities: Full ROM and Muscle Strength 5/5 Arrived in Motorized wheelchair  Neurological: He is alert and oriented to person, place, and time.  Skin: Skin is warm and dry.  Psychiatric: He has a normal mood and affect.  Nursing note and vitals reviewed.         Assessment & Plan:  1. End-stage right knee osteoarthritis: Continue to use Lidocaine gel. Refilled: MS contin 15 mg #60. One tablet every 12 hours and Percocet 7.5/325 mg # 90 pills--use every 8 hours as needed for pain #90. Second script's for the following month. 2. Morbid obesity with generalized deconditioning:  Continue to monitor 3. Chronic low back pain/spondylosis: Continue MS contin with prn percocet.  4. Narcotic induced constipation: Continue with Bowel Program: Senna and Miralax as prescribed. 5. Acute on Chronic Respiratory Failure: Continuous Oxygen: S/P Tracheostomy/ PMV/ Vent at  HS. 6. Peripheral Neuropathy: Continue Topamax  20 minutes of face to face patient care time was spent during this visit. All questions were encouraged and answered.   F/U in 2 month

## 2015-12-31 NOTE — Telephone Encounter (Signed)
See below and please advise, Thanks!  

## 2015-12-31 NOTE — Telephone Encounter (Signed)
Patient need a refill of Insulin Glargine (LANTUS SOLOSTAR) 100 UNIT/ML Solostar Pen, [facility] [place] - [id number] [phone] (Phone) [phone] (Fax)

## 2015-12-31 NOTE — Telephone Encounter (Signed)
i think you should go, but stretch your legs every few hrs

## 2016-01-03 ENCOUNTER — Telehealth: Payer: Self-pay | Admitting: Internal Medicine

## 2016-01-03 ENCOUNTER — Encounter: Payer: Self-pay | Admitting: Internal Medicine

## 2016-01-03 NOTE — Telephone Encounter (Signed)
PT wife stated the pharmacy has not received script for Lantus and needs this ASAP.  Please contact pharmacy.

## 2016-01-03 NOTE — Telephone Encounter (Signed)
PT wife called back again, please resubmit Lantus script to pharmacy.

## 2016-01-03 NOTE — Telephone Encounter (Signed)
Received call from [facility]. Pt wife upset, yelling on phone, stating that Lantus insulin Rx was called in too late, pt out of med, must have new refill. Wants new Rx to [facility]. Initial review of chart showed Lantus dose to be 15units daily. However, it appears that wife may have been increasing dose at home. Called in Lantus 15units daily to [facility] in [facility]. I spoke with pharmacists.  Additional changes, increased dose to be addressed with PCP tomorrow.

## 2016-01-04 ENCOUNTER — Telehealth: Payer: Self-pay

## 2016-01-04 MED ORDER — INSULIN GLARGINE 100 UNIT/ML SOLOSTAR PEN: 15.0000 [IU] | PEN_INJECTOR | Freq: Every day | SUBCUTANEOUS | 2 refills | Status: DC

## 2016-01-04 NOTE — Telephone Encounter (Signed)
Rx for Lantus faxed to pharmacy per patient request.

## 2016-01-04 NOTE — Telephone Encounter (Signed)
Patient is out of his medication Insulin Glargine (LANTUS SOLOSTAR) 100 UNIT/ML Solostar Pen. [facility] - [place] - [address] [phone] (Phone) [phone] (Fax)

## 2016-01-04 NOTE — Telephone Encounter (Signed)
Pt has been taking 40 u of lantus at night the reading from Friday are from him taking 40 u at night  Not the 15 u as prescribed  Dr. Everardo All, are you ok with him taking the 40 u a night and if so can we call in the rx according to this?  Please call pt asap with Dr. George Hugh decision.

## 2016-01-05 ENCOUNTER — Ambulatory Visit (INDEPENDENT_AMBULATORY_CARE_PROVIDER_SITE_OTHER): Payer: Medicare Other | Admitting: General Practice

## 2016-01-05 DIAGNOSIS — Z5181 Encounter for therapeutic drug level monitoring: Secondary | ICD-10-CM | POA: Diagnosis not present

## 2016-01-05 DIAGNOSIS — I4891 Unspecified atrial fibrillation: Secondary | ICD-10-CM | POA: Diagnosis not present

## 2016-01-05 LAB — POCT INR: INR: 4.6

## 2016-01-05 NOTE — Progress Notes (Signed)
I have reviewed and agree with the plan. 

## 2016-01-11 ENCOUNTER — Other Ambulatory Visit: Payer: Self-pay | Admitting: Physical Medicine & Rehabilitation

## 2016-01-19 ENCOUNTER — Ambulatory Visit (INDEPENDENT_AMBULATORY_CARE_PROVIDER_SITE_OTHER): Payer: Medicare Other | Admitting: General Practice

## 2016-01-19 DIAGNOSIS — I4891 Unspecified atrial fibrillation: Secondary | ICD-10-CM | POA: Diagnosis not present

## 2016-01-19 DIAGNOSIS — Z5181 Encounter for therapeutic drug level monitoring: Secondary | ICD-10-CM

## 2016-01-19 LAB — POCT INR: INR: 2.6

## 2016-01-19 NOTE — Progress Notes (Signed)
I have reviewed and agree with the plan. 

## 2016-01-21 ENCOUNTER — Telehealth: Payer: Self-pay

## 2016-01-24 ENCOUNTER — Telehealth: Payer: Self-pay

## 2016-01-24 MED ORDER — INSULIN NPH (HUMAN) (ISOPHANE) 100 UNIT/ML ~~LOC~~ SUSP: 15.0000 [IU] | Freq: Every day | SUBCUTANEOUS | 3 refills | Status: DC

## 2016-01-24 NOTE — Telephone Encounter (Signed)
Pt's wife called about their upcoming trip on 01/28/2016. Pt's wife wanted to check and see what the process would be to having a catheter placed before they leave or if this could be done. I advised we did not have the equipment to do this in our office, but she still wanted me to ask. She also stated the pt has been off of his Lantus for about 1 month because the insurance would not pay for the Lantus. She stated the pt's fasting blood sugars have been in the 200s since stopping the Lantus.  Please advise, Thanks!

## 2016-01-24 NOTE — Telephone Encounter (Signed)
Cheapest option is to change lantus to "NPH," 15 units qhs, which is cheapest at [facility] OK to change?

## 2016-01-24 NOTE — Telephone Encounter (Signed)
PT wife is returning your phone call.

## 2016-01-24 NOTE — Telephone Encounter (Signed)
Rx submitted for the novolin N and pt's wife notified. Pt's wife voiced understanding.

## 2016-01-24 NOTE — Telephone Encounter (Signed)
Requested a call back from the pt to discuss.  

## 2016-01-25 NOTE — Telephone Encounter (Signed)
ERROR

## 2016-02-02 ENCOUNTER — Telehealth: Payer: Self-pay

## 2016-02-02 NOTE — Telephone Encounter (Signed)
Pt is due for AWV. Next appt with PCP is in Sept.   Can that appt be changed?

## 2016-02-05 ENCOUNTER — Other Ambulatory Visit: Payer: Self-pay | Admitting: Endocrinology

## 2016-02-07 ENCOUNTER — Other Ambulatory Visit: Payer: Self-pay | Admitting: *Deleted

## 2016-02-07 ENCOUNTER — Other Ambulatory Visit: Payer: Self-pay | Admitting: Internal Medicine

## 2016-02-07 DIAGNOSIS — M109 Gout, unspecified: Secondary | ICD-10-CM

## 2016-02-07 DIAGNOSIS — E1065 Type 1 diabetes mellitus with hyperglycemia: Principal | ICD-10-CM

## 2016-02-07 DIAGNOSIS — E1040 Type 1 diabetes mellitus with diabetic neuropathy, unspecified: Secondary | ICD-10-CM

## 2016-02-07 DIAGNOSIS — IMO0002 Reserved for concepts with insufficient information to code with codable children: Secondary | ICD-10-CM

## 2016-02-07 MED ORDER — DEXLANSOPRAZOLE 60 MG PO CPDR: 1.0000 | DELAYED_RELEASE_CAPSULE | Freq: Every day | ORAL | 5 refills | Status: DC

## 2016-02-07 MED ORDER — PRAVASTATIN SODIUM 40 MG PO TABS: 40.0000 mg | ORAL_TABLET | Freq: Every day | ORAL | 2 refills | Status: DC

## 2016-02-08 ENCOUNTER — Encounter: Payer: Self-pay | Admitting: Endocrinology

## 2016-02-08 LAB — HM DIABETES EYE EXAM

## 2016-02-14 ENCOUNTER — Other Ambulatory Visit: Payer: Self-pay | Admitting: Endocrinology

## 2016-02-15 ENCOUNTER — Ambulatory Visit: Payer: Medicare Other | Admitting: Endocrinology

## 2016-02-15 ENCOUNTER — Other Ambulatory Visit: Payer: Self-pay | Admitting: *Deleted

## 2016-02-15 MED ORDER — DILTIAZEM HCL 60 MG PO TABS: 60.0000 mg | ORAL_TABLET | Freq: Three times a day (TID) | ORAL | 0 refills | Status: DC

## 2016-02-16 ENCOUNTER — Encounter: Payer: Self-pay | Admitting: Endocrinology

## 2016-02-16 ENCOUNTER — Ambulatory Visit (INDEPENDENT_AMBULATORY_CARE_PROVIDER_SITE_OTHER): Payer: Medicare Other | Admitting: Endocrinology

## 2016-02-16 VITALS — BP 128/64 | HR 64 | Ht 66.0 in | Wt 321.0 lb

## 2016-02-16 DIAGNOSIS — Z23 Encounter for immunization: Secondary | ICD-10-CM

## 2016-02-16 DIAGNOSIS — Z119 Encounter for screening for infectious and parasitic diseases, unspecified: Secondary | ICD-10-CM | POA: Insufficient documentation

## 2016-02-16 DIAGNOSIS — I482 Chronic atrial fibrillation, unspecified: Secondary | ICD-10-CM

## 2016-02-16 DIAGNOSIS — Z0000 Encounter for general adult medical examination without abnormal findings: Secondary | ICD-10-CM | POA: Diagnosis not present

## 2016-02-16 DIAGNOSIS — K7581 Nonalcoholic steatohepatitis (NASH): Secondary | ICD-10-CM | POA: Diagnosis not present

## 2016-02-16 DIAGNOSIS — E1159 Type 2 diabetes mellitus with other circulatory complications: Secondary | ICD-10-CM

## 2016-02-16 DIAGNOSIS — M109 Gout, unspecified: Secondary | ICD-10-CM

## 2016-02-16 DIAGNOSIS — N400 Enlarged prostate without lower urinary tract symptoms: Secondary | ICD-10-CM | POA: Diagnosis not present

## 2016-02-16 DIAGNOSIS — Z0189 Encounter for other specified special examinations: Secondary | ICD-10-CM

## 2016-02-16 LAB — POCT GLYCOSYLATED HEMOGLOBIN (HGB A1C): HEMOGLOBIN A1C: 7

## 2016-02-16 MED ORDER — INSULIN ASPART 100 UNIT/ML FLEXPEN: PEN_INJECTOR | SUBCUTANEOUS | 11 refills | Status: DC

## 2016-02-16 MED ORDER — INSULIN GLARGINE 100 UNIT/ML SOLOSTAR PEN: 20.0000 [IU] | PEN_INJECTOR | Freq: Every day | SUBCUTANEOUS | 2 refills | Status: DC

## 2016-02-16 NOTE — Patient Instructions (Addendum)
check your blood sugar twice a day.  vary the time of day when you check, between before the 3 meals, and at bedtime.  also check if you have symptoms of your blood sugar being too high or too low.  please keep a record of the readings and bring it to your next appointment here.  You can write it on any piece of paper.  please call us sooner if your blood sugar goes below 70, or if you have a lot of readings over 200.   Please come back for a follow-up appointment in 3 months.   Please reduce the novolog to 3 times a day (just before each meal) 20-20-5 units.  Please increase the same lantus to 20 units at bedtime).   Please consider these measures for your health:  minimize alcohol.  Do not use tobacco products.  Have a colonoscopy at least every 10 years from age 71.  Keep firearms safely stored.  Always use seat belts.  have working smoke alarms in your home.  See an eye doctor and dentist regularly.  Never drive under the influence of alcohol or drugs (including prescription drugs).  Those with fair skin should take precautions against the sun, and should carefully examine their skin once per month, for any new or changed moles. It is critically important to prevent falling down (keep floor areas well-lit, dry, and free of loose objects.  If you have a cane, walker, or wheelchair, you should use it, even for short trips around the house.  Wear flat-soled shoes.  Also, try not to rush).

## 2016-02-16 NOTE — Progress Notes (Signed)
we discussed code status.  pt requests full code, but would not want to be started or maintained on artificial life-support measures if there was not a reasonable chance of recovery 

## 2016-02-16 NOTE — Progress Notes (Signed)
Subjective:    Patient ID: Blake Summers, male    DOB: 1945-11-30, 70 y.o.   MRN: [id number]  HPI Pt is here for regular wellness examination, and is feeling pretty well in general, and says chronic med probs are stable, except as noted below Past Medical History:  Diagnosis Date  . ALLERGIC RHINITIS 07/08/2008  . BENIGN PROSTATIC HYPERTROPHY 07/08/2008  . CEREBROVASCULAR ACCIDENT, HX OF 07/08/2008  . DEGENERATIVE JOINT DISEASE 05/24/2009   R knee, end stage  . DIABETES MELLITUS, TYPE II 05/04/2007  . DIABETIC ULCER, LEFT LEG 08/23/2009  . Diarrhea 06/08/2013  . DM nephropathy/sclerosis   . Edema 10/28/2007  . 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 FATTY LIVER DISEASE 05/19/2008  . GERD 07/06/2010  . GLAUCOMA 07/08/2008  . HYPERLIPIDEMIA 07/08/2008  . HYPERTENSION 10/28/2007  . 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 Lumbar spondylosis   . Morbid obesity (HCC)   . NEPHROLITHIASIS, HX OF 07/08/2008  . OBSTRUCTIVE SLEEP APNEA 03/06/2008  . VENOUS INSUFFICIENCY 03/08/2009    Past Surgical History:  Procedure Laterality Date  . LITHOTRIPSY  2007  . TRACHEOSTOMY TUBE PLACEMENT N/A 07/14/2014   Procedure: TRACHEOSTOMY;  Surgeon: Darletta MollSui W Teoh, MD;  [facility];  Service: ENT;  Laterality: N/A;    Social History   Social History  . Marital status: Married    Spouse name: N/A  . Number of children: N/A  . Years of education: N/A   Occupational History  . Retired Retired    Nature conservation officeroperations manager   Social History Main Topics  . Smoking status: Former Smoker    Packs/day: 0.50    Years: 15.00    Types: Cigarettes    Quit date: 06/12/1988  . Smokeless tobacco: Never Used  . Alcohol use No  . Drug use: No  . Sexual activity: Not on file   Other Topics Concern  . Not on file   Social History Narrative   Diet is "good"   Exercise is limited by medical problems    Current Outpatient Prescriptions on File Prior to Visit  Medication Sig Dispense Refill  . brimonidine (ALPHAGAN) 0.15 %  ophthalmic solution Place 1 drop into both eyes 2 (two) times daily.     . chlorpheniramine (CHLOR-TRIMETON) 4 MG tablet Take 4 mg by mouth 2 (two) times daily as needed for allergies.    Marland Kitchen. dexlansoprazole (DEXILANT) 60 MG capsule Take 1 capsule (60 mg total) by mouth daily. 30 capsule 5  . diltiazem (CARDIZEM) 60 MG tablet Take 1 tablet (60 mg total) by mouth every 8 (eight) hours. 180 tablet 0  . dorzolamide-timolol (COSOPT) 22.3-6.8 MG/ML ophthalmic solution Place 1 drop into both eyes 2 (two) times daily.     . famotidine (PEPCID) 20 MG tablet TAKE 1 TABLET BY MOUTH 2 TIMES A DAY 60 tablet 11  . furosemide (LASIX) 80 MG tablet TAKE 1 TABLET BY MOUTH 2 TIMES A DAY 60 tablet 11  . glucose blood (ONETOUCH VERIO) test strip CHECK BLOOD SUGAR THREE TIMES A DAY 100 each 5  . ketoconazole (NIZORAL) 2 % shampoo Apply 1 application topically once a week.     . lactulose (CHRONULAC) 10 GM/15ML solution TAKE 45 ML'S (3 TABLESPOONFULS) (30 GM TOTAL) TWO TIMES DAILY 960 mL 11  . latanoprost (XALATAN) 0.005 % ophthalmic solution Place 1 drop into both eyes at bedtime. 2.5 mL 4  . lidocaine (XYLOCAINE) 2 % jelly Apply 1 application topically 3 (three) times daily. As needed for pain 40 mL 11  .  losartan (COZAAR) 50 MG tablet Take 1 tablet (50 mg total) by mouth daily. 30 tablet 11  . morphine (MS CONTIN) 15 MG 12 hr tablet Take 1 tablet (15 mg total) by mouth every 12 (twelve) hours. 60 tablet 0  . NOVOFINE 30G X 8 MM MISC USE 4 TIMES A DAY 120 each 11  . nystatin (MYCOSTATIN) powder Apply topically 4 (four) times daily. 120 g 5  . oxyCODONE-acetaminophen (PERCOCET) 7.5-325 MG tablet Take 1 tablet by mouth every 8 (eight) hours as needed (pain). 90 tablet 0  . OXYGEN Oxygen 3-4 lpm  Vent with sleep    . polyethylene glycol powder (GLYCOLAX/MIRALAX) powder MIX 17 GRAMS (TO LINE INSIDE LID) IN 8 TO 10 OUNCES OF LIQUID AND DRINK EVERY DAY AS PHYSICIAN INSTRUCTED 527 g 11  . pravastatin (PRAVACHOL) 40 MG tablet  Take 1 tablet (40 mg total) by mouth daily at 6 PM. 90 tablet 2  . senna (SENOKOT) 8.6 MG TABS tablet Take 1 tablet (8.6 mg total) by mouth daily as needed for mild constipation. 50 each 5  . tamsulosin (FLOMAX) 0.4 MG CAPS capsule TAKE 1 CAPSULE BY MOUTH DAILY 30 capsule 11  . topiramate (TOPAMAX) 50 MG tablet Take 1 tablet (50 mg total) by mouth at bedtime. (Patient taking differently: Take 50 mg by mouth 2 (two) times daily. ) 30 tablet 2  . topiramate (TOPAMAX) 50 MG tablet TAKE 1 TABLET BY MOUTH 2 TIMES A DAY 60 tablet 0  . warfarin (COUMADIN) 6 MG tablet TAKE UP TO 2 TABLETS DAILY OR AS DIRECTED BY THE COUMADIN CLINIC 60 tablet 1  . zolpidem (AMBIEN) 10 MG tablet TAKE 1 TABLET BY MOUTH EACH NIGHT AT BEDTIME AS NEEDED SLEEP 30 tablet 5   No current facility-administered medications on file prior to visit.     Allergies  Allergen Reactions  . Neosporin [Neomycin-Polymyxin-Gramicidin] Other (See Comments)    unknown    Family History  Problem Relation Age of Onset  . Cancer Brother     Colon Cancer  . Heart disease Brother   . Heart disease Brother   . Asthma Mother     BP 128/64   Pulse 64   Ht 5\' 6"  (1.676 m)   Wt (!) 321 lb (145.6 kg)   SpO2 97%   BMI 51.81 kg/m    Review of Systems  Constitutional: Negative for fever.  HENT: Negative for hearing loss.   Eyes: Negative for visual disturbance.  Respiratory:       No change in chronic sob  Cardiovascular: Negative for chest pain.  Gastrointestinal: Negative for blood in stool.  Endocrine: Negative for cold intolerance.  Genitourinary: Negative for difficulty urinating.  Musculoskeletal: Positive for back pain.  Skin: Negative for rash.  Allergic/Immunologic: Positive for environmental allergies.  Neurological: Negative for syncope.  Hematological: Bruises/bleeds easily.  Psychiatric/Behavioral: Negative for dysphoric mood.       Objective:   Physical Exam VS: see vs page GEN: no distress.  Morbid obesity.   Has 02 on.  In wheelchair.   HEAD: head: no deformity eyes: no periorbital swelling, no proptosis.   external nose and ears are normal.   mouth: no lesion seen.  NECK: tracheostomy is present.   CHEST WALL: no deformity LUNGS: clear to auscultation CV: reg rate and rhythm, no murmur ABD: abdomen is soft, nontender.  no hepatosplenomegaly.  not distended.  no hernia.  Obesity limits exam.  MUSCULOSKELETAL: muscle bulk and strength are grossly normal.  no  obvious joint swelling.  gait is normal and steady RECTAL: unable to safely position pt for this. EXTEMITIES: both legs and feet are wrapped.   PULSES: no carotid bruit. NEURO:  cn 2-12 grossly intact.   readily moves all 4's.  sensation is intact to touch on the legs.  SKIN:  Normal texture and temperature.  No rash or suspicious lesion is visible.   NODES:  None palpable at the neck.  PSYCH: alert, well-oriented.  Does not appear anxious nor depressed.       Assessment & Plan:  Wellness visit today, with problems stable, except as noted.   SEPARATE EVALUATION FOLLOWS--EACH PROBLEM HERE IS NEW, NOT RESPONDING TO TREATMENT, OR POSES SIGNIFICANT RISK TO THE PATIENT'S HEALTH: HISTORY OF THE PRESENT ILLNESS: Pt returns for f/u of diabetes mellitus: DM type: Insulin-requiring type 2 Dx'ed: 1990 Complications: polyneuropathy, CAD, CVA, and foot ulcer.   Therapy: insulin since 1999 DKA: never Severe hypoglycemia: never Pancreatitis: never Other: he takes multiple daily injections; he was turned down for weight-loss surgery, due to comorbidities Interval history: He denies hypoglycemia.  no cbg record, but states cbg's are in the high-100's up to the 200's.  It is lowest at hs.  PAST MEDICAL HISTORY reviewed and up to date today REVIEW OF SYSTEMS: He has gained weight PHYSICAL EXAMINATION: VITAL SIGNS:  See vs page GENERAL: no distress Ext: legs are bandaged LAB/XRAY RESULTS: i personally reviewed electrocardiogram tracing  (today):  Indication: DM Impression: AF IMPRESSION: A1c=7%

## 2016-02-18 ENCOUNTER — Ambulatory Visit (INDEPENDENT_AMBULATORY_CARE_PROVIDER_SITE_OTHER): Payer: Medicare Other | Admitting: General Practice

## 2016-02-18 DIAGNOSIS — Z5181 Encounter for therapeutic drug level monitoring: Secondary | ICD-10-CM

## 2016-02-18 DIAGNOSIS — I4891 Unspecified atrial fibrillation: Secondary | ICD-10-CM

## 2016-02-18 LAB — POCT INR: INR: 2

## 2016-02-20 NOTE — Progress Notes (Signed)
I have reviewed and agree with the plan. 

## 2016-02-23 ENCOUNTER — Encounter: Payer: Self-pay | Admitting: Registered Nurse

## 2016-02-23 ENCOUNTER — Encounter: Payer: Medicare Other | Attending: Registered Nurse | Admitting: Registered Nurse

## 2016-02-23 VITALS — BP 100/60 | HR 80

## 2016-02-23 DIAGNOSIS — K5909 Other constipation: Secondary | ICD-10-CM | POA: Insufficient documentation

## 2016-02-23 DIAGNOSIS — G609 Hereditary and idiopathic neuropathy, unspecified: Secondary | ICD-10-CM

## 2016-02-23 DIAGNOSIS — M25561 Pain in right knee: Secondary | ICD-10-CM | POA: Insufficient documentation

## 2016-02-23 DIAGNOSIS — M1712 Unilateral primary osteoarthritis, left knee: Secondary | ICD-10-CM

## 2016-02-23 DIAGNOSIS — J9620 Acute and chronic respiratory failure, unspecified whether with hypoxia or hypercapnia: Secondary | ICD-10-CM | POA: Insufficient documentation

## 2016-02-23 DIAGNOSIS — E662 Morbid (severe) obesity with alveolar hypoventilation: Secondary | ICD-10-CM | POA: Diagnosis not present

## 2016-02-23 DIAGNOSIS — M1711 Unilateral primary osteoarthritis, right knee: Secondary | ICD-10-CM | POA: Diagnosis not present

## 2016-02-23 DIAGNOSIS — Z930 Tracheostomy status: Secondary | ICD-10-CM | POA: Insufficient documentation

## 2016-02-23 DIAGNOSIS — M179 Osteoarthritis of knee, unspecified: Secondary | ICD-10-CM | POA: Diagnosis not present

## 2016-02-23 DIAGNOSIS — M545 Low back pain, unspecified: Secondary | ICD-10-CM

## 2016-02-23 DIAGNOSIS — G8929 Other chronic pain: Secondary | ICD-10-CM | POA: Insufficient documentation

## 2016-02-23 DIAGNOSIS — G894 Chronic pain syndrome: Secondary | ICD-10-CM

## 2016-02-23 DIAGNOSIS — Z760 Encounter for issue of repeat prescription: Secondary | ICD-10-CM | POA: Insufficient documentation

## 2016-02-23 DIAGNOSIS — T40605A Adverse effect of unspecified narcotics, initial encounter: Secondary | ICD-10-CM | POA: Diagnosis not present

## 2016-02-23 MED ORDER — MORPHINE SULFATE ER 15 MG PO TBCR: 15.0000 mg | EXTENDED_RELEASE_TABLET | Freq: Two times a day (BID) | ORAL | 0 refills | Status: DC

## 2016-02-23 MED ORDER — OXYCODONE-ACETAMINOPHEN 7.5-325 MG PO TABS: 1.0000 | ORAL_TABLET | Freq: Three times a day (TID) | ORAL | 0 refills | Status: DC | PRN

## 2016-02-23 NOTE — Progress Notes (Signed)
Subjective:    Patient ID: Blake Summers, male    DOB: 07-Jan-1946, 70 y.o.   MRN: [id number]  HPI: Blake Summers is a 70 year old male who returns for follow up for chronic pain and medication refill. He states his pain is located in his lower back and right knee.He rates his pain 8. His current exercise regime is performing stretching exercises with bands, light weights and walking with walker short distances in the home. He arrived to clinic in his motorized wheelchair. Wearing  bilateral lower extremities dressing, [facility]  He's using the Vent occassionally at HS. Wife in room all questions answered.   Pain Inventory Average Pain 8 Pain Right Now 8 My pain is sharp, stabbing and aching  In the last 24 hours, has pain interfered with the following? General activity 8 Relation with others 7 Enjoyment of life 7 What TIME of day is your pain at its worst? daytime Sleep (in general) Poor  Pain is worse with: walking and standing Pain improves with: rest, heat/ice and medication Relief from Meds: 7  Mobility walk with assistance use a cane use a walker use a wheelchair needs help with transfers  Function retired I need assistance with the following:  dressing, bathing, meal prep, household duties and shopping  Neuro/Psych bladder control problems weakness numbness tingling trouble walking dizziness  Prior Studies Any changes since last visit?  no  Physicians involved in your care Any changes since last visit?  no   Family History  Problem Relation Age of Onset  . Cancer Brother     Colon Cancer  . Heart disease Brother   . Heart disease Brother   . Asthma Mother    Social History   Social History  . Marital status: Married    Spouse name: N/A  . Number of children: N/A  . Years of education: N/A   Occupational History  . Retired Retired    Nature conservation officer   Social History Main Topics  . Smoking status:  Former Smoker    Packs/day: 0.50    Years: 15.00    Types: Cigarettes    Quit date: 06/12/1988  . Smokeless tobacco: Never Used  . Alcohol use No  . Drug use: No  . Sexual activity: Not Asked   Other Topics Concern  . None   Social History Narrative   Diet is "good"   Exercise is limited by medical problems   Past Surgical History:  Procedure Laterality Date  . LITHOTRIPSY  2007  . TRACHEOSTOMY TUBE PLACEMENT N/A 07/14/2014   Procedure: TRACHEOSTOMY;  Surgeon: Darletta Moll, MD;  Location: [facility] OR;  Service: ENT;  Laterality: N/A;   Past Medical History:  Diagnosis Date  . ALLERGIC RHINITIS 07/08/2008  . BENIGN PROSTATIC HYPERTROPHY 07/08/2008  . CEREBROVASCULAR ACCIDENT, HX OF 07/08/2008  . DEGENERATIVE JOINT DISEASE 05/24/2009   R knee, end stage  . DIABETES MELLITUS, TYPE II 05/04/2007  . DIABETIC ULCER, LEFT LEG 08/23/2009  . Diarrhea 06/08/2013  . DM nephropathy/sclerosis   . Edema 10/28/2007  . 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 FATTY LIVER DISEASE 05/19/2008  . GERD 07/06/2010  . GLAUCOMA 07/08/2008  . HYPERLIPIDEMIA 07/08/2008  . HYPERTENSION 10/28/2007  . 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 Lumbar spondylosis   . Morbid obesity (HCC)   . NEPHROLITHIASIS, HX OF 07/08/2008  . OBSTRUCTIVE SLEEP APNEA 03/06/2008  . VENOUS INSUFFICIENCY 03/08/2009   BP 100/60 (BP Location: Right Arm,  Patient Position: Sitting, Cuff Size: Large)   Pulse 80   SpO2 93% Comment: 3 L [place]  Opioid Risk Score:   Fall Risk Score:  `1  Depression screen PHQ 2/9  [facility] 02/23/2016 06/28/2015 02/16/2015 02/04/2015 12/29/2014 12/08/2014 10/20/2014  Decreased Interest 0 0 0 0 0 0 0  Down, Depressed, Hopeless 0 0 0 0 0 0 0  PHQ - 2 Score 0 0 0 0 0 0 0  Altered sleeping - - - - - - 0  Tired, decreased energy - - - - - - 2  Change in appetite - - - - - - 0  Feeling bad or failure about yourself  - - - - - - 0  Trouble concentrating - - - - - - 0  Moving slowly or fidgety/restless - - - - - -  0  Suicidal thoughts - - - - - - 0  PHQ-9 Score - - - - - - 2  Some recent data might be hidden   Review of Systems  All other systems reviewed and are negative.      Objective:   Physical Exam  Constitutional: He is oriented to person, place, and time. He appears well-developed and well-nourished.  HENT:  Head: Normocephalic and atraumatic.  Neck: Normal range of motion. Neck supple.  Cardiovascular: Normal rate and regular rhythm.   Pulmonary/Chest: Effort normal and breath sounds normal.  PMV/ Trach/ Continuous Oxygen 3 liters nasal cannula  Musculoskeletal:  Normal Muscle Bulk and Muscle Testing Reveals: Upper Extremities: Full ROM and Muscle Strength 5/5 Back without spinal tenderness Lower Extremities: Full ROM and Muscle Strength 5/5 Right Lower Extremity Flexion Produces Pain into Patella Arrived in Motorized Wheelchair   Neurological: He is alert and oriented to person, place, and time.  Skin: Skin is warm and dry.  Psychiatric: He has a normal mood and affect.  Nursing note and vitals reviewed.         Assessment & Plan:  1. End-stage right knee osteoarthritis: Continue to use Lidocaine gel. Refilled: MS contin 15 mg #60. One tablet every 12 hours and Percocet 7.5/325 mg # 90 pills--use every 8 hours as needed for pain #90. Second script's for the following month. 2. Morbid obesity with generalized deconditioning:  Continue to monitor 3. Chronic low back pain/spondylosis: Continue MS contin with prn percocet.  4. Narcotic induced constipation: Continue with Bowel Program: Senna and Miralax as prescribed. 5. Acute on Chronic Respiratory Failure: Continuous Oxygen: S/P Tracheostomy/ PMV/ Vent at HS. Pulmonary Following 6. Peripheral Neuropathy: Continue Topamax  20 minutes of face to face patient care time was spent during this visit. All questions were encouraged and answered.   F/U in 2 month

## 2016-02-28 ENCOUNTER — Other Ambulatory Visit: Payer: Self-pay | Admitting: Endocrinology

## 2016-02-28 NOTE — Telephone Encounter (Signed)
Please refer request to coumadin clinic. 

## 2016-02-28 NOTE — Telephone Encounter (Signed)
Pt has had reading of 209 this AM Sunday 198 and 226 Saturday 228 AM and 221 PM Friday 236 in AM 188 PM Thursday AM 194 PM 192 Wednesday AM 273

## 2016-02-28 NOTE — Telephone Encounter (Signed)
See message and please advise, Thanks!  

## 2016-02-29 ENCOUNTER — Telehealth: Payer: Self-pay

## 2016-02-29 NOTE — Telephone Encounter (Signed)
I contacted the patient's wife and advised of message. She voiced understanding and will call back on Friday to report blood sugar readings.

## 2016-02-29 NOTE — Telephone Encounter (Signed)
Please verify no recent steroids Please increase the novolog to 3 times a day (just before each meal) 25-25-10 units.  Please increase the lantus to 30 units at bedtime.  Call 2-3 days to report cbg's

## 2016-02-29 NOTE — Telephone Encounter (Signed)
[facility]. Please see note and advise,thansk!  Pt has had reading of 209 this AM Sunday 198 and 226 Saturday 228 AM and 221 PM Friday 236 in AM 188 PM Thursday AM 194 PM 192 Wednesday AM 273

## 2016-03-01 ENCOUNTER — Other Ambulatory Visit: Payer: Self-pay | Admitting: General Practice

## 2016-03-01 MED ORDER — WARFARIN SODIUM 6 MG PO TABS: ORAL_TABLET | ORAL | 1 refills | Status: DC

## 2016-03-02 ENCOUNTER — Other Ambulatory Visit: Payer: Self-pay | Admitting: Endocrinology

## 2016-03-02 ENCOUNTER — Telehealth: Payer: Self-pay | Admitting: Endocrinology

## 2016-03-02 NOTE — Telephone Encounter (Signed)
I contacted the patient and advised of message. Patient voiced understanding and had no further questions.  

## 2016-03-02 NOTE — Telephone Encounter (Signed)
please call patient: I got refill request for the ambien.  However, it is not safe to take with your pain medication, so please hold off for now.

## 2016-03-03 NOTE — Telephone Encounter (Signed)
Tuesday BS reading 226 AM PM 259 Wednesday BS reading AM 209 PM 209 Thursday AM 186 PM 200 Friday AM 196

## 2016-03-03 NOTE — Telephone Encounter (Signed)
Increase Lantus to 40 units at bedtime.

## 2016-03-03 NOTE — Telephone Encounter (Signed)
See message, could you please advise during Dr. Everardo AllEllison absence? Patient is currently taking novolog 3 times a day (just before each meal) 25-25-10 units and lantus 30 units at bedtime

## 2016-03-03 NOTE — Telephone Encounter (Signed)
Patients wife notified

## 2016-03-03 NOTE — Telephone Encounter (Signed)
Attempted to reach the paitent or the patient's wife. Was unable to reach either. Will try again at a later time.

## 2016-03-13 ENCOUNTER — Telehealth: Payer: Self-pay | Admitting: Endocrinology

## 2016-03-13 NOTE — Telephone Encounter (Signed)
Patient is back on antibiotic for his leg wound, clindamycin, and is still high reading in the 200dreds  insulin aspart (NOVOLOG FLEXPEN) 100 UNIT/ML FlexPen   [facility] - [place] - [id number] [phone] (Phone) [phone] (Fax)

## 2016-03-14 ENCOUNTER — Other Ambulatory Visit: Payer: Self-pay | Admitting: Endocrinology

## 2016-03-14 MED ORDER — INSULIN ASPART 100 UNIT/ML FLEXPEN: PEN_INJECTOR | SUBCUTANEOUS | 4 refills | Status: DC

## 2016-03-14 MED ORDER — INSULIN ASPART 100 UNIT/ML FLEXPEN: PEN_INJECTOR | SUBCUTANEOUS | 11 refills | Status: DC

## 2016-03-14 NOTE — Telephone Encounter (Signed)
See message and please advise, Thanks!  

## 2016-03-14 NOTE — Telephone Encounter (Signed)
Ok, please increase novolog to 30-30-15 units, and continue 40 units of Lantus.

## 2016-03-14 NOTE — Telephone Encounter (Signed)
I contacted the patient's wife and advised of new instructions she voiced understanding and had no further questions at this time.

## 2016-03-14 NOTE — Telephone Encounter (Signed)
Increase novolog to 3 times a day (just before each meal) 25-25-10 units. Call 2-3 days, to report cbg's.

## 2016-03-14 NOTE — Telephone Encounter (Signed)
I contacted the patient's wife and he is already taking 25-25-10 of Novolog and 40 units of Lantus. Please advise on what the dosage should be. Thanks!

## 2016-03-15 ENCOUNTER — Other Ambulatory Visit: Payer: Self-pay | Admitting: Physical Medicine & Rehabilitation

## 2016-03-24 ENCOUNTER — Ambulatory Visit (INDEPENDENT_AMBULATORY_CARE_PROVIDER_SITE_OTHER): Payer: Medicare Other | Admitting: General Practice

## 2016-03-24 DIAGNOSIS — Z5181 Encounter for therapeutic drug level monitoring: Secondary | ICD-10-CM | POA: Diagnosis not present

## 2016-03-24 DIAGNOSIS — I4891 Unspecified atrial fibrillation: Secondary | ICD-10-CM

## 2016-03-24 LAB — POCT INR: INR: 3.6

## 2016-03-24 NOTE — Progress Notes (Signed)
I have reviewed and agree with the plan. 

## 2016-04-04 ENCOUNTER — Other Ambulatory Visit: Payer: Self-pay

## 2016-04-04 MED ORDER — INSULIN GLARGINE 100 UNIT/ML SOLOSTAR PEN: 40.0000 [IU] | PEN_INJECTOR | Freq: Every day | SUBCUTANEOUS | 2 refills | Status: DC

## 2016-04-05 ENCOUNTER — Telehealth: Payer: Self-pay | Admitting: Endocrinology

## 2016-04-05 ENCOUNTER — Other Ambulatory Visit: Payer: Self-pay | Admitting: Endocrinology

## 2016-04-05 MED ORDER — INSULIN GLARGINE 100 UNIT/ML SOLOSTAR PEN
40.0000 [IU] | PEN_INJECTOR | Freq: Every day | SUBCUTANEOUS | 2 refills | Status: DC
Start: 2016-04-05 — End: 2016-04-28

## 2016-04-05 NOTE — Telephone Encounter (Signed)
The refill was sent earlier today. What time of day is the blood sugar highest?  Have you recently received steroids?

## 2016-04-05 NOTE — Telephone Encounter (Signed)
Refill submitted for the Lantus. Please advise if any adjustments should be made. Thanks!

## 2016-04-05 NOTE — Telephone Encounter (Signed)
Pt wife called in and said that the pharmacy still has not received his Lantus and to let you know that his sugars are over 200.

## 2016-04-05 NOTE — Telephone Encounter (Signed)
Attempted to reach the patient or the patient's wife. Will attempt patient at another time.

## 2016-04-06 NOTE — Telephone Encounter (Signed)
Please increase the lantus to 50 units at bedtime Call next week, to report cbg's.

## 2016-04-06 NOTE — Telephone Encounter (Signed)
I contacted the patient and he stated his blood sugars are highest when he wakes up. Patient confirmed he has not had any recent steroid injections.

## 2016-04-07 NOTE — Telephone Encounter (Signed)
I contacted patient and advised of message. Patient voiced understanding and had no further questions at this time. Patient stated he would have his wife call back if she had any questions.

## 2016-04-07 NOTE — Telephone Encounter (Signed)
I contacted the patient and advised of message. He voiced understanding and had no further questions at this time.  

## 2016-04-07 NOTE — Telephone Encounter (Signed)
Patient's wife called back and reported the patient's actual numbers for his blood sugar readings.  04/02/2016:  Am 181 Pm 101  04/03/2016: AM: 169  Pm: 276  10/24: Am: 180 Pm: 230  10/25: Am: 190 Afternoon (255 pm) 211  10/26: Am 194  Pm 162   10/27  10 am 203 Patient's wife stated the patient was not correct in stating his blood sugar are higher in the am and is worried increasing to 50 units of lantus will not help his blood sugar readings. She states his novolog might need to be increased. Could you please review and advise? Thanks!

## 2016-04-07 NOTE — Telephone Encounter (Signed)
Based on the cbg's, please increase the lantus to 60 units at bedtime I'll see you next time.

## 2016-04-21 ENCOUNTER — Ambulatory Visit (INDEPENDENT_AMBULATORY_CARE_PROVIDER_SITE_OTHER): Payer: Medicare Other | Admitting: General Practice

## 2016-04-21 ENCOUNTER — Ambulatory Visit: Payer: Medicare Other

## 2016-04-21 DIAGNOSIS — I4891 Unspecified atrial fibrillation: Secondary | ICD-10-CM

## 2016-04-21 DIAGNOSIS — Z5181 Encounter for therapeutic drug level monitoring: Secondary | ICD-10-CM | POA: Diagnosis not present

## 2016-04-21 LAB — POCT INR: INR: 1.5

## 2016-04-21 NOTE — Progress Notes (Signed)
I have reviewed and agree with the plan. 

## 2016-04-21 NOTE — Patient Instructions (Signed)
Pre visit review using our clinic review tool, if applicable. No additional management support is needed unless otherwise documented below in the visit note. 

## 2016-04-26 ENCOUNTER — Encounter: Payer: Self-pay | Admitting: Physical Medicine & Rehabilitation

## 2016-04-26 ENCOUNTER — Encounter: Payer: Medicare Other | Attending: Registered Nurse | Admitting: Physical Medicine & Rehabilitation

## 2016-04-26 VITALS — BP 158/77 | HR 76 | Resp 14

## 2016-04-26 DIAGNOSIS — M25561 Pain in right knee: Secondary | ICD-10-CM | POA: Insufficient documentation

## 2016-04-26 DIAGNOSIS — E662 Morbid (severe) obesity with alveolar hypoventilation: Secondary | ICD-10-CM | POA: Diagnosis not present

## 2016-04-26 DIAGNOSIS — G609 Hereditary and idiopathic neuropathy, unspecified: Secondary | ICD-10-CM

## 2016-04-26 DIAGNOSIS — G8929 Other chronic pain: Secondary | ICD-10-CM | POA: Insufficient documentation

## 2016-04-26 DIAGNOSIS — Z760 Encounter for issue of repeat prescription: Secondary | ICD-10-CM | POA: Insufficient documentation

## 2016-04-26 DIAGNOSIS — Z930 Tracheostomy status: Secondary | ICD-10-CM | POA: Diagnosis not present

## 2016-04-26 DIAGNOSIS — K5909 Other constipation: Secondary | ICD-10-CM | POA: Insufficient documentation

## 2016-04-26 DIAGNOSIS — Z5181 Encounter for therapeutic drug level monitoring: Secondary | ICD-10-CM

## 2016-04-26 DIAGNOSIS — J9620 Acute and chronic respiratory failure, unspecified whether with hypoxia or hypercapnia: Secondary | ICD-10-CM | POA: Insufficient documentation

## 2016-04-26 DIAGNOSIS — M545 Low back pain: Secondary | ICD-10-CM | POA: Insufficient documentation

## 2016-04-26 DIAGNOSIS — T40605A Adverse effect of unspecified narcotics, initial encounter: Secondary | ICD-10-CM | POA: Insufficient documentation

## 2016-04-26 DIAGNOSIS — M1711 Unilateral primary osteoarthritis, right knee: Secondary | ICD-10-CM | POA: Diagnosis not present

## 2016-04-26 DIAGNOSIS — M179 Osteoarthritis of knee, unspecified: Secondary | ICD-10-CM | POA: Insufficient documentation

## 2016-04-26 DIAGNOSIS — M1712 Unilateral primary osteoarthritis, left knee: Secondary | ICD-10-CM

## 2016-04-26 MED ORDER — MORPHINE SULFATE ER 15 MG PO TBCR: 15.0000 mg | EXTENDED_RELEASE_TABLET | Freq: Two times a day (BID) | ORAL | 0 refills | Status: DC

## 2016-04-26 MED ORDER — OXYCODONE-ACETAMINOPHEN 7.5-325 MG PO TABS: 1.0000 | ORAL_TABLET | Freq: Three times a day (TID) | ORAL | 0 refills | Status: DC | PRN

## 2016-04-26 NOTE — Addendum Note (Signed)
Addended by: Jerry CarasHAYNES, Thana Ramp I on: 04/26/2016 03:26 PM   Modules accepted: Orders

## 2016-04-26 NOTE — Progress Notes (Signed)
Subjective:    Patient ID: Blake Summers, male    DOB: May 15, 1946, 70 y.o.   MRN: [id number]  HPI   [name] is here in follow up of his chronic pain. He has been fairly stable from a pain standpoint. He continues with a chronic trach and breathing assistance at night. He's hopeful to come off the vent soon but needs to d/w pulmonology first. He feels he sleeps better without the vent.   He would like to lose weight but finds it difficult due to his breathing and his size. He does simple upper extremity exercises and tends to be consistent with these.  He remains on ms contin and percocet for pain control. This regimen works for him. He has a functioning bowel program between senokot,miralax and occasional lactulose     Pain Inventory Average Pain 8 Pain Right Now 7 My pain is sharp, burning, dull, stabbing and aching  In the last 24 hours, has pain interfered with the following? General activity 8 Relation with others 8 Enjoyment of life 8 What TIME of day is your pain at its worst? daytime Sleep (in general) Poor  Pain is worse with: walking, bending and standing Pain improves with: rest, heat/ice, medication and injections Relief from Meds: 7  Mobility use a walker ability to climb steps?  no do you drive?  yes use a wheelchair transfers alone  Function retired I need assistance with the following:  dressing, bathing, toileting, meal prep, household duties and shopping  Neuro/Psych weakness numbness tingling trouble walking dizziness  Prior Studies Any changes since last visit?  no  Physicians involved in your care Any changes since last visit?  no   Family History  Problem Relation Age of Onset  . Cancer Brother     Colon Cancer  . Heart disease Brother   . Heart disease Brother   . Asthma Mother    Social History   Social History  . Marital status: Married    Spouse name: N/A  . Number of children: N/A  . Years of education: N/A    Occupational History  . Retired Retired    Nature conservation officeroperations manager   Social History Main Topics  . Smoking status: Former Smoker    Packs/day: 0.50    Years: 15.00    Types: Cigarettes    Quit date: 06/12/1988  . Smokeless tobacco: Never Used  . Alcohol use No  . Drug use: No  . Sexual activity: Not Asked   Other Topics Concern  . None   Social History Narrative   Diet is "good"   Exercise is limited by medical problems   Past Surgical History:  Procedure Laterality Date  . LITHOTRIPSY  2007  . TRACHEOSTOMY TUBE PLACEMENT N/A 07/14/2014   Procedure: TRACHEOSTOMY;  Surgeon: Darletta MollSui W Teoh, MD;  [facility];  Service: ENT;  Laterality: N/A;   Past Medical History:  Diagnosis Date  . ALLERGIC RHINITIS 07/08/2008  . BENIGN PROSTATIC HYPERTROPHY 07/08/2008  . CEREBROVASCULAR ACCIDENT, HX OF 07/08/2008  . DEGENERATIVE JOINT DISEASE 05/24/2009   R knee, end stage  . DIABETES MELLITUS, TYPE II 05/04/2007  . DIABETIC ULCER, LEFT LEG 08/23/2009  . Diarrhea 06/08/2013  . DM nephropathy/sclerosis   . Edema 10/28/2007  . 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 FATTY LIVER DISEASE 05/19/2008  . GERD 07/06/2010  . GLAUCOMA 07/08/2008  . HYPERLIPIDEMIA 07/08/2008  . HYPERTENSION 10/28/2007  . 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 Lumbar spondylosis   . Morbid obesity (  HCC)   . NEPHROLITHIASIS, HX OF 07/08/2008  . OBSTRUCTIVE SLEEP APNEA 03/06/2008  . VENOUS INSUFFICIENCY 03/08/2009   BP (!) 158/77   Pulse 76   Resp 14   SpO2 95%   Opioid Risk Score:   Fall Risk Score:  `1  Depression screen PHQ 2/9  [facility] 02/23/2016 06/28/2015 02/16/2015 02/04/2015 12/29/2014 12/08/2014 10/20/2014  Decreased Interest 0 0 0 0 0 0 0  Down, Depressed, Hopeless 0 0 0 0 0 0 0  PHQ - 2 Score 0 0 0 0 0 0 0  Altered sleeping - - - - - - 0  Tired, decreased energy - - - - - - 2  Change in appetite - - - - - - 0  Feeling bad or failure about yourself  - - - - - - 0  Trouble concentrating - - - - - -  0  Moving slowly or fidgety/restless - - - - - - 0  Suicidal thoughts - - - - - - 0  PHQ-9 Score - - - - - - 2  Some recent data might be hidden     Review of Systems  Constitutional: Negative.   HENT: Negative.   Eyes: Negative.   Respiratory: Negative.   Cardiovascular: Negative.   Gastrointestinal: Negative.   Endocrine: Negative.   Genitourinary: Negative.   Musculoskeletal: Negative.   Skin: Negative.   Allergic/Immunologic: Negative.   Neurological: Negative.   Hematological: Negative.   Psychiatric/Behavioral: Negative.   All other systems reviewed and are negative.      Objective:   Physical Exam  Constitutional: AxO x 3. Morbidly obese HENT:  Head: Normocephalic and atraumatic.  Eyes: Conjunctivae are normal. Pupils are equal, round, and reactive to light.  Neck: #6 XL shiley trach with PMV.  Cardiovascular: RRR  Pulmonary/Chest: wearing oxygen, no distress  Abdominal: Soft. Bowel sounds are normal. He exhibits no distension. There is no tenderness.  Musculoskeletal:   MMT: 5/5 muscle strength of the upper extremity, and 4- to 4/5 of the lower extremity.  Lower extremity with unna wraps. Some weeping.  Neurological: He is alert and oriented to person, place, and time. Tinel's + left wrist. He is not earing splints.  Skin: Skin is warm and dry.  Psychiatric:  Pleasant.  His behavior is normal. Thought content normal.    Assessment & Plan:   1. End-stage right knee osteoarthritis: Continue to use lidocaine patches.  Refilled: MS contin 15 mg #60 bid.  Percocet 7.5/325 mg # 90 pills--use every 6 hours as needed for pain.  Second rx'es were provided for next month.  2. Morbid obesity with generalized deconditioning  -reviewed basic core muscle exercises, standing "sit ups" 3. Chronic low back pain/spondylosis:  -improve sitting/standing as above 5. Neuropathy and CTS---continued DAILY splinting, diabetes controlled---probably not a great surgical  candidate -continue hs topamax, 50mg  qhs  6. Narcotic induced constipation: miralax/senna and diet modifications  -chronulac prn  -current regimen is effective  15 minutes of face to face patient care time were spent during this visit. All questions were encouraged and answered. Follow up in 2 months with NP .

## 2016-04-28 ENCOUNTER — Telehealth: Payer: Self-pay | Admitting: Endocrinology

## 2016-04-28 MED ORDER — INSULIN GLARGINE 100 UNIT/ML SOLOSTAR PEN: 60.0000 [IU] | PEN_INJECTOR | Freq: Every day | SUBCUTANEOUS | 2 refills | Status: DC

## 2016-04-28 NOTE — Telephone Encounter (Signed)
Refill submitted per patient's request.  

## 2016-04-28 NOTE — Telephone Encounter (Signed)
Pt is on 60 u of Lantus called to [facility]

## 2016-05-02 LAB — TOXASSURE SELECT,+ANTIDEPR,UR

## 2016-05-02 LAB — 6-ACETYLMORPHINE,TOXASSURE ADD
6-ACETYLMORPHINE: NEGATIVE
6-acetylmorphine: NOT DETECTED ng/mg creat

## 2016-05-03 ENCOUNTER — Ambulatory Visit (INDEPENDENT_AMBULATORY_CARE_PROVIDER_SITE_OTHER): Payer: Medicare Other | Admitting: General Practice

## 2016-05-03 DIAGNOSIS — I4891 Unspecified atrial fibrillation: Secondary | ICD-10-CM

## 2016-05-03 DIAGNOSIS — Z5181 Encounter for therapeutic drug level monitoring: Secondary | ICD-10-CM

## 2016-05-03 LAB — POCT INR: INR: 2.1

## 2016-05-05 NOTE — Progress Notes (Signed)
I have reviewed and agree with the plan. 

## 2016-05-17 ENCOUNTER — Other Ambulatory Visit: Payer: Self-pay | Admitting: Endocrinology

## 2016-05-17 ENCOUNTER — Encounter: Payer: Self-pay | Admitting: Endocrinology

## 2016-05-17 ENCOUNTER — Ambulatory Visit (INDEPENDENT_AMBULATORY_CARE_PROVIDER_SITE_OTHER): Payer: Medicare Other | Admitting: Endocrinology

## 2016-05-17 VITALS — BP 144/86 | HR 77

## 2016-05-17 DIAGNOSIS — E785 Hyperlipidemia, unspecified: Secondary | ICD-10-CM

## 2016-05-17 DIAGNOSIS — E11621 Type 2 diabetes mellitus with foot ulcer: Secondary | ICD-10-CM

## 2016-05-17 DIAGNOSIS — Z794 Long term (current) use of insulin: Secondary | ICD-10-CM

## 2016-05-17 DIAGNOSIS — R7989 Other specified abnormal findings of blood chemistry: Secondary | ICD-10-CM | POA: Diagnosis not present

## 2016-05-17 DIAGNOSIS — E1159 Type 2 diabetes mellitus with other circulatory complications: Secondary | ICD-10-CM | POA: Diagnosis not present

## 2016-05-17 DIAGNOSIS — Z125 Encounter for screening for malignant neoplasm of prostate: Secondary | ICD-10-CM

## 2016-05-17 DIAGNOSIS — L97509 Non-pressure chronic ulcer of other part of unspecified foot with unspecified severity: Secondary | ICD-10-CM | POA: Diagnosis not present

## 2016-05-17 DIAGNOSIS — K7581 Nonalcoholic steatohepatitis (NASH): Secondary | ICD-10-CM

## 2016-05-17 LAB — CBC WITH DIFFERENTIAL/PLATELET
BASOS ABS: 0 10*3/uL (ref 0.0–0.1)
Basophils Relative: 0.5 % (ref 0.0–3.0)
EOS ABS: 0.5 10*3/uL (ref 0.0–0.7)
Eosinophils Relative: 5 % (ref 0.0–5.0)
HEMATOCRIT: 33.5 % — AB (ref 39.0–52.0)
Hemoglobin: 11.1 g/dL — ABNORMAL LOW (ref 13.0–17.0)
Lymphs Abs: 0.7 10*3/uL (ref 0.7–4.0)
MCHC: 33 g/dL (ref 30.0–36.0)
MCV: 75.8 fl — AB (ref 78.0–100.0)
MONOS PCT: 6.9 % (ref 3.0–12.0)
Monocytes Absolute: 0.7 10*3/uL (ref 0.1–1.0)
Neutro Abs: 7.9 10*3/uL — ABNORMAL HIGH (ref 1.4–7.7)
Neutrophils Relative %: 80.5 % — ABNORMAL HIGH (ref 43.0–77.0)
Platelets: 343 10*3/uL (ref 150.0–400.0)
RBC: 4.42 Mil/uL (ref 4.22–5.81)
RDW: 17 % — ABNORMAL HIGH (ref 11.5–15.5)
WBC: 9.8 10*3/uL (ref 4.0–10.5)

## 2016-05-17 LAB — PSA: PSA: 1.5 ng/mL (ref 0.10–4.00)

## 2016-05-17 LAB — BASIC METABOLIC PANEL
BUN: 17 mg/dL (ref 6–23)
CHLORIDE: 102 meq/L (ref 96–112)
CO2: 35 mEq/L — ABNORMAL HIGH (ref 19–32)
Calcium: 8.7 mg/dL (ref 8.4–10.5)
Creatinine, Ser: 1.05 mg/dL (ref 0.40–1.50)
GFR: 73.94 mL/min (ref >60.00)
Glucose, Bld: 121 mg/dL — ABNORMAL HIGH (ref 70–99)
POTASSIUM: 4.2 meq/L (ref 3.5–5.1)
SODIUM: 142 meq/L (ref 135–145)

## 2016-05-17 LAB — HEPATIC FUNCTION PANEL
ALBUMIN: 3.3 g/dL — AB (ref 3.5–5.2)
ALT: 11 U/L (ref 0–53)
AST: 12 U/L (ref 0–37)
Alkaline Phosphatase: 84 U/L (ref 39–117)
Bilirubin, Direct: 0.1 mg/dL (ref 0.0–0.3)
TOTAL PROTEIN: 6.3 g/dL (ref 6.0–8.3)
Total Bilirubin: 0.3 mg/dL (ref 0.2–1.2)

## 2016-05-17 LAB — URIC ACID: URIC ACID, SERUM: 6.5 mg/dL (ref 4.0–7.8)

## 2016-05-17 LAB — LIPID PANEL
CHOL/HDL RATIO: 4
CHOLESTEROL: 112 mg/dL (ref 0–200)
HDL: 27.8 mg/dL — ABNORMAL LOW (ref >39.00)
NonHDL: 83.8
Triglycerides: 248 mg/dL — ABNORMAL HIGH (ref 0.0–149.0)
VLDL: 49.6 mg/dL — AB (ref 0.0–40.0)

## 2016-05-17 LAB — POCT GLYCOSYLATED HEMOGLOBIN (HGB A1C): Hemoglobin A1C: 7.1

## 2016-05-17 LAB — TSH: TSH: 1.14 u[IU]/mL (ref 0.35–4.50)

## 2016-05-17 LAB — LDL CHOLESTEROL, DIRECT: Direct LDL: 47 mg/dL

## 2016-05-17 NOTE — Progress Notes (Signed)
Subjective:    Patient ID: Kerin SalenJoseph L Weidner, male    DOB: August 07, 1945, 70 y.o.   MRN: [id number]  HPI  The state of at least three ongoing medical problems is addressed today, with interval history of each noted here: Dyslipidemia: he takes pravachol as rx, and tolerates well Anemia: pt cannot tolerate endoscopic w/u, due to chronic sob--unchanged Pt returns for f/u of diabetes mellitus: DM type: Insulin-requiring type 2 Dx'ed: 1990 Complications: polyneuropathy, CAD, CVA, and foot ulcer.   Therapy: insulin since 1999 DKA: never Severe hypoglycemia: never Pancreatitis: never Other: he takes multiple daily injections; he was turned down for weight-loss surgery, due to comorbidities Interval history: chronic sxs are unchanged.  He brings a record of his cbg's which I have reviewed today.  It varies from 98-200's.  There is no trend throughout the day.   Past Medical History:  Diagnosis Date  . ALLERGIC RHINITIS 07/08/2008  . BENIGN PROSTATIC HYPERTROPHY 07/08/2008  . CEREBROVASCULAR ACCIDENT, HX OF 07/08/2008  . DEGENERATIVE JOINT DISEASE 05/24/2009   R knee, end stage  . DIABETES MELLITUS, TYPE II 05/04/2007  . DIABETIC ULCER, LEFT LEG 08/23/2009  . Diarrhea 06/08/2013  . DM nephropathy/sclerosis   . Edema 10/28/2007  . 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 FATTY LIVER DISEASE 05/19/2008  . GERD 07/06/2010  . GLAUCOMA 07/08/2008  . HYPERLIPIDEMIA 07/08/2008  . HYPERTENSION 10/28/2007  . 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 Lumbar spondylosis   . Morbid obesity (HCC)   . NEPHROLITHIASIS, HX OF 07/08/2008  . OBSTRUCTIVE SLEEP APNEA 03/06/2008  . VENOUS INSUFFICIENCY 03/08/2009    Past Surgical History:  Procedure Laterality Date  . LITHOTRIPSY  2007  . TRACHEOSTOMY TUBE PLACEMENT N/A 07/14/2014   Procedure: TRACHEOSTOMY;  Surgeon: Darletta MollSui W Teoh, MD;  [facility];  Service: ENT;  Laterality: N/A;    Social History   Social History  . Marital status: Married    Spouse name: N/A  .  Number of children: N/A  . Years of education: N/A   Occupational History  . Retired Retired    Nature conservation officeroperations manager   Social History Main Topics  . Smoking status: Former Smoker    Packs/day: 0.50    Years: 15.00    Types: Cigarettes    Quit date: 06/12/1988  . Smokeless tobacco: Never Used  . Alcohol use No  . Drug use: No  . Sexual activity: Not on file   Other Topics Concern  . Not on file   Social History Narrative   Diet is "good"   Exercise is limited by medical problems    Current Outpatient Prescriptions on File Prior to Visit  Medication Sig Dispense Refill  . brimonidine (ALPHAGAN) 0.15 % ophthalmic solution Place 1 drop into both eyes 2 (two) times daily.     . chlorpheniramine (CHLOR-TRIMETON) 4 MG tablet Take 4 mg by mouth 2 (two) times daily as needed for allergies.    Marland Kitchen. dexlansoprazole (DEXILANT) 60 MG capsule Take 1 capsule (60 mg total) by mouth daily. 30 capsule 5  . diltiazem (CARDIZEM) 60 MG tablet Take 1 tablet (60 mg total) by mouth every 8 (eight) hours. 180 tablet 0  . dorzolamide-timolol (COSOPT) 22.3-6.8 MG/ML ophthalmic solution Place 1 drop into both eyes 2 (two) times daily.     . famotidine (PEPCID) 20 MG tablet TAKE 1 TABLET BY MOUTH 2 TIMES A DAY 60 tablet 11  . furosemide (LASIX) 80 MG tablet TAKE 1 TABLET BY MOUTH 2 TIMES A DAY 60 tablet  11  . glucose blood (ONETOUCH VERIO) test strip Use to check blood sugar three times daily... Dx code I48.2 100 each 5  . insulin aspart (NOVOLOG) 100 UNIT/ML FlexPen Use to inject insulin 3 times per day 30-30-15. 30 mL 4  . Insulin Glargine (LANTUS SOLOSTAR) 100 UNIT/ML Solostar Pen Inject 60 Units into the skin at bedtime. 30 mL 2  . ketoconazole (NIZORAL) 2 % shampoo Apply 1 application topically once a week.     . lactulose (CHRONULAC) 10 GM/15ML solution TAKE 45 ML'S (3 TABLESPOONFULS) (30 GM TOTAL) TWO TIMES DAILY 960 mL 11  . latanoprost (XALATAN) 0.005 % ophthalmic solution Place 1 drop into both eyes at  bedtime. 2.5 mL 4  . lidocaine (XYLOCAINE) 2 % jelly Apply 1 application topically 3 (three) times daily. As needed for pain 40 mL 11  . losartan (COZAAR) 50 MG tablet Take 1 tablet (50 mg total) by mouth daily. 30 tablet 11  . morphine (MS CONTIN) 15 MG 12 hr tablet Take 1 tablet (15 mg total) by mouth every 12 (twelve) hours. 60 tablet 0  . NOVOFINE 30G X 8 MM MISC USE 4 TIMES A DAY 120 each 11  . nystatin (MYCOSTATIN) powder Apply topically 4 (four) times daily. 120 g 5  . oxyCODONE-acetaminophen (PERCOCET) 7.5-325 MG tablet Take 1 tablet by mouth every 8 (eight) hours as needed (pain). 90 tablet 0  . OXYGEN Oxygen 3-4 lpm  Vent with sleep    . polyethylene glycol powder (GLYCOLAX/MIRALAX) powder MIX 17 GRAMS (TO LINE INSIDE LID) IN 8 TO 10 OUNCES OF LIQUID AND DRINK EVERY DAY AS PHYSICIAN INSTRUCTED 527 g 11  . pravastatin (PRAVACHOL) 40 MG tablet Take 1 tablet (40 mg total) by mouth daily at 6 PM. 90 tablet 2  . senna (SENOKOT) 8.6 MG TABS tablet Take 1 tablet (8.6 mg total) by mouth daily as needed for mild constipation. 50 each 5  . tamsulosin (FLOMAX) 0.4 MG CAPS capsule TAKE 1 CAPSULE BY MOUTH DAILY 30 capsule 11  . topiramate (TOPAMAX) 50 MG tablet TAKE 1 TABLET BY MOUTH 2 TIMES A DAY 60 tablet 5   No current facility-administered medications on file prior to visit.     Allergies  Allergen Reactions  . Neosporin [Neomycin-Polymyxin-Gramicidin] Other (See Comments)    unknown    Family History  Problem Relation Age of Onset  . Cancer Brother     Colon Cancer  . Heart disease Brother   . Heart disease Brother   . Asthma Mother     BP (!) 144/86   Pulse 77   SpO2 92% Comment: Trachea   Review of Systems He denies hypoglycemia. No change in chronic foot ulcer    Objective:   Physical Exam VITAL SIGNS:  See vs page.  GENERAL: no distress.  In wheelchair.  Has 02 on Feet: both are bandaged (sees would care).     A1c=7.1% Lab Results  Component Value Date   CHOL  112 05/17/2016   HDL 27.80 (L) 05/17/2016   LDLCALC 65 02/25/2014   LDLDIRECT 47.0 05/17/2016   TRIG 248.0 (H) 05/17/2016   CHOLHDL 4 05/17/2016       Assessment & Plan:  Insulin-requiring type 2 DM, with foot ulcer: well-controlled.  Please continue the same insulin Chronic anemia, improved.  Further w/u not indicated, given poor prognosis.  We'll follow Dyslipidemia: well-controlled, for a non-fasting specimen.   Subjective:   Patient here for Medicare annual wellness visit and management of other  chronic and acute problems.     Risk factors: advanced age    Roster of Physicians Providing Medical Care to Patient:  See "snapshot"   Activities of Daily Living: In your present state of health, do you have any difficulty performing the following activities (wife helps):  Preparing food and eating?: yes Bathing yourself: yes  Getting dressed: yes Using the toilet: no  Moving around from place to place: No  In the past year have you fallen or had a near fall?: No    Home Safety: Has smoke detector and wears seat belts. No firearms. No excess sun exposure.  Diet and Exercise  Current exercise habits: unable--wheelchair confined Dietary issues discussed: pt reports a healthy diet   Depression Screen  Q1: Over the past two weeks, have you felt down, depressed or hopeless? no  Q2: Over the past two weeks, have you felt little interest or pleasure in doing things? no   The following portions of the patient's history were reviewed and updated as appropriate: allergies, current medications, past family history, past medical history, past social history, past surgical history and problem list.   Review of Systems  Denies hearing loss, and visual loss. Objective:   Vision:  [place] opthalmologist, so he declines [facility] today.   Hearing: grossly normal Body mass index:  See vs page.  Msk: pt is unable to perform "get-up-and-go" from a sitting position Cognitive Impairment Assessment:  cognition, memory and judgment appear normal.  remembers 2/3 at 5 minutes.  excellent recall.  can easily read and write a sentence.  alert and oriented x 3.    Assessment:   Medicare wellness utd on preventive parameters.     Plan:   During the course of the visit the patient was educated and counseled about appropriate screening and preventive services including:        Fall prevention  Diabetes screening  Nutrition counseling   Vaccines / LABS Zostavax / Pneumococcal Vaccine  today  PSA  Patient Instructions (the written plan) was given to the patient.

## 2016-05-17 NOTE — Progress Notes (Signed)
we discussed code status.  pt requests DNR 

## 2016-05-17 NOTE — Patient Instructions (Addendum)
Please continue the same insulins. Please consider these measures for your health:  minimize alcohol.  Do not use tobacco products.  Have a colonoscopy at least every 10 years from age 70.  Keep firearms safely stored.  Always use seat belts.  have working smoke alarms in your home.  See an eye doctor and dentist regularly.  Never drive under the influence of alcohol or drugs (including prescription drugs).  Those with fair skin should take precautions against the sun, and should carefully examine their skin once per month, for any new or changed moles. blood tests are requested for you today.  We'll let you know about the results. It is critically important to prevent falling down (keep floor areas well-lit, dry, and free of loose objects.  If you have a cane, walker, or wheelchair, you should use it, even for short trips around the house.  Wear flat-soled shoes.  Also, try not to rush). good diet and exercise significantly improve the control of your diabetes.  please let me know if you wish to be referred to a dietician.  high blood sugar is very risky to your health.  you should see an eye doctor and dentist every year.  It is very important to get all recommended vaccinations.  Please come back for a follow-up appointment in 4 months.

## 2016-05-18 LAB — HEPATITIS C ANTIBODY: HCV AB: NEGATIVE

## 2016-05-18 LAB — HIV ANTIBODY (ROUTINE TESTING W REFLEX): HIV 1&2 Ab, 4th Generation: NONREACTIVE

## 2016-05-29 ENCOUNTER — Ambulatory Visit: Payer: Medicare Other | Admitting: Internal Medicine

## 2016-05-31 ENCOUNTER — Other Ambulatory Visit: Payer: Self-pay | Admitting: Endocrinology

## 2016-06-02 ENCOUNTER — Ambulatory Visit (INDEPENDENT_AMBULATORY_CARE_PROVIDER_SITE_OTHER): Payer: Medicare Other | Admitting: General Practice

## 2016-06-02 DIAGNOSIS — I4891 Unspecified atrial fibrillation: Secondary | ICD-10-CM

## 2016-06-02 DIAGNOSIS — Z5181 Encounter for therapeutic drug level monitoring: Secondary | ICD-10-CM | POA: Diagnosis not present

## 2016-06-02 LAB — POCT INR: INR: 2.7

## 2016-06-02 NOTE — Patient Instructions (Signed)
Pre visit review using our clinic review tool, if applicable. No additional management support is needed unless otherwise documented below in the visit note. 

## 2016-06-02 NOTE — Progress Notes (Signed)
I have reviewed and agree with the plan. 

## 2016-06-08 ENCOUNTER — Other Ambulatory Visit: Payer: Self-pay | Admitting: *Deleted

## 2016-06-08 MED ORDER — DILTIAZEM HCL 60 MG PO TABS: 60.0000 mg | ORAL_TABLET | Freq: Three times a day (TID) | ORAL | 0 refills | Status: DC

## 2016-06-14 ENCOUNTER — Encounter: Payer: Medicare Other | Attending: Registered Nurse | Admitting: Registered Nurse

## 2016-06-14 ENCOUNTER — Encounter: Payer: Self-pay | Admitting: Registered Nurse

## 2016-06-14 VITALS — BP 99/58 | HR 83 | Resp 16

## 2016-06-14 DIAGNOSIS — Z79899 Other long term (current) drug therapy: Secondary | ICD-10-CM

## 2016-06-14 DIAGNOSIS — Z5181 Encounter for therapeutic drug level monitoring: Secondary | ICD-10-CM

## 2016-06-14 DIAGNOSIS — J9620 Acute and chronic respiratory failure, unspecified whether with hypoxia or hypercapnia: Secondary | ICD-10-CM | POA: Insufficient documentation

## 2016-06-14 DIAGNOSIS — Z760 Encounter for issue of repeat prescription: Secondary | ICD-10-CM | POA: Insufficient documentation

## 2016-06-14 DIAGNOSIS — T40605A Adverse effect of unspecified narcotics, initial encounter: Secondary | ICD-10-CM | POA: Diagnosis not present

## 2016-06-14 DIAGNOSIS — Z930 Tracheostomy status: Secondary | ICD-10-CM | POA: Diagnosis not present

## 2016-06-14 DIAGNOSIS — K5909 Other constipation: Secondary | ICD-10-CM | POA: Diagnosis not present

## 2016-06-14 DIAGNOSIS — M25561 Pain in right knee: Secondary | ICD-10-CM | POA: Diagnosis not present

## 2016-06-14 DIAGNOSIS — G609 Hereditary and idiopathic neuropathy, unspecified: Secondary | ICD-10-CM

## 2016-06-14 DIAGNOSIS — E662 Morbid (severe) obesity with alveolar hypoventilation: Secondary | ICD-10-CM | POA: Diagnosis not present

## 2016-06-14 DIAGNOSIS — G8929 Other chronic pain: Secondary | ICD-10-CM | POA: Diagnosis not present

## 2016-06-14 DIAGNOSIS — M1711 Unilateral primary osteoarthritis, right knee: Secondary | ICD-10-CM

## 2016-06-14 DIAGNOSIS — M179 Osteoarthritis of knee, unspecified: Secondary | ICD-10-CM | POA: Diagnosis not present

## 2016-06-14 DIAGNOSIS — M545 Low back pain, unspecified: Secondary | ICD-10-CM

## 2016-06-14 DIAGNOSIS — M1712 Unilateral primary osteoarthritis, left knee: Secondary | ICD-10-CM

## 2016-06-14 MED ORDER — OXYCODONE-ACETAMINOPHEN 7.5-325 MG PO TABS: 1.0000 | ORAL_TABLET | Freq: Three times a day (TID) | ORAL | 0 refills | Status: DC | PRN

## 2016-06-14 MED ORDER — MORPHINE SULFATE ER 15 MG PO TBCR: 15.0000 mg | EXTENDED_RELEASE_TABLET | Freq: Two times a day (BID) | ORAL | 0 refills | Status: DC

## 2016-06-14 NOTE — Progress Notes (Signed)
Subjective:    Patient ID: Blake SalenJoseph L Bosch, male    DOB: 23-Oct-1945, 71 y.o.   MRN: [id number]  HPI:  Blake Summers is a 71 year old male who returns for follow up appointment for chronic pain and medication refill. He states his pain is located in his lower back and right knee.He rates his pain 7. His current exercise regime is performing stretching exercises with bands, light weights and walking with walker short distances in the home. He arrived to clinic in his motorized wheelchair. Wearing bilateral lower extremities dressing, he's going to the Wound Center Weekly.  Blake Summers bilateral lower extremities wounds have an odor, he's scheduled for wound center appointment in the morning. His wife changes the dressing bi-weekly and wound care weekly. Discuss the above with Blake Summers regarding his wounds, they will speak to the wound center they state in the morning. Dressings intact with Velcro Wrap.   Also [facility] decision regarding trach.   Wife in room all questions answered.  Pain Inventory Average Pain 7 Pain Right Now 7 My pain is sharp, burning, stabbing and aching  In the last 24 hours, has pain interfered with the following? General activity 8 Relation with others 8 Enjoyment of life 8 What TIME of day is your pain at its worst? daytime Sleep (in general) Poor  Pain is worse with: walking and standing Pain improves with: rest, medication and injections Relief from Meds: 7  Mobility use a walker how many minutes can you walk? 0 ability to climb steps?  no do you drive?  yes use a wheelchair transfers alone  Function retired I need assistance with the following:  dressing, bathing, meal prep, household duties and shopping  Neuro/Psych weakness trouble walking dizziness  Prior Studies Any changes since last visit?  no  Physicians involved in your care Any changes since last visit?   no   Family History  Problem Relation Age of Onset  . Cancer Brother     Colon Cancer  . Heart disease Brother   . Heart disease Brother   . Asthma Mother    Social History   Social History  . Marital status: Married    Spouse name: N/A  . Number of children: N/A  . Years of education: N/A   Occupational History  . Retired Retired    Nature conservation officeroperations manager   Social History Main Topics  . Smoking status: Former Smoker    Packs/day: 0.50    Years: 15.00    Types: Cigarettes    Quit date: 06/12/1988  . Smokeless tobacco: Never Used  . Alcohol use No  . Drug use: No  . Sexual activity: Not Asked   Other Topics Concern  . None   Social History Narrative   Diet is "good"   Exercise is limited by medical problems   Past Surgical History:  Procedure Laterality Date  . LITHOTRIPSY  2007  . TRACHEOSTOMY TUBE PLACEMENT N/A 07/14/2014   Procedure: TRACHEOSTOMY;  Surgeon: Darletta MollSui W Teoh, MD;  [facility];  Service: ENT;  Laterality: N/A;   Past Medical History:  Diagnosis Date  . ALLERGIC RHINITIS 07/08/2008  . BENIGN PROSTATIC HYPERTROPHY 07/08/2008  . CEREBROVASCULAR ACCIDENT, HX OF 07/08/2008  . DEGENERATIVE JOINT DISEASE 05/24/2009   R knee, end stage  . DIABETES MELLITUS, TYPE II 05/04/2007  . DIABETIC ULCER, LEFT LEG 08/23/2009  . Diarrhea 06/08/2013  .  DM nephropathy/sclerosis   . Edema 10/28/2007  . 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 FATTY LIVER DISEASE 05/19/2008  . GERD 07/06/2010  . GLAUCOMA 07/08/2008  . HYPERLIPIDEMIA 07/08/2008  . HYPERTENSION 10/28/2007  . 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 Lumbar spondylosis   . Morbid obesity (HCC)   . NEPHROLITHIASIS, HX OF 07/08/2008  . OBSTRUCTIVE SLEEP APNEA 03/06/2008  . VENOUS INSUFFICIENCY 03/08/2009   BP (!) 99/58   Pulse 83   Resp 16   SpO2 93%   Opioid Risk Score:   Fall Risk Score:  `1  Depression screen PHQ 2/9  Depression screen [facility] 2/9 02/23/2016 06/28/2015 02/16/2015 02/04/2015 12/29/2014 12/08/2014 10/20/2014   Decreased Interest 0 0 0 0 0 0 0  Down, Depressed, Hopeless 0 0 0 0 0 0 0  PHQ - 2 Score 0 0 0 0 0 0 0  Altered sleeping - - - - - - 0  Tired, decreased energy - - - - - - 2  Change in appetite - - - - - - 0  Feeling bad or failure about yourself  - - - - - - 0  Trouble concentrating - - - - - - 0  Moving slowly or fidgety/restless - - - - - - 0  Suicidal thoughts - - - - - - 0  PHQ-9 Score - - - - - - 2  Some recent data might be hidden     Review of Systems  Constitutional: Negative.   HENT: Negative.   Eyes: Negative.   Respiratory: Negative.   Cardiovascular: Negative.   Gastrointestinal: Negative.   Endocrine: Negative.   Genitourinary: Negative.   Musculoskeletal: Negative.   Skin: Negative.   Allergic/Immunologic: Negative.   Neurological: Negative.   Hematological: Negative.   Psychiatric/Behavioral: Negative.   All other systems reviewed and are negative.      Objective:   Physical Exam  Constitutional: He is oriented to person, place, and time. He appears well-developed and well-nourished.  Morbid Obesity  HENT:  Head: Normocephalic and atraumatic.  Neck: Normal range of motion. Neck supple.  Cardiovascular: Normal rate and regular rhythm.   Pulmonary/Chest: Effort normal and breath sounds normal.  Continuous Oxygen 3 liters nasal cannula Trach/ PMV  Musculoskeletal:  Normal Muscle Bulk and Muscle Testing Reveals: Upper Extremities: Full ROM and Muscle Strength 5/5 Lumbar Paraspinal Tenderness Noted Lower Extremities: Bilateral Dressing Intact with Velcro Wraps Arrived in Motorized Wheelchair  Neurological: He is alert and oriented to person, place, and time.  Skin: Skin is warm and dry.  Psychiatric: He has a normal mood and affect.  Nursing note and vitals reviewed.         Assessment & Plan:  1. End-stage right knee osteoarthritis: Continue to use Lidocaine gel. Refilled: MS contin 15 mg #60. One tablet every 12 hours and Percocet 7.5/325  mg # 90 pills--use every 8 hours as needed for pain #90. Second script's for the following month. 2. Morbid obesity with generalized deconditioning:  Continue to monitor 3. Chronic low back pain/spondylosis: Continue MS contin with prn percocet.  4. Narcotic induced constipation: Continue with Bowel Program: Senna and Miralax as prescribed. 5. Acute on Chronic Respiratory Failure: Continuous Oxygen: S/P Tracheostomy/ PMV/ Vent at HS. Pulmonary Following 6. Peripheral Neuropathy: Continue Topamax  30 minutes of face to face patient care time was spent during this visit. All questions were encouraged and answered.   F/U in 2 months

## 2016-06-26 ENCOUNTER — Encounter: Payer: Self-pay | Admitting: *Deleted

## 2016-06-28 ENCOUNTER — Ambulatory Visit: Payer: Medicare Other | Admitting: Internal Medicine

## 2016-06-29 ENCOUNTER — Telehealth: Payer: Self-pay | Admitting: Registered Nurse

## 2016-06-29 ENCOUNTER — Ambulatory Visit: Payer: Medicare Other | Admitting: Internal Medicine

## 2016-06-29 NOTE — Telephone Encounter (Signed)
On January 18,2018 NCCSR was reviewed: No conflict was seen on the [place] Cont[place] Sub[place] Reporting System with Multiple Prescribers. [name] has a signed  Narcotic Contract with our office. If there were any discrepancies this would have  reported to his  Physcian.

## 2016-06-30 ENCOUNTER — Ambulatory Visit: Payer: Medicare Other

## 2016-07-01 IMAGING — CR DG ABD PORTABLE 1V
1 series · 1 of 1 positions shown · non-contrast
Comparison: 07/12/2014 and earlier.

CLINICAL DATA: 68-year-old male new NG tube placement. Initial
encounter.

EXAM:
PORTABLE ABDOMEN - 1 VIEW

[supine ap]
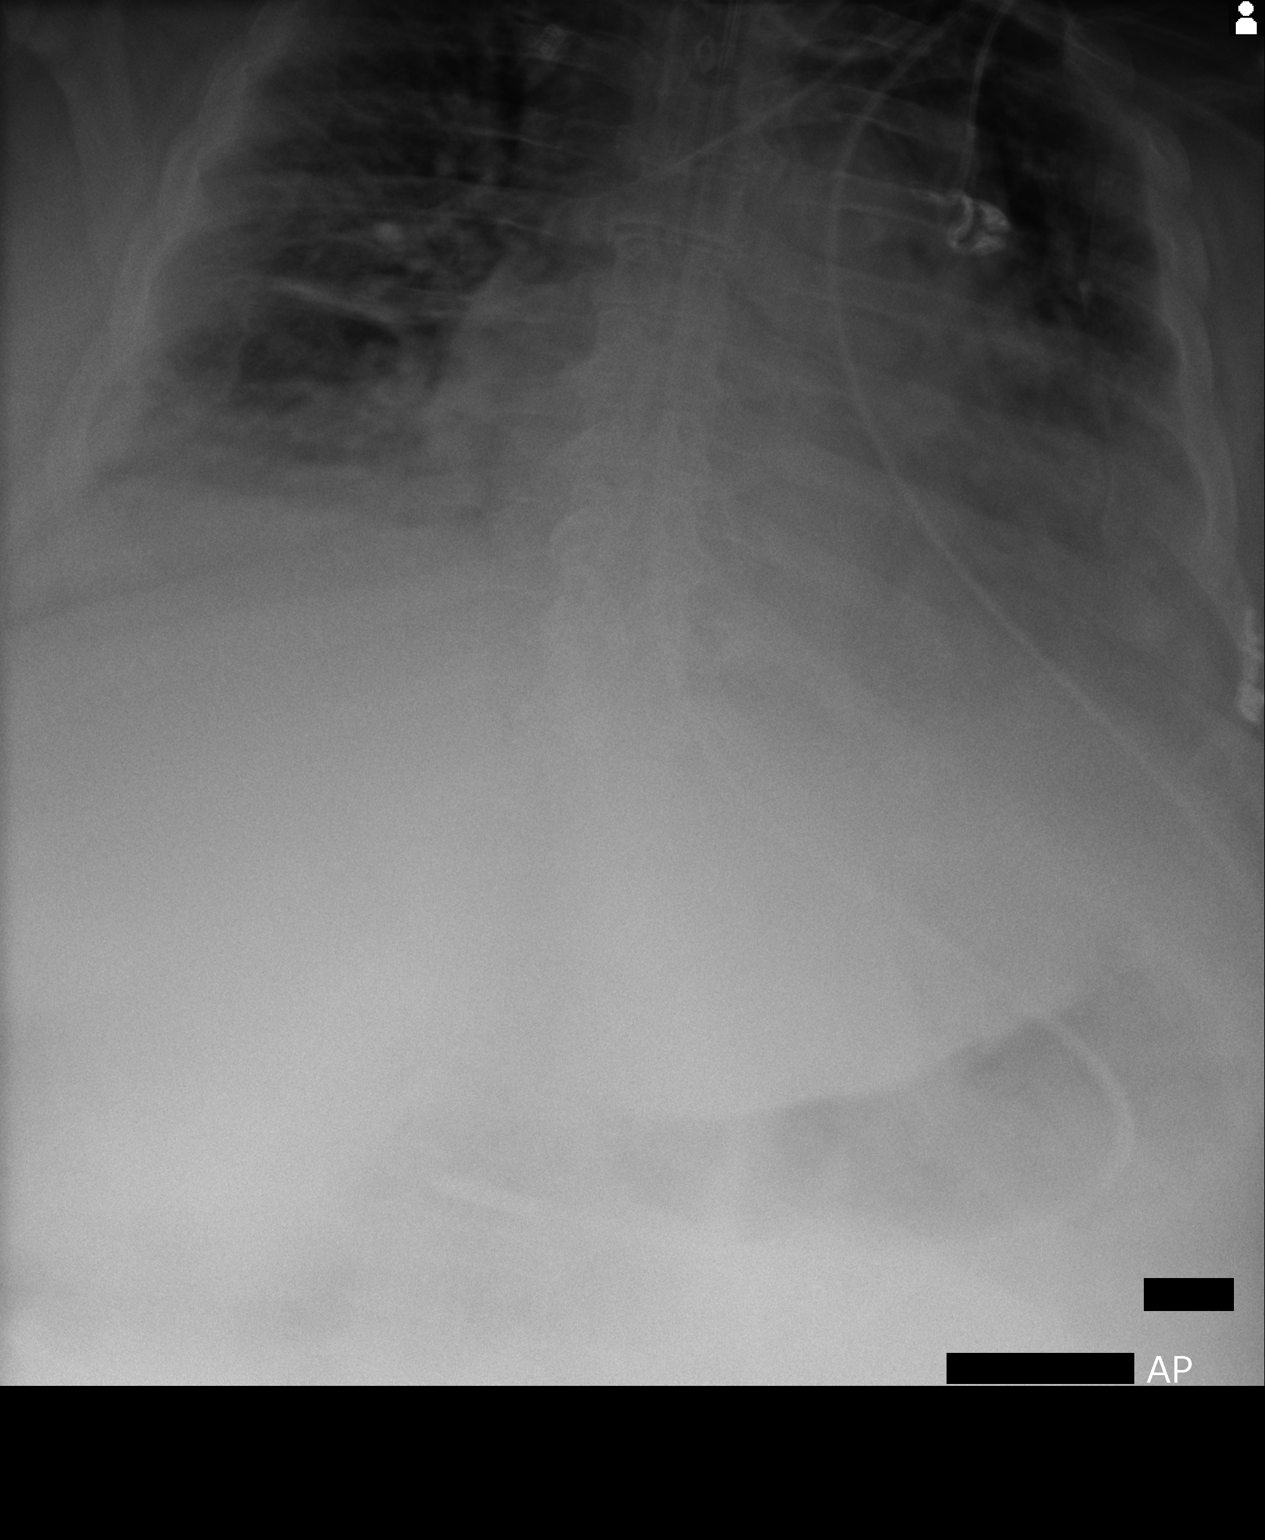

[1 of 1 positions shown; findings below may reference images not displayed]

FINDINGS: Portable AP upright view at 0118 hrs. And enteric tube is in place
to the left upper quadrant and in courses to the midline projecting
at the level of the distal stomach over The spine.
IMPRESSION: Enteric tube courses to the left upper quadrant and terminates at
the level of the distal stomach.

## 2016-07-05 ENCOUNTER — Other Ambulatory Visit (INDEPENDENT_AMBULATORY_CARE_PROVIDER_SITE_OTHER): Payer: Medicare Other

## 2016-07-05 ENCOUNTER — Ambulatory Visit (INDEPENDENT_AMBULATORY_CARE_PROVIDER_SITE_OTHER): Payer: Medicare Other | Admitting: Internal Medicine

## 2016-07-05 ENCOUNTER — Encounter: Payer: Self-pay | Admitting: Internal Medicine

## 2016-07-05 VITALS — BP 140/68 | HR 62 | Ht 66.0 in | Wt 318.4 lb

## 2016-07-05 DIAGNOSIS — D509 Iron deficiency anemia, unspecified: Secondary | ICD-10-CM | POA: Diagnosis not present

## 2016-07-05 DIAGNOSIS — J9612 Chronic respiratory failure with hypercapnia: Secondary | ICD-10-CM | POA: Diagnosis not present

## 2016-07-05 DIAGNOSIS — J9611 Chronic respiratory failure with hypoxia: Secondary | ICD-10-CM | POA: Diagnosis not present

## 2016-07-05 LAB — CBC WITH DIFFERENTIAL/PLATELET
BASOS ABS: 0 10*3/uL (ref 0.0–0.1)
BASOS PCT: 0.3 % (ref 0.0–3.0)
EOS ABS: 0.5 10*3/uL (ref 0.0–0.7)
Eosinophils Relative: 5 % (ref 0.0–5.0)
HCT: 34 % — ABNORMAL LOW (ref 39.0–52.0)
HEMOGLOBIN: 11.1 g/dL — AB (ref 13.0–17.0)
LYMPHS PCT: 7.4 % — AB (ref 12.0–46.0)
Lymphs Abs: 0.7 10*3/uL (ref 0.7–4.0)
MCHC: 32.8 g/dL (ref 30.0–36.0)
MCV: 74.3 fl — ABNORMAL LOW (ref 78.0–100.0)
MONO ABS: 0.8 10*3/uL (ref 0.1–1.0)
Monocytes Relative: 8.8 % (ref 3.0–12.0)
NEUTROS ABS: 7.4 10*3/uL (ref 1.4–7.7)
Neutrophils Relative %: 78.5 % — ABNORMAL HIGH (ref 43.0–77.0)
PLATELETS: 316 10*3/uL (ref 150.0–400.0)
RBC: 4.57 Mil/uL (ref 4.22–5.81)
RDW: 17 % — AB (ref 11.5–15.5)
WBC: 9.5 10*3/uL (ref 4.0–10.5)

## 2016-07-05 LAB — BASIC METABOLIC PANEL
BUN: 17 mg/dL (ref 6–23)
CALCIUM: 8.7 mg/dL (ref 8.4–10.5)
CHLORIDE: 103 meq/L (ref 96–112)
CO2: 33 mEq/L — ABNORMAL HIGH (ref 19–32)
CREATININE: 1.05 mg/dL (ref 0.40–1.50)
GFR: 73.91 mL/min (ref >60.00)
Glucose, Bld: 89 mg/dL (ref 70–99)
Potassium: 3.5 mEq/L (ref 3.5–5.1)
Sodium: 142 mEq/L (ref 135–145)

## 2016-07-05 NOTE — Progress Notes (Signed)
Subjective:     Patient ID: Blake Summers, male   DOB: 1946/01/07, .   MRN: [id number]     Brief patient profile:  69 yowm OHS  last doing well in May 2016 sleeping 45 degreees HOB on home vent and 02 and during  Day just 02 3lpm and pmv   History of Present Illness  01/04/2015 1st [place]   office visit/ Blake Summers   Chief Complaint  Patient presents with  . Follow-up    Former pt of Dr Shelle Iron. Pt c/o increased SOB with exertion- relates to hot weather.   cc worse sob since May 2016  lots of nasal drainage with more suctioning required > yellowish,  occ bloody says due to hot weather but really doesn't go out much and lives in a/c.  Not as comfortable on vent  rec I will have advanced adjust your ventilator if needed  Ok to use the ventilator during the day  For drainage take chlortrimeton (chlorpheniramine) 4 mg every 4 hours available over the counter (may cause drowsiness)  And Pepcid 20 mg at bedime If not better add prilosec 20 mg Take 30-60 min before first meal of the day  GERD diet  Please schedule a follow up visit in 3 months but call sooner if needed  Late add :  ? pna > rx Augmentin 875 mg take one pill twice daily  X 10 days   Admit [facility] dx hypercarbia/ ? narc overuse > placed back on vent and did fine per discharge summary         12/22/2015  f/u ov/Blake Summers re: chronic resp falure/ ohs/ vent dep hs only / no resp rx x 3lpm at home/ 4 lpm with activity  Chief Complaint  Patient presents with  . Follow-up  walking across the house s 02 eg to bathroom but does not check 02 then/ wants POC rec Ok to try off the vent a few nights a week unless more groggy / headache in am or less energy during the day but make sure your elevation is the same or higher  Keep working on the weight     07/05/2016  f/u ov/Blake Summers re:  Off vent since xmas 2017  Chief Complaint  Patient presents with  . Follow-up    Follows for Chronic Resp Fail. Pt states he has been following instructions from  last visit and has been doing real well. Pt denies SOB, cough, chest tightness.    no am ha/ no problem with am mentation or sob noct off vent/ on trach with ? Ok to replace with cuffless per ent     No obvious patterns in  day to day or daytime variability or assoc  cp or chest tightness, subjective wheeze or overt sinus or hb symptoms. No unusual exp hx or h/o childhood pna/ asthma or knowledge of premature birth.  Sleeping ok without nocturnal  or early am exacerbation  of respiratory  c/o's or need for noct saba. Also denies any obvious fluctuation of symptoms with weather or environmental changes or other aggravating or alleviating factors except as outlined above   Current Medications, Allergies, Complete Past Medical History, Past Surgical History, Family History, and Social History were reviewed in [employer] record.  ROS  The following are not active complaints unless bolded sore throat, dysphagia, dental problems, itching, sneezing,  nasal congestion or excess/ purulent secretions, ear ache,   fever, chills, sweats, unintended wt loss, classically pleuritic or exertional cp, hemoptysis,  orthopnea pnd or leg swelling, presyncope, palpitations, abdominal pain, anorexia, nausea, vomiting, diarrhea  or change in bowel or bladder habits, change in stools or urine, dysuria,hematuria,  rash, arthralgias, visual complaints, headache, numbness, weakness or ataxia or problems with walking or coordination,  change in mood/affect or memory.            Objective:  Physical Exam   Obese  W/c bound massively obese Wm nad on T collar plugged/ nasal 02   03/05/2015        304  > 06/04/2015   298 > 09/22/2015  307 >  12/22/2015   320 > 07/05/2016   318     01/04/15 322 lb (146.058 kg)  12/08/14 310 lb (140.615 kg)  11/12/14 302 lb (136.986 kg)    Vital signs reviewed  - Note on arrival 02 sats  98% on 3lpm       HEENT: nl dentition, turbinates, and orophanx. Nl external  ear canals without cough reflex   NECK :  without JVD/Nodes/TM/ nl carotid upstrokes bilaterally/ trach with pmv in place    LUNGS: no acc muscle use, clear to A and P bilaterally without cough on insp or exp maneuvers   CV:  RRR  no s3 or murmur or increase in P2, no edema   ABD:  soft and nontender with nl excursion in the supine position. No bruits or organomegaly, bowel sounds nl  MS:  warm without deformities, calf tenderness, cyanosis or clubbing  SKIN: warm and dry without lesions    NEURO:  alert, approp, no deficits    Labs ordered/ reviewed:   Chemistry      Component Value Date/Time   NA 142 07/05/2016 1503   K 3.5 07/05/2016 1503   CL 103 07/05/2016 1503   CO2 33 (H) 07/05/2016 1503   BUN 17 07/05/2016 1503   CREATININE 1.05 07/05/2016 1503   CREATININE 0.91 09/06/2015 1541      Component Value Date/Time   CALCIUM 8.7 07/05/2016 1503   CALCIUM 8.6 07/28/2011 1106   ALKPHOS 84 05/17/2016 1426   AST 12 05/17/2016 1426   ALT 11 05/17/2016 1426   BILITOT 0.3 05/17/2016 1426       Lab Results  Component Value Date   WBC 9.5 07/05/2016   HGB 11.1 (L) 07/05/2016   HCT 34.0 (L) 07/05/2016   MCV 74.3 (L) 07/05/2016   PLT 316.0 07/05/2016      Lab Results  Component Value Date   HGB 11.1 (L) 07/05/2016   HGB 11.1 (L) 05/17/2016   HGB 10.9 (L) 09/22/2015                   Assessment:

## 2016-07-05 NOTE — Patient Instructions (Addendum)
Please remember to go to the lab department downstairs for your tests - we will call you with the results when they are available.     Please schedule a follow up visit in 3 months but [facility]  - late add: inquire re anemia f/u

## 2016-07-06 ENCOUNTER — Telehealth: Payer: Self-pay

## 2016-07-06 ENCOUNTER — Telehealth: Payer: Self-pay | Admitting: *Deleted

## 2016-07-06 DIAGNOSIS — D509 Iron deficiency anemia, unspecified: Secondary | ICD-10-CM | POA: Insufficient documentation

## 2016-07-06 NOTE — Progress Notes (Signed)
See PN dated 07/06/16

## 2016-07-06 NOTE — Telephone Encounter (Signed)
-----   Message from Nyoka CowdenMichael B Wert, MD sent at 07/06/2016  6:54 AM EST ----- Find out if aware re anemia and has f/u with PC or GI to address

## 2016-07-06 NOTE — Telephone Encounter (Signed)
Spoke with the pt and notified of his lab results  He reports that he can not recall being followed by anyone regarding his anemia  He sees Dr Everardo AllEllison  I advised him he will need to f/u with him on this  He verbalized understanding  I have faxed copy of labs to Dr Everardo AllEllison

## 2016-07-06 NOTE — Assessment & Plan Note (Signed)
Complicated by HBP/ Hyperlipidemia/ DM / OHS  Body mass index is 51.39 kg/m.  Lab Results  Component Value Date   TSH 1.14 05/17/2016     Contributing to gerd tendency/ doe/reviewed the need and the process to achieve and maintain neg calorie balance > defer f/u primary care including intermittently monitoring thyroid status

## 2016-07-06 NOTE — Telephone Encounter (Signed)
On July 06, 2016 NCCSR was reviewed. No conflict was seen on the [facility] [place] Cont[place] Substance Reporting System with multiple prescribers. Blake SalenJoseph L Summers has signed a Narcotic Contract with our office. If there are any discrepancies this will be reported to his Physician.

## 2016-07-06 NOTE — Assessment & Plan Note (Signed)
Appears to be a chronic problem though always concerned about occult GIB/ malignancy in this setting > defer w/u to PC/ GI / pt informed

## 2016-07-06 NOTE — Assessment & Plan Note (Signed)
Prior Vent settings: S9 VPAP 14/10 [facility] Admit 06/2014 with worsening RF, required trach and nocturnal vent, required [facility] until 10/08/14. 3 liters pulsed exertion and rest, 3 liters with vpap at bedtime. Per last entry 01/2014 but since then was admitted/ dc from [facility]  - Trach  per Suszanne Connerseoh 07/14/14  - HC03 35  On  01/04/15 despite noct vent  On nocturnal vent as of 01/05/15 : [id number] Settings SIMV/VC, Rate=6, PEEP=+5, PSV=12, VT=500, with 3LPM O2 - HC03  05/31/15 = 39  Down to 32 09/06/15 c/w improving hypercarbia - Req POC 12/22/2015 >>>  - HC03  =   07/05/2016  = 33 off vent x one month    Stopped using vent because could not tol settings but so far tolerating off s sob or increase in bicarb levles or am ha/ams so ok to leave off but would like him to f/u with Dr DeDios to troubleshoot his home vent issues as likely to be a problem in the future.

## 2016-07-06 NOTE — Telephone Encounter (Signed)
Please send pt hemoccults, and advise him we are sending.

## 2016-07-07 ENCOUNTER — Ambulatory Visit (INDEPENDENT_AMBULATORY_CARE_PROVIDER_SITE_OTHER): Payer: Medicare Other | Admitting: General Practice

## 2016-07-07 DIAGNOSIS — Z5181 Encounter for therapeutic drug level monitoring: Secondary | ICD-10-CM

## 2016-07-07 DIAGNOSIS — I4891 Unspecified atrial fibrillation: Secondary | ICD-10-CM

## 2016-07-07 LAB — POCT INR: INR: 2.5

## 2016-07-07 NOTE — Telephone Encounter (Signed)
Attempted to reach the patient. Patient was not available, will try again at a later time.  

## 2016-07-07 NOTE — Patient Instructions (Signed)
Pre visit review using our clinic review tool, if applicable. No additional management support is needed unless otherwise documented below in the visit note. 

## 2016-07-07 NOTE — Telephone Encounter (Signed)
Attempted to reach the patient. Patient was not available and did not have a working voicemail. Will try again at a later time.  

## 2016-07-08 ENCOUNTER — Other Ambulatory Visit: Payer: Self-pay | Admitting: Endocrinology

## 2016-07-08 ENCOUNTER — Other Ambulatory Visit: Payer: Self-pay | Admitting: Internal Medicine

## 2016-07-08 DIAGNOSIS — E1065 Type 1 diabetes mellitus with hyperglycemia: Principal | ICD-10-CM

## 2016-07-08 DIAGNOSIS — M109 Gout, unspecified: Secondary | ICD-10-CM

## 2016-07-08 DIAGNOSIS — IMO0002 Reserved for concepts with insufficient information to code with codable children: Secondary | ICD-10-CM

## 2016-07-08 DIAGNOSIS — E1040 Type 1 diabetes mellitus with diabetic neuropathy, unspecified: Secondary | ICD-10-CM

## 2016-07-10 ENCOUNTER — Ambulatory Visit: Payer: Medicare Other | Admitting: Internal Medicine

## 2016-07-10 NOTE — Telephone Encounter (Signed)
I contacted the patient and advised of message. Patient voiced understanding and hemoccult cards sent.

## 2016-07-21 ENCOUNTER — Ambulatory Visit: Payer: Medicare Other

## 2016-08-07 ENCOUNTER — Other Ambulatory Visit: Payer: Self-pay | Admitting: Internal Medicine

## 2016-08-07 MED ORDER — DILTIAZEM HCL 60 MG PO TABS: 60.0000 mg | ORAL_TABLET | Freq: Three times a day (TID) | ORAL | 0 refills | Status: DC

## 2016-08-09 ENCOUNTER — Other Ambulatory Visit: Payer: Self-pay | Admitting: Internal Medicine

## 2016-08-10 ENCOUNTER — Other Ambulatory Visit: Payer: Self-pay | Admitting: Internal Medicine

## 2016-08-18 ENCOUNTER — Ambulatory Visit: Payer: Medicare Other | Admitting: General Practice

## 2016-08-18 DIAGNOSIS — I4891 Unspecified atrial fibrillation: Secondary | ICD-10-CM

## 2016-08-18 DIAGNOSIS — Z5181 Encounter for therapeutic drug level monitoring: Secondary | ICD-10-CM

## 2016-08-18 LAB — POCT INR: INR: 1.4

## 2016-08-21 ENCOUNTER — Other Ambulatory Visit: Payer: Self-pay | Admitting: Physical Medicine & Rehabilitation

## 2016-08-23 ENCOUNTER — Encounter: Payer: Medicare Other | Attending: Registered Nurse | Admitting: Registered Nurse

## 2016-08-23 ENCOUNTER — Encounter: Payer: Self-pay | Admitting: Registered Nurse

## 2016-08-23 VITALS — BP 108/49 | HR 62

## 2016-08-23 DIAGNOSIS — M545 Low back pain, unspecified: Secondary | ICD-10-CM

## 2016-08-23 DIAGNOSIS — M1711 Unilateral primary osteoarthritis, right knee: Secondary | ICD-10-CM

## 2016-08-23 DIAGNOSIS — Z79899 Other long term (current) drug therapy: Secondary | ICD-10-CM

## 2016-08-23 DIAGNOSIS — M1712 Unilateral primary osteoarthritis, left knee: Secondary | ICD-10-CM

## 2016-08-23 DIAGNOSIS — M25561 Pain in right knee: Secondary | ICD-10-CM | POA: Diagnosis not present

## 2016-08-23 DIAGNOSIS — Z930 Tracheostomy status: Secondary | ICD-10-CM | POA: Insufficient documentation

## 2016-08-23 DIAGNOSIS — G609 Hereditary and idiopathic neuropathy, unspecified: Secondary | ICD-10-CM | POA: Diagnosis not present

## 2016-08-23 DIAGNOSIS — Z760 Encounter for issue of repeat prescription: Secondary | ICD-10-CM | POA: Diagnosis not present

## 2016-08-23 DIAGNOSIS — M179 Osteoarthritis of knee, unspecified: Secondary | ICD-10-CM | POA: Diagnosis not present

## 2016-08-23 DIAGNOSIS — J9620 Acute and chronic respiratory failure, unspecified whether with hypoxia or hypercapnia: Secondary | ICD-10-CM | POA: Diagnosis not present

## 2016-08-23 DIAGNOSIS — T40605A Adverse effect of unspecified narcotics, initial encounter: Secondary | ICD-10-CM | POA: Diagnosis not present

## 2016-08-23 DIAGNOSIS — Z5181 Encounter for therapeutic drug level monitoring: Secondary | ICD-10-CM | POA: Diagnosis not present

## 2016-08-23 DIAGNOSIS — G894 Chronic pain syndrome: Secondary | ICD-10-CM | POA: Diagnosis not present

## 2016-08-23 DIAGNOSIS — G8929 Other chronic pain: Secondary | ICD-10-CM | POA: Diagnosis present

## 2016-08-23 DIAGNOSIS — K5909 Other constipation: Secondary | ICD-10-CM | POA: Diagnosis not present

## 2016-08-23 DIAGNOSIS — E662 Morbid (severe) obesity with alveolar hypoventilation: Secondary | ICD-10-CM

## 2016-08-23 MED ORDER — OXYCODONE-ACETAMINOPHEN 7.5-325 MG PO TABS: 1.0000 | ORAL_TABLET | Freq: Three times a day (TID) | ORAL | 0 refills | Status: DC | PRN

## 2016-08-23 MED ORDER — MORPHINE SULFATE ER 15 MG PO TBCR: 15.0000 mg | EXTENDED_RELEASE_TABLET | Freq: Two times a day (BID) | ORAL | 0 refills | Status: DC

## 2016-08-23 NOTE — Progress Notes (Addendum)
Subjective:    Patient ID: Blake Summers, male    DOB: March 10, 1946, 71 y.o.   MRN: [id number]  HPI: Mr. Blake SalenJoseph L Schlatter is a 71year old male who returns for follow up appointment for chronic pain and medication refill. He states his pain is located in his lower back and right knee.He rates his pain 7. His current exercise regime is performing stretching exercises with bands and walking with walker short distances in the home. He arrived to clinic in his motorized wheelchair. Wearing bilateral lower extremities dressing, [facility].  Wife in room.   Pain Inventory Average Pain 7 Pain Right Now 7 My pain is sharp, stabbing, tingling and aching  In the last 24 hours, has pain interfered with the following? General activity 7 Relation with others 8 Enjoyment of life 8 What TIME of day is your pain at its worst? daytime Sleep (in general) Poor  Pain is worse with: walking and standing Pain improves with: rest, heat/ice, medication and injections Relief from Meds: 7  Mobility walk with assistance use a cane use a walker how many minutes can you walk? 0 ability to climb steps?  no do you drive?  yes use a wheelchair needs help with transfers transfers alone  Function retired I need assistance with the following:  dressing, bathing, toileting, meal prep, household duties and shopping  Neuro/Psych weakness numbness tingling trouble walking dizziness  Prior Studies Any changes since last visit?  no  Physicians involved in your care Any changes since last visit?  no   Family History  Problem Relation Age of Onset  . Asthma Mother   . Colon cancer Brother   . Heart disease Brother   . Heart disease Brother    Social History   Social History  . Marital status: Married    Spouse name: N/A  . Number of children: N/A  . Years of education: N/A   Occupational History  . Retired Retired    Nature conservation officeroperations manager   Social History  Main Topics  . Smoking status: Former Smoker    Packs/day: 0.50    Years: 15.00    Types: Cigarettes    Quit date: 06/12/1988  . Smokeless tobacco: Never Used  . Alcohol use No  . Drug use: No  . Sexual activity: Not Asked   Other Topics Concern  . None   Social History Narrative   Diet is "good"   Exercise is limited by medical problems   Past Surgical History:  Procedure Laterality Date  . LITHOTRIPSY  2007  . TRACHEOSTOMY TUBE PLACEMENT N/A 07/14/2014   Procedure: TRACHEOSTOMY;  Surgeon: Darletta MollSui W Teoh, MD;  [facility];  Service: ENT;  Laterality: N/A;   Past Medical History:  Diagnosis Date  . ALLERGIC RHINITIS 07/08/2008  . BENIGN PROSTATIC HYPERTROPHY 07/08/2008  . CEREBROVASCULAR ACCIDENT, HX OF 07/08/2008  . DEGENERATIVE JOINT DISEASE 05/24/2009   R knee, end stage  . DIABETES MELLITUS, TYPE II 05/04/2007  . DIABETIC ULCER, LEFT LEG 08/23/2009  . Diarrhea 06/08/2013  . DM nephropathy/sclerosis   . Edema 10/28/2007  . 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 FATTY LIVER DISEASE 05/19/2008  . GERD 07/06/2010  . GLAUCOMA 07/08/2008  . HYPERLIPIDEMIA 07/08/2008  . HYPERTENSION 10/28/2007  . 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 Lumbar spondylosis   . Morbid obesity (HCC)   . NEPHROLITHIASIS, HX OF 07/08/2008  . OBSTRUCTIVE SLEEP APNEA 03/06/2008  . VENOUS INSUFFICIENCY 03/08/2009   BP (!) 108/49 (  BP Location: Right Arm, Patient Position: Sitting, Cuff Size: Large)   Pulse 62   SpO2 95%   Opioid Risk Score:   Fall Risk Score:  `1  Depression screen PHQ 2/9  Depression screen [facility] 2/9 02/23/2016 06/28/2015 02/16/2015 02/04/2015 12/29/2014 12/08/2014 10/20/2014  Decreased Interest 0 0 0 0 0 0 0  Down, Depressed, Hopeless 0 0 0 0 0 0 0  PHQ - 2 Score 0 0 0 0 0 0 0  Altered sleeping - - - - - - 0  Tired, decreased energy - - - - - - 2  Change in appetite - - - - - - 0  Feeling bad or failure about yourself  - - - - - - 0  Trouble concentrating - - - - - - 0  Moving slowly or  fidgety/restless - - - - - - 0  Suicidal thoughts - - - - - - 0  PHQ-9 Score - - - - - - 2  Some recent data might be hidden    Review of Systems  HENT: Negative.   Eyes: Negative.   Respiratory: Positive for shortness of breath.   Cardiovascular: Negative.   Gastrointestinal: Positive for constipation.  Endocrine: Negative.        High blood sugar  Genitourinary: Negative.   Musculoskeletal: Positive for back pain, gait problem and neck pain.  Skin: Negative.   Allergic/Immunologic: Negative.   Neurological: Positive for dizziness, weakness and numbness.       Tingling  Hematological: Negative.   Psychiatric/Behavioral: Negative.   All other systems reviewed and are negative.      Objective:   Physical Exam  Constitutional: He is oriented to person, place, and time. He appears well-developed and well-nourished.  HENT:  Head: Normocephalic and atraumatic.  Neck: Normal range of motion. Neck supple.  Cardiovascular: Normal rate and regular rhythm.   Pulmonary/Chest: Effort normal and breath sounds normal.  Continuous 3 liters nasal cannula Trach with PMV  Musculoskeletal:  Normal Muscle Bulk and Muscle Testing Reveals: Upper Extremities: Full ROM and Muscle Strength 5/5 Back without spinal tenderness noted Lower Extremities: Full ROM and Muscle Strength 5/5 Right Lower Extremity Flexion Produces Pain into Patella Arrived in motorized wheelchair    Neurological: He is alert and oriented to person, place, and time.  Skin: Skin is warm and dry.  Psychiatric: He has a normal mood and affect.  Nursing note and vitals reviewed.         Assessment & Plan:  1. End-stage right knee osteoarthritis: Continue to use Lidocaine gel. Refilled: MS contin 15 mg #60. One tablet every 12 hours and Percocet 7.5/325 mg # 90 pills--use every 8 hours as needed for pain #90. Second script's for the following month.08/23/2016 2. Morbid obesity with generalized deconditioning: Continue  to monitor 08/23/2016 3. Chronic low back pain/spondylosis: Continue MS contin with prn percocet. 08/23/2016 4. Narcotic induced constipation: Continue with Bowel Program: Senna and Miralax as prescribed. 08/23/2016 5. Acute on Chronic Respiratory Failure: Continuous Oxygen: S/P Tracheostomy/ PMV/ Vent at HS. Pulmonary Following. 08/23/2016 6. Peripheral Neuropathy: Continue Topamax. 08/23/2016 7. Low Back Pain: continue HEP as tolerated and current medication regimen.    20 minutes of face to face patient care time was spent during this visit. All questions were encouraged and answered.  F/U in 2 months

## 2016-08-27 LAB — 6-ACETYLMORPHINE,TOXASSURE ADD
6-ACETYLMORPHINE: NEGATIVE
6-ACETYLMORPHINE: NOT DETECTED ng/mg{creat}

## 2016-08-27 LAB — TOXASSURE SELECT,+ANTIDEPR,UR

## 2016-08-28 ENCOUNTER — Other Ambulatory Visit: Payer: Self-pay | Admitting: General Practice

## 2016-08-28 ENCOUNTER — Other Ambulatory Visit: Payer: Self-pay | Admitting: Internal Medicine

## 2016-08-28 ENCOUNTER — Other Ambulatory Visit: Payer: Self-pay | Admitting: Endocrinology

## 2016-08-28 MED ORDER — WARFARIN SODIUM 6 MG PO TABS: ORAL_TABLET | ORAL | 3 refills | Status: DC

## 2016-08-28 NOTE — Telephone Encounter (Signed)
Please refer this refill request to cardiology

## 2016-08-30 ENCOUNTER — Telehealth: Payer: Self-pay | Admitting: Pulmonary Disease

## 2016-08-30 ENCOUNTER — Ambulatory Visit (INDEPENDENT_AMBULATORY_CARE_PROVIDER_SITE_OTHER): Payer: Medicare Other | Admitting: Pulmonary Disease

## 2016-08-30 ENCOUNTER — Encounter: Payer: Self-pay | Admitting: Pulmonary Disease

## 2016-08-30 VITALS — BP 104/60 | HR 65 | Ht 66.0 in | Wt 310.4 lb

## 2016-08-30 DIAGNOSIS — R0902 Hypoxemia: Secondary | ICD-10-CM | POA: Diagnosis not present

## 2016-08-30 DIAGNOSIS — J9612 Chronic respiratory failure with hypercapnia: Secondary | ICD-10-CM | POA: Diagnosis not present

## 2016-08-30 DIAGNOSIS — J9611 Chronic respiratory failure with hypoxia: Secondary | ICD-10-CM

## 2016-08-30 DIAGNOSIS — G4733 Obstructive sleep apnea (adult) (pediatric): Secondary | ICD-10-CM | POA: Diagnosis not present

## 2016-08-30 NOTE — Progress Notes (Signed)
Subjective:    Patient ID: Blake Summers, male    DOB: [birth year], 71 y.o.   MRN: [id number]  HPI Patient has 8 PY smoking history, quit when he was in his 30s.  He is not diagnosed with asthma or copd.  Patient was diagnosed with OSA in 2000. Unsure severity.  He was on CPAP for 10 yrs and it was switched to BiPap 4-5 yrs ago.  He was placed on a NIV in April 2016 for hypercapneic respiratory failure. He was admitted for awhile, transferred to [facility], then discharged on NIV.  He was admitted in 06/2014 for AoC hypoxemic hypercapneic resp failure.  He was intubated and had a tracheostomy. Has a PMV.  His last admission was at [facility] in 02/2015. He has not been admiited since 02/2015.  He has been on O2 24/7 since July 2013. He is now on 3L o2 24/7.  No h/o CA or DVT.  He is wheelchair bound x 5 yrs 2/2 arthritis in knees. He needs help with ADLs.   He takes Percocet 3x/day and MS Contin. Been on meds at least 5 yrs. Stable dose.     Review of Systems  Constitutional: Negative.  Negative for fever and unexpected weight change.  HENT: Negative.  Negative for congestion, dental problem, ear pain, nosebleeds, postnasal drip, rhinorrhea, sinus pressure, sneezing, sore throat and trouble swallowing.   Eyes: Negative.  Negative for redness and itching.  Respiratory: Positive for shortness of breath. Negative for cough, chest tightness and wheezing.   Cardiovascular: Negative.  Negative for palpitations and leg swelling.  Gastrointestinal: Negative.  Negative for nausea and vomiting.  Endocrine: Negative.   Genitourinary: Negative.  Negative for dysuria.  Musculoskeletal: Negative.  Negative for joint swelling.  Skin: Negative.  Negative for rash.  Allergic/Immunologic: Negative.  Negative for environmental allergies, food allergies and immunocompromised state.  Neurological: Negative.  Negative for headaches.  Hematological: Negative.  Does not bruise/bleed easily.    Psychiatric/Behavioral: Negative.  Negative for dysphoric mood. The patient is not nervous/anxious.     Past Medical History:  Diagnosis Date  . ALLERGIC RHINITIS 07/08/2008  . BENIGN PROSTATIC HYPERTROPHY 07/08/2008  . CEREBROVASCULAR ACCIDENT, HX OF 07/08/2008  . DEGENERATIVE JOINT DISEASE 05/24/2009   R knee, end stage  . DIABETES MELLITUS, TYPE II 05/04/2007  . DIABETIC ULCER, LEFT LEG 08/23/2009  . Diarrhea 06/08/2013  . DM nephropathy/sclerosis   . Edema 10/28/2007  . 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 FATTY LIVER DISEASE 05/19/2008  . GERD 07/06/2010  . GLAUCOMA 07/08/2008  . HYPERLIPIDEMIA 07/08/2008  . HYPERTENSION 10/28/2007  . 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 Lumbar spondylosis   . Morbid obesity (HCC)   . NEPHROLITHIASIS, HX OF 07/08/2008  . OBSTRUCTIVE SLEEP APNEA 03/06/2008  . VENOUS INSUFFICIENCY 03/08/2009     Family History  Problem Relation Age of Onset  . Asthma Mother   . Colon cancer Brother   . Heart disease Brother   . Heart disease Brother      Past Surgical History:  Procedure Laterality Date  . LITHOTRIPSY  2007  . TRACHEOSTOMY TUBE PLACEMENT N/A 07/14/2014   Procedure: TRACHEOSTOMY;  Surgeon: Darletta MollSui W Teoh, MD;  [facility];  Service: ENT;  Laterality: N/A;    Social History   Social History  . Marital status: Married    Spouse name: N/A  . Number of children: N/A  . Years of education: N/A   Occupational History  . 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 Smoking status: Former Smoker    Packs/day: 0.50    Years: 15.00    Types: Cigarettes    Quit date: 06/12/1988  . Smokeless tobacco: Never Used  . Alcohol use No  . Drug use: No  . Sexual activity: Not on file   Other Topics Concern  . Not on file   Social History Narrative   Diet is "good"   Exercise is limited by medical problems     Allergies  Allergen Reactions  . Neosporin [Neomycin-Polymyxin-Gramicidin] Other (See Comments)    unknown      Outpatient Medications Prior to Visit  Medication Sig Dispense Refill  . brimonidine (ALPHAGAN) 0.15 % ophthalmic solution Place 1 drop into both eyes 2 (two) times daily.     . chlorpheniramine (CHLOR-TRIMETON) 4 MG tablet Take 4 mg by mouth 2 (two) times daily as needed for allergies.    . DEXILANT 60 MG capsule TAKE 1 CAPSULE BY MOUTH DAILY 30 capsule 2  . diltiazem (CARDIZEM) 60 MG tablet Take 1 tablet (60 mg total) by mouth every 8 (eight) hours. 180 tablet 0  . dorzolamide-timolol (COSOPT) 22.3-6.8 MG/ML ophthalmic solution Place 1 drop into both eyes 2 (two) times daily.     . famotidine (PEPCID) 20 MG tablet TAKE 1 TABLET BY MOUTH 2 TIMES A DAY 60 tablet 11  . furosemide (LASIX) 80 MG tablet TAKE 1 TABLET BY MOUTH 2 TIMES A DAY 60 tablet 11  . glucose blood (ONETOUCH VERIO) test strip Use to check blood sugar three times daily... Dx code I48.2 100 each 5  . insulin aspart (NOVOLOG) 100 UNIT/ML FlexPen Use to inject insulin 3 times per day 30-30-15. 30 mL 4  . Insulin Glargine (LANTUS SOLOSTAR) 100 UNIT/ML Solostar Pen Inject 60 Units into the skin at bedtime. 30 mL 2  . ketoconazole (NIZORAL) 2 % shampoo Apply 1 application topically once a week.     . lactulose (CHRONULAC) 10 GM/15ML solution TAKE 45 ML'S (3 TABLESPOONFULS) (30 GM TOTAL) TWO TIMES DAILY 960 mL 11  . latanoprost (XALATAN) 0.005 % ophthalmic solution Place 1 drop into both eyes at bedtime. 2.5 mL 4  . lidocaine (XYLOCAINE) 2 % jelly Apply 1 application topically 3 (three) times daily. As needed for pain 40 mL 11  . losartan (COZAAR) 50 MG tablet TAKE 1 TABLET BY MOUTH EVERY DAY 90 tablet 2  . morphine (MS CONTIN) 15 MG 12 hr tablet Take 1 tablet (15 mg total) by mouth every 12 (twelve) hours. 60 tablet 0  . NOVOFINE 30G X 8 MM MISC USE 4 TIMES A DAY 120 each 11  . nystatin (MYCOSTATIN/NYSTOP) powder APPLY TO AFFECTED AREA 4 TIMES DAILY 120 g 11  . oxyCODONE-acetaminophen (PERCOCET) 7.5-325 MG tablet Take 1 tablet by  mouth every 8 (eight) hours as needed (pain). 90 tablet 0  . OXYGEN Oxygen 3-4 lpm  Vent with sleep    . polyethylene glycol powder (GLYCOLAX/MIRALAX) powder MIX 17 GRAMS (TO LINE INSIDE LID) IN 8 TO 10 OUNCES OF LIQUID AND DRINK EVERY DAY AS PHYSICIAN INSTRUCTED 527 g 11  . pravastatin (PRAVACHOL) 40 MG tablet TAKE 1 TABLET BY MOUTH DAILY AT 6PM 90 tablet 0  . senna (SENOKOT) 8.6 MG TABS tablet Take 1 tablet (8.6 mg total) by mouth daily as needed for mild constipation. 50 each 5  . tamsulosin (FLOMAX) 0.4 MG CAPS capsule TAKE 1 CAPSULE BY MOUTH DAILY 30 capsule 11  .  topiramate (TOPAMAX) 50 MG tablet TAKE 1 TABLET BY MOUTH 2 TIMES A DAY 60 tablet 1  . warfarin (COUMADIN) 6 MG tablet TAKE UP TO 2 TABLETS DAILY OR AS DIRECTED BY THE COUMADIN CLINIC 60 tablet 3   No facility-administered medications prior to visit.    Meds ordered this encounter  Medications  . methylPREDNISolone (MEDROL) 4 MG tablet    Sig: Take 4 mg by mouth.         Objective:   Physical Exam Vitals:  Vitals:   08/30/16 1148  BP: 104/60  Pulse: 65  SpO2: 97%  Weight: (!) 310 lb 6.4 oz (140.8 kg)  Height: 5\' 6"  (1.676 m)    Constitutional/General:  Morbidly obese, Pleasant, w distress,  Comfortably seating on his wheelchair.  Well kempt  Body mass index is 50.1 kg/m. Wt Readings from Last 3 Encounters:  08/30/16 (!) 310 lb 6.4 oz (140.8 kg)  07/05/16 (!) 318 lb 6.4 oz (144.4 kg)  02/16/16 (!) 321 lb (145.6 kg)    HEENT: Pupils equal and reactive to light and accommodation. Anicteric sclerae. Normal nasal mucosa.   No oral  lesions,  mouth clear,  oropharynx clear, no postnasal drip. (-) Oral thrush. No dental caries.  Airway - Mallampati class IV  Neck: No masses. Midline trachea. No JVD, (-) LAD. (-) bruits appreciated. (=) clean appearing trache with PMV.   Respiratory/Chest: Grossly normal chest. (-) deformity. (-) Accessory muscle use.  Symmetric expansion. (-) Tenderness on palpation.    Resonant on percussion.  Diminished BS on both lower lung zones. (-) wheezing, rhonchi Crackles at bases.  (-) egophony  Cardiovascular: Regular rate and  rhythm, heart sounds normal, no murmur or gallops, Gr 2 edema  Gastrointestinal:  Normal bowel sounds. Soft, non-tender. No hepatosplenomegaly.  (-) masses.   Musculoskeletal:  Normal muscle tone. Unable to examine gait as he is wheelchair bound.   Extremities: Grossly normal. (-) clubbing, cyanosis.  Gr 2  edema  Skin: (-) rash,lesions seen.   Neurological/Psychiatric : alert, oriented to time, place, person. Normal mood and affect           Assessment & Plan:  Chronic respiratory failure with hypoxia and hypercapnia (HCC) Patient has 8 PY smoking history, quit when he was in his 30s.  He is not diagnosed with asthma or copd.  Patient was diagnosed with OSA in 2000. Unsure severity.  He was on CPAP for 10 yrs and it was switched to BiPap 4-5 yrs ago.   He was placed on a NIV in April 2016 for hypoxemic  hypercapneic respiratory failure. He was admitted for awhile, transferred to [facility], then discharged on NIV.   He was admitted in 06/2014 for AoC hypoxemic hypercapneic resp failure.  He was intubated and had a tracheostomy. Has a PMV.   His last admission was at [facility] in 02/2015. He has not been admiited since 02/2015.  He has been on O2 24/7 since July 2013. He is now on 3L o2 24/7.    He was prescribed the NIV in 2016 but he has been intolerant. He does not think it's a pressure. He feels he cannot catch his breath with NIV. He is uncomfortable with it. He has not been using it for the most part since he was prescribed the ventilator in 2016. Ironically, he got a new NIV machine at the start of the year. RT  has not tweaked his pressures or settings.  When he sleeps, he takes off the  trache cap and plugs in  2-3 liters oxygen via a trach mask.  He sleeps 7 hrs per night. He feels refreshed when he wakes up. He  naps  during the daytime as well as in the afternoon. He feels better when he wakes up.  He takes Percocet 3 times a day and MS Contin twice a day. It is for his chronic pain. Pain has been stable.  He had a sleep study in the lab years ago and he was not able to sleep as they asked him to lay down.   Plan : Unfortunately, given his chronic hypoxemic hypercapnic respiratory failure related to obstructive sleep apnea, obesity hypoventilation syndrome, morbid obesity, diastolic heart failure, noninvasive ventilator will be for his best interest. Patient however has not tolerated it since he was prescribed in 2016.  Ironically, he got a new noninvasive ventilator at the start of the year.  He is able to tolerate the pressure. He just feels uncomfortable with the machine. He feels he cannot catch his breath when he uses it.  My initial thought process was to try him on an ASTRAL NIV machine which people with obesity/OHS  better tolerate. He wanted to hold off on another noninvasive ventilator.  Theoretically, tracheostomy will be treatment for his sleep apnea, not necessarily obesity hypoventilation syndrome.  We decided we will get a lab sleep study with this tracheostomy uncapped, with o2 2-3 L for non apneic hypoxemia.  Most likely he still has sleep apnea/OHS  and my gut sense is he will better tolerate an auto CPAP or an auto BiPAP machine via his tracheostomy. We need to prove to his insurance that he has OSA/OHS, and determine the best pressure for him (cpap or Bipap).   We'll need to talk to the laboratory (sleep) so that his sleep study will be done without any issues. Most likely, he needs to be sleeping upright during the study.  Patient was also wondering if it will be okay for him to be switched to a cuffless trache.  I will need to make sure that he will not be on a NIV before the switch to a cuffless trache.   Definitely, no decannulatiuon for this pt and I made that clear with him  and the wife.   Hypoxemia Cont O2 3L with NIV or at HS and with exertion. Keep o2 sats > 88%.   Return to clinic in 4-6 weeks   J. Alexis Frock, MD 08/30/2016, 3:19 PM [facility] Pager [phone] After 3 pm or if no answer, call [phone]

## 2016-08-30 NOTE — Patient Instructions (Signed)
  It was a pleasure taking care of you today!  Continue using your ventilator if you tolerate it.  Continue O2 2-3 L to keep O2 sats > 88%  We will schedule you for a sleep study in the lab.   Return to clinic in 4-6 weeks   with Dr. Christene Slatese Dios (double book if needed).

## 2016-08-30 NOTE — Assessment & Plan Note (Signed)
Cont O2 3L with NIV or at HS and with exertion. Keep o2 sats > 88%.

## 2016-08-30 NOTE — Telephone Encounter (Signed)
   [name] >> Do you know whom I can talk to re: pt's ventilator?  I need the number of the RT of the DME company so I can discuss the ventilator.  Do you think you can find that for me?  Thanks!  - J. Alexis FrockAngelo A de Dios, MD 08/30/2016, 11:17 PM [place] [facility] Pager [phone] After 3 pm or if no answer, call [phone]

## 2016-08-30 NOTE — Assessment & Plan Note (Addendum)
Patient has 8 PY smoking history, quit when he was in his 30s.  He is not diagnosed with asthma or copd.  Patient was diagnosed with OSA in 2000. Unsure severity.  He was on CPAP for 10 yrs and it was switched to BiPap 4-5 yrs ago.   He was placed on a NIV in April 2016 for hypoxemic  hypercapneic respiratory failure. He was admitted for awhile, transferred to [facility], then discharged on NIV.   He was admitted in 06/2014 for AoC hypoxemic hypercapneic resp failure.  He was intubated and had a tracheostomy. Has a PMV.   His last admission was at [facility] in 02/2015. He has not been admiited since 02/2015.  He has been on O2 24/7 since July 2013. He is now on 3L o2 24/7.    He was prescribed the NIV in 2016 but he has been intolerant. He does not think it's a pressure. He feels he cannot catch his breath with NIV. He is uncomfortable with it. He has not been using it for the most part since he was prescribed the ventilator in 2016. Ironically, he got a new NIV machine at the start of the year. RT  has not tweaked his pressures or settings.  When he sleeps, he takes off the trache cap and plugs in  2-3 liters oxygen via a trach mask.  He sleeps 7 hrs per night. He feels refreshed when he wakes up. He naps  during the daytime as well as in the afternoon. He feels better when he wakes up.  He takes Percocet 3 times a day and MS Contin twice a day. It is for his chronic pain. Pain has been stable.  He had a sleep study in the lab years ago and he was not able to sleep as they asked him to lay down.   Plan : Unfortunately, given his chronic hypoxemic hypercapnic respiratory failure related to obstructive sleep apnea, obesity hypoventilation syndrome, morbid obesity, diastolic heart failure, noninvasive ventilator will be for his best interest. Patient however has not tolerated it since he was prescribed in 2016.  Ironically, he got a new noninvasive ventilator at the start of the year.  He is able  to tolerate the pressure. He just feels uncomfortable with the machine. He feels he cannot catch his breath when he uses it.  My initial thought process was to try him on an ASTRAL NIV machine which people with obesity/OHS  better tolerate. He wanted to hold off on another noninvasive ventilator.  Theoretically, tracheostomy will be treatment for his sleep apnea, not necessarily obesity hypoventilation syndrome.  We decided we will get a lab sleep study with this tracheostomy uncapped, with o2 2-3 L for non apneic hypoxemia.  Most likely he still has sleep apnea/OHS  and my gut sense is he will better tolerate an auto CPAP or an auto BiPAP machine via his tracheostomy. We need to prove to his insurance that he has OSA/OHS, and determine the best pressure for him (cpap or Bipap).   We'll need to talk to the laboratory (sleep) so that his sleep study will be done without any issues. Most likely, he needs to be sleeping upright during the study.  Patient was also wondering if it will be okay for him to be switched to a cuffless trache.  I will need to make sure that he will not be on a NIV before the switch to a cuffless trache.   Definitely, no decannulatiuon for this pt  and I made that clear with him and the wife.

## 2016-08-31 ENCOUNTER — Other Ambulatory Visit: Payer: Self-pay | Admitting: General Practice

## 2016-08-31 MED ORDER — WARFARIN SODIUM 6 MG PO TABS: ORAL_TABLET | ORAL | 3 refills | Status: AC

## 2016-08-31 NOTE — Telephone Encounter (Signed)
[facility] [phone] will get you to the RT

## 2016-09-01 ENCOUNTER — Ambulatory Visit (INDEPENDENT_AMBULATORY_CARE_PROVIDER_SITE_OTHER): Payer: Medicare Other | Admitting: General Practice

## 2016-09-01 DIAGNOSIS — I4891 Unspecified atrial fibrillation: Secondary | ICD-10-CM

## 2016-09-01 DIAGNOSIS — Z5181 Encounter for therapeutic drug level monitoring: Secondary | ICD-10-CM | POA: Diagnosis not present

## 2016-09-01 LAB — POCT INR: INR: 2.2

## 2016-09-01 NOTE — Patient Instructions (Signed)
Pre visit review using our clinic review tool, if applicable. No additional management support is needed unless otherwise documented below in the visit note. 

## 2016-09-02 ENCOUNTER — Telehealth: Payer: Self-pay | Admitting: Pulmonary Disease

## 2016-09-02 NOTE — Telephone Encounter (Signed)
   [name]   [facility].   Can we hold off on the sleep study for this patient? We will try to tweak his NIV so that he will tolerate it.   Thanks.  Pollie MeyerJ. Angelo A de Dios, MD 09/02/2016, 1:51 AM [place] Pager [phone] After 3 pm or if no answer, call [phone]

## 2016-09-02 NOTE — Progress Notes (Signed)
I have reviewed and agree with the plan. 

## 2016-09-04 NOTE — Addendum Note (Signed)
Addended by: Garfield CorneaMABRY, Francyne Arreaga L on: 09/04/2016 03:17 PM   Modules accepted: Orders

## 2016-09-04 NOTE — Telephone Encounter (Signed)
PCCs canceled sleep study. Attempted to contact pt but unable to leave a vm

## 2016-09-07 NOTE — Progress Notes (Signed)
Cardiology Office Note   Date:  09/08/2016   ID:  Blake Summers, DOB 1946-04-12, MRN [id number]  PCP:  Romero BellingELLISON, SEAN, MD  Cardiologist:   Dietrich PatesPaula Venola Castello, MD    F/U of CAD     History of Present Illness: Blake Summers is a 71 y.o. male with a history of   CAD, CVA, polyneuropathy, DM, hypoventilation and atrial fib  (rate control and coumadin)  I saw him in clinic in September 2016.  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 brimonidine (ALPHAGAN) 0.15 % ophthalmic solution Place 1 drop into both eyes 2 (two) times daily.   . chlorpheniramine (CHLOR-TRIMETON) 4 MG tablet Take 4 mg by mouth 2 (two) times daily as needed for allergies.  . DEXILANT 60 MG capsule TAKE 1 CAPSULE BY MOUTH DAILY  . diltiazem (CARDIZEM) 60 MG tablet Take 1 tablet (60 mg total) by mouth every 8 (eight) hours.  . dorzolamide-timolol (COSOPT) 22.3-6.8 MG/ML ophthalmic solution Place 1 drop into both eyes 2 (two) times daily.   . famotidine (PEPCID) 20 MG tablet TAKE 1 TABLET BY MOUTH 2 TIMES A DAY  . furosemide (LASIX) 80 MG tablet TAKE 1 TABLET BY MOUTH 2 TIMES A DAY  . glucose blood (ONETOUCH VERIO) test strip Use to check blood sugar three times daily... Dx code I48.2  . insulin aspart (NOVOLOG) 100 UNIT/ML FlexPen Use to inject insulin 3 times per day 30-30-15.  . Insulin Glargine (LANTUS SOLOSTAR) 100 UNIT/ML Solostar Pen Inject 60 Units into the skin at bedtime.  Marland Kitchen. ketoconazole (NIZORAL) 2 % shampoo Apply 1 application topically once a week.   . lactulose (CHRONULAC) 10 GM/15ML solution TAKE 45 ML'S (3 TABLESPOONFULS) (30 GM TOTAL) TWO TIMES DAILY  . latanoprost (XALATAN) 0.005 % ophthalmic solution Place 1 drop into both eyes at bedtime.  . lidocaine (XYLOCAINE) 2 % jelly Apply 1 application topically 3 (three)  times daily. As needed for pain  . losartan (COZAAR) 50 MG tablet TAKE 1 TABLET BY MOUTH EVERY DAY  . methylPREDNISolone (MEDROL) 4 MG tablet Take 4 mg by mouth daily.   Marland Kitchen. morphine (MS CONTIN) 15 MG 12 hr tablet Take 1 tablet (15 mg total) by mouth every 12 (twelve) hours.  Marland Kitchen. NOVOFINE 30G X 8 MM MISC USE 4 TIMES A DAY  . nystatin (MYCOSTATIN/NYSTOP) powder APPLY TO AFFECTED AREA 4 TIMES DAILY  . oxyCODONE-acetaminophen (PERCOCET) 7.5-325 MG tablet Take 1 tablet by mouth every 8 (eight) hours as needed (pain).  . OXYGEN Oxygen 3-4 lpm  Vent with sleep  . polyethylene glycol powder (GLYCOLAX/MIRALAX) powder MIX 17 GRAMS (TO LINE INSIDE LID) IN 8 TO 10 OUNCES OF LIQUID AND DRINK EVERY DAY AS PHYSICIAN INSTRUCTED  . pravastatin (PRAVACHOL) 40 MG tablet TAKE 1 TABLET BY MOUTH DAILY AT 6PM  . senna (SENOKOT) 8.6 MG TABS tablet Take 1 tablet (8.6 mg total) by mouth daily as needed for mild constipation.  . tamsulosin (FLOMAX) 0.4 MG CAPS capsule TAKE 1 CAPSULE BY MOUTH DAILY  . topiramate (TOPAMAX) 50 MG tablet TAKE 1 TABLET BY MOUTH 2 TIMES A DAY  . warfarin (COUMADIN) 6 MG tablet TAKE UP TO 2 TABLETS DAILY OR AS DIRECTED BY THE COUMADIN CLINIC  Allergies:   Neosporin [neomycin-polymyxin-gramicidin]   Past Medical History:  Diagnosis Date  . ALLERGIC RHINITIS 07/08/2008  . BENIGN PROSTATIC HYPERTROPHY 07/08/2008  . CEREBROVASCULAR ACCIDENT, HX OF 07/08/2008  . DEGENERATIVE JOINT DISEASE 05/24/2009   R knee, end stage  . DIABETES MELLITUS, TYPE II 05/04/2007  . DIABETIC ULCER, LEFT LEG 08/23/2009  . Diarrhea 06/08/2013  . DM nephropathy/sclerosis   . Edema 10/28/2007  . 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 FATTY LIVER DISEASE 05/19/2008  . GERD 07/06/2010  . GLAUCOMA 07/08/2008  . HYPERLIPIDEMIA 07/08/2008  . HYPERTENSION 10/28/2007  . 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 Lumbar spondylosis   . Morbid obesity (HCC)   . NEPHROLITHIASIS, HX OF 07/08/2008  . OBSTRUCTIVE SLEEP APNEA 03/06/2008    . VENOUS INSUFFICIENCY 03/08/2009    Past Surgical History:  Procedure Laterality Date  . LITHOTRIPSY  2007  . TRACHEOSTOMY TUBE PLACEMENT N/A 07/14/2014   Procedure: TRACHEOSTOMY;  Surgeon: Darletta Moll, MD;  [facility];  Service: ENT;  Laterality: N/A;     Social History:  The patient  reports that he quit smoking about 28 years ago. His smoking use included Cigarettes. He has a 7.50 pack-year smoking history. He has never used smokeless tobacco. He reports that he does not drink alcohol or use drugs.   Family History:  The patient's family history includes Asthma in his mother; Colon cancer in his brother; Heart disease in his brother and brother.    ROS:  Please see the history of present illness. All other systems are reviewed and  Negative to the above problem except as noted.    PHYSICAL EXAM: VS:  BP 114/62   Pulse 63   Ht 5\' 6"  (1.676 m)   Wt (!) 316 lb 3.2 oz (143.4 kg)   BMI 51.04 kg/m   GEN: Morbidly obese 71 yo examined in wheelchair  Trach in place  HEENT: NCAT Neck: Trach   Cardiac: RRR; no murmurs, rubs, or gallops,1 -2+edema  Respiratory:  clear to auscultation bilaterally, normal work of breathing GI: Obese  Nontender     Skin: warm and dry, no rash  LEgs wrapped   Psych: euthymic mood, full affect   EKG:  EKG is not ordered today.   Lipid Panel    Component Value Date/Time   CHOL 112 05/17/2016 1426   TRIG 248.0 (H) 05/17/2016 1426   HDL 27.80 (L) 05/17/2016 1426   CHOLHDL 4 05/17/2016 1426   VLDL 49.6 (H) 05/17/2016 1426   LDLCALC 65 02/25/2014 1211   LDLDIRECT 47.0 05/17/2016 1426      Wt Readings from Last 3 Encounters:  09/08/16 (!) 316 lb 3.2 oz (143.4 kg)  08/30/16 (!) 310 lb 6.4 oz (140.8 kg)  07/05/16 (!) 318 lb 6.4 oz (144.4 kg)      ASSESSMENT AND PLAN:  1  CAD  I am not convinced of active ischemia  Keep on same regimen Comfortable with trach/vent 2  Atrial fib   Rhythm is fairly regular  Keep on current regimen including  coumadin  3  HL  Continue pravastatin  LDL is OK  F/U in 10 months      Current medicines are reviewed at length with the patient today.  The patient does not have concerns regarding medicines.  Signed, Dietrich Pates, MD  09/08/2016 2:24 PM    [facility] [address] [place], [place], [place]  [address] Phone: [phone]; Fax: [phone]

## 2016-09-08 ENCOUNTER — Encounter: Payer: Self-pay | Admitting: Internal Medicine

## 2016-09-08 ENCOUNTER — Ambulatory Visit (INDEPENDENT_AMBULATORY_CARE_PROVIDER_SITE_OTHER): Payer: Medicare Other | Admitting: Internal Medicine

## 2016-09-08 VITALS — BP 114/62 | HR 63 | Ht 66.0 in | Wt 316.2 lb

## 2016-09-08 DIAGNOSIS — E785 Hyperlipidemia, unspecified: Secondary | ICD-10-CM | POA: Diagnosis not present

## 2016-09-08 DIAGNOSIS — I480 Paroxysmal atrial fibrillation: Secondary | ICD-10-CM | POA: Diagnosis not present

## 2016-09-08 DIAGNOSIS — I2510 Atherosclerotic heart disease of native coronary artery without angina pectoris: Secondary | ICD-10-CM

## 2016-09-08 NOTE — Patient Instructions (Signed)
Your physician wants you to follow-up in: 10 MONTHS WITH DR Filbert Schilder will receive a reminder letter in the mail two months in advance. If you don't receive a letter, please call our office to schedule the follow-up appointment.   If you need a refill on your cardiac medications before your next appointment, please call your pharmacy.

## 2016-09-11 ENCOUNTER — Other Ambulatory Visit: Payer: Self-pay | Admitting: Endocrinology

## 2016-09-12 ENCOUNTER — Telehealth: Payer: Self-pay | Admitting: Pulmonary Disease

## 2016-09-12 NOTE — Telephone Encounter (Signed)
   I spoke with [facility] so they can tweak his noninvasive ventilator so patient could tolerate it. He could not tolerate AVAPS mode. He is currently on BiPAP mode. Pressure support of 14 cm water, backup rate of 12, PEEP of 8. Oxygen to be titrated to keep O2 sats more than 90%. He will need an ONO on 2L and NIV.    Pollie Meyer, MD 09/12/2016, 5:22 PM [facility] Pager [phone] After 3 pm or if no answer, call [phone]

## 2016-09-14 NOTE — Telephone Encounter (Signed)
Attempted to reach pt unable to leave a message.

## 2016-09-18 ENCOUNTER — Other Ambulatory Visit: Payer: Self-pay | Admitting: Endocrinology

## 2016-09-20 ENCOUNTER — Ambulatory Visit: Payer: Medicare Other | Admitting: Endocrinology

## 2016-09-21 ENCOUNTER — Telehealth: Payer: Self-pay | Admitting: Registered Nurse

## 2016-09-21 ENCOUNTER — Telehealth: Payer: Self-pay | Admitting: Pulmonary Disease

## 2016-09-21 NOTE — Telephone Encounter (Signed)
Blake Summers UDS was performed on 08/23/2016, it was consistent.

## 2016-09-21 NOTE — Telephone Encounter (Signed)
   ONO on 2L and NIV/Bipap mode : no significant desatn.   [name] : pls tell pt that 2 L with the vent for for him.  Thanks.   Pollie Meyer, MD 09/21/2016, 4:27 PM [facility] Pager [phone] After 3 pm or if no answer, call [phone]

## 2016-09-22 ENCOUNTER — Ambulatory Visit (INDEPENDENT_AMBULATORY_CARE_PROVIDER_SITE_OTHER): Payer: Medicare Other | Admitting: Endocrinology

## 2016-09-22 ENCOUNTER — Encounter: Payer: Self-pay | Admitting: Endocrinology

## 2016-09-22 ENCOUNTER — Ambulatory Visit: Payer: Medicare Other | Admitting: Endocrinology

## 2016-09-22 VITALS — BP 124/62 | HR 70 | Ht 66.0 in | Wt 316.0 lb

## 2016-09-22 DIAGNOSIS — D509 Iron deficiency anemia, unspecified: Secondary | ICD-10-CM

## 2016-09-22 DIAGNOSIS — Z794 Long term (current) use of insulin: Secondary | ICD-10-CM

## 2016-09-22 DIAGNOSIS — E1159 Type 2 diabetes mellitus with other circulatory complications: Secondary | ICD-10-CM

## 2016-09-22 LAB — POCT GLYCOSYLATED HEMOGLOBIN (HGB A1C): Hemoglobin A1C: 6.4

## 2016-09-22 MED ORDER — INSULIN ASPART 100 UNIT/ML FLEXPEN: PEN_INJECTOR | SUBCUTANEOUS | 4 refills | Status: AC

## 2016-09-22 MED ORDER — INSULIN GLARGINE 100 UNIT/ML SOLOSTAR PEN: 50.0000 [IU] | PEN_INJECTOR | Freq: Every day | SUBCUTANEOUS | 2 refills | Status: AC

## 2016-09-22 NOTE — Progress Notes (Signed)
Subjective:    Patient ID: Blake Summers, male    DOB: 13-Mar-1946, 71 y.o.   MRN: [id number]  HPI Pt returns for f/u of diabetes mellitus: DM type: Insulin-requiring type 2 Dx'ed: 1990 Complications: polyneuropathy, CAD, CVA, and foot ulcer.   Therapy: insulin since 1999 DKA: never Severe hypoglycemia: never Pancreatitis: never Other: he takes multiple daily injections; he was turned down for weight-loss surgery, due to comorbidities.   Interval history: no cbg record, but states cbg's vary from 66-200's.  There is no trend throughout the day.  This cbg of 66 in the only episode of hypoglycemia since last ov here.  It is in general higher as the day goes on.  Anemia: denies BRBPR.  He does not take fe tabs.   Past Medical History:  Diagnosis Date  . ALLERGIC RHINITIS 07/08/2008  . BENIGN PROSTATIC HYPERTROPHY 07/08/2008  . CEREBROVASCULAR ACCIDENT, HX OF 07/08/2008  . DEGENERATIVE JOINT DISEASE 05/24/2009   R knee, end stage  . DIABETES MELLITUS, TYPE II 05/04/2007  . DIABETIC ULCER, LEFT LEG 08/23/2009  . Diarrhea 06/08/2013  . DM nephropathy/sclerosis   . Edema 10/28/2007  . 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 FATTY LIVER DISEASE 05/19/2008  . GERD 07/06/2010  . GLAUCOMA 07/08/2008  . HYPERLIPIDEMIA 07/08/2008  . HYPERTENSION 10/28/2007  . 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 Lumbar spondylosis   . Morbid obesity (HCC)   . NEPHROLITHIASIS, HX OF 07/08/2008  . OBSTRUCTIVE SLEEP APNEA 03/06/2008  . VENOUS INSUFFICIENCY 03/08/2009    Past Surgical History:  Procedure Laterality Date  . LITHOTRIPSY  2007  . TRACHEOSTOMY TUBE PLACEMENT N/A 07/14/2014   Procedure: TRACHEOSTOMY;  Surgeon: Darletta Moll, MD;  [facility];  Service: ENT;  Laterality: N/A;    Social History   Social History  . Marital status: Married    Spouse name: N/A  . Number of children: N/A  . Years of education: N/A   Occupational History  . Retired Retired    Nature conservation officer   Social History Main  Topics  . Smoking status: Former Smoker    Packs/day: 0.50    Years: 15.00    Types: Cigarettes    Quit date: 06/12/1988  . Smokeless tobacco: Never Used  . Alcohol use No  . Drug use: No  . Sexual activity: Not on file   Other Topics Concern  . Not on file   Social History Narrative   Diet is "good"   Exercise is limited by medical problems    Current Outpatient Prescriptions on File Prior to Visit  Medication Sig Dispense Refill  . brimonidine (ALPHAGAN) 0.15 % ophthalmic solution Place 1 drop into both eyes 2 (two) times daily.     . chlorpheniramine (CHLOR-TRIMETON) 4 MG tablet Take 4 mg by mouth 2 (two) times daily as needed for allergies.    . DEXILANT 60 MG capsule TAKE 1 CAPSULE BY MOUTH DAILY 30 capsule 2  . diltiazem (CARDIZEM) 60 MG tablet Take 1 tablet (60 mg total) by mouth every 8 (eight) hours. 180 tablet 0  . dorzolamide-timolol (COSOPT) 22.3-6.8 MG/ML ophthalmic solution Place 1 drop into both eyes 2 (two) times daily.     . famotidine (PEPCID) 20 MG tablet TAKE 1 TABLET BY MOUTH 2 TIMES A DAY 60 tablet 11  . furosemide (LASIX) 80 MG tablet TAKE 1 TABLET BY MOUTH 2 TIMES A DAY 60 tablet 11  . glucose blood (ONETOUCH VERIO) test strip Use to check blood sugar three times daily.Marland KitchenMarland Kitchen  Dx code I48.2 100 each 5  . ketoconazole (NIZORAL) 2 % shampoo Apply 1 application topically once a week.     . lactulose (CHRONULAC) 10 GM/15ML solution TAKE 45 ML'S (3 TABLESPOONFULS) (30 GM TOTAL) TWO TIMES DAILY 960 mL 11  . latanoprost (XALATAN) 0.005 % ophthalmic solution Place 1 drop into both eyes at bedtime. 2.5 mL 4  . lidocaine (XYLOCAINE) 2 % jelly Apply 1 application topically 3 (three) times daily. As needed for pain 40 mL 11  . losartan (COZAAR) 50 MG tablet TAKE 1 TABLET BY MOUTH EVERY DAY 90 tablet 2  . morphine (MS CONTIN) 15 MG 12 hr tablet Take 1 tablet (15 mg total) by mouth every 12 (twelve) hours. 60 tablet 0  . NOVOFINE 30G X 8 MM MISC USE 4 TIMES A DAY 120 each 11  .  nystatin (MYCOSTATIN/NYSTOP) powder APPLY TO AFFECTED AREA 4 TIMES DAILY 120 g 11  . oxyCODONE-acetaminophen (PERCOCET) 7.5-325 MG tablet Take 1 tablet by mouth every 8 (eight) hours as needed (pain). 90 tablet 0  . OXYGEN Oxygen 3-4 lpm  Vent with sleep    . polyethylene glycol powder (GLYCOLAX/MIRALAX) powder MIX 17 GRAMS (TO LINE INSIDE LID) IN 8 TO 10 OUNCES OF LIQUID AND DRINK EVERY DAY AS PHYSICIAN INSTRUCTED 527 g 11  . pravastatin (PRAVACHOL) 40 MG tablet TAKE 1 TABLET BY MOUTH DAILY AT 6PM 90 tablet 0  . senna (SENOKOT) 8.6 MG TABS tablet Take 1 tablet (8.6 mg total) by mouth daily as needed for mild constipation. 50 each 5  . tamsulosin (FLOMAX) 0.4 MG CAPS capsule TAKE 1 CAPSULE BY MOUTH DAILY 30 capsule 11  . topiramate (TOPAMAX) 50 MG tablet TAKE 1 TABLET BY MOUTH 2 TIMES A DAY 60 tablet 1  . warfarin (COUMADIN) 6 MG tablet TAKE UP TO 2 TABLETS DAILY OR AS DIRECTED BY THE COUMADIN CLINIC 60 tablet 3   No current facility-administered medications on file prior to visit.     Allergies  Allergen Reactions  . Neosporin [Neomycin-Polymyxin-Gramicidin] Other (See Comments)    unknown    Family History  Problem Relation Age of Onset  . Asthma Mother   . Colon cancer Brother   . Heart disease Brother   . Heart disease Brother     BP 124/62   Pulse 70   Ht  (1.676 m)   Wt (!) 316 lb (143.3 kg)   SpO2 96% Comment: On 2 liters of o2  BMI 51.00 kg/m   Review of Systems Denies hematuria.  He has constipation, despite stool softener.      Objective:   Physical Exam VITAL SIGNS:  See vs page GENERAL: no distress.  In wheelchair.  Has 02 on Ext: feet are wrapped (sees wound care in [place]).   a1c=6.4%     Assessment & Plan:  Insulin-requiring type 2 DM, with CVA: overcontrolled, given this regimen, which does match insulin to his changing needs throughout the day Anemia: he needs rx and w/u resp failure: he is not a candidate for colonoscopy unless there is very high  risk.  Constipation: he needs increased rx  Patient Instructions  Please reduce the lantus to 50 units at bedtime, and: Please continue the same novolog.   check your blood sugar twice a day.  vary the time of day when you check, between before the 3 meals, and at bedtime.  also check if you have symptoms of your blood sugar being too high or too low.  please keep a record of the readings and bring it to your next appointment here (or you can bring the meter itself).  You can write it on any piece of paper.  please call us sooner if your blood sugar goes below 70, or if you have a lot of readings over 200. Take iron, 1 pill per day.   Here are some tests for blood in the bowels.  please follow the instructions, and return to the lab.   Increase stool softener, to 3 pills twice a day, if necessary Please come back for a follow-up appointment in 3 months.

## 2016-09-22 NOTE — Patient Instructions (Addendum)
Please reduce the lantus to 50 units at bedtime, and: Please continue the same novolog.   check your blood sugar twice a day.  vary the time of day when you check, between before the 3 meals, and at bedtime.  also check if you have symptoms of your blood sugar being too high or too low.  please keep a record of the readings and bring it to your next appointment here (or you can bring the meter itself).  You can write it on any piece of paper.  please call us sooner if your blood sugar goes below 70, or if you have a lot of readings over 200. Take iron, 1 pill per day.   Here are some tests for blood in the bowels.  please follow the instructions, and return to the lab.   Increase stool softener, to 3 pills twice a day, if necessary Please come back for a follow-up appointment in 3 months.

## 2016-09-22 NOTE — Telephone Encounter (Signed)
atc pt, phone just rung, unable to leave a vm

## 2016-09-25 ENCOUNTER — Other Ambulatory Visit: Payer: Self-pay | Admitting: Physical Medicine & Rehabilitation

## 2016-09-25 NOTE — Telephone Encounter (Signed)
[id number] x1 

## 2016-09-28 NOTE — Telephone Encounter (Signed)
Spoke with pt and informed him of his results per AD. Pt is already using 2L if stated. He had no further questions. Nothing further is needed

## 2016-09-29 ENCOUNTER — Ambulatory Visit (INDEPENDENT_AMBULATORY_CARE_PROVIDER_SITE_OTHER): Payer: Medicare Other | Admitting: General Practice

## 2016-09-29 DIAGNOSIS — Z5181 Encounter for therapeutic drug level monitoring: Secondary | ICD-10-CM

## 2016-09-29 LAB — POCT INR: INR: 2.7

## 2016-09-29 NOTE — Progress Notes (Signed)
I have reviewed and agree with the plan. 

## 2016-09-29 NOTE — Patient Instructions (Signed)
Pre visit review using our clinic review tool, if applicable. No additional management support is needed unless otherwise documented below in the visit note. 

## 2016-10-04 ENCOUNTER — Ambulatory Visit: Payer: Medicare Other | Admitting: Internal Medicine

## 2016-10-11 ENCOUNTER — Other Ambulatory Visit: Payer: Self-pay | Admitting: Internal Medicine

## 2016-10-11 ENCOUNTER — Encounter (HOSPITAL_BASED_OUTPATIENT_CLINIC_OR_DEPARTMENT_OTHER): Payer: Medicare Other

## 2016-10-19 LAB — COLOGUARD

## 2016-10-23 ENCOUNTER — Other Ambulatory Visit: Payer: Self-pay | Admitting: *Deleted

## 2016-10-23 ENCOUNTER — Other Ambulatory Visit: Payer: Self-pay | Admitting: Physical Medicine & Rehabilitation

## 2016-10-23 NOTE — Telephone Encounter (Signed)
error 

## 2016-10-25 ENCOUNTER — Encounter: Payer: Self-pay | Admitting: Physical Medicine & Rehabilitation

## 2016-10-25 ENCOUNTER — Encounter: Payer: Medicare Other | Attending: Registered Nurse | Admitting: Physical Medicine & Rehabilitation

## 2016-10-25 ENCOUNTER — Telehealth: Payer: Self-pay | Admitting: *Deleted

## 2016-10-25 VITALS — BP 100/51 | HR 65

## 2016-10-25 DIAGNOSIS — G8929 Other chronic pain: Secondary | ICD-10-CM | POA: Diagnosis present

## 2016-10-25 DIAGNOSIS — T40605A Adverse effect of unspecified narcotics, initial encounter: Secondary | ICD-10-CM | POA: Insufficient documentation

## 2016-10-25 DIAGNOSIS — M1711 Unilateral primary osteoarthritis, right knee: Secondary | ICD-10-CM | POA: Diagnosis not present

## 2016-10-25 DIAGNOSIS — G609 Hereditary and idiopathic neuropathy, unspecified: Secondary | ICD-10-CM | POA: Diagnosis not present

## 2016-10-25 DIAGNOSIS — T402X5A Adverse effect of other opioids, initial encounter: Secondary | ICD-10-CM | POA: Diagnosis not present

## 2016-10-25 DIAGNOSIS — Z79899 Other long term (current) drug therapy: Secondary | ICD-10-CM

## 2016-10-25 DIAGNOSIS — M545 Low back pain: Secondary | ICD-10-CM | POA: Insufficient documentation

## 2016-10-25 DIAGNOSIS — K5903 Drug induced constipation: Secondary | ICD-10-CM | POA: Insufficient documentation

## 2016-10-25 DIAGNOSIS — M25561 Pain in right knee: Secondary | ICD-10-CM | POA: Diagnosis not present

## 2016-10-25 DIAGNOSIS — E662 Morbid (severe) obesity with alveolar hypoventilation: Secondary | ICD-10-CM

## 2016-10-25 DIAGNOSIS — Z5181 Encounter for therapeutic drug level monitoring: Secondary | ICD-10-CM

## 2016-10-25 DIAGNOSIS — G894 Chronic pain syndrome: Secondary | ICD-10-CM | POA: Diagnosis not present

## 2016-10-25 DIAGNOSIS — Z930 Tracheostomy status: Secondary | ICD-10-CM | POA: Insufficient documentation

## 2016-10-25 DIAGNOSIS — R2689 Other abnormalities of gait and mobility: Secondary | ICD-10-CM | POA: Diagnosis not present

## 2016-10-25 DIAGNOSIS — J9620 Acute and chronic respiratory failure, unspecified whether with hypoxia or hypercapnia: Secondary | ICD-10-CM | POA: Diagnosis not present

## 2016-10-25 DIAGNOSIS — Z760 Encounter for issue of repeat prescription: Secondary | ICD-10-CM | POA: Insufficient documentation

## 2016-10-25 DIAGNOSIS — M179 Osteoarthritis of knee, unspecified: Secondary | ICD-10-CM | POA: Insufficient documentation

## 2016-10-25 DIAGNOSIS — K5909 Other constipation: Secondary | ICD-10-CM | POA: Insufficient documentation

## 2016-10-25 DIAGNOSIS — M1712 Unilateral primary osteoarthritis, left knee: Secondary | ICD-10-CM | POA: Diagnosis not present

## 2016-10-25 MED ORDER — OXYCODONE-ACETAMINOPHEN 7.5-325 MG PO TABS: 1.0000 | ORAL_TABLET | Freq: Three times a day (TID) | ORAL | 0 refills | Status: DC | PRN

## 2016-10-25 MED ORDER — NALOXEGOL OXALATE 25 MG PO TABS: 25.0000 mg | ORAL_TABLET | Freq: Every day | ORAL | 5 refills | Status: DC

## 2016-10-25 MED ORDER — NALOXEGOL OXALATE 12.5 MG PO TABS: 25.0000 mg | ORAL_TABLET | Freq: Every day | ORAL | Status: DC

## 2016-10-25 MED ORDER — MORPHINE SULFATE ER 15 MG PO TBCR: 15.0000 mg | EXTENDED_RELEASE_TABLET | Freq: Two times a day (BID) | ORAL | 0 refills | Status: DC

## 2016-10-25 NOTE — Progress Notes (Signed)
Subjective:    Patient ID: Blake Summers, male    DOB: [birth year], 71 y.o.   MRN: [id number]  HPI   Blake Summers is here in follow up of his chronic pain. He continues to deal with chronic leg wounds and goes to the wound care clinic.   His pain levels have been stable to improved. He remains on ms contin and oxycodone/apap which are helping maintain his pain. He has been compliant.   His bowels have been slow. He still has constipation.    Pain Inventory Average Pain 6 Pain Right Now 5 My pain is sharp, stabbing and aching  In the last 24 hours, has pain interfered with the following? General activity 6 Relation with others 5 Enjoyment of life 8 What TIME of day is your pain at its worst? daytime Sleep (in general) Fair  Pain is worse with: walking and standing Pain improves with: rest, heat/ice, medication and injections Relief from Meds: 7  Mobility use a walker ability to climb steps?  no do you drive?  yes  Function I need assistance with the following:  dressing, bathing, meal prep, household duties and shopping  Neuro/Psych bladder control problems weakness tingling trouble walking dizziness  Prior Studies Any changes since last visit?  no  Physicians involved in your care Any changes since last visit?  no   Family History  Problem Relation Age of Onset  . Asthma Mother   . Colon cancer Brother   . Heart disease Brother   . Heart disease Brother    Social History   Social History  . Marital status: Married    Spouse name: N/A  . Number of children: N/A  . Years of education: N/A   Occupational History  . Retired Retired    Nature conservation officeroperations manager   Social History Main Topics  . Smoking status: Former Smoker    Packs/day: 0.50    Years: 15.00    Types: Cigarettes    Quit date: 06/12/1988  . Smokeless tobacco: Never Used  . Alcohol use No  . Drug use: No  . Sexual activity: Not on file   Other Topics Concern  . Not on file   Social  History Narrative   Diet is "good"   Exercise is limited by medical problems   Past Surgical History:  Procedure Laterality Date  . LITHOTRIPSY  2007  . TRACHEOSTOMY TUBE PLACEMENT N/A 07/14/2014   Procedure: TRACHEOSTOMY;  Surgeon: Darletta MollSui W Teoh, MD;  [facility];  Service: ENT;  Laterality: N/A;   Past Medical History:  Diagnosis Date  . ALLERGIC RHINITIS 07/08/2008  . BENIGN PROSTATIC HYPERTROPHY 07/08/2008  . CEREBROVASCULAR ACCIDENT, HX OF 07/08/2008  . DEGENERATIVE JOINT DISEASE 05/24/2009   R knee, end stage  . DIABETES MELLITUS, TYPE II 05/04/2007  . DIABETIC ULCER, LEFT LEG 08/23/2009  . Diarrhea 06/08/2013  . DM nephropathy/sclerosis   . Edema 10/28/2007  . 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 FATTY LIVER DISEASE 05/19/2008  . GERD 07/06/2010  . GLAUCOMA 07/08/2008  . HYPERLIPIDEMIA 07/08/2008  . HYPERTENSION 10/28/2007  . 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 Lumbar spondylosis   . Morbid obesity (HCC)   . NEPHROLITHIASIS, HX OF 07/08/2008  . OBSTRUCTIVE SLEEP APNEA 03/06/2008  . VENOUS INSUFFICIENCY 03/08/2009   There were no vitals taken for this visit.  Opioid Risk Score:   Fall Risk Score:  `1  [facility]  [facility] 02/23/2016 06/28/2015 02/16/2015 02/04/2015 12/29/2014 12/08/2014 10/20/2014  Decreased Interest 0 0 0 0 0 0 0  Down, Depressed, Hopeless 0 0 0 0 0 0 0  PHQ - 2 Score 0 0 0 0 0 0 0  Altered sleeping - - - - - - 0  Tired, decreased energy - - - - - - 2  Change in appetite - - - - - - 0  Feeling bad or failure about yourself  - - - - - - 0  Trouble concentrating - - - - - - 0  Moving slowly or fidgety/restless - - - - - - 0  Suicidal thoughts - - - - - - 0  PHQ-9 Score - - - - - - 2  Some recent data might be hidden    Review of Systems  Constitutional: Negative.   HENT: Negative.   Eyes: Negative.   Respiratory: Negative.   Cardiovascular: Negative.   Gastrointestinal: Negative.   Endocrine: Negative.   Genitourinary:  Negative.   Musculoskeletal: Negative.   Skin: Negative.   Allergic/Immunologic: Negative.   Neurological: Negative.   Hematological: Negative.   Psychiatric/Behavioral: Negative.   All other systems reviewed and are negative.      Objective:   Physical Exam  Constitutional: AxO x 3. Morbidly obese HENT:  Head: Normocephalic and atraumatic.  Eyes: Conjunctivae are normal. Pupils are equal, round, and reactive to light.  Neck: #6 XL shiley trach with PMV--unchanged.  Cardiovascular: RRR  Pulmonary/Chest: wearing oxygen, no distress  Abdominal: Soft. Bowel sounds are normal. He exhibits no distension. There is no tenderness.  Musculoskeletal:   MMT: 5/5 muscle strength of the upper extremity, and 4- to 4/5 of the lower extremity.  Lower extremity with unna wraps..  Neurological: He is alert and oriented to person, place, and time. Tinel's + left wrist. He is not earing splints.  Skin: Skin is warm and dry.  Psychiatric:  Pleasant.  His behavior is normal. Thought content normal.    Assessment & Plan:  1. End-stage right knee osteoarthritis: Continue to use lidocaine patches.  Refilled: MS contin 15 mg #60 bid.  Percocet 7.5/325 mg # 90 pills--use every 6 hours as needed for pain.  Second rx'es were provided for next month. We will continue the opioid monitoring program, this consists of regular clinic visits, examinations, urine drug screen, pill counts as well as use of [place] Controlled Substance Reporting System. [place] today.  UDS today  2. Morbid obesity with generalized deconditioning  -continue dietary changes 3. Chronic low back pain/spondylosis:  -posture and mechanics was discussed 4. Trach dependent respiratory failure: continue bipap/vent 5. Neuropathy and CTS---continued DAILY splinting, diabetes controlled---probably not a great surgical candidate -continue hs topamax, 50mg  qhs  6. Narcotic induced constipation: miralax/senna and diet  modifications  -chronulac prn  -trial of movantik as he's failed OTC/dietary therapy. 25MG  daily. Samples provided   Fifteen minutes of face to face patient care time were spent during this visit. All questions were encouraged and answered. Follow up in 2 months with NP . Greater than 50% of time during this encounter was spent counseling patient/family in regard to HEP, pain control, diet. Marland Kitchen

## 2016-10-25 NOTE — Patient Instructions (Signed)
PLEASE FEEL FREE TO CALL OUR OFFICE WITH ANY PROBLEMS OR QUESTIONS ([phone])      

## 2016-10-25 NOTE — Telephone Encounter (Signed)
4 boxes of sample Movantik with 3 tabs each of the 25 mg Movantik given at appointment. Patient educated and handout given with pt education.

## 2016-10-27 ENCOUNTER — Ambulatory Visit (INDEPENDENT_AMBULATORY_CARE_PROVIDER_SITE_OTHER): Payer: Medicare Other | Admitting: General Practice

## 2016-10-27 ENCOUNTER — Ambulatory Visit (INDEPENDENT_AMBULATORY_CARE_PROVIDER_SITE_OTHER): Payer: Medicare Other | Admitting: Pulmonary Disease

## 2016-10-27 ENCOUNTER — Encounter: Payer: Self-pay | Admitting: Pulmonary Disease

## 2016-10-27 VITALS — BP 110/68 | HR 68 | Ht 66.0 in | Wt 309.0 lb

## 2016-10-27 DIAGNOSIS — J9612 Chronic respiratory failure with hypercapnia: Secondary | ICD-10-CM

## 2016-10-27 DIAGNOSIS — J9611 Chronic respiratory failure with hypoxia: Secondary | ICD-10-CM | POA: Diagnosis not present

## 2016-10-27 DIAGNOSIS — Z5181 Encounter for therapeutic drug level monitoring: Secondary | ICD-10-CM

## 2016-10-27 DIAGNOSIS — G4733 Obstructive sleep apnea (adult) (pediatric): Secondary | ICD-10-CM | POA: Diagnosis not present

## 2016-10-27 DIAGNOSIS — I4891 Unspecified atrial fibrillation: Secondary | ICD-10-CM

## 2016-10-27 LAB — POCT INR: INR: 1.7

## 2016-10-27 NOTE — Progress Notes (Signed)
Subjective:    Patient ID: Blake Summers, male    DOB: 1945/08/09, 71 y.o.   MRN: [id number]  HPI Patient has 8 PY smoking history, quit when he was in his 30s.  He is not diagnosed with asthma or copd.  Patient was diagnosed with OSA in 2000. Unsure severity.  He was on CPAP for 10 yrs and it was switched to BiPap 4-5 yrs ago.  He was placed on a NIV in April 2016 for hypercapneic respiratory failure. He was admitted for awhile, transferred to [facility], then discharged on NIV.  He was admitted in 06/2014 for AoC hypoxemic hypercapneic resp failure.  He was intubated and had a tracheostomy. Has a PMV.  [facility]. He has not been admiited since 02/2015.  He has been on O2 24/7 since July 2013. He is now on 3L o2 24/7.  No h/o CA or DVT.  He is wheelchair bound x 5 yrs 2/2 arthritis in knees. He needs help with ADLs.   He takes Percocet 3x/day and MS Contin. Been on meds at least 5 yrs. Stable dose.    ROV 10/27/16 Patient returns to the office as follow-up on his chronic hypoxemic respiratory failure. Since last seen, I coordinated with his DME company with regards to his ventilator. He was not able to tolerate AVAPS mode so we ended up switching his ventilator to BipaP mode and he is toleratring it and he is significantly improved.  ONO on ventilator (bipap mode) and 2 L o2 did NOT show significant o2 desaturation. No issues with his trache.    Review of Systems  Constitutional: Negative.  Negative for fever and unexpected weight change.  HENT: Negative.  Negative for congestion, dental problem, ear pain, nosebleeds, postnasal drip, rhinorrhea, sinus pressure, sneezing, sore throat and trouble swallowing.   Eyes: Negative.  Negative for redness and itching.  Respiratory: Positive for shortness of breath. Negative for cough, chest tightness and wheezing.   Cardiovascular: Negative.  Negative for palpitations and leg swelling.  Gastrointestinal:  Negative.  Negative for nausea and vomiting.  Endocrine: Negative.   Genitourinary: Negative.  Negative for dysuria.  Musculoskeletal: Negative.  Negative for joint swelling.  Skin: Negative.  Negative for rash.  Allergic/Immunologic: Negative.  Negative for environmental allergies, food allergies and immunocompromised state.  Neurological: Negative.  Negative for headaches.  Hematological: Negative.  Does not bruise/bleed easily.  Psychiatric/Behavioral: Negative.  Negative for dysphoric mood. The patient is not nervous/anxious.          Objective:   Physical Exam Vitals:  Vitals:   10/27/16 1159  BP: 110/68  Pulse: 68  SpO2: 95%  Weight: (!) 309 lb (140.2 kg)  Height: 5\' 6"  (1.676 m)    Constitutional/General:  Morbidly obese, Pleasant, w distress,  Comfortably seating on his wheelchair.  Well kempt  Body mass index is 49.87 kg/m. Wt Readings from Last 3 Encounters:  10/27/16 (!) 309 lb (140.2 kg)  09/22/16 (!) 316 lb (143.3 kg)  09/08/16 (!) 316 lb 3.2 oz (143.4 kg)    HEENT: Pupils equal and reactive to light and accommodation. Anicteric sclerae. Normal nasal mucosa.   No oral  lesions,  mouth clear,  oropharynx clear, no postnasal drip. (-) Oral thrush. No dental caries.  Airway - Mallampati class IV  Neck: No masses. Midline trachea. No JVD, (-) LAD. (-) bruits appreciated. (+)  clean appearing trache with PMV.   Respiratory/Chest: Grossly normal chest. (-) deformity. (-)  Accessory muscle use.  Symmetric expansion. (-) Tenderness on palpation.  Resonant on percussion.  Diminished BS on both lower lung zones. (-) wheezing, rhonchi Crackles at bases.  (-) egophony  Cardiovascular: Regular rate and  rhythm, heart sounds normal, no murmur or gallops, Gr 2 edema  Gastrointestinal:  Normal bowel sounds. Soft, non-tender. No hepatosplenomegaly.  (-) masses.   Musculoskeletal:  Normal muscle tone. Unable to examine gait as he is wheelchair bound.    Extremities: Grossly normal. (-) clubbing, cyanosis.  Gr 2  edema  Skin: (-) rash,lesions seen.   Neurological/Psychiatric : alert, oriented to time, place, person. Normal mood and affect           Assessment & Plan:  Chronic respiratory failure with hypoxia and hypercapnia (HCC) Patient has 8 PY smoking history, quit when he was in his 30s.  He is not diagnosed with asthma or copd.  Patient was diagnosed with OSA in 2000. Unsure severity.  He was on CPAP for 10 yrs and it was switched to BiPap 4-5 yrs ago.   He was placed on a NIV in April 2016 for hypoxemic  hypercapneic respiratory failure. He was admitted for awhile, transferred to [facility], then discharged on NIV.   He was admitted in 06/2014 for AoC hypoxemic hypercapneic resp failure.  He was intubated and had a tracheostomy. Has a PMV.   His last admission was at [facility] in 02/2015. He has not been admiited since 02/2015.  He has been on O2 24/7 since July 2013. He is now on 3L o2 24/7.    He was prescribed the NIV in 2016 but he has been intolerant. He does not think it's a pressure. He feels he cannot catch his breath with NIV. He is uncomfortable with it. He has not been using it for the most part since he was prescribed the ventilator in 2016. Ironically, he got a new NIV machine at the start of the year. RT  has not tweaked his pressures or settings.  Since last seen, I spoke with respiratory therapy from his DME company and discussed with them settings. He could not tolerate AVAPS settings.  We switched him to Bipap settings and he is tolerating it very well and he is improved.  He is currently on BiPaP mode of his ventilaror with PS of 14 cm water, PEEP 8 cm water, back up rate of 14.  His ONO on 2L o2 and these settings were acceptable.    He takes Percocet 3 times a day and MS Contin twice a day. It is for his chronic pain. Pain has been stable.   Plan : 1. Continue with ventilator use on a BiPaP mode.  PS  14 cm water, PEEP 8 cm water, back up rate 14. He is tolerating this mode.  Using o2 2L with ventilator.  2. Recommended no decannulation of tracheostomy for now.  He inflates the cuff when he is on the vent and deflates when he comes off.  3. Ventilator has been approved until 09/2017.   Return to clinic in 09/2017  to see Dr. Craige Cotta or Dr. Vassie Loll.    Pollie Meyer, MD 10/27/2016, 12:40 PM [facility] Pager [phone] After 3 pm or if no answer, call [phone]

## 2016-10-27 NOTE — Assessment & Plan Note (Signed)
Patient has 8 PY smoking history, quit when he was in his 30s.  He is not diagnosed with asthma or copd.  Patient was diagnosed with OSA in 2000. Unsure severity.  He was on CPAP for 10 yrs and it was switched to BiPap 4-5 yrs ago.   He was placed on a NIV in April 2016 for hypoxemic  hypercapneic respiratory failure. He was admitted for awhile, transferred to [facility], then discharged on NIV.   He was admitted in 06/2014 for AoC hypoxemic hypercapneic resp failure.  He was intubated and had a tracheostomy. Has a PMV.   [facility]. He has not been admiited since 02/2015.  He has been on O2 24/7 since July 2013. He is now on 3L o2 24/7.    He was prescribed the NIV in 2016 but he has been intolerant. He does not think it's a pressure. He feels he cannot catch his breath with NIV. He is uncomfortable with it. He has not been using it for the most part since he was prescribed the ventilator in 2016. Ironically, he got a new NIV machine at the start of the year. RT  has not tweaked his pressures or settings.  Since last seen, I spoke with respiratory therapy from his DME company and discussed with them settings. He could not tolerate AVAPS settings.  We switched him to Bipap settings and he is tolerating it very well and he is improved.  He is currently on BiPaP mode of his ventilaror with PS of 14 cm water, PEEP 8 cm water, back up rate of 14.  His ONO on 2L o2 and these settings were acceptable.    He takes Percocet 3 times a day and MS Contin twice a day. It is for his chronic pain. Pain has been stable.   Plan : 1. Continue with ventilator use on a BiPaP mode.  PS 14 cm water, PEEP 8 cm water, back up rate 14. He is tolerating this mode.  Using o2 2L with ventilator.  2. Recommended no decannulation of tracheostomy for now.  He inflates the cuff when he is on the vent and deflates when he comes off.  3. Ventilator has been approved until 09/2017.

## 2016-10-27 NOTE — Progress Notes (Signed)
Unable to walk pt today due to him feeling like he will be unsteady walking and wheelchair bound.

## 2016-10-27 NOTE — Patient Instructions (Addendum)
  It was a pleasure taking care of you today!  Continue using your ventilator.   Please make sure you use your ventilator device everytime you sleep.  We will monitor the usage of your machine per your insurance requirement.  Your insurance company may take the machine from you if you are not using it regularly.   Please clean the mask, tubings, filter, water reservoir with soapy water every week.  Please use distilled water for the water reservoir.   Please call the office or your machine provider (DME company) if you are having issues with the device.   Return to clinic in 09/2017  with Dr. Vassie LollAlva or Dr. Craige CottaSood

## 2016-10-27 NOTE — Patient Instructions (Signed)
Pre visit review using our clinic review tool, if applicable. No additional management support is needed unless otherwise documented below in the visit note. 

## 2016-10-28 NOTE — Progress Notes (Signed)
I have reviewed and agree with the plan. 

## 2016-10-30 ENCOUNTER — Other Ambulatory Visit: Payer: Self-pay | Admitting: Endocrinology

## 2016-10-30 DIAGNOSIS — M109 Gout, unspecified: Secondary | ICD-10-CM

## 2016-10-30 DIAGNOSIS — E1049 Type 1 diabetes mellitus with other diabetic neurological complication: Secondary | ICD-10-CM

## 2016-10-30 DIAGNOSIS — E1065 Type 1 diabetes mellitus with hyperglycemia: Principal | ICD-10-CM

## 2016-10-30 DIAGNOSIS — IMO0002 Reserved for concepts with insufficient information to code with codable children: Secondary | ICD-10-CM

## 2016-10-30 LAB — 6-ACETYLMORPHINE,TOXASSURE ADD
6-ACETYLMORPHINE: NEGATIVE
6-acetylmorphine: NOT DETECTED ng/mg creat

## 2016-10-30 LAB — TOXASSURE SELECT,+ANTIDEPR,UR

## 2016-10-31 ENCOUNTER — Telehealth: Payer: Self-pay | Admitting: *Deleted

## 2016-10-31 NOTE — Telephone Encounter (Signed)
Urine drug screen for this encounter is consistent for prescribed medication.  There are no metabolites listed for Morphine though parent drug is there in large quantity. He would be a candidate for oral swab next test.

## 2016-11-01 ENCOUNTER — Ambulatory Visit (INDEPENDENT_AMBULATORY_CARE_PROVIDER_SITE_OTHER): Payer: Medicare Other | Admitting: Internal Medicine

## 2016-11-01 ENCOUNTER — Other Ambulatory Visit (INDEPENDENT_AMBULATORY_CARE_PROVIDER_SITE_OTHER): Payer: Medicare Other

## 2016-11-01 ENCOUNTER — Encounter: Payer: Self-pay | Admitting: Internal Medicine

## 2016-11-01 VITALS — BP 128/66 | HR 76 | Ht 66.0 in | Wt 313.4 lb

## 2016-11-01 DIAGNOSIS — J9611 Chronic respiratory failure with hypoxia: Secondary | ICD-10-CM

## 2016-11-01 DIAGNOSIS — J9612 Chronic respiratory failure with hypercapnia: Secondary | ICD-10-CM

## 2016-11-01 LAB — CBC WITH DIFFERENTIAL/PLATELET
BASOS PCT: 0.5 % (ref 0.0–3.0)
Basophils Absolute: 0.1 10*3/uL (ref 0.0–0.1)
EOS PCT: 3.6 % (ref 0.0–5.0)
Eosinophils Absolute: 0.5 10*3/uL (ref 0.0–0.7)
HEMATOCRIT: 39.2 % (ref 39.0–52.0)
Hemoglobin: 12.5 g/dL — ABNORMAL LOW (ref 13.0–17.0)
Lymphocytes Relative: 3.6 % — ABNORMAL LOW (ref 12.0–46.0)
Lymphs Abs: 0.5 10*3/uL — ABNORMAL LOW (ref 0.7–4.0)
MCHC: 32 g/dL (ref 30.0–36.0)
MCV: 79.4 fl (ref 78.0–100.0)
MONO ABS: 1 10*3/uL (ref 0.1–1.0)
Monocytes Relative: 7.9 % (ref 3.0–12.0)
NEUTROS ABS: 10.8 10*3/uL — AB (ref 1.4–7.7)
Neutrophils Relative %: 84.4 % — ABNORMAL HIGH (ref 43.0–77.0)
PLATELETS: 264 10*3/uL (ref 150.0–400.0)
RBC: 4.93 Mil/uL (ref 4.22–5.81)
RDW: 21.8 % — AB (ref 11.5–15.5)
WBC: 12.8 10*3/uL — ABNORMAL HIGH (ref 4.0–10.5)

## 2016-11-01 LAB — BASIC METABOLIC PANEL
BUN: 21 mg/dL (ref 6–23)
CO2: 32 mEq/L (ref 19–32)
CREATININE: 0.9 mg/dL (ref 0.40–1.50)
Calcium: 8.8 mg/dL (ref 8.4–10.5)
Chloride: 104 mEq/L (ref 96–112)
GFR: 88.22 mL/min (ref >60.00)
Glucose, Bld: 88 mg/dL (ref 70–99)
POTASSIUM: 3.7 meq/L (ref 3.5–5.1)
SODIUM: 143 meq/L (ref 135–145)

## 2016-11-01 NOTE — Progress Notes (Signed)
Subjective:     Patient ID: Blake Summers, male   DOB: 02-06-1946, .   MRN: [id number]     Brief patient profile:  70yowm OHS  baseline  sleeping 45 degreees HOB on home vent and 02 and during  Day just 02 3lpm and passi muir valve         History of Present Illness  12/22/2015  f/u ov/Blake Summers re: chronic resp falure/ ohs/ vent dep hs only / no resp rx x 3lpm at home/ 4 lpm with activity  Chief Complaint  Patient presents with  . Follow-up  walking across the house s 02 eg to bathroom but does not check 02 then/ wants POC rec Ok to try off the vent a few nights a week unless more groggy / headache in am or less energy during the day but make sure your elevation is the same or higher  Keep working on the weight    10/27/16 [employer] rec: Continue using your ventilator.  Please make sure you use your ventilator device everytime you sleep.  We will monitor the usage of your machine per your insurance requirement.  Your insurance company may take the machine from you if you are not using it regularly.  Please clean the mask, tubings, filter, water reservoir with soapy water every week.  Please use distilled water for the water reservoir.  Please call the office or your machine provider (DME company) if you are having issues with the device.  Return to clinic in 09/2017  with Blake Summers or Blake Summers    11/01/2016  f/u ov/Blake Summers re:  OHS / noct bipap 2lpm 24/7  Chief Complaint  Patient presents with  . Follow-up    Breathing has been doing well. No new co's.         Transfer from bed walker and with walker can barely et  across a room before giving out so uses w/c for all other activity Following Blake Summers' recs and much better sleeping now on present settings  No obvious day to day or daytime variability or assoc excess/ purulent sputum or mucus plugs or hemoptysis or cp or chest tightness, subjective wheeze or overt sinus or hb symptoms. No unusual exp hx or h/o childhood pna/ asthma or  knowledge of premature birth.  Sleeping ok at 45 degrees on vent without nocturnal  or early am exacerbation  of respiratory  c/o's or need for noct saba. Also denies any obvious fluctuation of symptoms with weather or environmental changes or other aggravating or alleviating factors except as outlined above   Current Medications, Allergies, Complete Past Medical History, Past Surgical History, Family History, and Social History were reviewed in [employer] record.  ROS  The following are not active complaints unless bolded sore throat, dysphagia, dental problems, itching, sneezing,  nasal congestion or excess/ purulent secretions, ear ache,   fever, chills, sweats, unintended wt loss, classically pleuritic or exertional cp,  orthopnea pnd or leg swelling, presyncope, palpitations, abdominal pain, anorexia, nausea, vomiting, diarrhea  or change in bowel or bladder habits, change in stools or urine, dysuria,hematuria,  rash, arthralgias, visual complaints, headache, numbness, weakness or ataxia or problems with walking or coordination,  change in mood/affect or memory.                  Objective:  Physical Exam   Obese  W/c bound massively obese Wm nad on T collar PMV/  nasal 02   03/05/2015  304  > 06/04/2015   298 > 09/22/2015  307 >  12/22/2015   320 > 07/05/2016   318 > 11/01/2016  313    01/04/15 322 lb (146.058 kg)  12/08/14 310 lb (140.615 kg)  11/12/14 302 lb (136.986 kg)    Vital signs reviewed  - Note on arrival 02 sats  94% on 2lpm       HEENT: nl dentition, turbinates, and oropharynx. Nl external ear canals without cough reflex   NECK :  without JVD/Nodes/TM/ nl carotid upstrokes bilaterally/ trach with pmv in place    LUNGS: no acc muscle use, distant bs bilaterally without cough on insp or exp maneuvers   CV:  RRR  no s3 or murmur or increase in P2, no edema but both legs tightly wrapped in ace material   ABD:  Massively obese  soft and  nontender with very limited insp excursion. No bruits or organomegaly, bowel sounds nl  MS:  warm without deformities, calf tenderness, cyanosis or clubbing  SKIN: warm and dry without lesions    NEURO:  alert, approp, no deficits          Labs ordered/ reviewed:      Chemistry      Component Value Date/Time   NA 143 11/01/2016 1458   K 3.7 11/01/2016 1458   CL 104 11/01/2016 1458   CO2 32 11/01/2016 1458   BUN 21 11/01/2016 1458   CREATININE 0.90 11/01/2016 1458   CREATININE 0.91 09/06/2015 1541      Component Value Date/Time   CALCIUM 8.8 11/01/2016 1458   CALCIUM 8.6 07/28/2011 1106   ALKPHOS 84 05/17/2016 1426   AST 12 05/17/2016 1426   ALT 11 05/17/2016 1426   BILITOT 0.3 05/17/2016 1426        Lab Results  Component Value Date   WBC 12.8 (H) 11/01/2016   HGB 12.5 (L) 11/01/2016   HCT 39.2 11/01/2016   MCV 79.4 11/01/2016   PLT 264.0 11/01/2016       Lab Results  Component Value Date   TSH 1.14 05/17/2016               Assessment:

## 2016-11-01 NOTE — Patient Instructions (Addendum)
Please remember to go to the lab  department downstairs in the basement  for your tests - we will call you with the results when they are available.  Follow up with Dr Craige CottaSood in one year - call sooner if needed

## 2016-11-01 NOTE — Progress Notes (Signed)
Spoke with pt and notified of results per Dr. Wert. Pt verbalized understanding and denied any questions. 

## 2016-11-02 ENCOUNTER — Encounter: Payer: Self-pay | Admitting: Internal Medicine

## 2016-11-02 NOTE — Assessment & Plan Note (Addendum)
Prior Vent settings: S9 VPAP 14/10 [facility] Admit 06/2014 with worsening RF, required trach and nocturnal vent, required [facility] until 10/08/14. 3 liters pulsed exertion and rest, 3 liters with vpap at bedtime. Per last entry 01/2014 but since then was admitted/ dc from [facility]  - Trach  per Suszanne Connerseoh 07/14/14  - HC03 35  On  01/04/15 despite noct vent  On nocturnal vent as of 01/05/15 : [id number] Settings SIMV/VC, Rate=6, PEEP=+5, PSV=12, VT=500, with 3LPM O2 - HC03  05/31/15 = 39  Down to 32 09/06/15 c/w improving hypercarbia - Req POC 12/22/2015 >>>  - HC03  =   07/05/2016  = 33 off vent x one month  - HC03  11/01/2016   = 32 back on noct bipap per Dr DeDios   bipap 14i/8e and backup rate 14 with 2lpm 24/7    Doing well on present settings and no pulmonary meds at all > f/u with Dr Craige CottaSood yearly planned - we can see him in interim as needed  Each maintenance medication was reviewed in detail including most importantly the difference between maintenance and as needed and under what circumstances the prns are to be used.  Please see AVS for specific  Instructions which are unique to this visit and I personally typed out  which were reviewed in detail in writing with the patient and a copy provided.

## 2016-11-02 NOTE — Assessment & Plan Note (Signed)
Complicated by HBP/ Hyperlipidemia/ DM / OHS  Body mass index is 50.58 kg/m.  -  trending down minimally, I encouraged him  But he is fatalistic about wt  Lab Results  Component Value Date   TSH 1.14 05/17/2016     Contributing to gerd risk/ doe/reviewed the need and the process to achieve and maintain neg calorie balance > defer f/u primary care including intermittently monitoring thyroid status

## 2016-11-06 ENCOUNTER — Other Ambulatory Visit: Payer: Self-pay | Admitting: Internal Medicine

## 2016-11-06 DIAGNOSIS — M109 Gout, unspecified: Secondary | ICD-10-CM

## 2016-11-06 DIAGNOSIS — E1065 Type 1 diabetes mellitus with hyperglycemia: Principal | ICD-10-CM

## 2016-11-06 DIAGNOSIS — E1040 Type 1 diabetes mellitus with diabetic neuropathy, unspecified: Secondary | ICD-10-CM

## 2016-11-06 DIAGNOSIS — IMO0002 Reserved for concepts with insufficient information to code with codable children: Secondary | ICD-10-CM

## 2016-11-24 ENCOUNTER — Ambulatory Visit (INDEPENDENT_AMBULATORY_CARE_PROVIDER_SITE_OTHER): Payer: Medicare Other | Admitting: General Practice

## 2016-11-24 DIAGNOSIS — Z5181 Encounter for therapeutic drug level monitoring: Secondary | ICD-10-CM

## 2016-11-24 DIAGNOSIS — I4891 Unspecified atrial fibrillation: Secondary | ICD-10-CM

## 2016-11-24 LAB — POCT INR: INR: 1.8

## 2016-11-24 NOTE — Patient Instructions (Signed)
Pre visit review using our clinic review tool, if applicable. No additional management support is needed unless otherwise documented below in the visit note. 

## 2016-12-21 ENCOUNTER — Other Ambulatory Visit: Payer: Self-pay | Admitting: Endocrinology

## 2016-12-21 IMAGING — CR DG CHEST 2V
3 series · 3 of 3 positions shown · non-contrast
Comparison: July 13, 2014.

CLINICAL DATA: Dyspnea.

EXAM:
CHEST  2 VIEW

[view not recorded (1 of 3)]
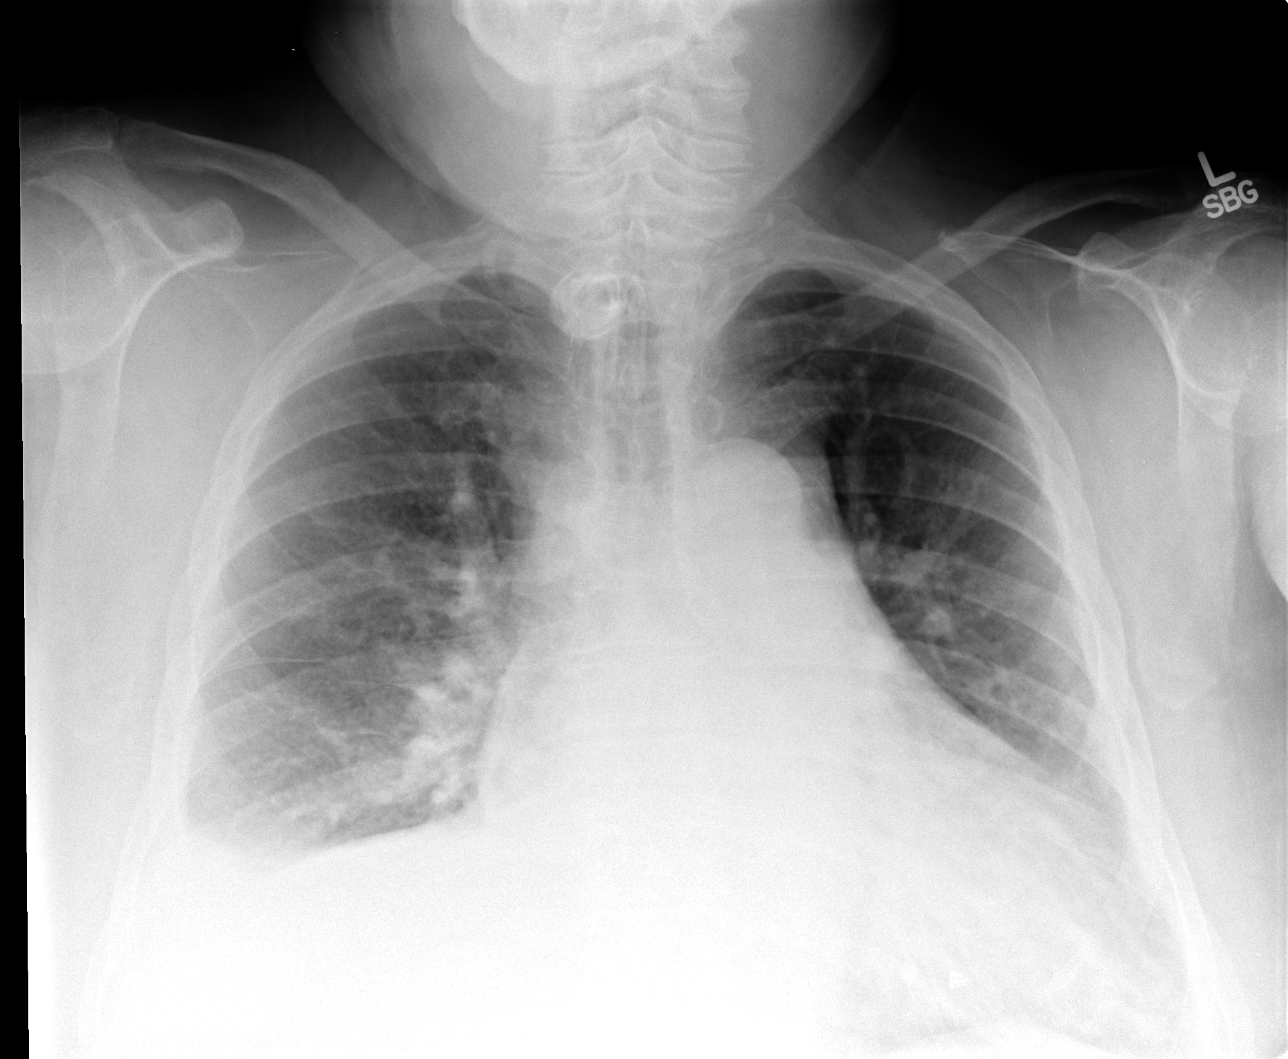

[view not recorded (2 of 3)]
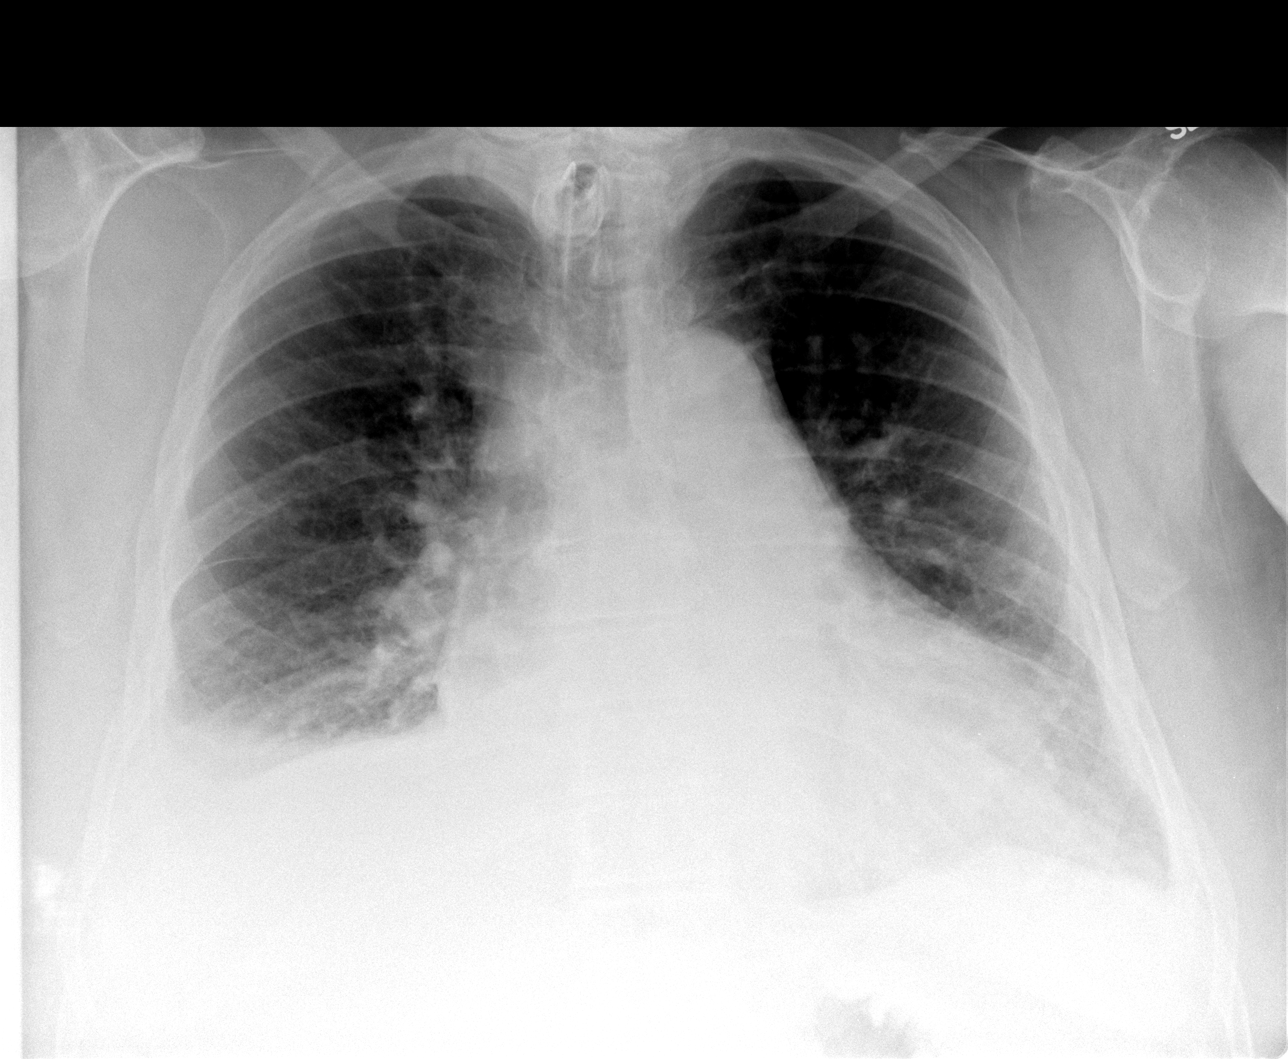

[view not recorded (3 of 3)]
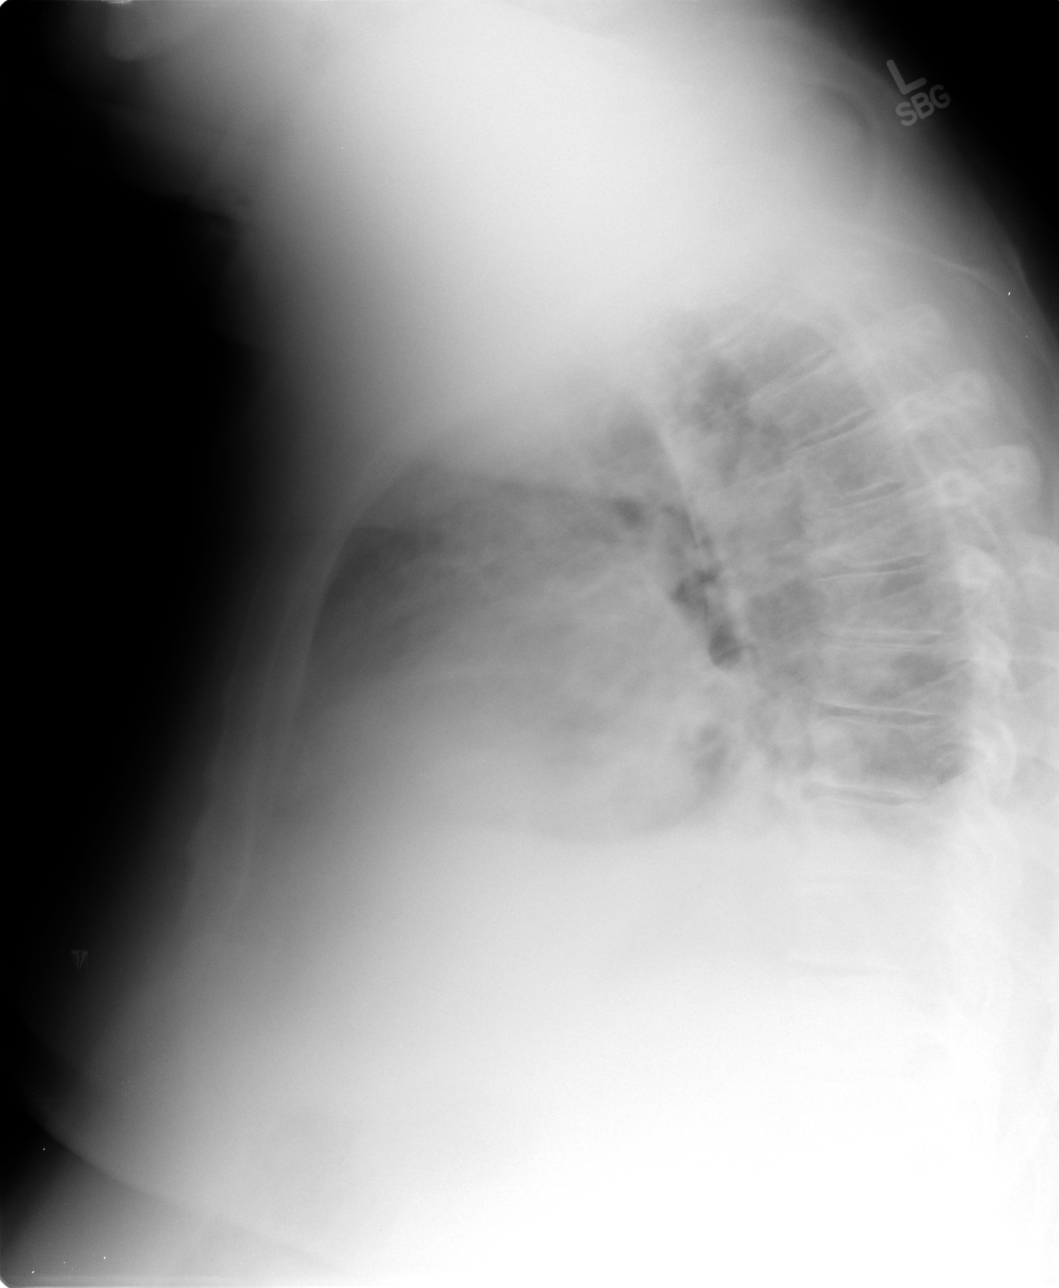

[3 of 3 positions shown; findings below may reference images not displayed]

FINDINGS: Stable cardiomegaly. Tracheostomy in grossly good position. No
pneumothorax is noted. Left lung is clear. Mild right lateral
basilar opacity is noted concerning for pneumonia or atelectasis
with associated pleural effusion. Bony thorax is intact.
IMPRESSION: Mild right lateral basilar opacity concerning for pneumonia or
atelectasis with associated pleural effusion. Follow-up radiographs
are recommended to ensure resolution.

## 2016-12-22 ENCOUNTER — Ambulatory Visit (INDEPENDENT_AMBULATORY_CARE_PROVIDER_SITE_OTHER): Payer: Medicare Other | Admitting: Endocrinology

## 2016-12-22 ENCOUNTER — Encounter: Payer: Self-pay | Admitting: Endocrinology

## 2016-12-22 ENCOUNTER — Ambulatory Visit: Payer: Medicare Other

## 2016-12-22 ENCOUNTER — Ambulatory Visit (INDEPENDENT_AMBULATORY_CARE_PROVIDER_SITE_OTHER): Payer: Medicare Other | Admitting: General Practice

## 2016-12-22 VITALS — BP 142/84 | HR 72 | Ht 66.0 in | Wt 324.0 lb

## 2016-12-22 DIAGNOSIS — Z5181 Encounter for therapeutic drug level monitoring: Secondary | ICD-10-CM | POA: Diagnosis not present

## 2016-12-22 DIAGNOSIS — I4891 Unspecified atrial fibrillation: Secondary | ICD-10-CM | POA: Diagnosis not present

## 2016-12-22 DIAGNOSIS — E1159 Type 2 diabetes mellitus with other circulatory complications: Secondary | ICD-10-CM | POA: Diagnosis not present

## 2016-12-22 DIAGNOSIS — Z794 Long term (current) use of insulin: Secondary | ICD-10-CM

## 2016-12-22 LAB — POCT INR: INR: 3.1

## 2016-12-22 LAB — POCT GLYCOSYLATED HEMOGLOBIN (HGB A1C): HEMOGLOBIN A1C: 6.7

## 2016-12-22 NOTE — Progress Notes (Signed)
Subjective:    Patient ID: Blake Summers, male    DOB: 01-01-1946, 71 y.o.   MRN: [id number]  HPI Pt returns for f/u of diabetes mellitus: DM type: Insulin-requiring type 2 Dx'ed: 1990 Complications: polyneuropathy, CAD, CVA, and foot ulcer.   Therapy: insulin since 1999 DKA: never Severe hypoglycemia: never Pancreatitis: never Other: he takes multiple daily injections; he was turned down for weight-loss surgery, due to comorbidities.   Interval history: He has mild hypoglycemia approx once per month.  He takes medrol 4 mg qd.  He says cbg's have increased to the mid to high-100's over the past few weeks.   Past Medical History:  Diagnosis Date  . ALLERGIC RHINITIS 07/08/2008  . BENIGN PROSTATIC HYPERTROPHY 07/08/2008  . CEREBROVASCULAR ACCIDENT, HX OF 07/08/2008  . DEGENERATIVE JOINT DISEASE 05/24/2009   R knee, end stage  . DIABETES MELLITUS, TYPE II 05/04/2007  . DIABETIC ULCER, LEFT LEG 08/23/2009  . Diarrhea 06/08/2013  . DM nephropathy/sclerosis   . Edema 10/28/2007  . 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 FATTY LIVER DISEASE 05/19/2008  . GERD 07/06/2010  . GLAUCOMA 07/08/2008  . HYPERLIPIDEMIA 07/08/2008  . HYPERTENSION 10/28/2007  . 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 Lumbar spondylosis   . Morbid obesity (HCC)   . NEPHROLITHIASIS, HX OF 07/08/2008  . OBSTRUCTIVE SLEEP APNEA 03/06/2008  . VENOUS INSUFFICIENCY 03/08/2009    Past Surgical History:  Procedure Laterality Date  . LITHOTRIPSY  2007  . TRACHEOSTOMY TUBE PLACEMENT N/A 07/14/2014   Procedure: TRACHEOSTOMY;  Surgeon: Darletta MollSui W Teoh, MD;  [facility];  Service: ENT;  Laterality: N/A;    Social History   Social History  . Marital status: Married    Spouse name: N/A  . Number of children: N/A  . Years of education: N/A   Occupational History  . Retired Retired    Nature conservation officeroperations manager   Social History Main Topics  . Smoking status: Former Smoker    Packs/day: 0.50    Years: 15.00    Types: Cigarettes   Quit date: 06/12/1988  . Smokeless tobacco: Never Used  . Alcohol use No  . Drug use: No  . Sexual activity: Not on file   Other Topics Concern  . Not on file   Social History Narrative   Diet is "good"   Exercise is limited by medical problems    Current Outpatient Prescriptions on File Prior to Visit  Medication Sig Dispense Refill  . brimonidine (ALPHAGAN) 0.15 % ophthalmic solution Place 1 drop into both eyes 2 (two) times daily.     Marland Kitchen. DEXILANT 60 MG capsule TAKE 1 CAPSULE BY MOUTH DAILY 30 capsule 11  . dorzolamide-timolol (COSOPT) 22.3-6.8 MG/ML ophthalmic solution Place 1 drop into both eyes 2 (two) times daily.     . famotidine (PEPCID) 20 MG tablet TAKE 1 TABLET BY MOUTH 2 TIMES A DAY 60 tablet 11  . furosemide (LASIX) 80 MG tablet TAKE 1 TABLET BY MOUTH 2 TIMES A DAY 60 tablet 11  . insulin aspart (NOVOLOG FLEXPEN) 100 UNIT/ML FlexPen 3 times a day (just before each meal) 30-15-30 units. 30 mL 4  . Insulin Glargine (LANTUS SOLOSTAR) 100 UNIT/ML Solostar Pen Inject 50 Units into the skin at bedtime. 30 mL 2  . latanoprost (XALATAN) 0.005 % ophthalmic solution Place 1 drop into both eyes at bedtime. 2.5 mL 4  . lidocaine (XYLOCAINE) 2 % jelly Apply 1 application topically 3 (three) times daily. As needed for pain 40 mL 11  .  methylPREDNISolone (MEDROL) 4 MG tablet Take 4 mg by mouth daily.    Marland Kitchen morphine (MS CONTIN) 15 MG 12 hr tablet Take 1 tablet (15 mg total) by mouth every 12 (twelve) hours. 60 tablet 0  . NOVOFINE 30G X 8 MM MISC USE 4 TIMES A DAY 120 each 11  . ONETOUCH VERIO test strip CHECK BLOOD SUGAR 3 TIMES DAILY 100 each 5  . oxyCODONE-acetaminophen (PERCOCET) 7.5-325 MG tablet Take 1 tablet by mouth every 8 (eight) hours as needed (pain). 90 tablet 0  . OXYGEN Oxygen 3-4 lpm  Bipap/Vent  with sleep    . polyethylene glycol powder (GLYCOLAX/MIRALAX) powder MIX 17 GRAMS (TO LINE INSIDE LID) IN 8 TO 10 OUNCES OF LIQUID AND DRINK EVERY DAY AS PHYSICIAN INSTRUCTED 527 g  11  . pravastatin (PRAVACHOL) 40 MG tablet TAKE 1 TABLET BY MOUTH DAILY AT 6PM 90 tablet 0  . SENNA-TABS 8.6 MG tablet TAKE 1 TABLET BY MOUTH DAILY AS NEEDED FOR MILD CONSTIPATION 50 tablet 11  . tamsulosin (FLOMAX) 0.4 MG CAPS capsule TAKE 1 CAPSULE BY MOUTH DAILY 30 capsule 11  . topiramate (TOPAMAX) 50 MG tablet TAKE 1 TABLET BY MOUTH 2 TIMES A DAY 60 tablet 1  . warfarin (COUMADIN) 6 MG tablet TAKE UP TO 2 TABLETS DAILY OR AS DIRECTED BY THE COUMADIN CLINIC 60 tablet 3   No current facility-administered medications on file prior to visit.     Allergies  Allergen Reactions  . Neosporin [Neomycin-Polymyxin-Gramicidin] Other (See Comments)    unknown    Family History  Problem Relation Age of Onset  . Asthma Mother   . Colon cancer Brother   . Heart disease Brother   . Heart disease Brother     BP (!) 142/84   Pulse 72   Ht 5\' 6"  (1.676 m)   Wt (!) 324 lb (147 kg)   SpO2 93% Comment: On 2 L of 02  BMI 52.29 kg/m    Review of Systems He denies recent hypoglycemia    Objective:   Physical Exam VITAL SIGNS:  See vs page GENERAL: no distress.  In wheelchair.  Has 02 on Ext: feet are wrapped (sees wound care in [place]).     A1c=6.7%    Assessment & Plan:  Insulin-requiring type 2 DM, with CAD: rx is complicated by steroid rx.  I told his he should continue medrol--we'll work around that. Please continue the same insulin for now.    Patient Instructions  Please continue the same insulins.  check your blood sugar twice a day.  vary the time of day when you check, between before the 3 meals, and at bedtime.  also check if you have symptoms of your blood sugar being too high or too low.  please keep a record of the readings and bring it to your next appointment here (or you can bring the meter itself).  You can write it on any piece of paper.  please call us sooner if your blood sugar goes below 70, or if you have a lot of readings over 200.  Please come back for a follow-up  appointment in 3 months.

## 2016-12-22 NOTE — Patient Instructions (Addendum)
Please continue the same insulins.  check your blood sugar twice a day.  vary the time of day when you check, between before the 3 meals, and at bedtime.  also check if you have symptoms of your blood sugar being too high or too low.  please keep a record of the readings and bring it to your next appointment here (or you can bring the meter itself).  You can write it on any piece of paper.  please call us sooner if your blood sugar goes below 70, or if you have a lot of readings over 200. Please come back for a follow-up appointment in 3 months.   

## 2016-12-22 NOTE — Patient Instructions (Signed)
Pre visit review using our clinic review tool, if applicable. No additional management support is needed unless otherwise documented below in the visit note. 

## 2016-12-25 ENCOUNTER — Encounter: Payer: Medicare Other | Admitting: Physical Medicine & Rehabilitation

## 2017-01-03 ENCOUNTER — Encounter: Payer: Self-pay | Admitting: Physical Medicine & Rehabilitation

## 2017-01-03 ENCOUNTER — Encounter: Payer: Medicare Other | Attending: Registered Nurse | Admitting: Physical Medicine & Rehabilitation

## 2017-01-03 ENCOUNTER — Ambulatory Visit: Payer: Medicare Other | Admitting: Physical Medicine & Rehabilitation

## 2017-01-03 VITALS — BP 166/102 | HR 77

## 2017-01-03 DIAGNOSIS — K5903 Drug induced constipation: Secondary | ICD-10-CM

## 2017-01-03 DIAGNOSIS — G609 Hereditary and idiopathic neuropathy, unspecified: Secondary | ICD-10-CM | POA: Diagnosis not present

## 2017-01-03 DIAGNOSIS — M179 Osteoarthritis of knee, unspecified: Secondary | ICD-10-CM | POA: Diagnosis not present

## 2017-01-03 DIAGNOSIS — E662 Morbid (severe) obesity with alveolar hypoventilation: Secondary | ICD-10-CM

## 2017-01-03 DIAGNOSIS — T402X5A Adverse effect of other opioids, initial encounter: Secondary | ICD-10-CM | POA: Diagnosis not present

## 2017-01-03 DIAGNOSIS — Z760 Encounter for issue of repeat prescription: Secondary | ICD-10-CM | POA: Diagnosis not present

## 2017-01-03 DIAGNOSIS — M1711 Unilateral primary osteoarthritis, right knee: Secondary | ICD-10-CM

## 2017-01-03 DIAGNOSIS — G8929 Other chronic pain: Secondary | ICD-10-CM | POA: Diagnosis present

## 2017-01-03 DIAGNOSIS — Z930 Tracheostomy status: Secondary | ICD-10-CM | POA: Diagnosis not present

## 2017-01-03 DIAGNOSIS — M25561 Pain in right knee: Secondary | ICD-10-CM | POA: Diagnosis not present

## 2017-01-03 DIAGNOSIS — T40605A Adverse effect of unspecified narcotics, initial encounter: Secondary | ICD-10-CM | POA: Diagnosis not present

## 2017-01-03 DIAGNOSIS — K5909 Other constipation: Secondary | ICD-10-CM | POA: Insufficient documentation

## 2017-01-03 DIAGNOSIS — M545 Low back pain: Secondary | ICD-10-CM | POA: Diagnosis not present

## 2017-01-03 DIAGNOSIS — M1712 Unilateral primary osteoarthritis, left knee: Secondary | ICD-10-CM

## 2017-01-03 DIAGNOSIS — J9620 Acute and chronic respiratory failure, unspecified whether with hypoxia or hypercapnia: Secondary | ICD-10-CM | POA: Insufficient documentation

## 2017-01-03 MED ORDER — OXYCODONE-ACETAMINOPHEN 7.5-325 MG PO TABS: 1.0000 | ORAL_TABLET | Freq: Three times a day (TID) | ORAL | 0 refills | Status: DC | PRN

## 2017-01-03 MED ORDER — MORPHINE SULFATE ER 15 MG PO TBCR: 15.0000 mg | EXTENDED_RELEASE_TABLET | Freq: Two times a day (BID) | ORAL | 0 refills | Status: DC

## 2017-01-03 NOTE — Progress Notes (Signed)
Subjective:    Patient ID: Blake Summers, male    DOB: Sep 28, 1945, 71 y.o.   MRN: [id number]  HPI   Blake Summers is here in follow up of his chronic pain. Constipation is still an issue. He didn't tolerate movantik due to diarrhea. He remains on miralax and senokot-s daily but still has hard stools. His pain levels have been fairly stable. His activity levels remain limited due to his pulmonary status and chronic leg wounds. His leg wounds do seem to be improving gradually  Blake Summers remains on percocet 7.5mg  prn in addition to ms contin IR 15mg  q12.  Pain Inventory Average Pain 7 Pain Right Now 7 My pain is sharp, stabbing and aching  In the last 24 hours, has pain interfered with the following? General activity 8 Relation with others 9 Enjoyment of life 9 What TIME of day is your pain at its worst? daytime Sleep (in general) Poor  Pain is worse with: walking and standing Pain improves with: rest, heat/ice, medication, TENS and injections Relief from Meds: 8  Mobility walk with assistance use a walker ability to climb steps?  no do you drive?  yes  Function retired I need assistance with the following:  dressing, bathing, toileting, meal prep, household duties and shopping  Neuro/Psych weakness numbness tingling trouble walking dizziness  Prior Studies Any changes since last visit?  no  Physicians involved in your care Any changes since last visit?  no   Family History  Problem Relation Age of Onset  . Asthma Mother   . Colon cancer Brother   . Heart disease Brother   . Heart disease Brother    Social History   Social History  . Marital status: Married    Spouse name: N/A  . Number of children: N/A  . Years of education: N/A   Occupational History  . Retired Retired    Nature conservation officeroperations manager   Social History Main Topics  . Smoking status: Former Smoker    Packs/day: 0.50    Years: 15.00    Types: Cigarettes    Quit date: 06/12/1988  . Smokeless  tobacco: Never Used  . Alcohol use No  . Drug use: No  . Sexual activity: Not Asked   Other Topics Concern  . None   Social History Narrative   Diet is "good"   Exercise is limited by medical problems   Past Surgical History:  Procedure Laterality Date  . LITHOTRIPSY  2007  . TRACHEOSTOMY TUBE PLACEMENT N/A 07/14/2014   Procedure: TRACHEOSTOMY;  Surgeon: Darletta MollSui W Teoh, MD;  [facility];  Service: ENT;  Laterality: N/A;   Past Medical History:  Diagnosis Date  . ALLERGIC RHINITIS 07/08/2008  . BENIGN PROSTATIC HYPERTROPHY 07/08/2008  . CEREBROVASCULAR ACCIDENT, HX OF 07/08/2008  . DEGENERATIVE JOINT DISEASE 05/24/2009   R knee, end stage  . DIABETES MELLITUS, TYPE II 05/04/2007  . DIABETIC ULCER, LEFT LEG 08/23/2009  . Diarrhea 06/08/2013  . DM nephropathy/sclerosis   . Edema 10/28/2007  . 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 FATTY LIVER DISEASE 05/19/2008  . GERD 07/06/2010  . GLAUCOMA 07/08/2008  . HYPERLIPIDEMIA 07/08/2008  . HYPERTENSION 10/28/2007  . 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 Lumbar spondylosis   . Morbid obesity (HCC)   . NEPHROLITHIASIS, HX OF 07/08/2008  . OBSTRUCTIVE SLEEP APNEA 03/06/2008  . VENOUS INSUFFICIENCY 03/08/2009   Pulse 77   SpO2 94%   Opioid Risk Score:   Fall Risk Score:  `1  Depression screen PHQ  2/9  Depression screen PHQ 2/9 02/23/2016 06/28/2015 02/16/2015 02/04/2015 12/29/2014 12/08/2014 10/20/2014  Decreased Interest 0 0 0 0 0 0 0  Down, Depressed, Hopeless 0 0 0 0 0 0 0  PHQ - 2 Score 0 0 0 0 0 0 0  Altered sleeping - - - - - - 0  Tired, decreased energy - - - - - - 2  Change in appetite - - - - - - 0  Feeling bad or failure about yourself  - - - - - - 0  Trouble concentrating - - - - - - 0  Moving slowly or fidgety/restless - - - - - - 0  Suicidal thoughts - - - - - - 0  PHQ-9 Score - - - - - - 2  Some recent data might be hidden     Review of Systems  Constitutional: Negative.   HENT: Negative.   Eyes: Negative.   Respiratory:  Negative.   Cardiovascular: Negative.   Gastrointestinal: Negative.   Endocrine: Negative.   Genitourinary: Negative.   Musculoskeletal: Negative.   Skin: Negative.   Allergic/Immunologic: Negative.   Neurological: Negative.   Hematological: Negative.   Psychiatric/Behavioral: Negative.   All other systems reviewed and are negative.      Objective:   Physical Exam  Constitutional: AxO x 3. Morbidly obese HENT:  Head: Normocephalic and atraumatic.  Eyes: Conjunctivae are normal. Pupils are equal, round, and reactive to light.  Neck: #6 XL shiley trach with PMV---no change-  Cardiovascular: RRR  Pulmonary/Chest: wearing oxygen, normal effort  Abdominal: Soft. Bowel sounds are normal. He exhibits no distension. There is no tenderness.  Musculoskeletal:  MMT: 5/5 muscle strength of the upper extremity,   3+ to 4/5 of the lower extremity.  Lower extremities dressed/wearing sandals  Neurological: He is alert and oriented to person, place, and time. Tinel's + left wrist. He is not earing splints.  Skin: both legs dressed with unna wraps. .  Psychiatric: Pleasant. His behavior is normal. Thought content normal.    Assessment & Plan:  1. End-stage right knee osteoarthritis: Continue to use lidocaine patches.  Refilled: MS contin 15 mg #60 bid.  Percocet 7.5/325 mg # 90 pills--q6 prn. Second rx'es were provided for next month. We will continue the opioid monitoring program, this consists of regular clinic visits, examinations, urine drug screen, pill counts as well as use of [place] Cont[place]. [place].   2. Morbid obesity with generalized deconditioning  -continue dietary changes 3. Chronic low back pain/spondylosis:  -posture and mechanics was discussed 4. Trach dependent respiratory failure: continue bipap/vent per pulmonary 5. Neuropathy and CTS---continued DAILY splinting, diabetes controlled---probably not a great  surgical candidate -continue hs topamax, 50mg  qhs  6. Narcotic induced constipation: miralax/senna and diet modifications  -chronulac---RECOMMENDED DAILY USE TO ASSIST WITH REGULAR BM'S -can increase miralax to bid if he would like as well.  -consider fiber supplementation  Fifteen minutes of face to face patient care time were spent during this visit. All questions were encouraged and answered. Follow up in 2 months with NP . Greater than 50% of time during this encounter was spent counseling patient/family in regard to bowel regimen, skin care, pain control.

## 2017-01-03 NOTE — Patient Instructions (Signed)
TRY DAILY LACTULOSE TO HELP WITH YOUR BM'S.  YOU ALSO COULD INCREASE MIRALAX ADJUST YOUR REGIMEN AS NEEDED.

## 2017-01-04 ENCOUNTER — Other Ambulatory Visit: Payer: Self-pay | Admitting: Internal Medicine

## 2017-01-04 ENCOUNTER — Other Ambulatory Visit: Payer: Self-pay | Admitting: Endocrinology

## 2017-01-04 NOTE — Telephone Encounter (Signed)
Please refer to cardiol

## 2017-01-09 ENCOUNTER — Other Ambulatory Visit: Payer: Self-pay | Admitting: Endocrinology

## 2017-01-19 ENCOUNTER — Ambulatory Visit: Payer: Medicare Other

## 2017-01-19 ENCOUNTER — Ambulatory Visit (INDEPENDENT_AMBULATORY_CARE_PROVIDER_SITE_OTHER): Payer: Medicare Other

## 2017-01-19 DIAGNOSIS — Z5181 Encounter for therapeutic drug level monitoring: Secondary | ICD-10-CM

## 2017-01-19 DIAGNOSIS — I4891 Unspecified atrial fibrillation: Secondary | ICD-10-CM

## 2017-01-19 LAB — POCT INR: INR: 3.5

## 2017-01-19 NOTE — Patient Instructions (Signed)
Pre visit review using our clinic review tool, if applicable. No additional management support is needed unless otherwise documented below in the visit note. 

## 2017-01-19 NOTE — Progress Notes (Signed)
I have reviewed and agree with the plan. 

## 2017-01-29 ENCOUNTER — Other Ambulatory Visit: Payer: Self-pay | Admitting: Internal Medicine

## 2017-02-09 ENCOUNTER — Ambulatory Visit (INDEPENDENT_AMBULATORY_CARE_PROVIDER_SITE_OTHER): Payer: Medicare Other | Admitting: General Practice

## 2017-02-09 DIAGNOSIS — Z5181 Encounter for therapeutic drug level monitoring: Secondary | ICD-10-CM | POA: Diagnosis not present

## 2017-02-09 DIAGNOSIS — I4891 Unspecified atrial fibrillation: Secondary | ICD-10-CM

## 2017-02-09 LAB — POCT INR: INR: 2.2

## 2017-02-09 NOTE — Progress Notes (Signed)
I have reviewed and agree with the plan. 

## 2017-02-09 NOTE — Patient Instructions (Signed)
Pre visit review using our clinic review tool, if applicable. No additional management support is needed unless otherwise documented below in the visit note. 

## 2017-02-10 ENCOUNTER — Other Ambulatory Visit: Payer: Self-pay | Admitting: Endocrinology

## 2017-02-16 ENCOUNTER — Other Ambulatory Visit: Payer: Self-pay | Admitting: Endocrinology

## 2017-02-19 ENCOUNTER — Other Ambulatory Visit: Payer: Self-pay | Admitting: Endocrinology

## 2017-02-27 ENCOUNTER — Other Ambulatory Visit: Payer: Self-pay | Admitting: Endocrinology

## 2017-02-28 ENCOUNTER — Encounter: Payer: Medicare Other | Attending: Registered Nurse | Admitting: Registered Nurse

## 2017-02-28 ENCOUNTER — Encounter: Payer: Self-pay | Admitting: Registered Nurse

## 2017-02-28 VITALS — BP 145/72 | HR 73

## 2017-02-28 DIAGNOSIS — M179 Osteoarthritis of knee, unspecified: Secondary | ICD-10-CM | POA: Diagnosis not present

## 2017-02-28 DIAGNOSIS — G609 Hereditary and idiopathic neuropathy, unspecified: Secondary | ICD-10-CM

## 2017-02-28 DIAGNOSIS — J9620 Acute and chronic respiratory failure, unspecified whether with hypoxia or hypercapnia: Secondary | ICD-10-CM | POA: Diagnosis not present

## 2017-02-28 DIAGNOSIS — G8929 Other chronic pain: Secondary | ICD-10-CM | POA: Diagnosis present

## 2017-02-28 DIAGNOSIS — T402X5A Adverse effect of other opioids, initial encounter: Secondary | ICD-10-CM

## 2017-02-28 DIAGNOSIS — G894 Chronic pain syndrome: Secondary | ICD-10-CM

## 2017-02-28 DIAGNOSIS — M545 Low back pain, unspecified: Secondary | ICD-10-CM

## 2017-02-28 DIAGNOSIS — Z79899 Other long term (current) drug therapy: Secondary | ICD-10-CM | POA: Diagnosis not present

## 2017-02-28 DIAGNOSIS — E662 Morbid (severe) obesity with alveolar hypoventilation: Secondary | ICD-10-CM | POA: Diagnosis not present

## 2017-02-28 DIAGNOSIS — K5909 Other constipation: Secondary | ICD-10-CM | POA: Diagnosis not present

## 2017-02-28 DIAGNOSIS — M1711 Unilateral primary osteoarthritis, right knee: Secondary | ICD-10-CM | POA: Diagnosis not present

## 2017-02-28 DIAGNOSIS — T40605A Adverse effect of unspecified narcotics, initial encounter: Secondary | ICD-10-CM | POA: Diagnosis not present

## 2017-02-28 DIAGNOSIS — Z760 Encounter for issue of repeat prescription: Secondary | ICD-10-CM | POA: Diagnosis not present

## 2017-02-28 DIAGNOSIS — Z930 Tracheostomy status: Secondary | ICD-10-CM | POA: Diagnosis not present

## 2017-02-28 DIAGNOSIS — Z5181 Encounter for therapeutic drug level monitoring: Secondary | ICD-10-CM | POA: Diagnosis not present

## 2017-02-28 DIAGNOSIS — K5903 Drug induced constipation: Secondary | ICD-10-CM | POA: Diagnosis not present

## 2017-02-28 DIAGNOSIS — M1712 Unilateral primary osteoarthritis, left knee: Secondary | ICD-10-CM | POA: Diagnosis not present

## 2017-02-28 DIAGNOSIS — M25561 Pain in right knee: Secondary | ICD-10-CM | POA: Diagnosis not present

## 2017-02-28 MED ORDER — MORPHINE SULFATE ER 15 MG PO TBCR: 15.0000 mg | EXTENDED_RELEASE_TABLET | Freq: Two times a day (BID) | ORAL | 0 refills | Status: AC

## 2017-02-28 MED ORDER — OXYCODONE-ACETAMINOPHEN 7.5-325 MG PO TABS: 1.0000 | ORAL_TABLET | Freq: Three times a day (TID) | ORAL | 0 refills | Status: AC | PRN

## 2017-02-28 MED ORDER — OXYCODONE-ACETAMINOPHEN 7.5-325 MG PO TABS: 1.0000 | ORAL_TABLET | Freq: Three times a day (TID) | ORAL | 0 refills | Status: DC | PRN

## 2017-02-28 MED ORDER — MORPHINE SULFATE ER 15 MG PO TBCR: 15.0000 mg | EXTENDED_RELEASE_TABLET | Freq: Two times a day (BID) | ORAL | 0 refills | Status: DC

## 2017-02-28 NOTE — Progress Notes (Signed)
Subjective:    Patient ID: Blake Summers, male    DOB: 01/31/1946, 71 y.o.   MRN: [id number]  HPI: [name] is a 71year old male who returns for follow up appointment for chronic pain and medication refill. He states his pain is located in his lower back and bilateral  knees R>L. He rates his pain 7. His current exercise regime is performing chair stretching exercises with bands and walking with walker short distances in the home. He arrived to clinic in his motorized wheelchair.  Wearing bilateral lower extremities dressing and Velcro wraps,states he's going to [facility] bi-weekly.   Wife in room.  Last UDS was performed on 10/25/2016 it was consistent for prescribed medication, see note for details.    Pain Inventory Average Pain 6 Pain Right Now 7 My pain is sharp, dull and stabbing  In the last 24 hours, has pain interfered with the following? General activity 8 Relation with others 9 Enjoyment of life 9 What TIME of day is your pain at its worst? daytime Sleep (in general) Poor  Pain is worse with: walking and standing Pain improves with: rest, heat/ice, medication and injections Relief from Meds: 7  Mobility walk with assistance use a cane use a walker how many minutes can you walk? 0 ability to climb steps?  no do you drive?  yes use a wheelchair needs help with transfers transfers alone  Function retired I need assistance with the following:  dressing, bathing, toileting, meal prep, household duties and shopping  Neuro/Psych weakness numbness tingling trouble walking dizziness  Prior Studies Any changes since last visit?  no  Physicians involved in your care Any changes since last visit?  no   Family History  Problem Relation Age of Onset  . Asthma Mother   . Colon cancer Brother   . Heart disease Brother   . Heart disease Brother    Social History   Social History  . Marital status: Married    Spouse name:  N/A  . Number of children: N/A  . Years of education: N/A   Occupational History  . Retired Retired    Nature conservation officer   Social History Main Topics  . Smoking status: Former Smoker    Packs/day: 0.50    Years: 15.00    Types: Cigarettes    Quit date: 06/12/1988  . Smokeless tobacco: Never Used  . Alcohol use No  . Drug use: No  . Sexual activity: Not Asked   Other Topics Concern  . None   Social History Narrative   Diet is "good"   Exercise is limited by medical problems   Past Surgical History:  Procedure Laterality Date  . LITHOTRIPSY  2007  . TRACHEOSTOMY TUBE PLACEMENT N/A 07/14/2014   Procedure: TRACHEOSTOMY;  Surgeon: Darletta Moll, MD;  Location: [facility] OR;  Service: ENT;  Laterality: N/A;   Past Medical History:  Diagnosis Date  . ALLERGIC RHINITIS 07/08/2008  . BENIGN PROSTATIC HYPERTROPHY 07/08/2008  . CEREBROVASCULAR ACCIDENT, HX OF 07/08/2008  . DEGENERATIVE JOINT DISEASE 05/24/2009   R knee, end stage  . DIABETES MELLITUS, TYPE II 05/04/2007  . DIABETIC ULCER, LEFT LEG 08/23/2009  . Diarrhea 06/08/2013  . DM nephropathy/sclerosis   . Edema 10/28/2007  . 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 FATTY LIVER DISEASE 05/19/2008  . GERD 07/06/2010  . GLAUCOMA 07/08/2008  . HYPERLIPIDEMIA 07/08/2008  . HYPERTENSION 10/28/2007  . 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 Lumbar spondylosis   .  Morbid obesity (HCC)   . NEPHROLITHIASIS, HX OF 07/08/2008  . OBSTRUCTIVE SLEEP APNEA 03/06/2008  . VENOUS INSUFFICIENCY 03/08/2009   BP (!) 145/72   Pulse 73   SpO2 94% Comment: 2 L [place]  Opioid Risk Score:  0 Fall Risk Score:  `1  Depression screen PHQ 2/9  Depression screen [facility] 2/9 02/23/2016 06/28/2015 02/16/2015 02/04/2015 12/29/2014 12/08/2014 10/20/2014  Decreased Interest 0 0 0 0 0 0 0  Down, Depressed, Hopeless 0 0 0 0 0 0 0  PHQ - 2 Score 0 0 0 0 0 0 0  Altered sleeping - - - - - - 0  Tired, decreased energy - - - - - - 2  Change in appetite - - - - - - 0  Feeling bad or failure  about yourself  - - - - - - 0  Trouble concentrating - - - - - - 0  Moving slowly or fidgety/restless - - - - - - 0  Suicidal thoughts - - - - - - 0  PHQ-9 Score - - - - - - 2  Some recent data might be hidden    Review of Systems  HENT: Negative.   Eyes: Negative.   Respiratory: Negative.   Cardiovascular: Negative.   Gastrointestinal: Negative.   Endocrine: Negative.        High blood sugar  Genitourinary: Negative.   Musculoskeletal: Positive for gait problem.  Skin: Negative.   Allergic/Immunologic: Negative.   Neurological: Positive for dizziness, weakness and numbness.       Tingling  Hematological: Negative.   Psychiatric/Behavioral: Negative.   All other systems reviewed and are negative.      Objective:   Physical Exam  Constitutional: He is oriented to person, place, and time. He appears well-developed and well-nourished.  HENT:  Head: Normocephalic and atraumatic.  Neck: Normal range of motion. Neck supple.  Cardiovascular: Normal rate and regular rhythm.   Pulmonary/Chest: Effort normal and breath sounds normal.  Continuous 2 liters nasal cannula Trach with PMV  Musculoskeletal:  Normal Muscle Bulk and Muscle Testing Reveals: Upper Extremities: FFull ROM and Muscle Strength 5/5 Back without spinal tenderness noted Lower Extremities: Right: Decreased  ROM and Muscle Strength 5/5 Left Lower Extremity: Full ROM and Muscle Strength 5/5 Arrived in motorized wheelchair    Neurological: He is alert and oriented to person, place, and time.  Skin: Skin is warm and dry.  Psychiatric: He has a normal mood and affect.  Nursing note and vitals reviewed.         Assessment & Plan:  1. End-stage right knee osteoarthritis: Continue to use Lidocaine gel.  Refilled: MS contin 15 mg #60. One tablet every 12 hours and Percocet 7.5/325 mg # 90 pills--use every 8 hours as needed for pain #90. Second script's for the following month.02/28/2017 2. Morbid obesity with  generalized deconditioning: Continue to monitor 02/28/2017 3. Chronic low back pain/spondylosis: Continue HEP as tolerated. Continue MS contin with prn percocet. 02/28/2017 4. Narcotic induced constipation: Continue with Bowel Program: Senna and Miralax as prescribed. 02/28/2017 5. Acute on Chronic Respiratory Failure: Continuous Oxygen: S/P Tracheostomy/ PMV/ Vent at HS. Pulmonary Following. 02/28/2017 6. Peripheral Neuropathy: Continue Topamax. 02/28/2017  20 minutes of face to face patient care time was spent during this visit. All questions were encouraged and answered.  F/U in 2 months

## 2017-03-02 ENCOUNTER — Telehealth: Payer: Self-pay | Admitting: Registered Nurse

## 2017-03-02 NOTE — Telephone Encounter (Signed)
On the NCCSR was reviewed no conflict was seen on the [place] Controlled Substance Reporting System with multiple prescribers. Blake Summers has a signed narcotic contract with our office. If there were any discrepancies this would have been reported to his physician.

## 2017-03-09 ENCOUNTER — Ambulatory Visit (INDEPENDENT_AMBULATORY_CARE_PROVIDER_SITE_OTHER): Payer: Medicare Other | Admitting: General Practice

## 2017-03-09 DIAGNOSIS — Z23 Encounter for immunization: Secondary | ICD-10-CM | POA: Diagnosis not present

## 2017-03-09 DIAGNOSIS — Z7901 Long term (current) use of anticoagulants: Secondary | ICD-10-CM

## 2017-03-09 DIAGNOSIS — I4891 Unspecified atrial fibrillation: Secondary | ICD-10-CM

## 2017-03-09 DIAGNOSIS — Z5181 Encounter for therapeutic drug level monitoring: Secondary | ICD-10-CM

## 2017-03-09 LAB — POCT INR: INR: 2.3

## 2017-03-09 NOTE — Progress Notes (Signed)
I have reviewed and agree with the plan. 

## 2017-03-09 NOTE — Patient Instructions (Signed)
Pre visit review using our clinic review tool, if applicable. No additional management support is needed unless otherwise documented below in the visit note. 

## 2017-03-23 ENCOUNTER — Ambulatory Visit (INDEPENDENT_AMBULATORY_CARE_PROVIDER_SITE_OTHER): Payer: Medicare Other | Admitting: Endocrinology

## 2017-03-23 ENCOUNTER — Encounter: Payer: Self-pay | Admitting: Endocrinology

## 2017-03-23 ENCOUNTER — Telehealth: Payer: Self-pay | Admitting: General Practice

## 2017-03-23 VITALS — BP 116/62 | HR 63 | Wt 334.0 lb

## 2017-03-23 DIAGNOSIS — R0600 Dyspnea, unspecified: Secondary | ICD-10-CM

## 2017-03-23 DIAGNOSIS — Z794 Long term (current) use of insulin: Secondary | ICD-10-CM

## 2017-03-23 DIAGNOSIS — Z0000 Encounter for general adult medical examination without abnormal findings: Secondary | ICD-10-CM | POA: Diagnosis not present

## 2017-03-23 DIAGNOSIS — Z125 Encounter for screening for malignant neoplasm of prostate: Secondary | ICD-10-CM

## 2017-03-23 DIAGNOSIS — E1159 Type 2 diabetes mellitus with other circulatory complications: Secondary | ICD-10-CM | POA: Diagnosis not present

## 2017-03-23 LAB — CBC WITH DIFFERENTIAL/PLATELET
BASOS ABS: 0.1 10*3/uL (ref 0.0–0.1)
Basophils Relative: 0.4 % (ref 0.0–3.0)
EOS ABS: 0.2 10*3/uL (ref 0.0–0.7)
Eosinophils Relative: 1.8 % (ref 0.0–5.0)
HCT: 44.1 % (ref 39.0–52.0)
Hemoglobin: 14.3 g/dL (ref 13.0–17.0)
LYMPHS ABS: 0.5 10*3/uL — AB (ref 0.7–4.0)
Lymphocytes Relative: 3.6 % — ABNORMAL LOW (ref 12.0–46.0)
MCHC: 32.5 g/dL (ref 30.0–36.0)
MCV: 88.2 fl (ref 78.0–100.0)
MONO ABS: 0.8 10*3/uL (ref 0.1–1.0)
Monocytes Relative: 5.8 % (ref 3.0–12.0)
Neutro Abs: 11.5 10*3/uL — ABNORMAL HIGH (ref 1.4–7.7)
Platelets: 220 10*3/uL (ref 150.0–400.0)
RBC: 5 Mil/uL (ref 4.22–5.81)
RDW: 16.6 % — ABNORMAL HIGH (ref 11.5–15.5)
WBC: 13 10*3/uL — ABNORMAL HIGH (ref 4.0–10.5)

## 2017-03-23 LAB — BASIC METABOLIC PANEL
BUN: 15 mg/dL (ref 6–23)
CALCIUM: 7.8 mg/dL — AB (ref 8.4–10.5)
CO2: 32 meq/L (ref 19–32)
CREATININE: 0.81 mg/dL (ref 0.40–1.50)
Chloride: 101 mEq/L (ref 96–112)
GFR: 99.52 mL/min (ref >60.00)
GLUCOSE: 118 mg/dL — AB (ref 70–99)
Potassium: 3.8 mEq/L (ref 3.5–5.1)
SODIUM: 143 meq/L (ref 135–145)

## 2017-03-23 LAB — HEPATIC FUNCTION PANEL
ALBUMIN: 3.5 g/dL (ref 3.5–5.2)
ALT: 47 U/L (ref 0–53)
AST: 24 U/L (ref 0–37)
Alkaline Phosphatase: 73 U/L (ref 39–117)
Bilirubin, Direct: 0.1 mg/dL (ref 0.0–0.3)
Total Bilirubin: 0.5 mg/dL (ref 0.2–1.2)
Total Protein: 6 g/dL (ref 6.0–8.3)

## 2017-03-23 LAB — LIPID PANEL
CHOLESTEROL: 136 mg/dL (ref 0–200)
HDL: 35.6 mg/dL — AB (ref >39.00)
LDL Cholesterol: 66 mg/dL (ref 0–99)
NonHDL: 100.12
TRIGLYCERIDES: 170 mg/dL — AB (ref 0.0–149.0)
Total CHOL/HDL Ratio: 4
VLDL: 34 mg/dL (ref 0.0–40.0)

## 2017-03-23 LAB — TSH: TSH: 0.98 u[IU]/mL (ref 0.35–4.50)

## 2017-03-23 LAB — POCT GLYCOSYLATED HEMOGLOBIN (HGB A1C): HEMOGLOBIN A1C: 7.1

## 2017-03-23 LAB — PSA: PSA: 0.41 ng/mL (ref 0.10–4.00)

## 2017-03-23 MED ORDER — FREESTYLE LIBRE 14 DAY SENSOR MISC: 1.0000 | 3 refills | Status: AC

## 2017-03-23 MED ORDER — CLOTRIMAZOLE-BETAMETHASONE 1-0.05 % EX CREA: 1.0000 "application " | TOPICAL_CREAM | Freq: Two times a day (BID) | CUTANEOUS | 5 refills | Status: AC

## 2017-03-23 MED ORDER — METRONIDAZOLE 250 MG PO TABS: 250.0000 mg | ORAL_TABLET | Freq: Three times a day (TID) | ORAL | 0 refills | Status: AC

## 2017-03-23 MED ORDER — FREESTYLE LIBRE 14 DAY READER DEVI: 1.0000 | Freq: Once | 0 refills | Status: AC

## 2017-03-23 NOTE — Telephone Encounter (Signed)
Tried to reach patient on both numbers and no answer and no way to leave message.  I will try to contact patient Monday, 10/15.

## 2017-03-23 NOTE — Progress Notes (Signed)
Subjective:    Patient ID: Blake Summers, male    DOB: 07/24/1945, 71 y.o.   MRN: [id number]  HPI Pt is here for regular wellness examination, and is feeling pretty well in general, and says chronic med probs are stable, except as noted below Past Medical History:  Diagnosis Date  . ALLERGIC RHINITIS 07/08/2008  . BENIGN PROSTATIC HYPERTROPHY 07/08/2008  . CEREBROVASCULAR ACCIDENT, HX OF 07/08/2008  . DEGENERATIVE JOINT DISEASE 05/24/2009   R knee, end stage  . DIABETES MELLITUS, TYPE II 05/04/2007  . DIABETIC ULCER, LEFT LEG 08/23/2009  . Diarrhea 06/08/2013  . DM nephropathy/sclerosis   . Edema 10/28/2007  . 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 FATTY LIVER DISEASE 05/19/2008  . GERD 07/06/2010  . GLAUCOMA 07/08/2008  . HYPERLIPIDEMIA 07/08/2008  . HYPERTENSION 10/28/2007  . 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 Lumbar spondylosis   . Morbid obesity (HCC)   . NEPHROLITHIASIS, HX OF 07/08/2008  . OBSTRUCTIVE SLEEP APNEA 03/06/2008  . VENOUS INSUFFICIENCY 03/08/2009    Past Surgical History:  Procedure Laterality Date  . LITHOTRIPSY  2007  . TRACHEOSTOMY TUBE PLACEMENT N/A 07/14/2014   Procedure: TRACHEOSTOMY;  Surgeon: Darletta Moll, MD;  Location: [facility] OR;  Service: ENT;  Laterality: N/A;    Social History   Social History  . Marital status: Married    Spouse name: N/A  . Number of children: N/A  . Years of education: N/A   Occupational History  . Retired Retired    Nature conservation officer   Social History Main Topics  . Smoking status: Former Smoker    Packs/day: 0.50    Years: 15.00    Types: Cigarettes    Quit date: 06/12/1988  . Smokeless tobacco: Never Used  . Alcohol use No  . Drug use: No  . Sexual activity: Not on file   Other Topics Concern  . Not on file   Social History Narrative   Diet is "good"   Exercise is limited by medical problems    Current Outpatient Prescriptions on File Prior to Visit  Medication Sig Dispense Refill  . brimonidine (ALPHAGAN) 0.15 %  ophthalmic solution Place 1 drop into both eyes 2 (two) times daily.     . CONSTULOSE 10 GM/15ML solution TAKE (3 TABLESPOONFULS) TWICE DAILY 960 mL 11  . DEXILANT 60 MG capsule TAKE 1 CAPSULE BY MOUTH DAILY 30 capsule 11  . dorzolamide-timolol (COSOPT) 22.3-6.8 MG/ML ophthalmic solution Place 1 drop into both eyes 2 (two) times daily.     . famotidine (PEPCID) 20 MG tablet TAKE 1 TABLET BY MOUTH 2 TIMES A DAY 60 tablet 3  . furosemide (LASIX) 80 MG tablet TAKE 1 TABLET BY MOUTH 2 TIMES A DAY 60 tablet 1  . insulin aspart (NOVOLOG FLEXPEN) 100 UNIT/ML FlexPen 3 times a day (just before each meal) 30-15-30 units. 30 mL 4  . Insulin Glargine (LANTUS SOLOSTAR) 100 UNIT/ML Solostar Pen Inject 50 Units into the skin at bedtime. 30 mL 2  . latanoprost (XALATAN) 0.005 % ophthalmic solution Place 1 drop into both eyes at bedtime. 2.5 mL 4  . MELATONIN PO Take by mouth.    . morphine (MS CONTIN) 15 MG 12 hr tablet Take 1 tablet (15 mg total) by mouth every 12 (twelve) hours. 60 tablet 0  . NOVOFINE 30G X 8 MM MISC USE 4 TIMES A DAY 120 each 11  . ONETOUCH VERIO test strip CHECK BLOOD SUGAR 3 TIMES DAILY 100 each 5  . oxyCODONE-acetaminophen (  PERCOCET) 7.5-325 MG tablet Take 1 tablet by mouth every 8 (eight) hours as needed (pain). 90 tablet 0  . OXYGEN Oxygen 3-4 lpm  Bipap/Vent  with sleep    . polyethylene glycol powder (GLYCOLAX/MIRALAX) powder MIX 17 GRAMS (UP TO LINE IN CAP) IN 8 OUNCES OF NON-CARBONATED BEVERAGE (WATER/JUICE) AND DRINK ONCE DAILY 527 g 11  . pravastatin (PRAVACHOL) 40 MG tablet TAKE 1 TABLET BY MOUTH EVERY DAY AT 6PM FOR CHOLESTEROL 90 tablet 3  . SENNA-TABS 8.6 MG tablet TAKE 1 TABLET BY MOUTH DAILY AS NEEDED FOR MILD CONSTIPATION 50 tablet 11  . tamsulosin (FLOMAX) 0.4 MG CAPS capsule TAKE 1 CAPSULE BY MOUTH DAILY 30 capsule 11  . topiramate (TOPAMAX) 50 MG tablet TAKE 1 TABLET BY MOUTH 2 TIMES A DAY 60 tablet 1  . triamcinolone acetonide 40 MG/ML SUSP 40 mg, mupirocin  cream 2 % CREA 15 g Apply 1 application topically.    . warfarin (COUMADIN) 6 MG tablet TAKE UP TO 2 TABLETS DAILY OR AS DIRECTED BY THE COUMADIN CLINIC 60 tablet 3  . aspirin EC 81 MG tablet Take 81 mg by mouth daily.    Marland Kitchen lidocaine (XYLOCAINE) 2 % jelly Apply 1 application topically 3 (three) times daily. As needed for pain (Patient not taking: Reported on 03/23/2017) 40 mL 11  . lisinopril (PRINIVIL,ZESTRIL) 10 MG tablet Take 10 mg by mouth daily.    Marland Kitchen torsemide (DEMADEX) 20 MG tablet Take 20 mg by mouth daily.     No current facility-administered medications on file prior to visit.     Allergies  Allergen Reactions  . Movantik [Naloxegol] Nausea And Vomiting  . Neosporin [Neomycin-Polymyxin-Gramicidin] Other (See Comments)    unknown    Family History  Problem Relation Age of Onset  . Asthma Mother   . Colon cancer Brother   . Heart disease Brother   . Heart disease Brother     BP 116/62   Pulse 63   Wt (!) 334 lb (151.5 kg)   SpO2 94%   BMI 53.91 kg/m     Review of Systems Denies fever, visual loss, hearing loss, chest pain, depression, cold intolerance, hematuria, syncope, numbness, allergy sxs, and rash. No change in chronic fatigue, back pain, easy bruising, or sob.      Objective:   Physical Exam VS: see vs page GEN: no distress.  Morbid obesity.  Has 02 on.  In wheelchair.  HEAD: head: no deformity eyes: no periorbital swelling, no proptosis external nose and ears are normal mouth: no lesion seen NECK: supple, thyroid is not enlarged. Tracheostomy is present, and 02 is on CHEST WALL: no deformity LUNGS: clear to auscultation CV: reg rate and rhythm, no murmur ABD: abdomen is soft, nontender.  no hepatosplenomegaly.  not distended.  no hernia MUSCULOSKELETAL: muscle bulk and strength are grossly normal.  no obvious joint swelling.  gait is normal and steady EXTEMITIES: 2+ bilat leg edema PULSES: no carotid bruit NEURO:  cn 2-12 grossly intact.   readily  moves all 4's.  sensation is intact to touch on the feet SKIN:  Normal texture and temperature.  No rash or suspicious lesion is visible.   NODES:  None palpable at the neck PSYCH: alert, well-oriented.  Does not appear anxious nor depressed.        Assessment & Plan:  Wellness visit today, with problems stable, except as noted.   SEPARATE EVALUATION FOLLOWS--EACH PROBLEM HERE IS NEW, NOT RESPONDING TO TREATMENT, OR POSES SIGNIFICANT RISK  TO THE PATIENT'S HEALTH:  HISTORY OF THE PRESENT ILLNESS: Pt returns for f/u of diabetes mellitus: DM type: Insulin-requiring type 2 Dx'ed: 1990 Complications: polyneuropathy, CAD, CVA, and foot ulcer.   Therapy: insulin since 1999 DKA: never Severe hypoglycemia: never Pancreatitis: never Other: he takes multiple daily injections; he was turned down for weight-loss surgery, due to comorbidities.   Interval history: He says cbg's are well-controlled.  He has 1 week of mild diarrhea (since recent abx), but no assoc BRBPR.  He has ongoing ulcers of the feet PAST MEDICAL HISTORY: Past Medical History:  Diagnosis Date  . ALLERGIC RHINITIS 07/08/2008  . BENIGN PROSTATIC HYPERTROPHY 07/08/2008  . CEREBROVASCULAR ACCIDENT, HX OF 07/08/2008  . DEGENERATIVE JOINT DISEASE 05/24/2009   R knee, end stage  . DIABETES MELLITUS, TYPE II 05/04/2007  . DIABETIC ULCER, LEFT LEG 08/23/2009  . Diarrhea 06/08/2013  . DM nephropathy/sclerosis   . Edema 10/28/2007  . 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 FATTY LIVER DISEASE 05/19/2008  . GERD 07/06/2010  . GLAUCOMA 07/08/2008  . HYPERLIPIDEMIA 07/08/2008  . HYPERTENSION 10/28/2007  . 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 Lumbar spondylosis   . Morbid obesity (HCC)   . NEPHROLITHIASIS, HX OF 07/08/2008  . OBSTRUCTIVE SLEEP APNEA 03/06/2008  . VENOUS INSUFFICIENCY 03/08/2009    Past Surgical History:  Procedure Laterality Date  . LITHOTRIPSY  2007  . TRACHEOSTOMY TUBE PLACEMENT N/A 07/14/2014   Procedure: TRACHEOSTOMY;   Surgeon: Darletta Moll, MD;  Location: [facility] OR;  Service: ENT;  Laterality: N/A;    Social History   Social History  . Marital status: Married    Spouse name: N/A  . Number of children: N/A  . Years of education: N/A   Occupational History  . Retired Retired    Nature conservation officer   Social History Main Topics  . Smoking status: Former Smoker    Packs/day: 0.50    Years: 15.00    Types: Cigarettes    Quit date: 06/12/1988  . Smokeless tobacco: Never Used  . Alcohol use No  . Drug use: No  . Sexual activity: Not on file   Other Topics Concern  . Not on file   Social History Narrative   Diet is "good"   Exercise is limited by medical problems    Current Outpatient Prescriptions on File Prior to Visit  Medication Sig Dispense Refill  . brimonidine (ALPHAGAN) 0.15 % ophthalmic solution Place 1 drop into both eyes 2 (two) times daily.     . CONSTULOSE 10 GM/15ML solution TAKE (3 TABLESPOONFULS) TWICE DAILY 960 mL 11  . DEXILANT 60 MG capsule TAKE 1 CAPSULE BY MOUTH DAILY 30 capsule 11  . dorzolamide-timolol (COSOPT) 22.3-6.8 MG/ML ophthalmic solution Place 1 drop into both eyes 2 (two) times daily.     . famotidine (PEPCID) 20 MG tablet TAKE 1 TABLET BY MOUTH 2 TIMES A DAY 60 tablet 3  . furosemide (LASIX) 80 MG tablet TAKE 1 TABLET BY MOUTH 2 TIMES A DAY 60 tablet 1  . insulin aspart (NOVOLOG FLEXPEN) 100 UNIT/ML FlexPen 3 times a day (just before each meal) 30-15-30 units. 30 mL 4  . Insulin Glargine (LANTUS SOLOSTAR) 100 UNIT/ML Solostar Pen Inject 50 Units into the skin at bedtime. 30 mL 2  . latanoprost (XALATAN) 0.005 % ophthalmic solution Place 1 drop into both eyes at bedtime. 2.5 mL 4  . MELATONIN PO Take by mouth.    . morphine (MS CONTIN) 15 MG 12 hr tablet Take 1 tablet (  15 mg total) by mouth every 12 (twelve) hours. 60 tablet 0  . NOVOFINE 30G X 8 MM MISC USE 4 TIMES A DAY 120 each 11  . ONETOUCH VERIO test strip CHECK BLOOD SUGAR 3 TIMES DAILY 100 each 5  .  oxyCODONE-acetaminophen (PERCOCET) 7.5-325 MG tablet Take 1 tablet by mouth every 8 (eight) hours as needed (pain). 90 tablet 0  . OXYGEN Oxygen 3-4 lpm  Bipap/Vent  with sleep    . polyethylene glycol powder (GLYCOLAX/MIRALAX) powder MIX 17 GRAMS (UP TO LINE IN CAP) IN 8 OUNCES OF NON-CARBONATED BEVERAGE (WATER/JUICE) AND DRINK ONCE DAILY 527 g 11  . pravastatin (PRAVACHOL) 40 MG tablet TAKE 1 TABLET BY MOUTH EVERY DAY AT 6PM FOR CHOLESTEROL 90 tablet 3  . SENNA-TABS 8.6 MG tablet TAKE 1 TABLET BY MOUTH DAILY AS NEEDED FOR MILD CONSTIPATION 50 tablet 11  . tamsulosin (FLOMAX) 0.4 MG CAPS capsule TAKE 1 CAPSULE BY MOUTH DAILY 30 capsule 11  . topiramate (TOPAMAX) 50 MG tablet TAKE 1 TABLET BY MOUTH 2 TIMES A DAY 60 tablet 1  . triamcinolone acetonide 40 MG/ML SUSP 40 mg, mupirocin cream 2 % CREA 15 g Apply 1 application topically.    . warfarin (COUMADIN) 6 MG tablet TAKE UP TO 2 TABLETS DAILY OR AS DIRECTED BY THE COUMADIN CLINIC 60 tablet 3  . aspirin EC 81 MG tablet Take 81 mg by mouth daily.    Marland Kitchen lidocaine (XYLOCAINE) 2 % jelly Apply 1 application topically 3 (three) times daily. As needed for pain (Patient not taking: Reported on 03/23/2017) 40 mL 11  . lisinopril (PRINIVIL,ZESTRIL) 10 MG tablet Take 10 mg by mouth daily.    Marland Kitchen torsemide (DEMADEX) 20 MG tablet Take 20 mg by mouth daily.     No current facility-administered medications on file prior to visit.     Allergies  Allergen Reactions  . Movantik [Naloxegol] Nausea And Vomiting  . Neosporin [Neomycin-Polymyxin-Gramicidin] Other (See Comments)    unknown    Family History  Problem Relation Age of Onset  . Asthma Mother   . Colon cancer Brother   . Heart disease Brother   . Heart disease Brother     BP 116/62   Pulse 63   Wt (!) 334 lb (151.5 kg)   SpO2 94%   BMI 53.91 kg/m   REVIEW OF SYSTEMS: He denies hypoglycemia. He reports weight gain.   PHYSICAL EXAMINATION: VITAL SIGNS:  See vs page GENERAL: no  distress Feet are bandaged (sees wound care in [place]).   Skin: red rash at the intertrigenous areas of the abdomen.   LAB/XRAY RESULTS: A1c=7.1% I personally reviewed electrocardiogram tracing (today):  Indication: DM Impression: AF.  No MI.  Low voltage Compared to 2017: no significant change IMPRESSION: Insulin-requiring type 2 DM, with CAD: well-controlled.  Diarrhea, new, ? C diff. AF: there is an interaction between flagyl and coumadin.  PLAN: I rx'ed flagyl Our office has called coumadin clinic, to advise then we are prescribing flagyl

## 2017-03-23 NOTE — Patient Instructions (Addendum)
good diet significantly improves the control of your diabetes.  please let me know if you wish to be referred to a dietician.  high blood sugar is very risky to your health.  you should see an eye doctor and dentist every year.  It is very important to get all recommended vaccinations.  Controlling your blood pressure and cholesterol drastically reduces the damage diabetes does to your body.  Those who smoke should quit.  Please discuss these with your doctor.  Please consider these measures for your health:  minimize alcohol.  Do not use tobacco products.  Have a colonoscopy at least every 10 years from age 1.  Keep firearms safely stored.  Always use seat belts.  have working smoke alarms in your home.  See an eye doctor and dentist regularly.  Never drive under the influence of alcohol or drugs (including prescription drugs).  Those with fair skin should take precautions against the sun, and should carefully examine their skin once per month, for any new or changed moles. blood tests are requested for you today.  We'll let you know about the results.  Please continue the same insulin.  I have sent a prescription to your pharmacy, for the "freestyle libre" device.  Call if you need help with it.   Please come back for a follow-up appointment in 3-4 months.

## 2017-04-03 ENCOUNTER — Ambulatory Visit: Payer: Self-pay | Admitting: General Practice

## 2017-04-03 ENCOUNTER — Telehealth: Payer: Self-pay | Admitting: Endocrinology

## 2017-04-03 NOTE — Telephone Encounter (Signed)
Patient passed away Monday at 3am on 07/08/16.

## 2017-04-06 ENCOUNTER — Ambulatory Visit: Payer: Medicare Other

## 2017-04-12 ENCOUNTER — Telehealth: Payer: Self-pay | Admitting: Endocrinology

## 2017-04-12 NOTE — Telephone Encounter (Signed)
Patient has passed away.

## 2017-04-12 DEATH — deceased

## 2017-04-25 ENCOUNTER — Ambulatory Visit: Payer: Medicare Other | Admitting: Registered Nurse

## 2017-04-30 IMAGING — CR DG CHEST 2V
2 series · 2 of 2 positions shown · non-contrast
Comparison: 03/01/2015.

CLINICAL DATA: Low oxygen saturation.

EXAM:
CHEST  2 VIEW

[view not recorded (1 of 2)]
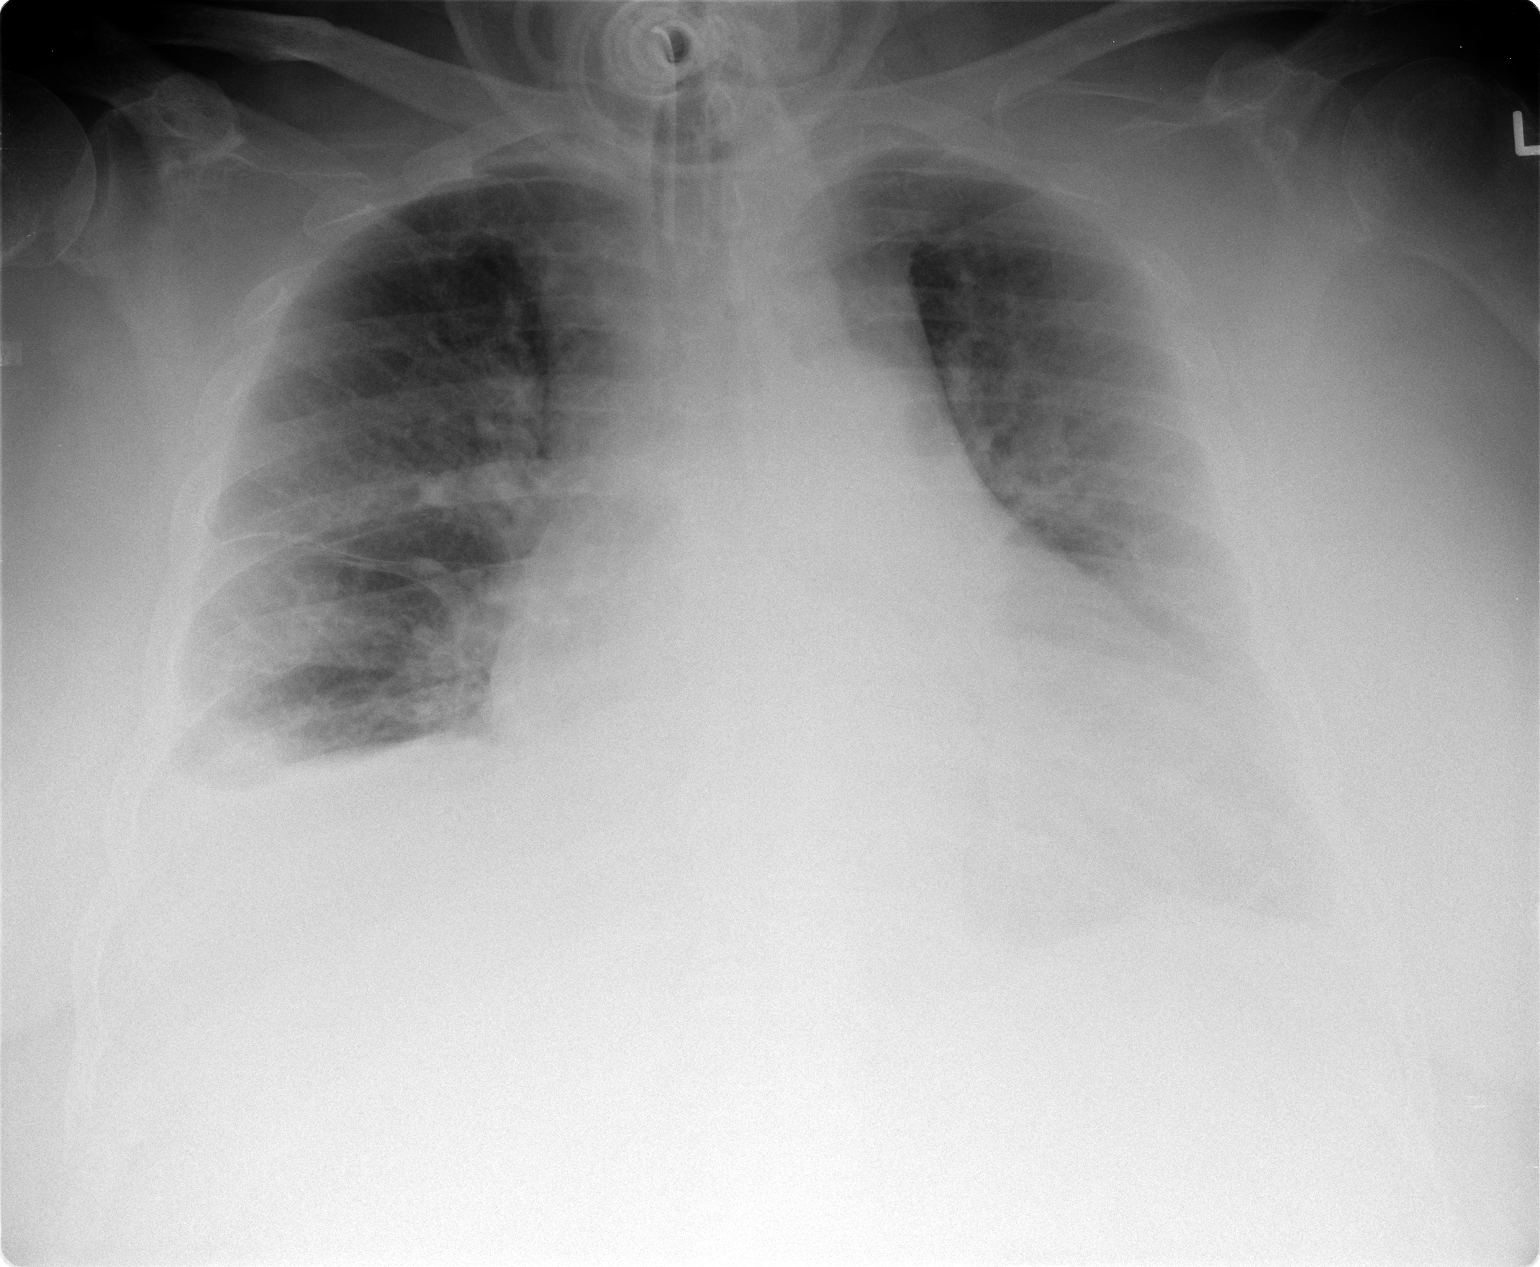

[view not recorded (2 of 2)]
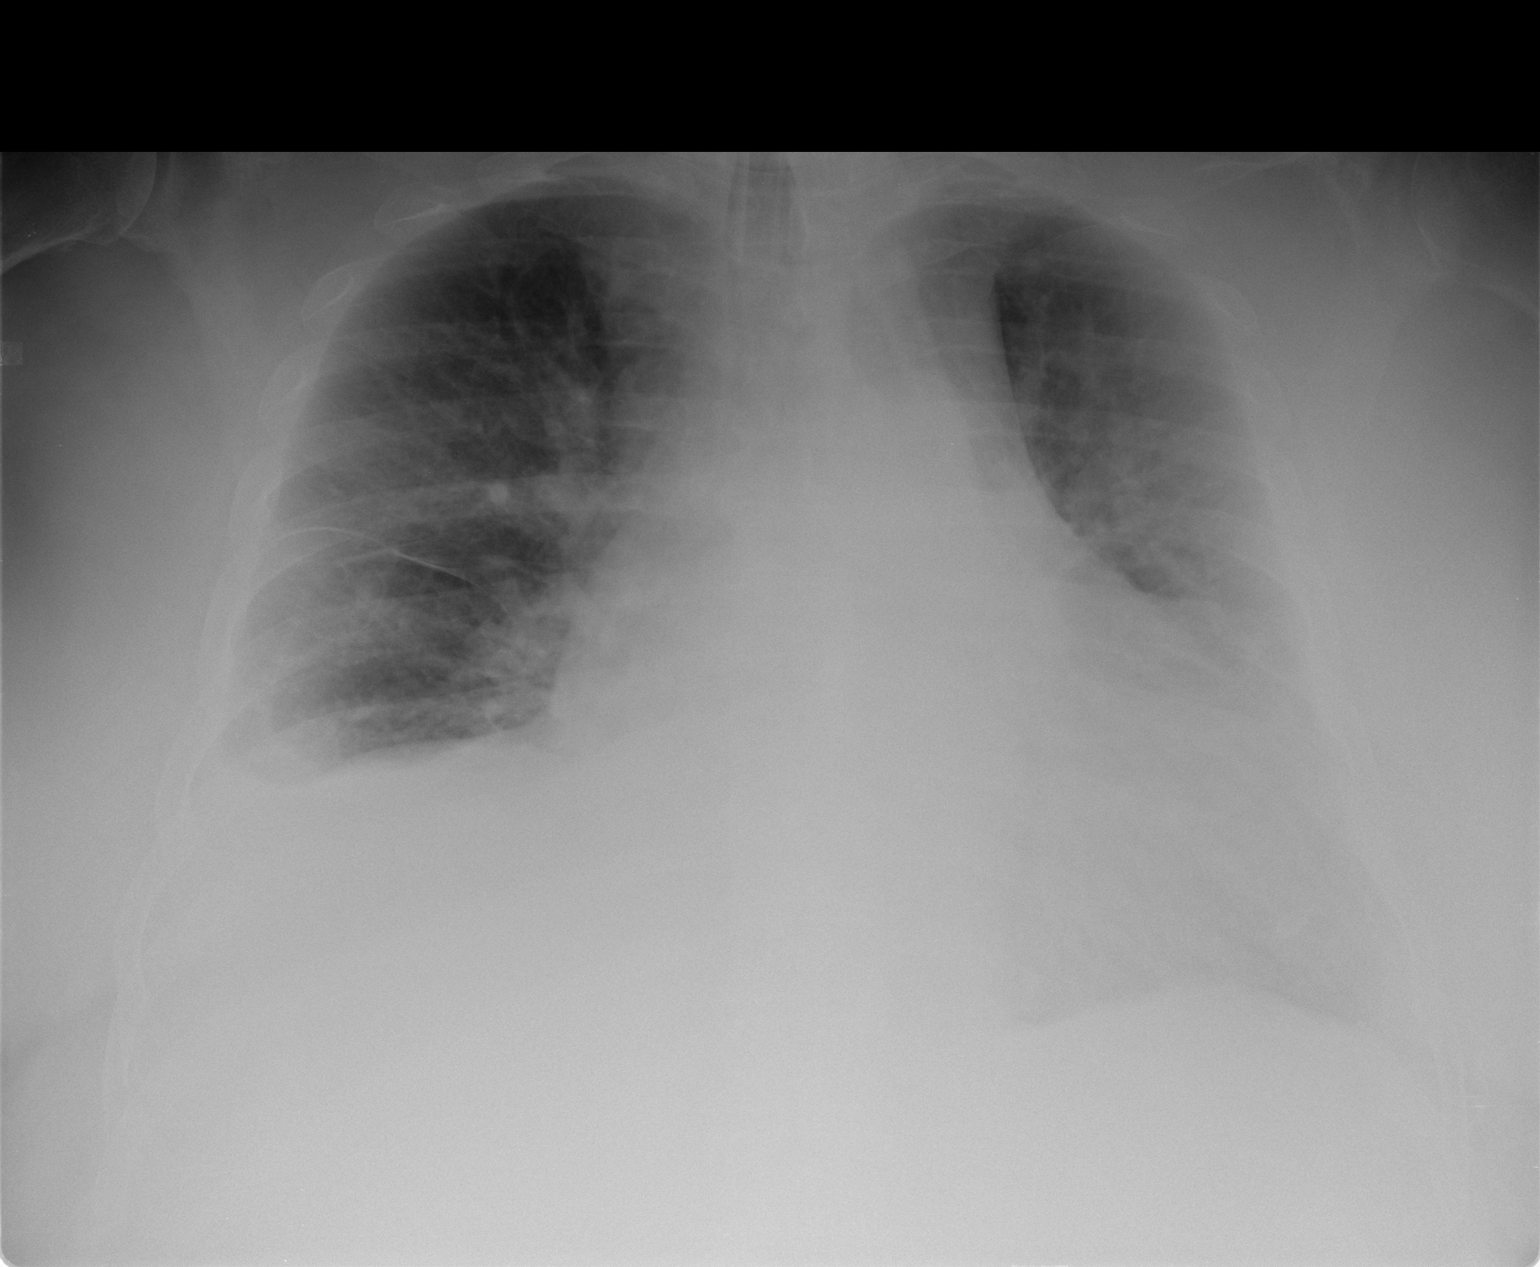

[2 of 2 positions shown; findings below may reference images not displayed]

FINDINGS: Tracheostomy tube noted in good anatomic position. Cardiomegaly with
mild pulmonary vascular congestion. Mild pulmonary interstitial
prominence. Findings consistent mild congestive heart failure. Small
pleural effusions. Degree of congestive heart failure is improved
from prior chest x-ray of 03/01/2015 .
IMPRESSION: 1. Tracheostomy tube in good anatomic position.
2. Mild congestive heart failure with mild pulmonary interstitial
edema and small pleural effusions. Degree of congestive heart
failure has improved from prior chest x-ray of 03/01/2015 .

## 2017-05-09 ENCOUNTER — Telehealth: Payer: Self-pay | Admitting: Endocrinology

## 2017-05-09 NOTE — Telephone Encounter (Signed)
PT DECEASED ON March 11, 2017 PER SPOUSE LINDA Pienta. Spouse brought an unofficial copy of the death certificate. Please inform Dr Everardo AllEllison and Please forward to update account status.
